# Patient Record
Sex: Female | Born: 1965 | Race: White | Hispanic: No | Marital: Married | Smoking: Former smoker
Health system: Southern US, Community
[De-identification: ages and names within clinical notes are randomized; demographics above are authoritative.]

## PROBLEM LIST (undated history)

## (undated) DIAGNOSIS — F419 Anxiety disorder, unspecified: Secondary | ICD-10-CM

## (undated) DIAGNOSIS — F329 Major depressive disorder, single episode, unspecified: Secondary | ICD-10-CM

## (undated) DIAGNOSIS — F32A Depression, unspecified: Secondary | ICD-10-CM

---

## 2002-11-03 HISTORY — PX: TUBAL LIGATION: SHX77

## 2002-11-26 ENCOUNTER — Ambulatory Visit (HOSPITAL_COMMUNITY): Admission: RE | Admit: 2002-11-26 | Discharge: 2002-11-26 | Payer: Self-pay | Admitting: Internal Medicine

## 2002-11-26 ENCOUNTER — Encounter: Payer: Self-pay | Admitting: Internal Medicine

## 2012-04-04 HISTORY — PX: OTHER SURGICAL HISTORY: SHX169

## 2012-04-28 ENCOUNTER — Inpatient Hospital Stay: Payer: Self-pay | Admitting: Orthopedic Surgery

## 2012-04-28 ENCOUNTER — Ambulatory Visit: Payer: Self-pay | Admitting: Orthopedic Surgery

## 2012-04-28 LAB — CBC
HCT: 39.8 % (ref 35.0–47.0)
MCH: 34.1 pg — ABNORMAL HIGH (ref 26.0–34.0)
RBC: 3.89 10*6/uL (ref 3.80–5.20)
WBC: 6.6 10*3/uL (ref 3.6–11.0)

## 2012-04-28 LAB — BASIC METABOLIC PANEL
BUN: 6 mg/dL — ABNORMAL LOW (ref 7–18)
Creatinine: 0.59 mg/dL — ABNORMAL LOW (ref 0.60–1.30)
EGFR (Non-African Amer.): >60
Glucose: 100 mg/dL — ABNORMAL HIGH (ref 65–99)
Potassium: 3.9 mmol/L (ref 3.5–5.1)
Sodium: 136 mmol/L (ref 136–145)

## 2012-04-28 IMAGING — CR RIGHT ANKLE - 2 VIEW
1 series · 2 of 2 positions shown · non-contrast
Comparison: none

REASON FOR EXAM: open tib fx
COMMENTS:

[Series 1: ap · 0.17mm/px · 2 of 2 slices shown]
[im 1/2]
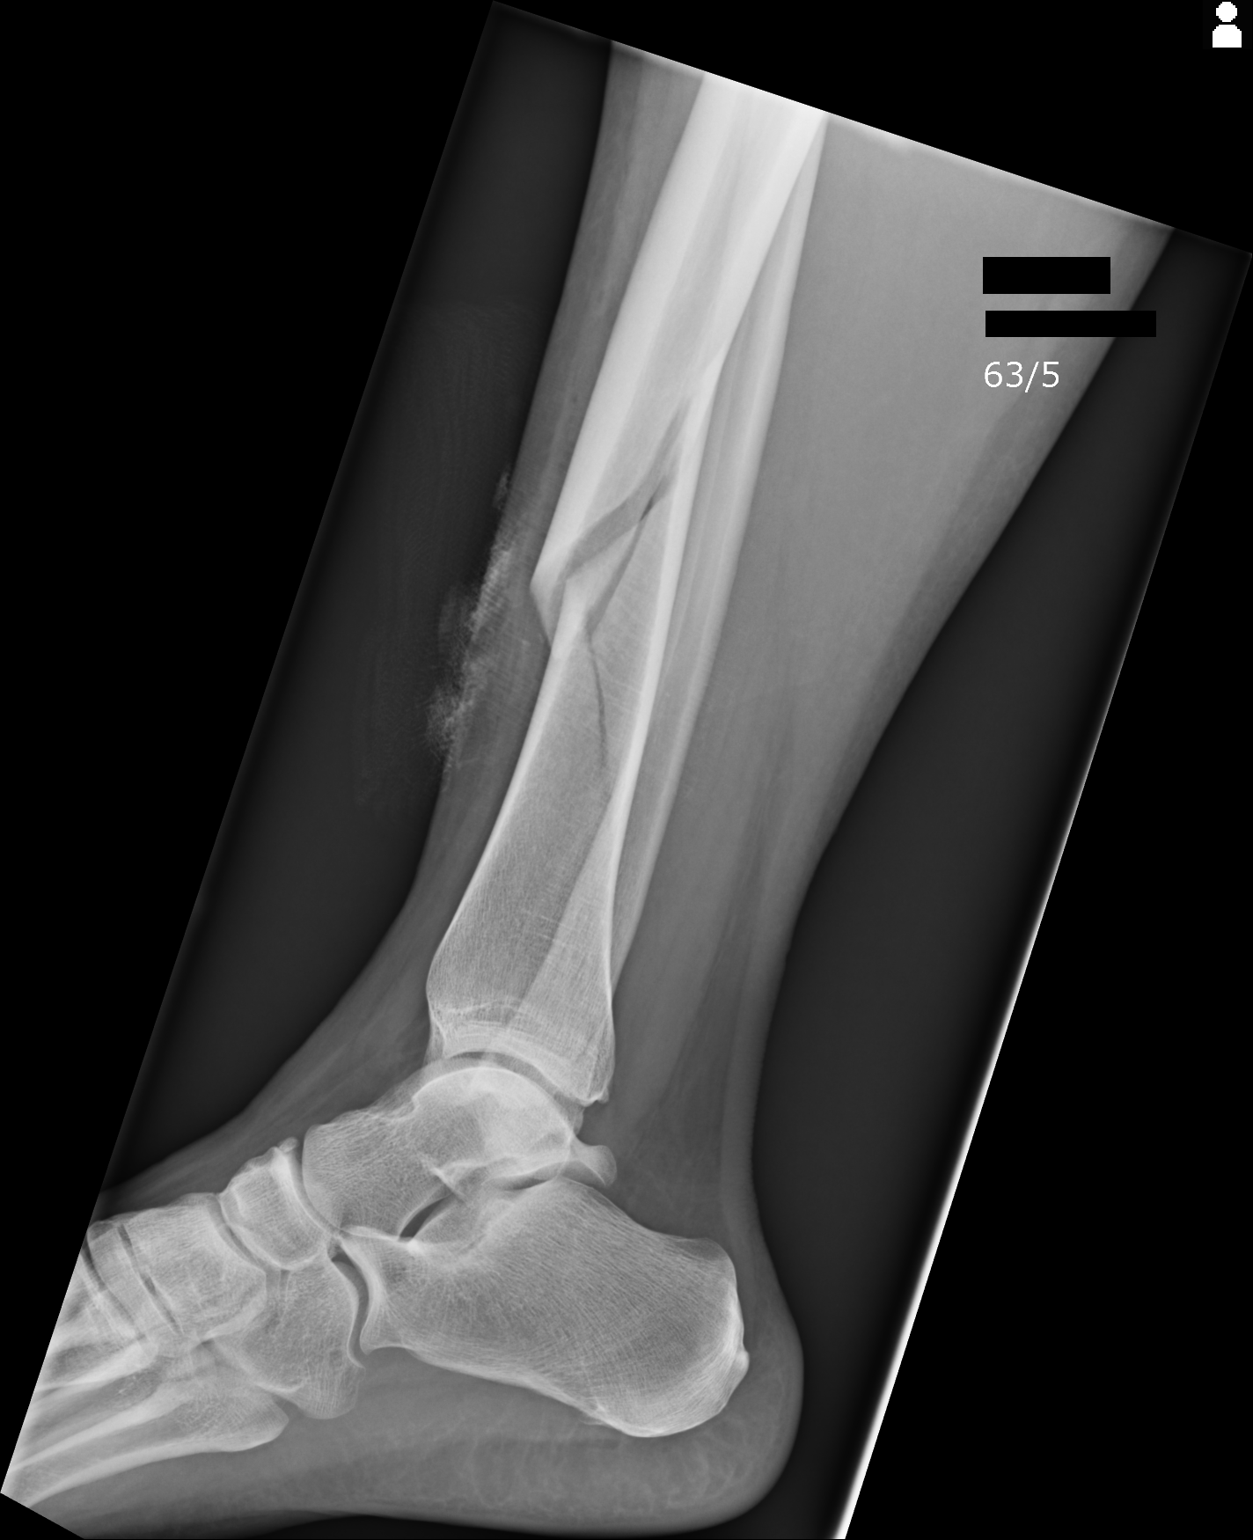
[im 2/2]
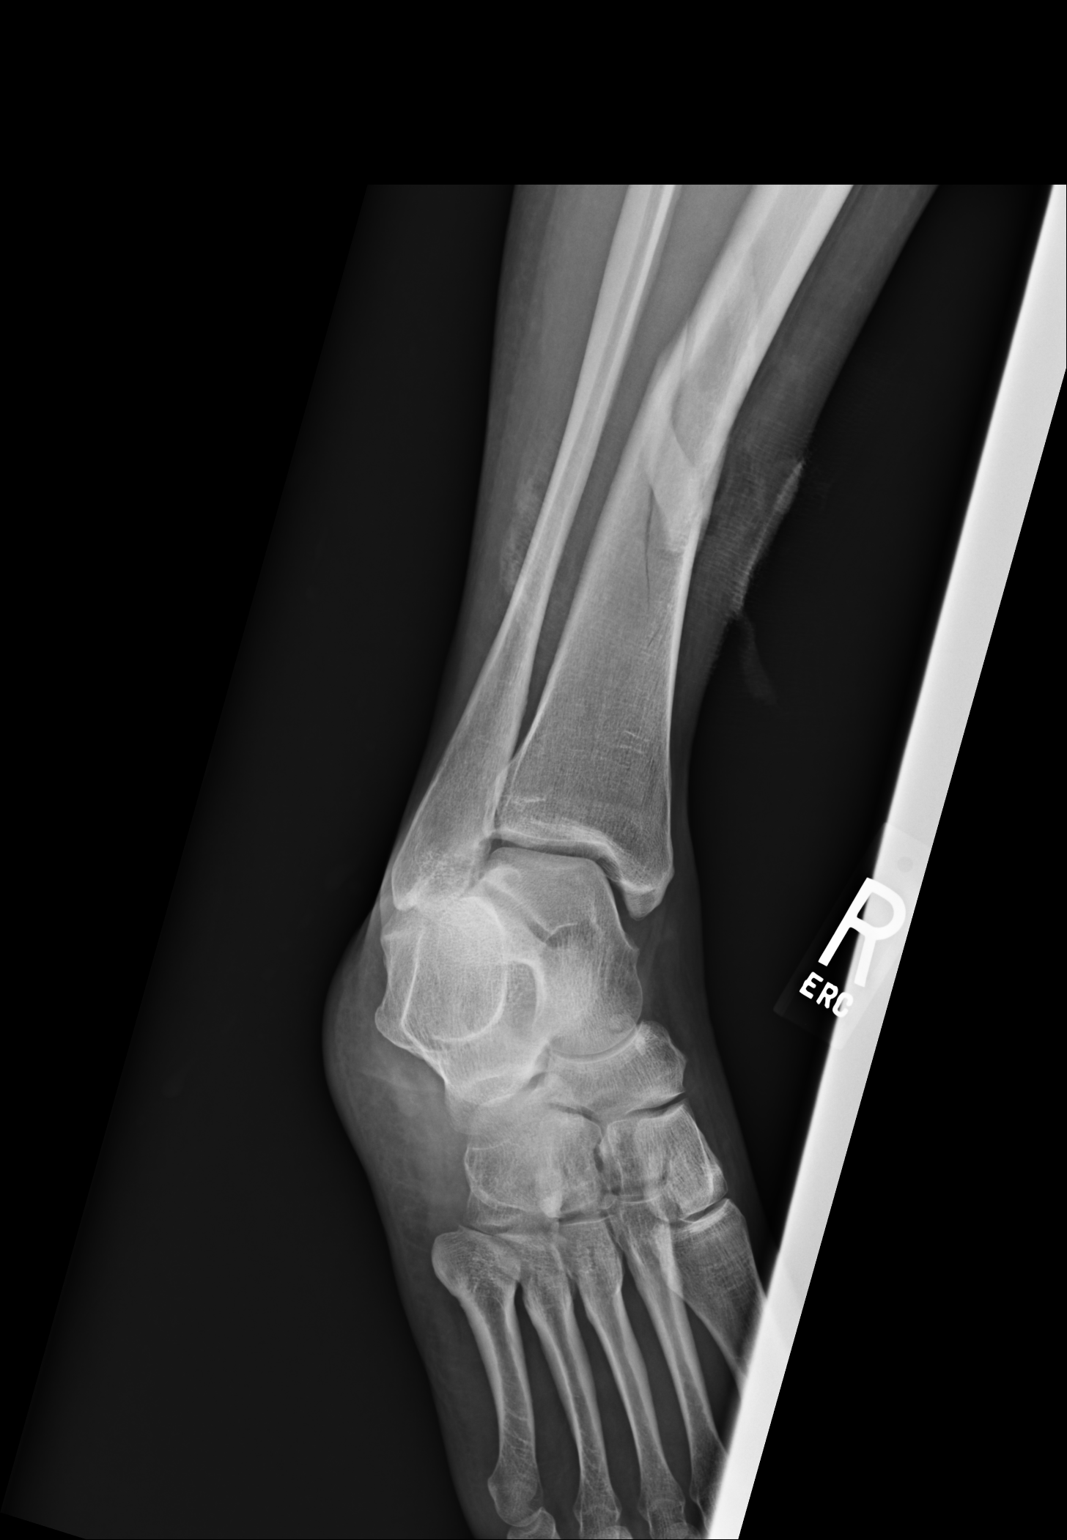

[2 of 2 positions shown; findings below may reference images not displayed]

PROCEDURE:     DXR - DXR ANKLE RIGHT AP AND LATERAL  - [DATE]  [DATE]

RESULT:     There is a spiral fracture in the distal third of the right
tibia which appears to extend into the distal tibia possibly to the
articular surface marginally on the lateral view. There is no dislocation.
There is slight lateral angulation. There is posterior displacement at the
fracture site with slight overriding.
IMPRESSION: Complex spiral fracture in the distal right tibia.

[REDACTED]

## 2012-04-28 IMAGING — CR RIGHT TIBIA AND FIBULA - 2 VIEW
1 series · 2 of 2 positions shown · non-contrast
Comparison: none

REASON FOR EXAM: post reduction
COMMENTS:   Bedside (portable):Y

[Series 1: ap · 0.17mm/px · 2 of 2 slices shown]
[im 1/2]
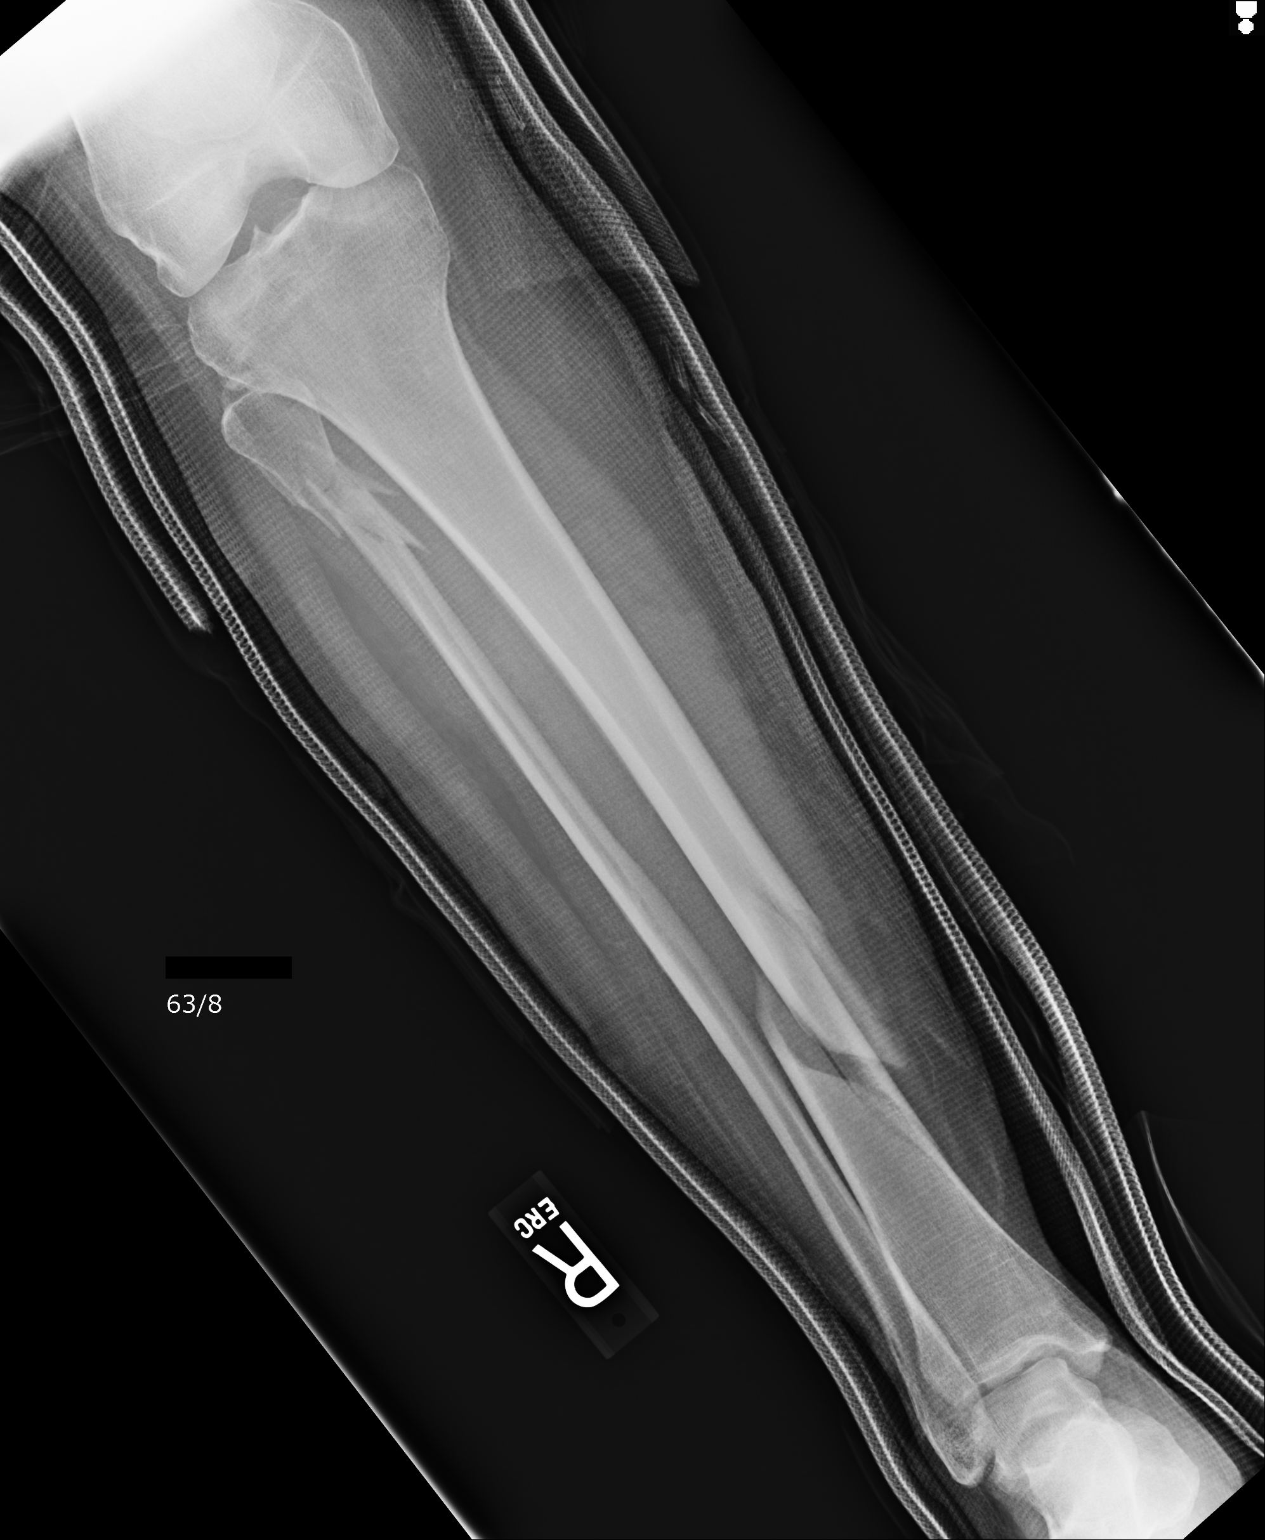
[im 2/2]
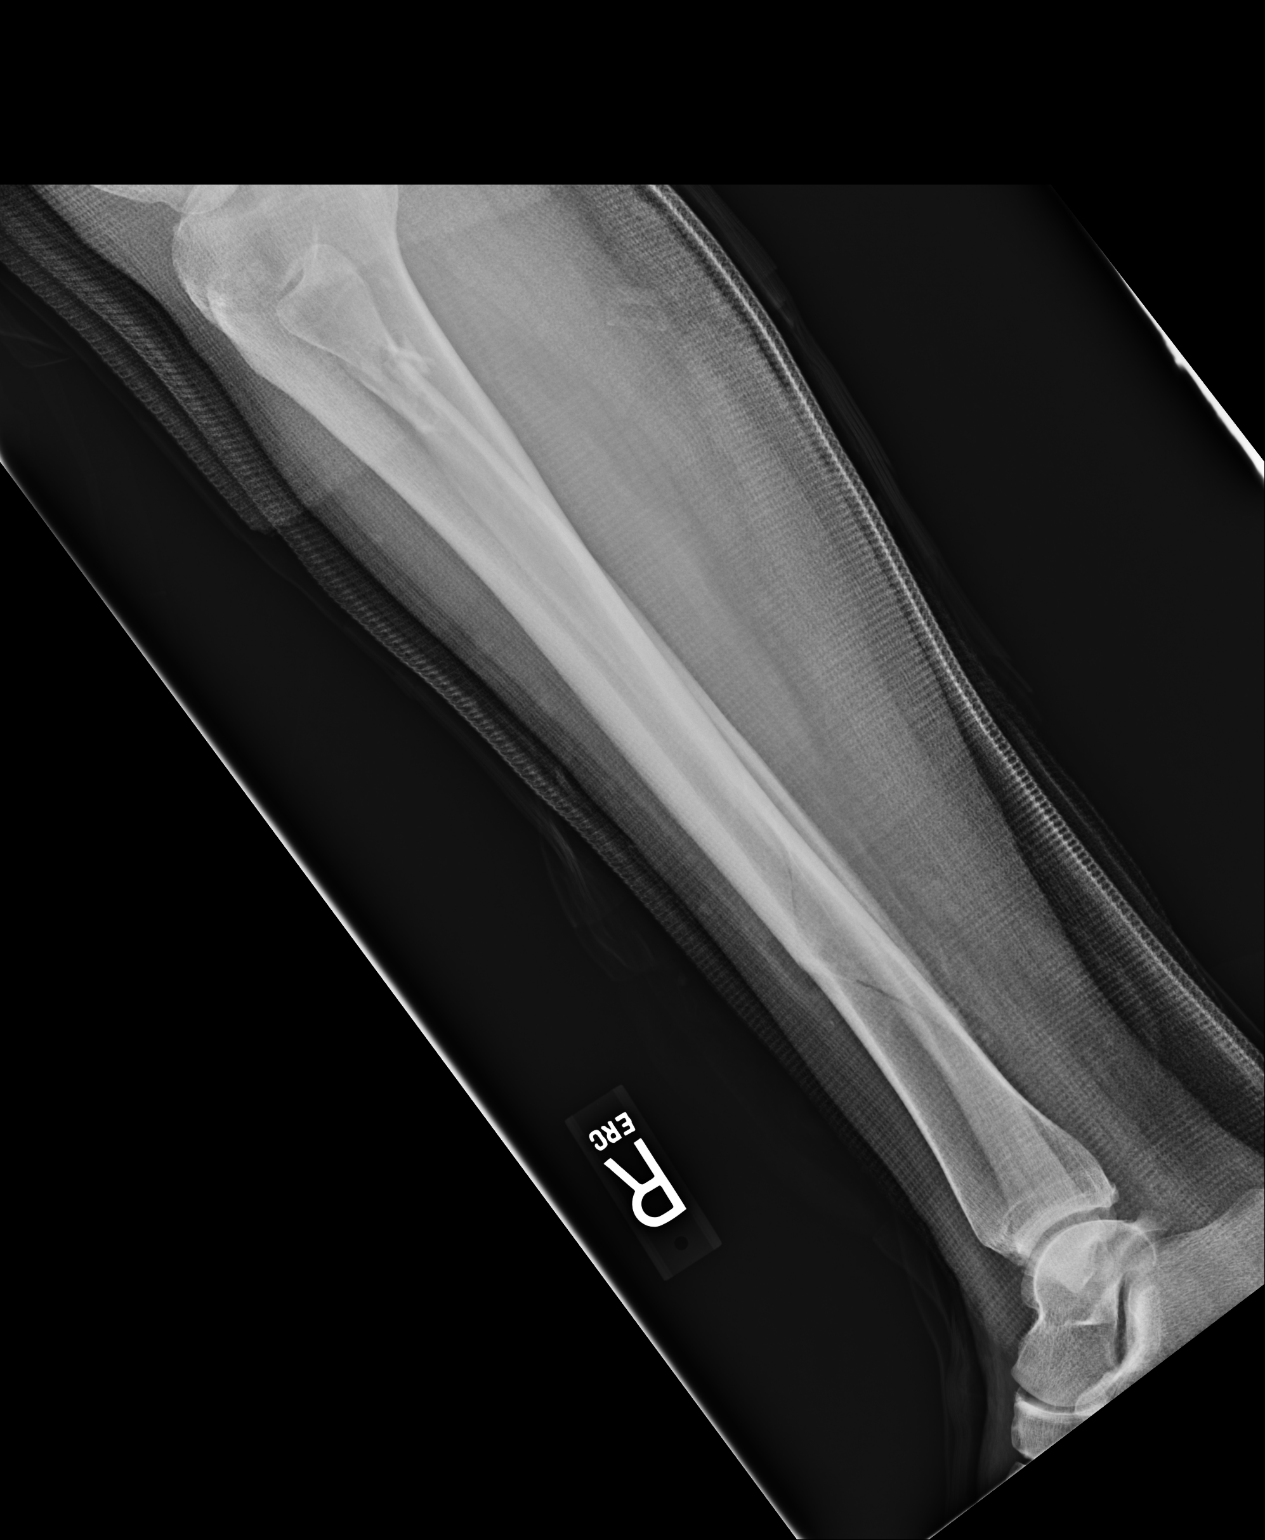

[2 of 2 positions shown; findings below may reference images not displayed]

PROCEDURE:     DXR - DXR TIBIA AND FIBULA RT (LOWER L  - [DATE] [DATE]

RESULT:     Casting material has been applied of the right lower extremity.
There is slight lateralization of one half shaft width of the distal tibial
component at the major fracture site. Anterior-posterior alignment appears
maintained compared to the prereduction images.
IMPRESSION: Slight offset in the AP projection at the tibial fracture
site. Good alignment achieved on the lateral view.

[REDACTED]

## 2012-04-28 IMAGING — CR RIGHT TIBIA AND FIBULA - 2 VIEW
1 series · 2 of 2 positions shown · non-contrast
Comparison: none

REASON FOR EXAM: PORTABLE: deformity, pain s/p fall
COMMENTS:

[Series 1: ap · 0.17mm/px · 2 of 2 slices shown]
[im 1/2]
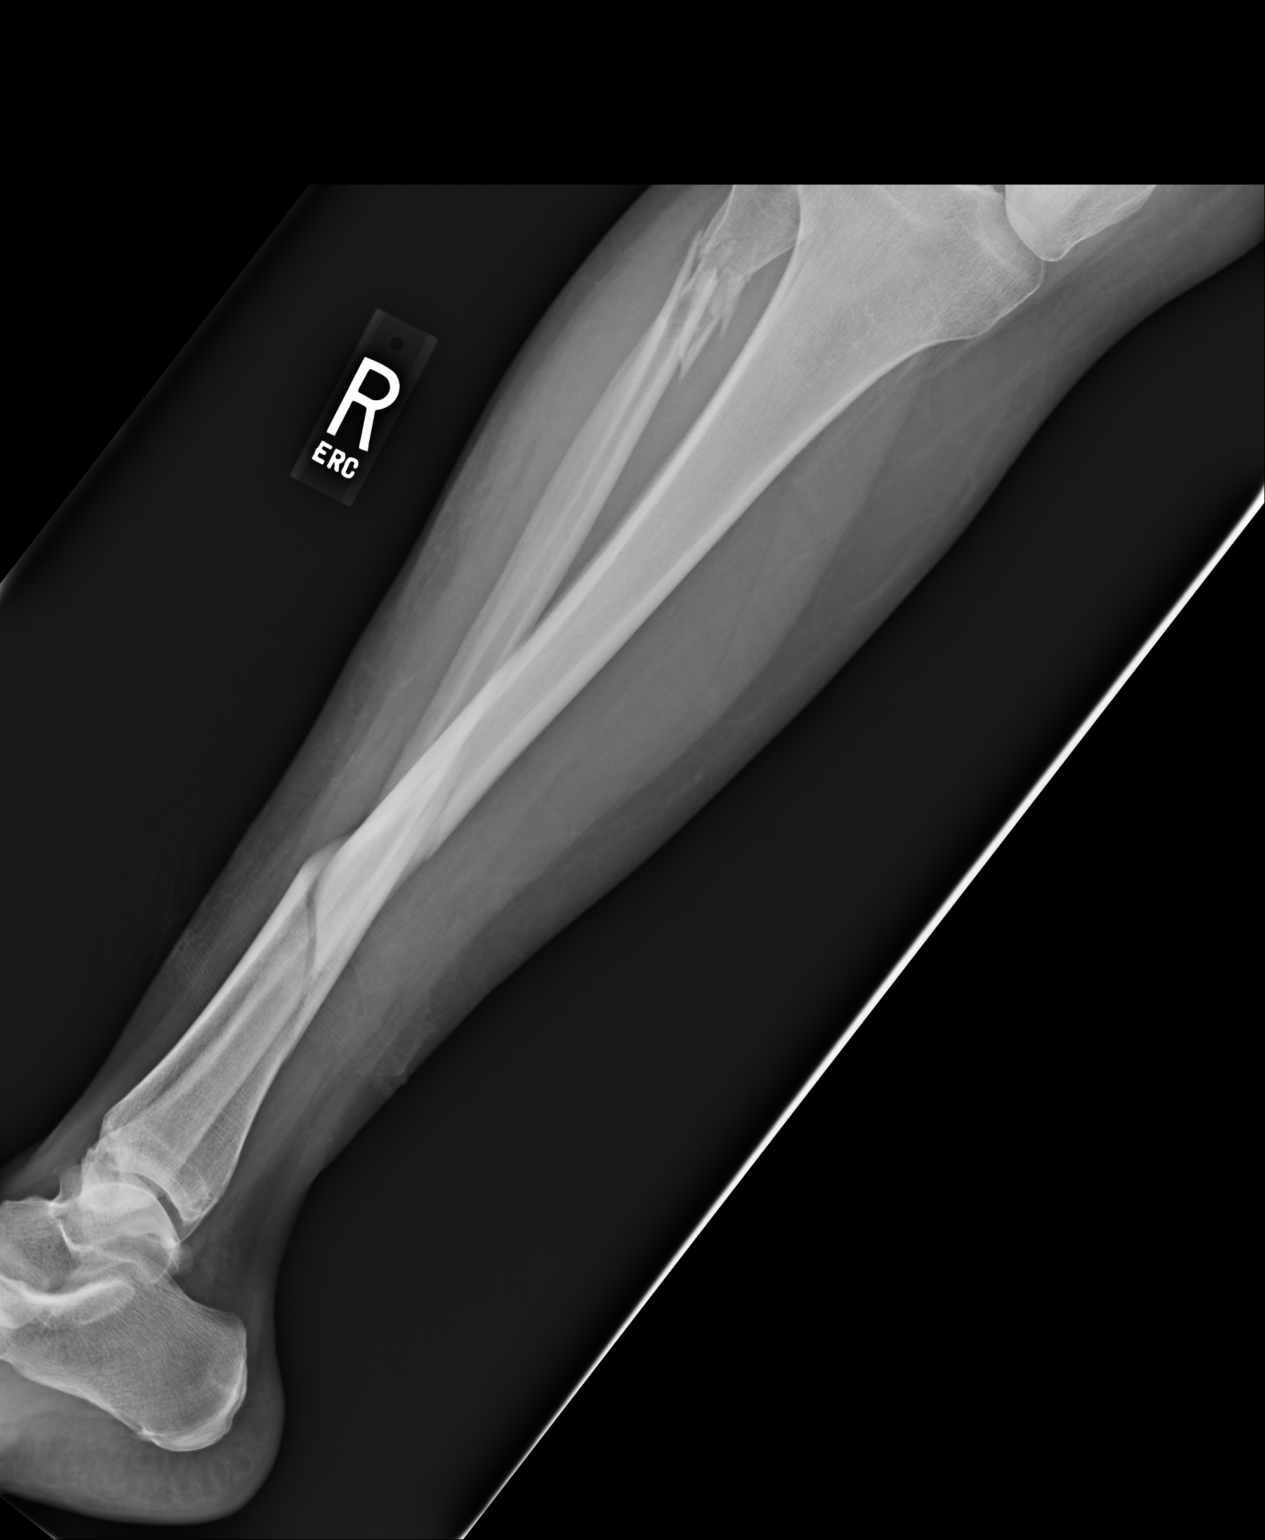
[im 2/2]
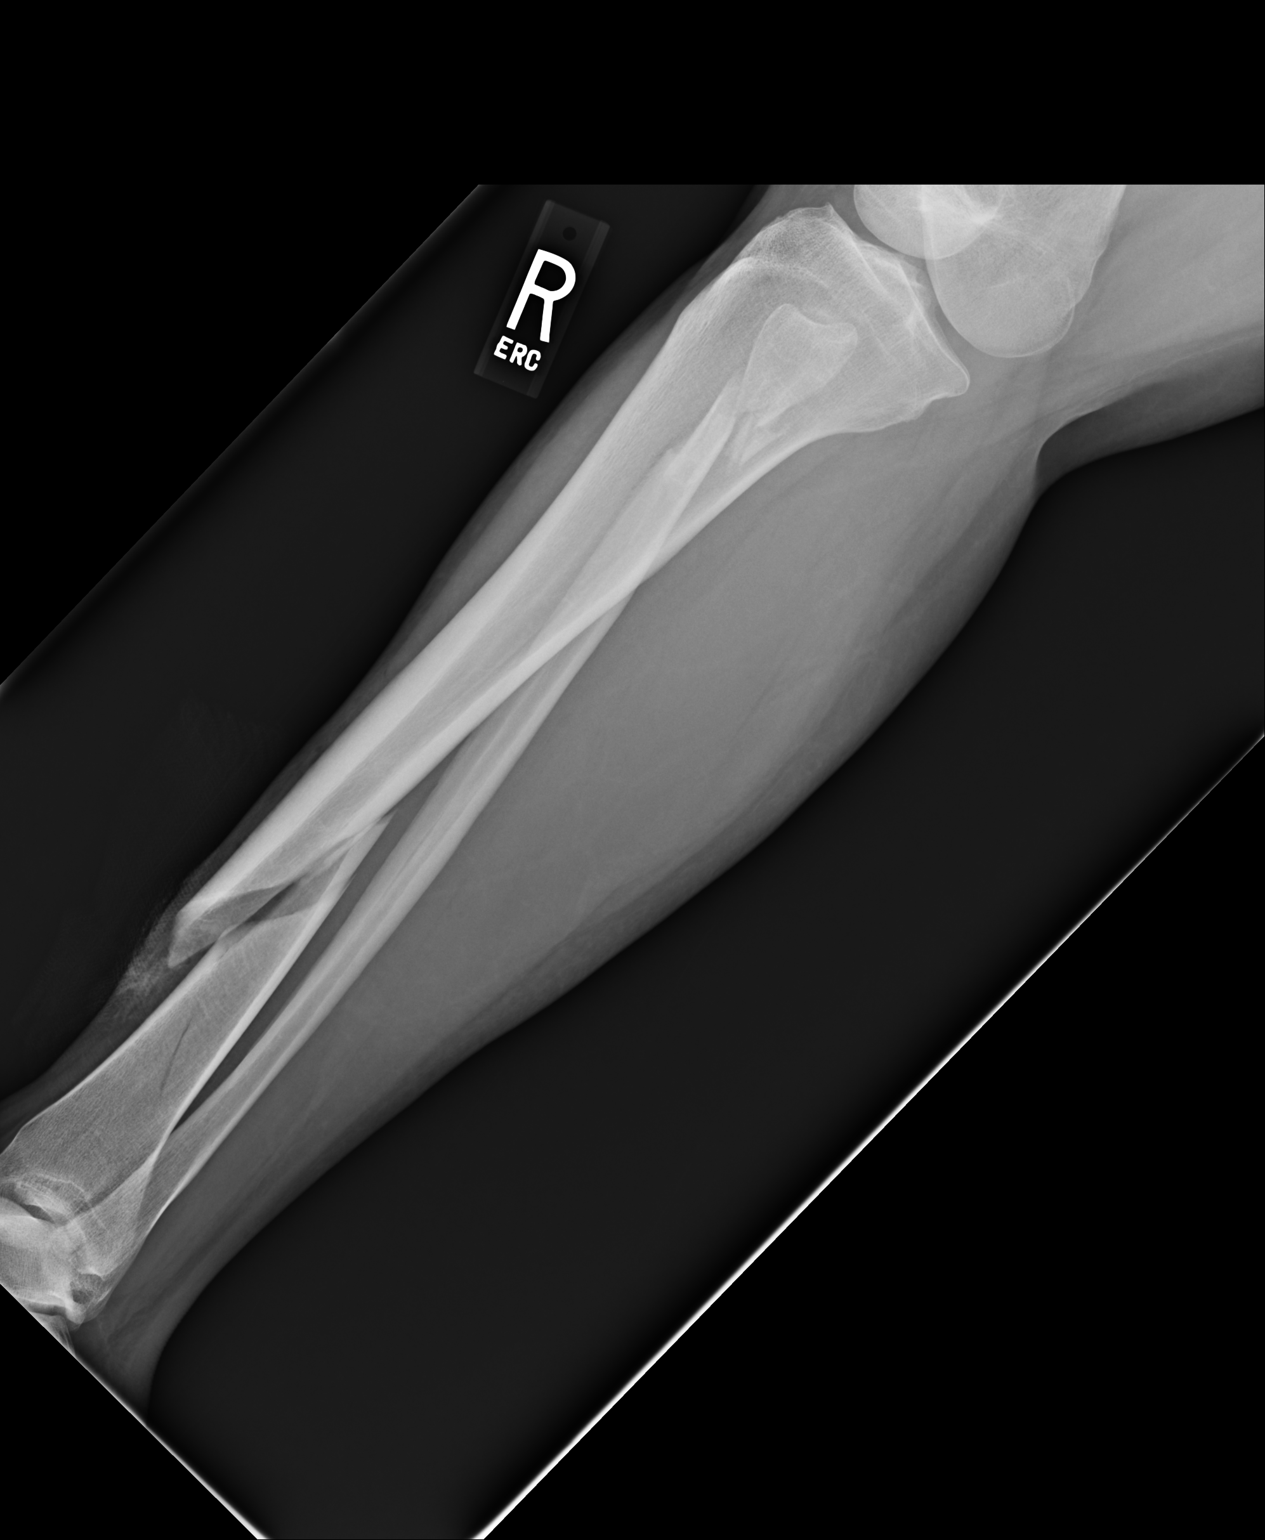

[2 of 2 positions shown; findings below may reference images not displayed]

RESULT:     Images of the tibia and fibula show a spiral fracture in the
distal third of the right tibia and an oblique comminuted fracture in the
proximal fibula. There is distraction and overriding of the tibial
component. The foot is in the lateral position while the knees AP. The ankle
does not appear to be dislocated area the knee does not appear dislocated.
IMPRESSION: Spiral fracture in the distal third of the right tibia
which may extend to the ankle joint. Comminuted fracture in the proximal
right fibula. Orthopedic surgical consultation is necessary.

[REDACTED]

## 2012-04-28 IMAGING — CR DG CHEST 1V PORT
1 series · 1 of 1 positions shown · non-contrast
Comparison: none

REASON FOR EXAM: fall, open tib fx
COMMENTS:

PROCEDURE:     DXR - DXR PORTABLE CHEST SINGLE VIEW  - [DATE]  [DATE]
RESULT:     The lungs are clear. The heart and pulmonary vessels are normal.
The bony and mediastinal structures are unremarkable. There is no effusion.
There is no pneumothorax or evidence of congestive failure.

[ap]
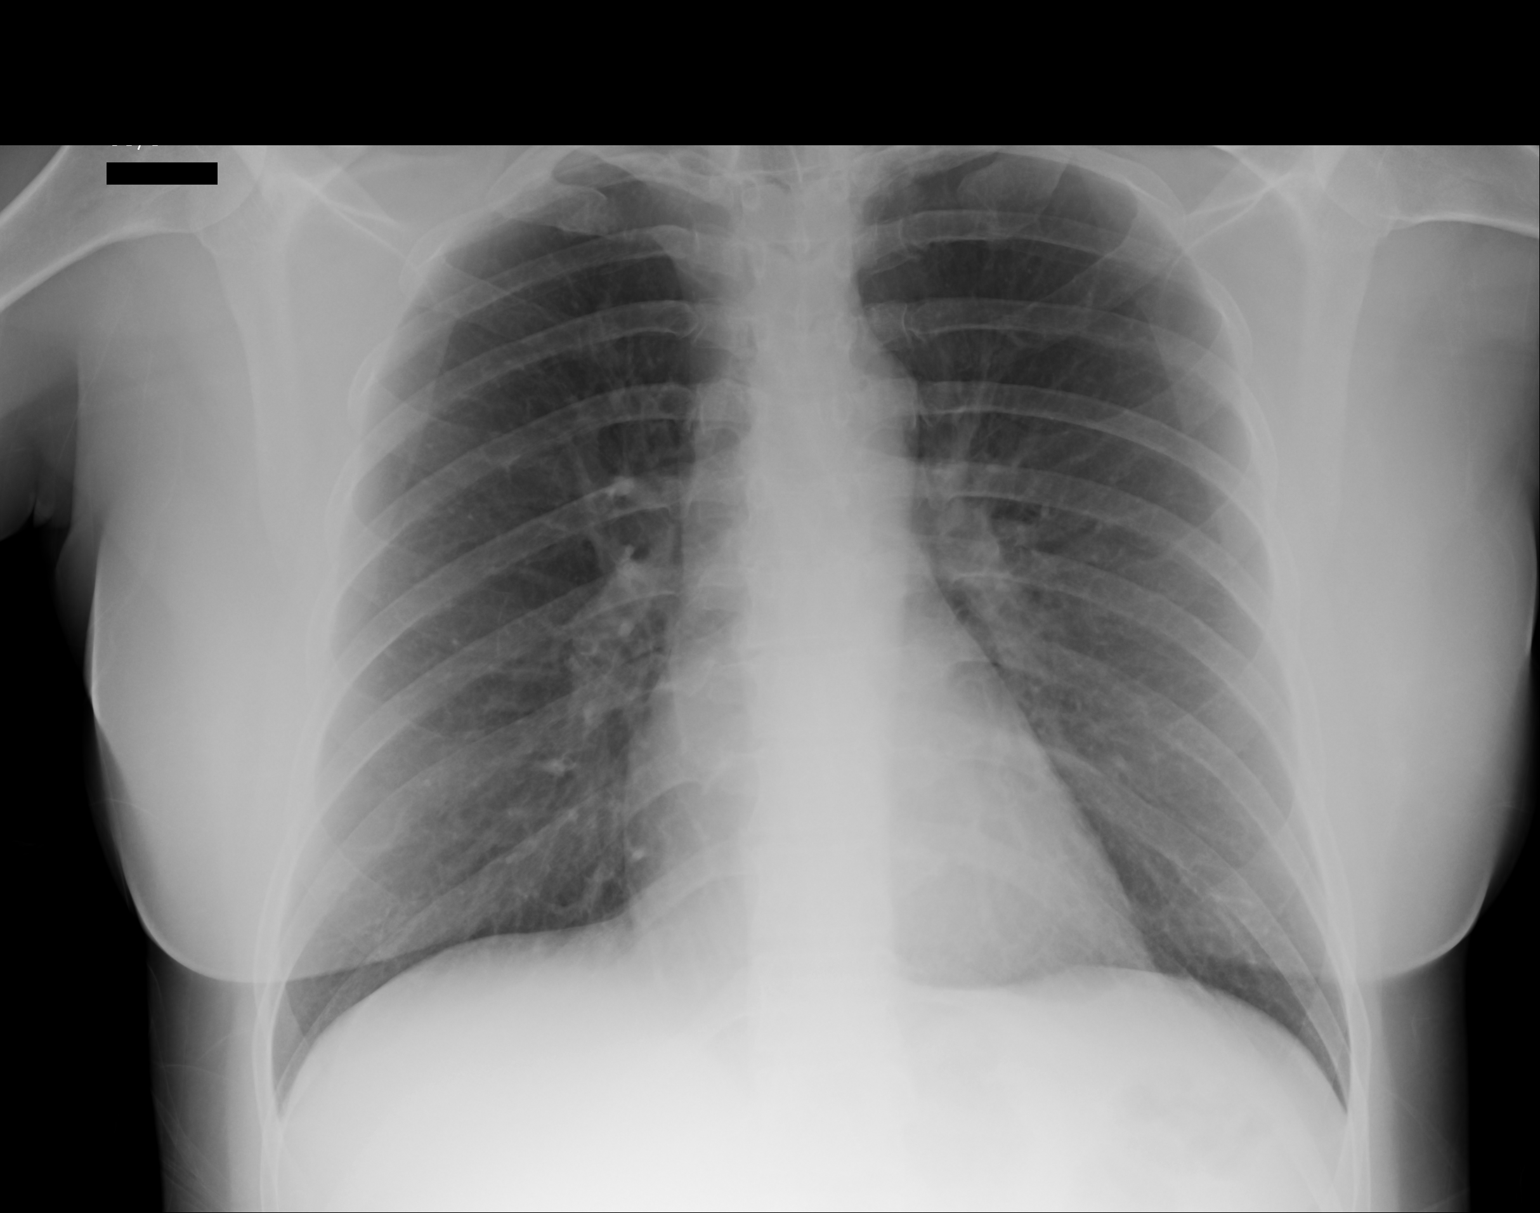

[1 of 1 positions shown; findings below may reference images not displayed]

IMPRESSION: No acute cardiopulmonary disease.

[REDACTED]

## 2012-04-28 IMAGING — CR DG KNEE 1-2V*R*
1 series · 3 of 3 positions shown · non-contrast
Comparison: none

REASON FOR EXAM: fall, popen tib fx
COMMENTS:

PROCEDURE:     DXR - DXR KNEE RIGHT AP AND LATERAL  - [DATE]  [DATE]
RESULT:     Right knee images demonstrate fracture in the proximal right
fibula with an oblique alignment. The tibia appears intact. The distal femur
and patella appear intact.

[Series 1: ap · 0.17mm/px · 3 of 3 slices shown]
[im 1/3]
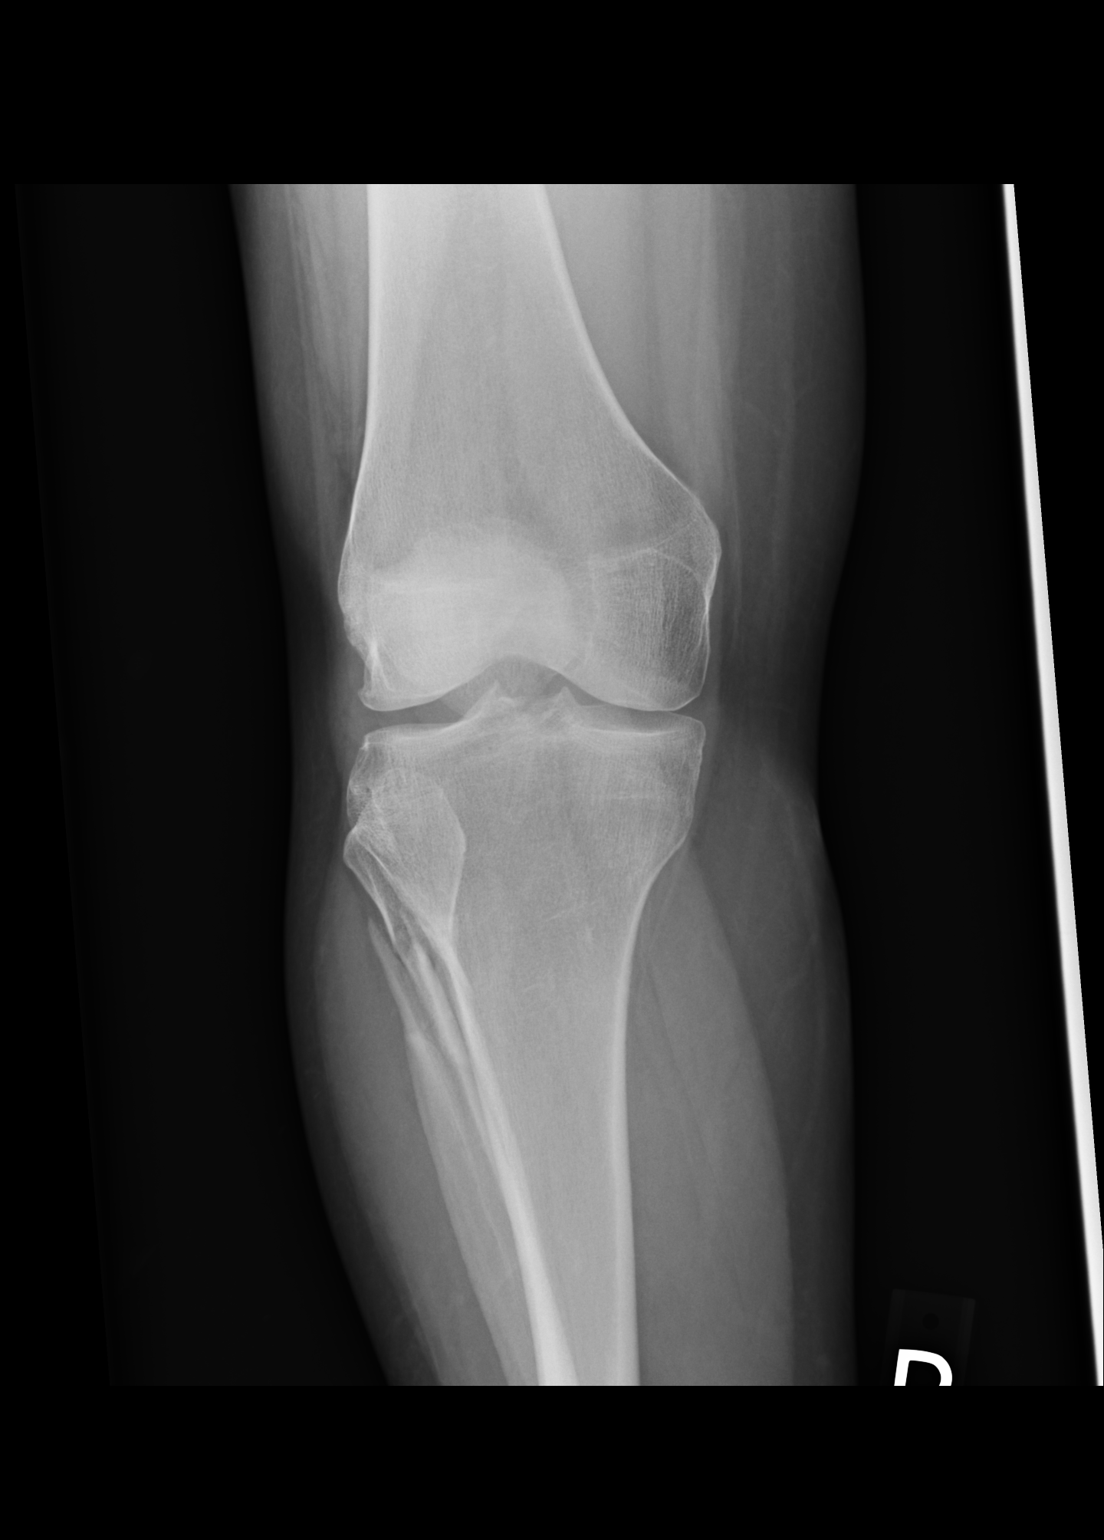
[im 2/3]
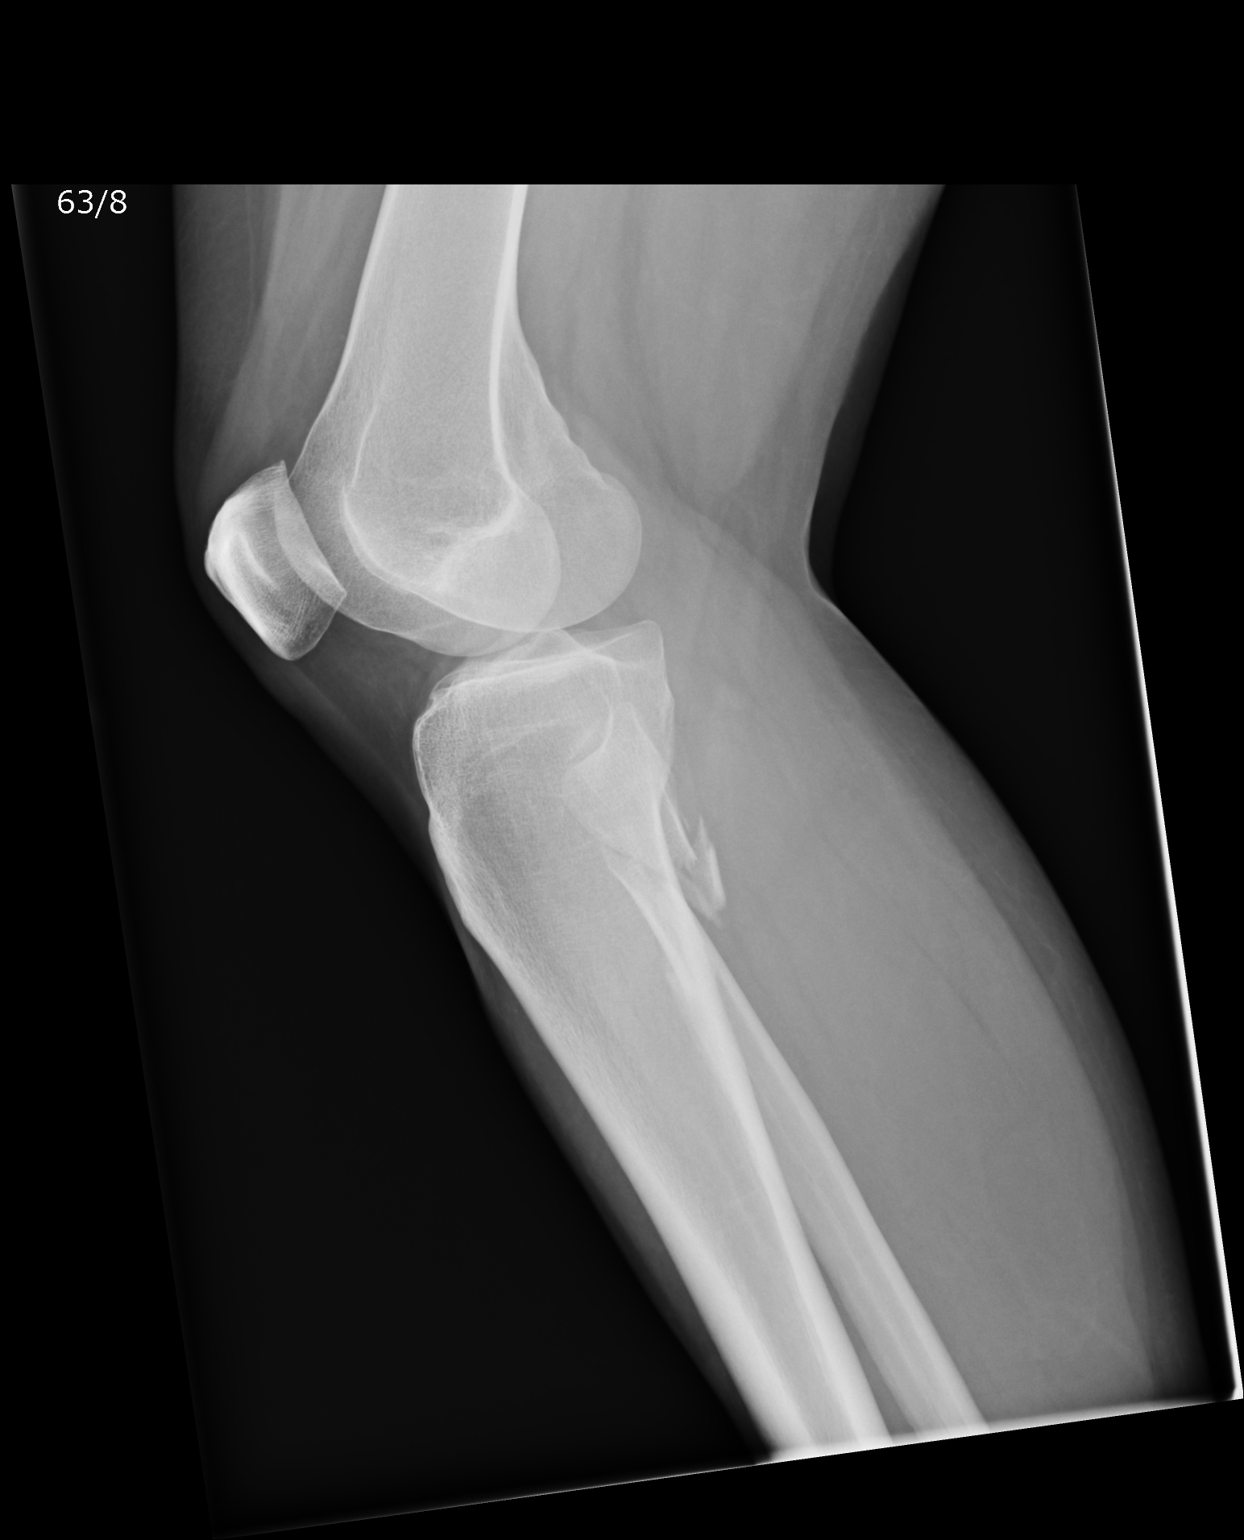
[im 3/3]
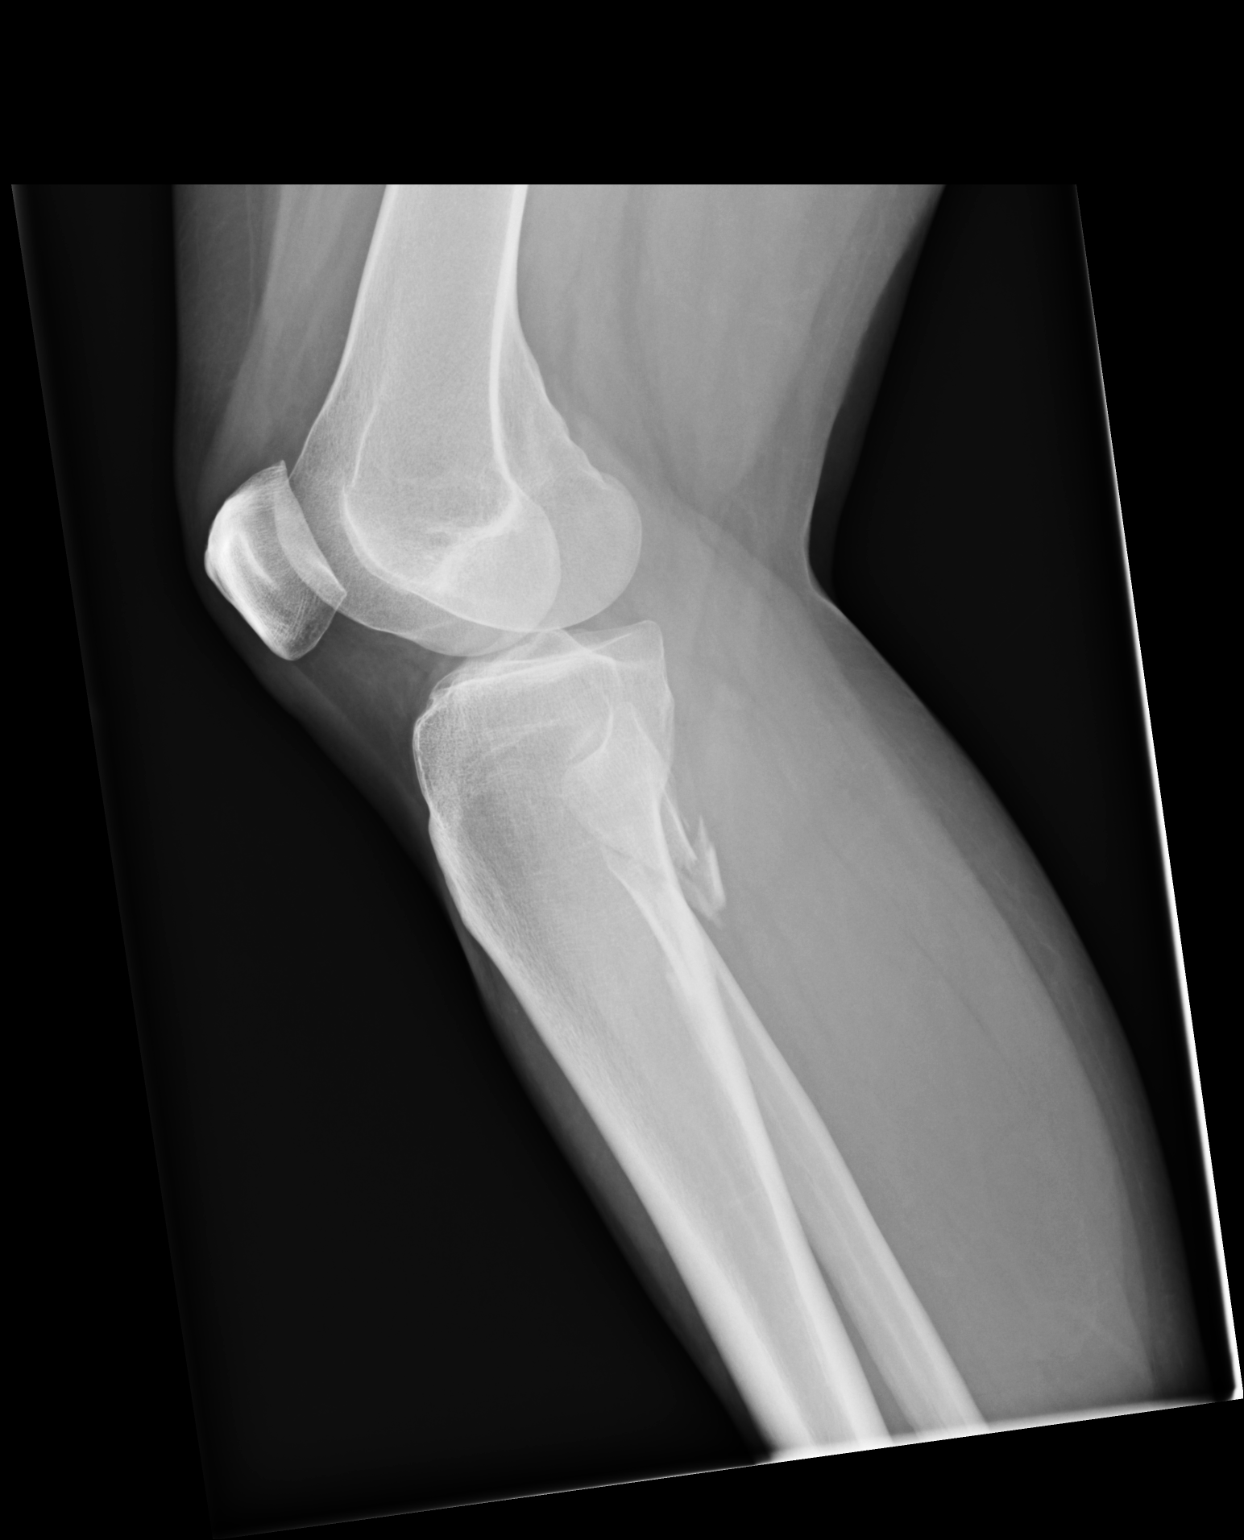

[3 of 3 positions shown; findings below may reference images not displayed]

IMPRESSION: Oblique fracture through the proximal right fibula.

[REDACTED]

## 2012-04-29 LAB — HEMOGLOBIN: HGB: 11.6 g/dL — ABNORMAL LOW (ref 12.0–16.0)

## 2012-04-29 IMAGING — CR RIGHT TIBIA AND FIBULA - 2 VIEW
1 series · 3 of 3 positions shown · non-contrast
Comparison: none

REASON FOR EXAM: post op
COMMENTS:   Bedside (portable):Y

[Series 1: ap · 0.17mm/px · 3 of 3 slices shown]
[im 1/3]
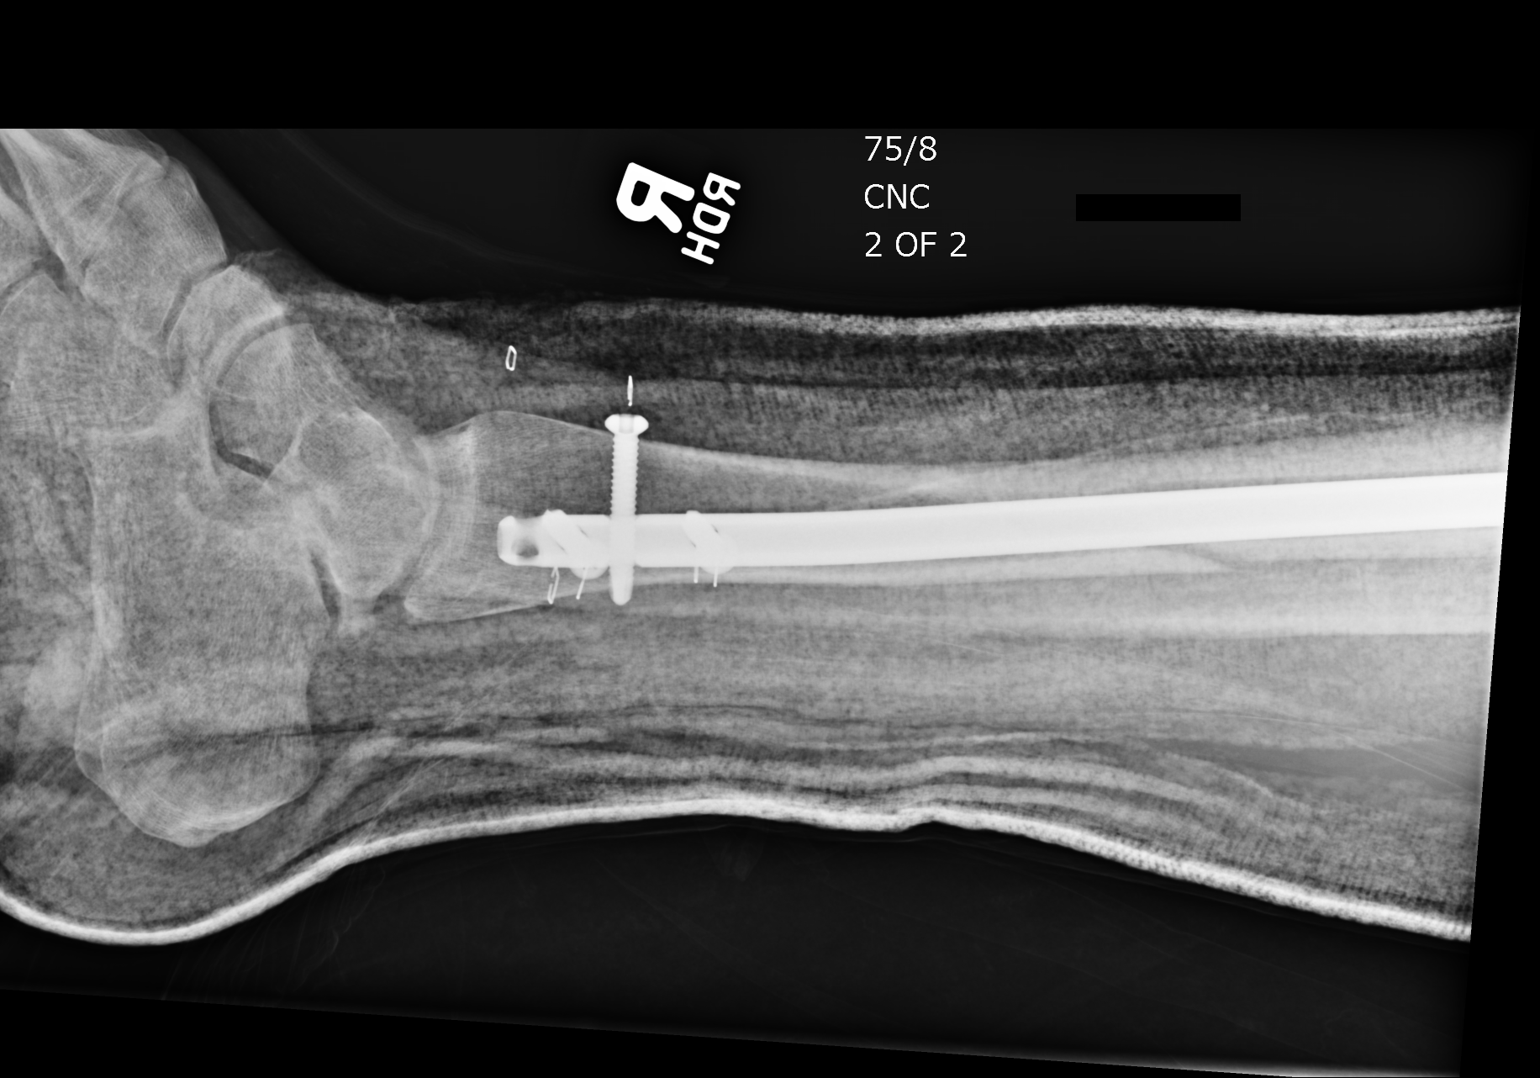
[im 2/3]
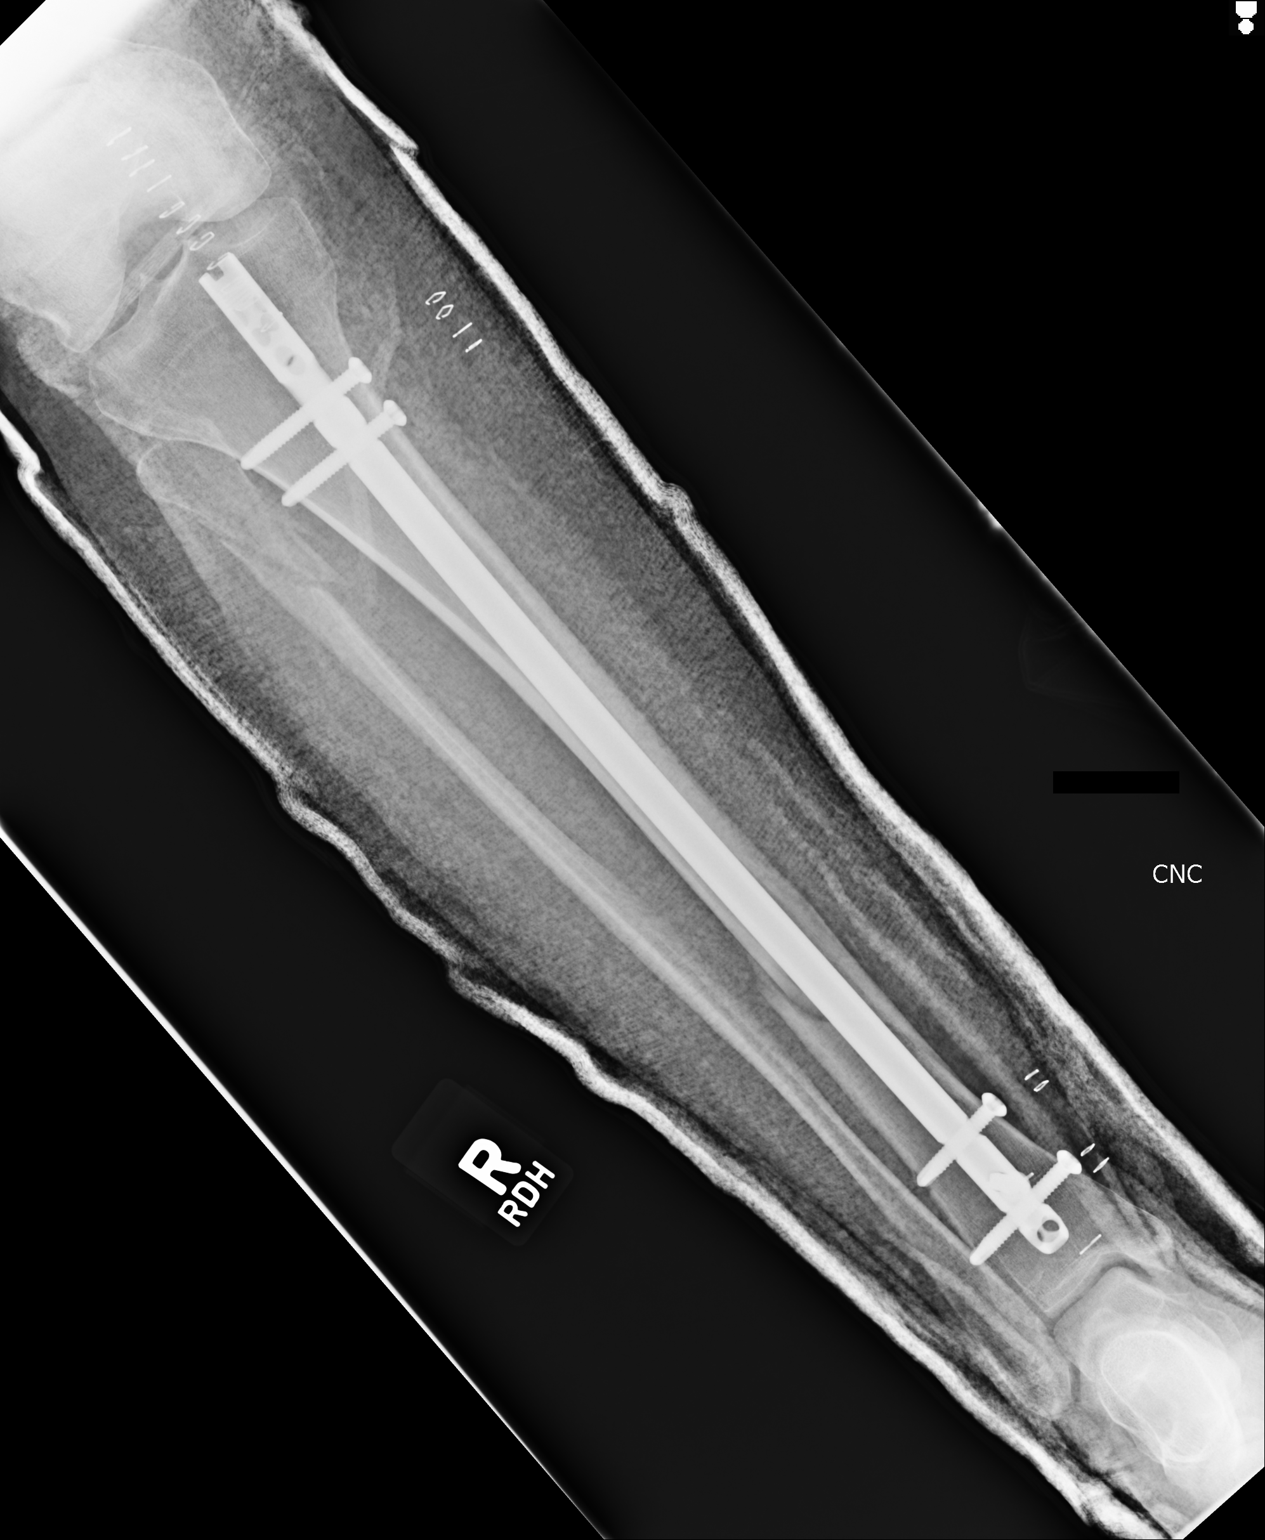
[im 3/3]
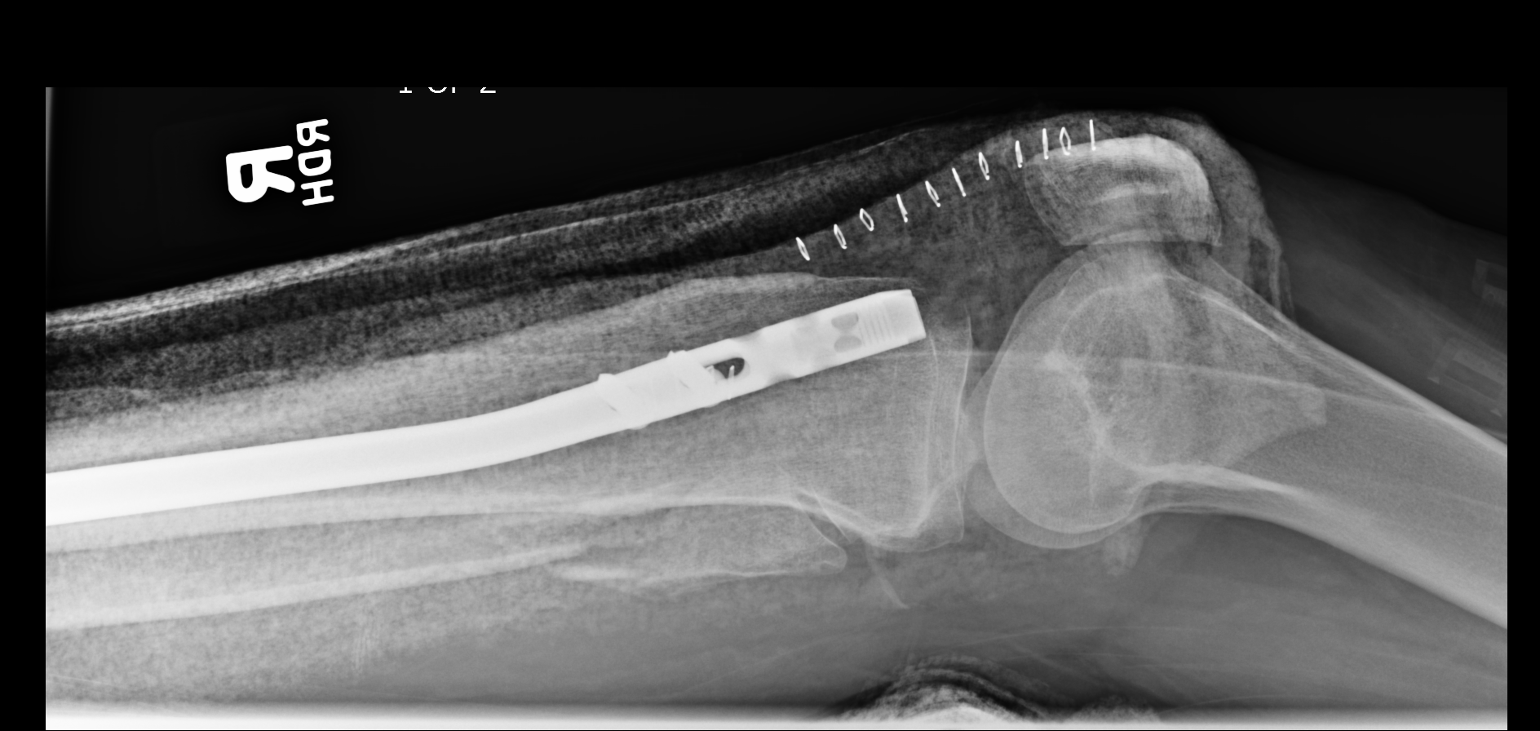

[3 of 3 positions shown; findings below may reference images not displayed]

PROCEDURE:     DXR - DXR TIBIA AND FIBULA RT (LOWER L  - [DATE] [DATE]

RESULT:     Images demonstrate an intramedullary rod in the right femur
traversing the fracture site. Casting material is present. Alignment appears
anatomic. Proximal fibular fracture is noted. There is no evidence of
widening in the posterior malleolus fracture.
IMPRESSION: Please see above.

[REDACTED]

## 2012-04-29 IMAGING — CR DG C-ARM 1-60 MIN
1 series · 7 of 7 positions shown · non-contrast
Comparison: none

REASON FOR EXAM: ORIF right tibia
COMMENTS:

[Series 6001: (person_name),(person_name) · 7 of 7 slices shown]
[im 1/7]
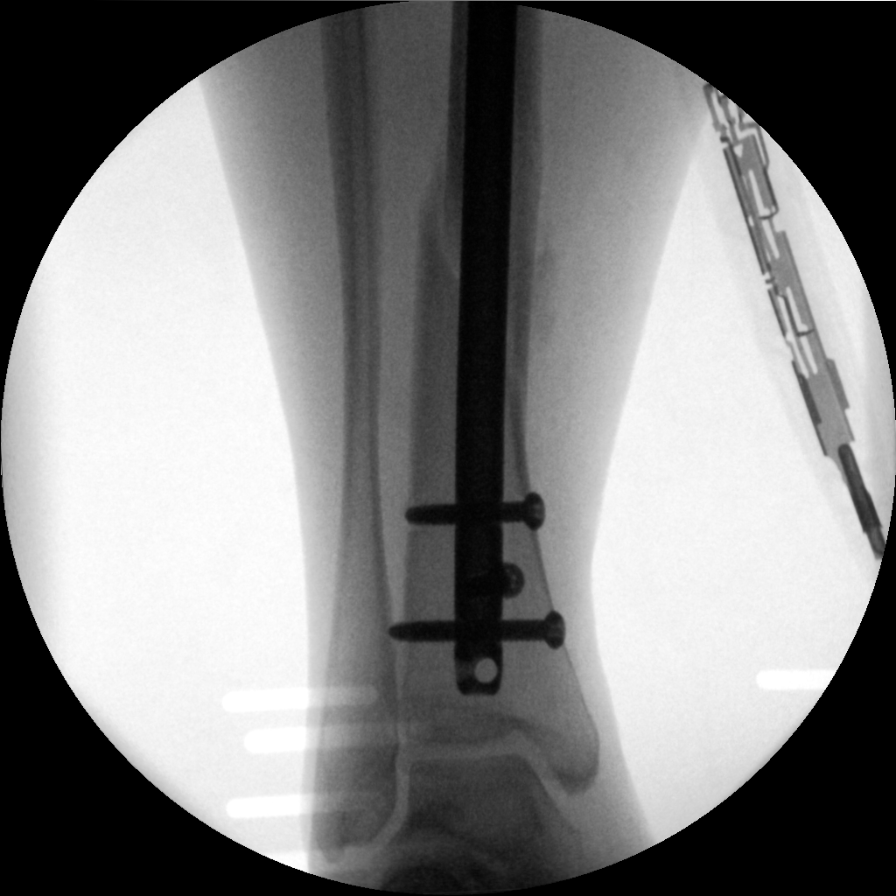
[im 2/7]
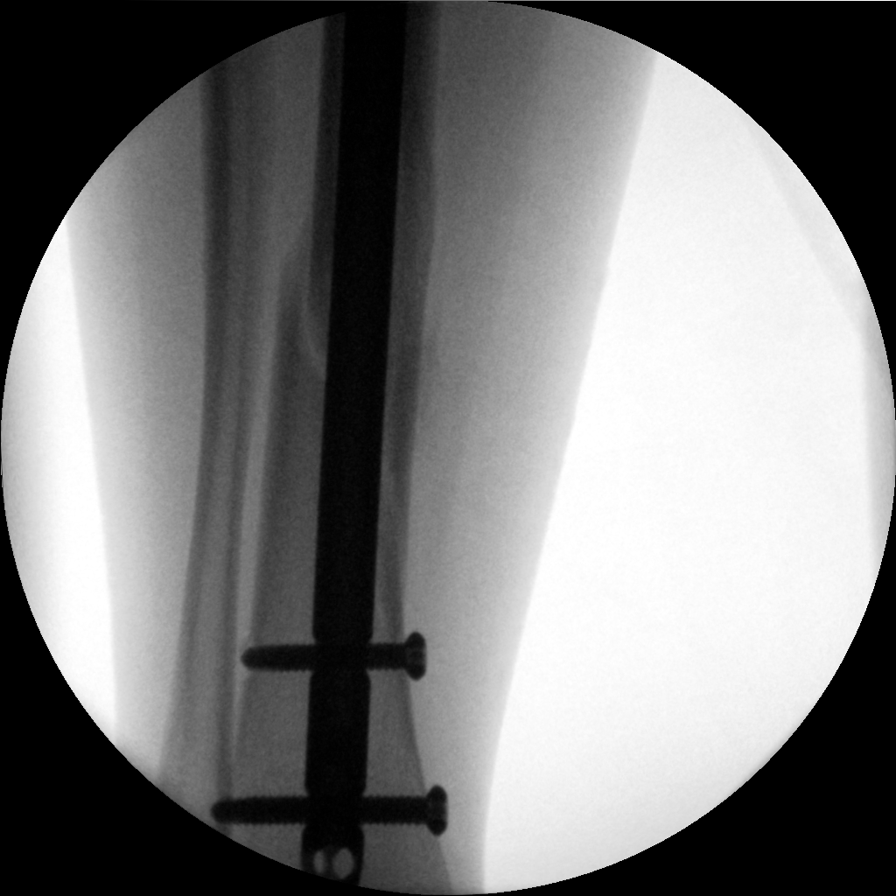
[im 3/7]
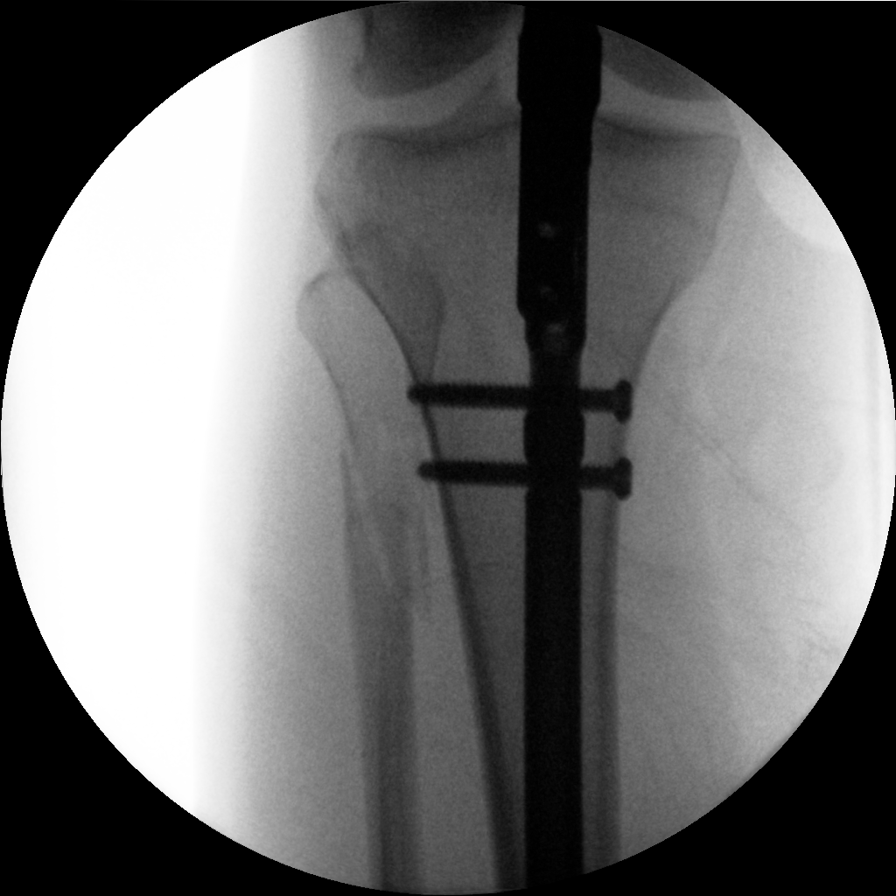
[im 4/7]
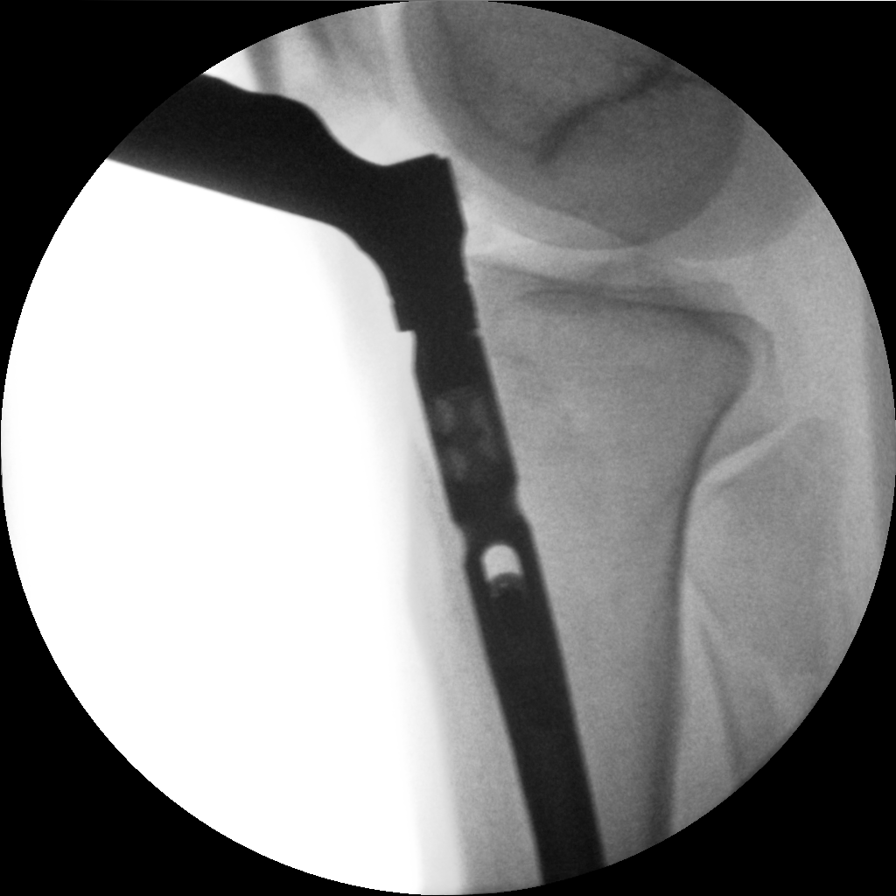
[im 5/7]
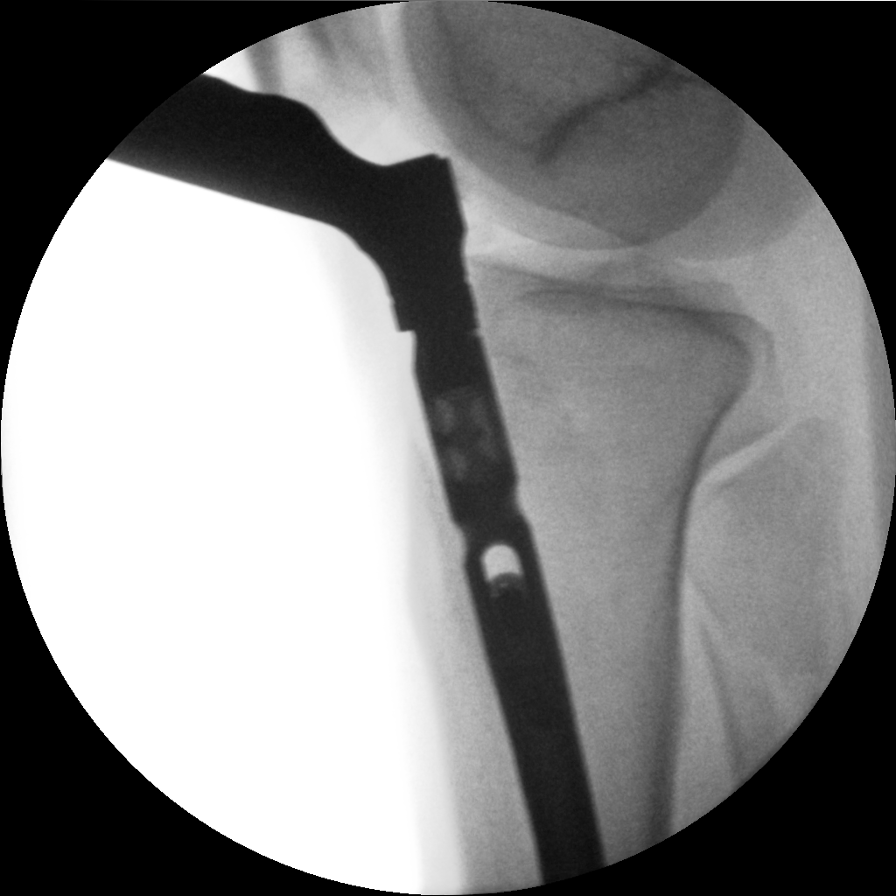
[im 6/7]
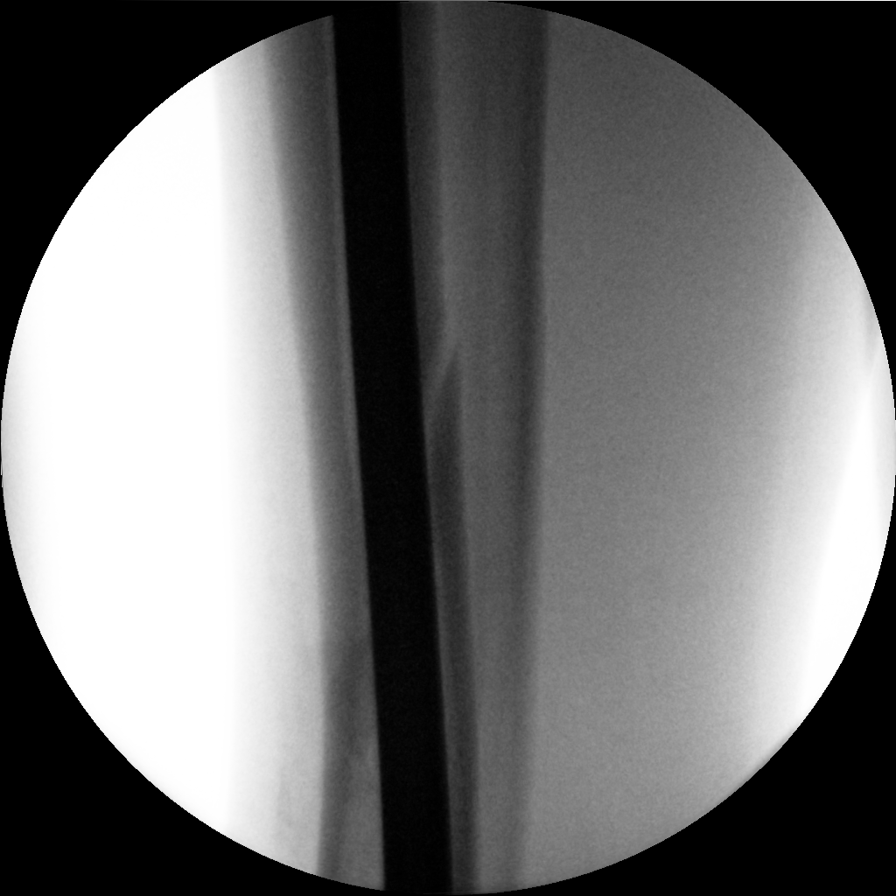
[im 7/7]
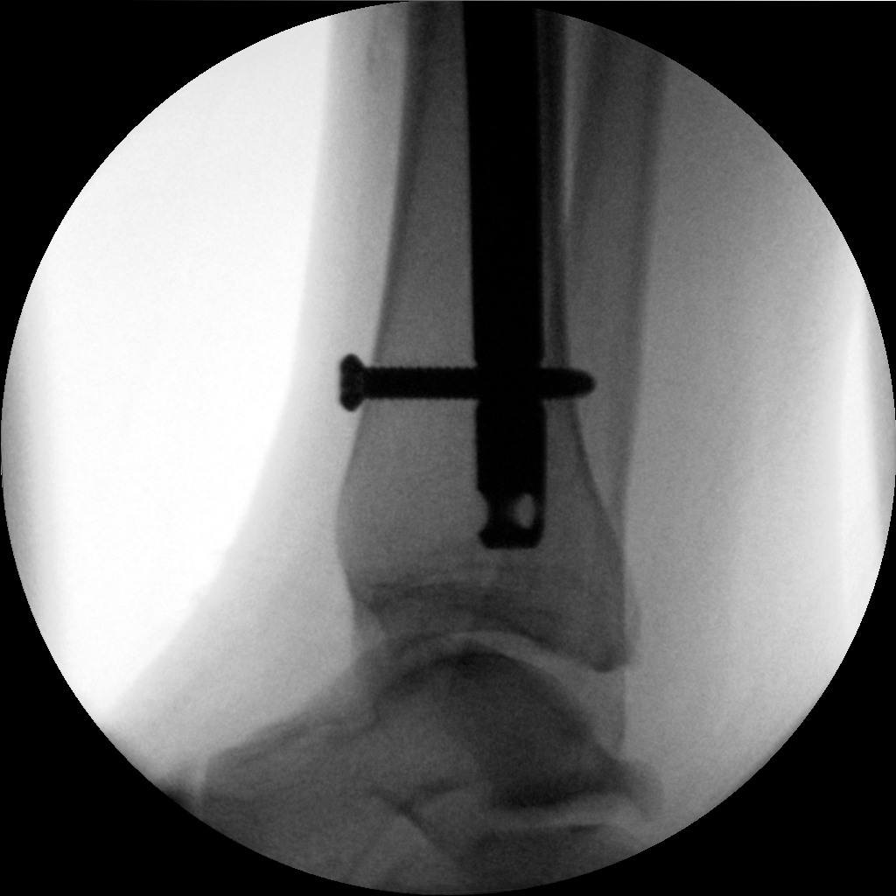

[7 of 7 positions shown; findings below may reference images not displayed]

PROCEDURE:     DXR - DXR C-ARM WITH SPOT IMAGES  - [DATE] [DATE]

RESULT:     C-arm images demonstrate an intramedullary rod has been placed
crossing the spiral fracture in the distal tibia. Fixation screws are
present distally and proximally. These images show no definite bone or
hardware complication. There are 2 transverse fixation screws in the distal
rod and one anterior to posterior screw in the distal right. Two transverse
screws are seen in the proximal rod.
IMPRESSION: ORIF of the tibial fracture.

[REDACTED]

## 2012-04-29 IMAGING — CT CT OF THE RIGHT ANKLE WITHOUT CONTRAST
1 series · 12 of 14 positions shown, 15 images · non-contrast
Comparison: none

REASON FOR EXAM: ankle evaluation
COMMENTS:

[Series 3: axial · axial · 0.39mm/px · z∈[-222,-92]mm · 12 of 154 slices shown, 15 images]
[im 12/154  soft-tissue]
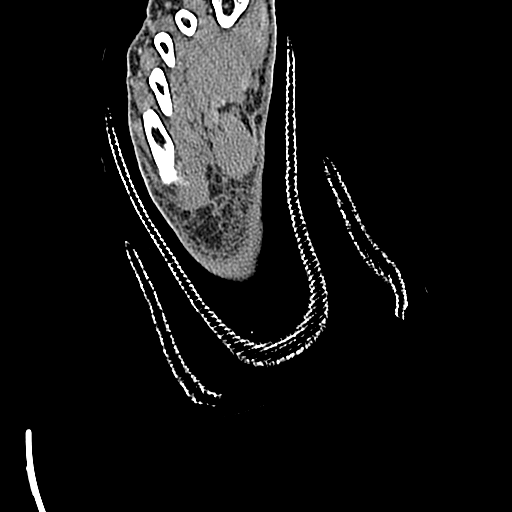
[im 12/154  bone]
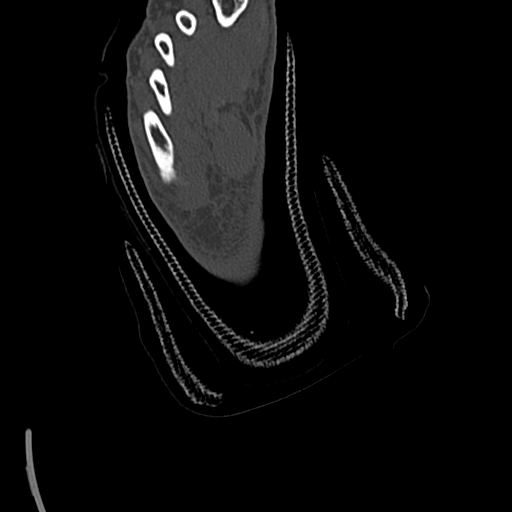
[im 24/154  bone]
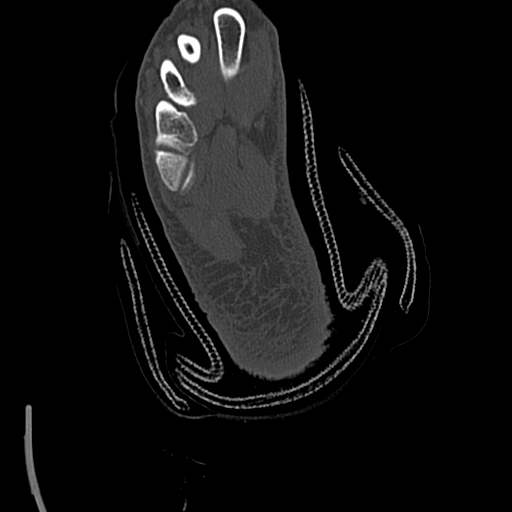
[im 36/154  bone]
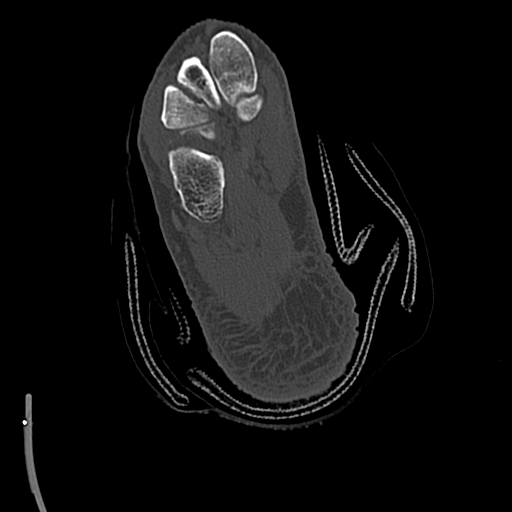
[im 48/154  bone]
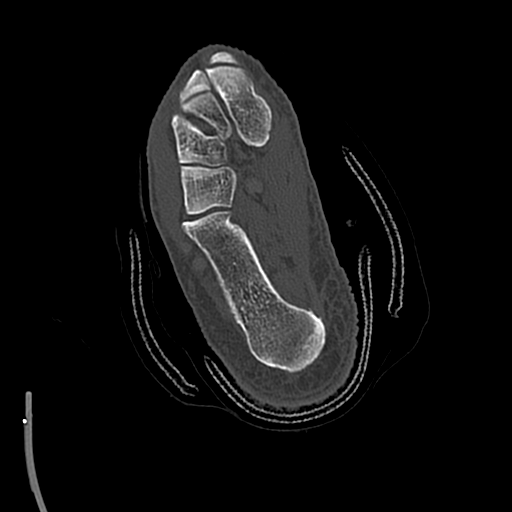
[im 59/154  soft-tissue]
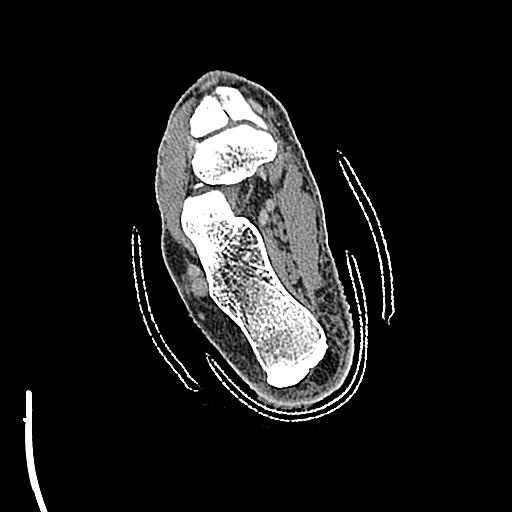
[im 59/154  bone]
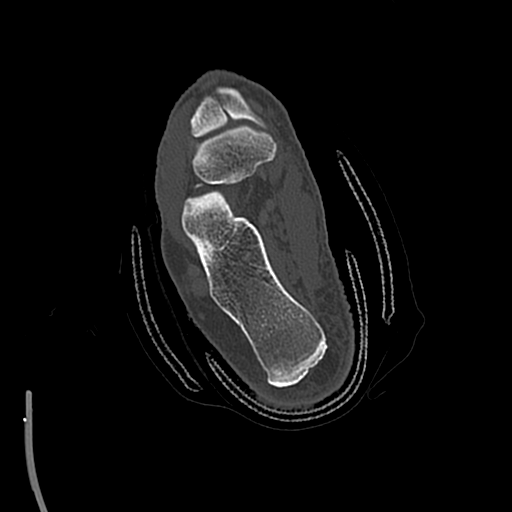
[im 71/154  bone]
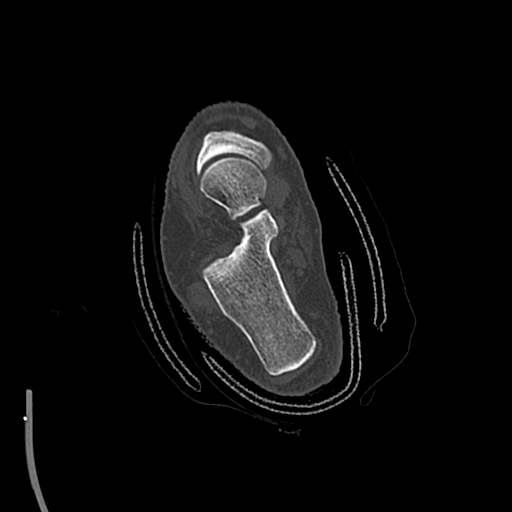
[im 83/154  bone]
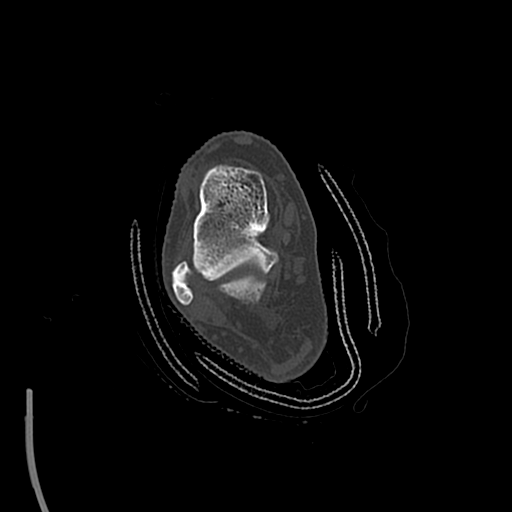
[im 95/154  bone]
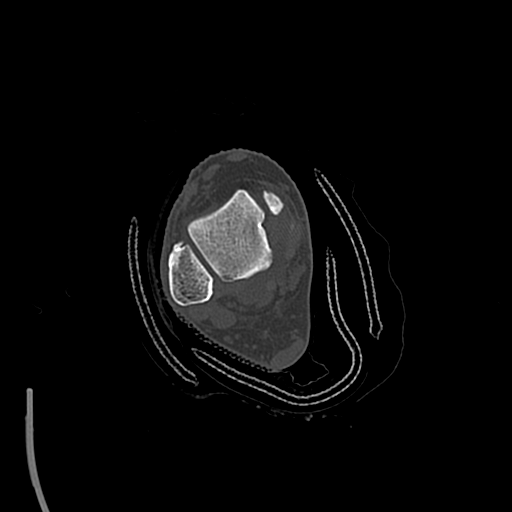
[im 106/154  soft-tissue]
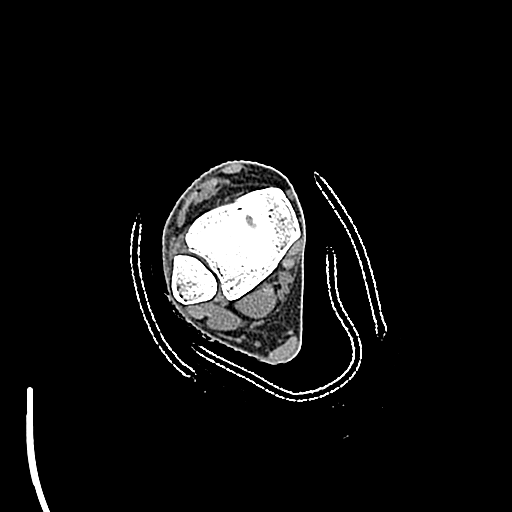
[im 106/154  bone]
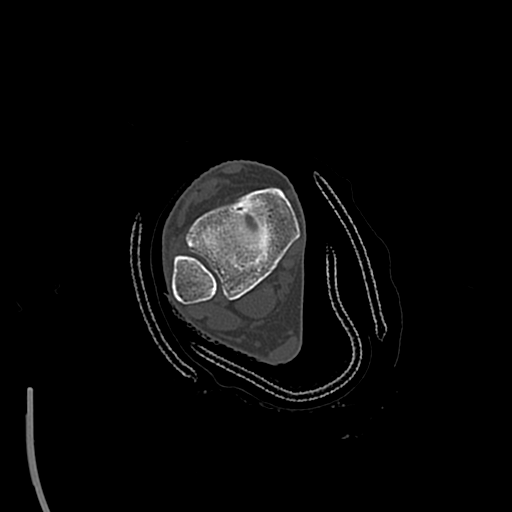
[im 118/154  bone]
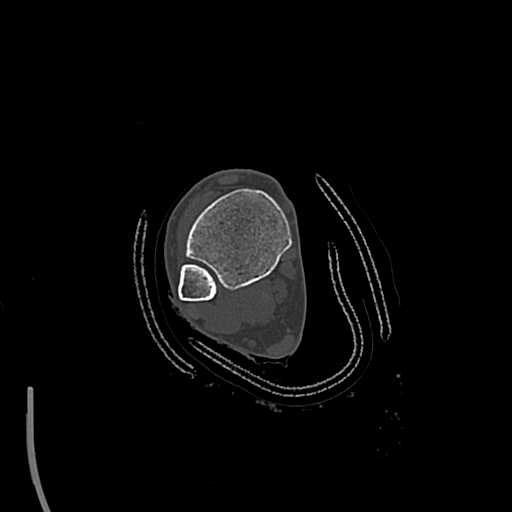
[im 130/154  bone]
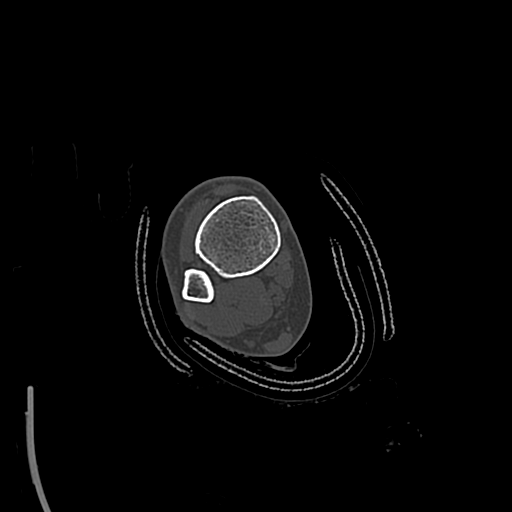
[im 142/154  bone]
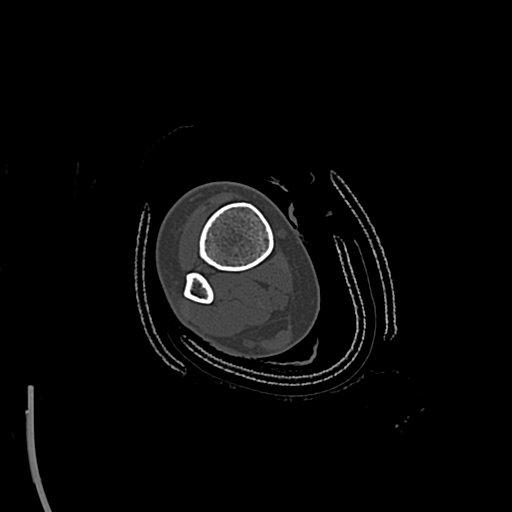

[12 of 14 positions shown; findings below may reference images not displayed]

PROCEDURE:     CT  - CT ANKLE RIGHT WO  - [DATE] [DATE]

RESULT:     Multislice helical acquisition of the right ankle is performed.
Sagittal, coronal and axial bone window reconstructions are performed at 1
mm slice thickness. There is a nondisplaced fracture in the posterior
malleolus extending to the articular surface. The distal fibula appears
intact. The posterior malleolar fracture does not appear to be the component
of the spiral fracture within the distal third of the tibial shaft. The
distal fibula appears intact. The talus and calcaneus are intact area there
is a minimal plantar degenerative spurring from the calcaneus. Tarsals
appear intact.
IMPRESSION: 1. Fracture in the posterior malleolus without distraction. This does not
appear to be a component of the spiral fracture seen on the scout view in
the tibia. Other changes as discussed above.

[REDACTED]

## 2012-09-05 ENCOUNTER — Ambulatory Visit (INDEPENDENT_AMBULATORY_CARE_PROVIDER_SITE_OTHER): Payer: BC Managed Care – PPO | Admitting: Psychology

## 2012-09-05 DIAGNOSIS — F411 Generalized anxiety disorder: Secondary | ICD-10-CM

## 2012-10-02 ENCOUNTER — Ambulatory Visit: Payer: BC Managed Care – PPO | Admitting: Psychology

## 2014-07-25 NOTE — Discharge Summary (Signed)
PATIENT NAME:  [name] ReasonerGREENE, Pollie MR#:  [id number] DATE OF BIRTH:  [birth year]  DATE OF ADMISSION:  04/28/2012  DATE OF DISCHARGE:  05/01/2012    ADMITTING DIAGNOSES:  1.  Right grade I open spiral distal tibial fracture.  2.  Right nondisplaced posterior malleolar fracture.   POSTOPERATIVE DIAGNOSIS:  1.  Right grade I open spiral distal tibial fracture.  2.  Right nondisplaced posterior malleolar fracture.   PROCEDURES: 1.  Right tibial intramedullary nailing.  2.  Irrigation and excisional debridement down to and including bone of grade I open distal tibia fracture.  3.  Closed treatment of right ankle nondisplaced posterior malleolus fracture.  4.  Simple closure, 1 layer of a traumatic wound, and 1 cm in length.   IMPLANTS: Synthes size 10 tibial nail with 3 distal interlocking screws and 2 proximal interlocking screws. A threaded Steinmann pin was utilized to provide a temporary fixation of posterior malleolar fracture and was removed at the end of the case.     FLUIDS:  Per anesthesia record.  BLOOD LOSS:  Per anesthesia record.   COMPLICATIONS:  None.   SPECIMEN:  None.   DRAINS:  None.  HISTORY: The patient is a 49 year old female who fell and sustained injury to her right lower extremity. She presented to the ER with pain and deformity to the distal tibia. She rated her pain as an 8/10. It was sharp in nature and exacerbated by movement of the extremity and relieved by rest. She had no other injuries to the other extremities.   PHYSICAL EXAMINATION: GENERAL:  The patient is well appearing, well nourished, in no acute distress.  EXTREMITIES:  Examination of the right lower extremity reveals a 1 cm focal open area over the distal fracture fragment along the anterior tibia. She has tenderness to palpation at the fracture site. She has good range of motion of the knee and all digits. She has gross instability of the fracture site itself. Intact sensation distally. Good distal  pulses. Compartments are soft and compressible. Examination of left lower extremity is performed in a similar manner, and is free of any abnormalities.   HOSPITAL COURSE:  The patient was admitted to the hospital on 04/28/2012. She had surgery 04/29/2012, and then was brought to the orthopedic floor from the PACU in stable condition. The patient was treated for an open fracture and given IV antibiotics. On 04/30/2012, her labs and vital signs were stable, the pain was well controlled, and she had performed well with physical therapy. By 05/01/2012, on postop day 2, the patient was stable and ready for discharge home with home physical therapy.   DISCHARGE INSTRUCTIONS:  She may gradually increase weight-bearing on the affected extremity. She is to elevate the affected foot or leg on 1 or 2 pillows with the foot higher than the knee. She needs to use incentive spirometry every 1 hour while awake and encourage cough and deep breathing. She may resume a regular diet as tolerated.  She needs to apply an ice pack to the affected area, and do not get the dressing or bandage wet or dirty. [facility] if the dressing gets water under it. Leave the dressing on. Change the dressing or bandage as needed. [facility] if any of the following occur: Bright red bleeding from the incision or wound, fever above 101.5 degrees, redness, swelling or drainage at the incision. [facility] if you experience any increased leg pain, numbness or weakness in your legs or  bowel or bladder symptoms. Home physical therapy has been arranged for continuation of rehab program, and call [facility] if a therapist has not contacted you within 48 hours of your return home. Call [facility] ortho for a followup appointment in 2 weeks.   DISCHARGE MEDICATIONS: Vicodin 5/325, one tab orally every 4 to 6 hours as needed for pain, magnesium hydroxide 8% oral suspension 30 mL oral 2 times a day as needed for   constipation, enoxaparin 40 mg subcutaneous  once a day for 14 days, docusate sodium 100 mg oral capsule 2 caps orally 2 times a day, Keflex 500 mg oral capsule 1 cap orally 3 times a day for 10 days.   ____________________________ Evon Slack, PA-C tcg:dm D: 05/01/2012 12:19:35 ET T: 05/01/2012 13:20:17 ET JOB#: [id number]  cc: Evon Slack, PA-C, <Dictator> Evon Slack [place] ELECTRONICALLY SIGNED 05/08/2012 10:38

## 2014-07-25 NOTE — H&P (Signed)
PATIENT NAME:  [name], Ariannah MR#:  [id number] DATE OF BIRTH:  1965-05-04  DATE OF ADMISSION:  04/28/2012  CHIEF COMPLAINT:  Right leg pain.   HISTORY OF PRESENT ILLNESS:  This is a 49 year old female who fell and sustained injury to her right lower extremity.  She presented to the Emergency Room with pain and deformity of the distal tibia.  She rated her pain as an VIII/X, sharp in nature, exacerbated by movement of the extremity and relieved by rest.  She had no other injuries to the other extremities.   PAST MEDICAL HISTORY:  None.   PAST SURGICAL HISTORY:  None.   MEDICATIONS:  None.   ALLERGIES:  No known drug allergies.   SOCIAL HISTORY:  The patient denies any alcohol use.  She does smoke approximately 5 cigarettes per day.   FAMILY HISTORY:  Noncontributory.   REVIEW OF SYSTEMS:   No chest pain.  No shortness of breath.   PHYSICAL EXAMINATION:  GENERAL:  The patient well-appearing, well-nourished, no acute distress.  EXTREMITIES:  Examination of the right lower extremity reveals a 1 cm poke hole open area over the distal fracture fragment along the anterior tibia.  She has tenderness to palpation at the fracture site.  She has good range of motion of the knee and all digits.  She has gross instability at the fracture site itself.  Intact sensation distally.  Good distal pulses.  Compartments are soft and compressible.  Examination of the left lower extremity is performed in a similar manner, is free of any abnormalities.   IMAGES:  X-rays of the right tib-fib were obtained and reviewed which reveals evidence of a distal tibia spiral fracture with associated proximal fibular fracture.   IMPRESSION:  The patient is a 49 year old female with a right grade 1 open distal tibia fracture with associated proximal fibula fracture.   PLAN:  The patient will undergo a closed reduction to better align her lower extremity as the distal fragment is externally rotated.  She will undergo a formal  I and D in the Emergency Department with saline.  She will be taken to the operating room early in the morning for operative fixation and further I and D.  She was placed on IV Ancef and will be nothing by mouth for surgery.   PROCEDURE:  The patient's right foot was properly aligned along its anatomic axis and splinted with a posterior slab with a stirrup.  We then thoroughly irrigated the 1 cm open area thoroughly with normal saline.  Sterile dressings were applied.  Compartments remained soft and compressible.  The patient tolerated the procedure well.    ____________________________ [place] E. Ithan Touhey, MD ces:ea D: 04/28/2012 23:15:00 ET T: 04/29/2012 04:11:07 ET JOB#: [id number]  cc: [place] E. Annisa Mazzarella, MD, <Dictator> [place] E Devany Aja MD ELECTRONICALLY SIGNED 04/29/2012 7:14

## 2014-07-25 NOTE — Op Note (Signed)
PATIENT NAME:  [name] Carrillo Carrillo, [name] Carrillo MR#:  [id number] DATE OF BIRTH:  1965/11/01  DATE OF PROCEDURE:  04/29/2012  SURGEON: [place] Cecilee Rosner, MD  ANESTHESIA: General.   PREOPERATIVE DIAGNOSES:  1.  Right Grade I open spiral distal tibial fracture.   2.  Right nondisplaced posterior malleolar fracture.   Postoperative diagnosES:  1.  Right grade I open spiral distal tibial fracture.   2.  Right nondisplaced posterior malleolar fracture.   procedureS:  1.  Right tibia intramedullary nailing.  2.  Irrigation and excisional debridement down to and including bone of Grade I open distal tibia fracture.  3.  Closed treatment of right ankle nondisplaced posterior malleolus fracture.  4.  Simple closure, one layer of a traumatic wound, 1 cm in length.   Implants: Synthes size 10 tibial nail with 3 distal interlocking screws and 2 proximal interlocking screws. A threaded Steinmann pin was utilized to provide a temporary fixation of posterior malleolar fracture and was removed at the end of the case.   Fluids: Per anesthesia record.   blood loss: Per anesthesia record.   complications: None.   Specimens: None.   Drains: None.   indications: This is a 49 year old female who fell and sustained injury to her right tibia. She was found to have a Grade I open distal tibia fracture. She was seen and evaluated in the emergency department, where she was splinted. A thorough irrigation was performed in the emergency department. She received immediate antibiotic treatment. She was scheduled for the first case of the next morning, less than eight hours from the time of evaluation in the emergency department. She was indicated for operative intervention. All risks, benefits, and alternatives of the procedure were explained at length to the patient and she wished to proceed. Informed consent was obtained.   procedure: Patient was seen in the preoperative holding area where the operative site was marked by an operative  surgeon.  Preoperative antibiotics were administered. The patient was taken to the operating room where she was placed supine on the operating room table. All bony prominence were well padded. General anesthesia was gently induced. Right lower extremity was isolated. Tourniquet was placed about the right thigh. The right lower extremity was then prepped and draped in usual sterile manner and a surgical timeout was performed.  To begin, I made an incision from the distal pole of patella down to the tibial tubercle. We made our way to the proximal tibia just medial to the patellar tendon. A starting guidepin was utilized to find the proper orientation of both AP and lateral planes.  Starter reamer was then utilized to open up the canal.  Guidewire was placed and reduction of the distal tibia fracture was achieved.  The guidewire was placed down to the physial scar. We then made a small stab incision anteriorly and bluntly dissected down to the anterior cortex of the tibia. A threaded Steinmann pin was placed just proximal to the articular surface securing the posterior malleolar fragment during of the remainder of the procedure.  Once we were happy with the alignment of the tibia, we began reaming. We reamed up to a size 11.5, allowing us to place a size 10 tibial nail. This was tapped down into appropriate position. There was no displacement of the posterior malleolar fracture during placement of the nail. We then utilized perfect circle technique to place three distal interlock screws and used the associated guide to place the proximal 2 locking screws. Prior to placement of the  tibial nail, the 1 cm open incision along the anterior aspect of the distal tibia was extended 1 cm in both directions. Using a curette and a rongeur, we thoroughly debrided the wound down to and including the bone. There was minimal nonviable tissue present. We then thoroughly irrigated this wound with 3 liters of saline using cysto tubing  to gravity. No pulsatile lavage was utilized. After the tibial nail was placed, the wound was again thoroughly irrigated with saline using a bulb syringe. Final radiographs were obtained and found to be satisfactory. The remainder of the wounds were also thoroughly irrigated.  The proximal wound was closed with #1 Vicryl followed by 0 Vicryl, 2-0 Vicryl, and staples. The interlocking screw holes were closed with staples. The traumatic wound was closed in a simple fashion one layer with 3-0 nylon. Sterile dressings were applied as was a splint. Drapes were removed. The patient was awakened from general anesthesia without complication and transported to the recovery room in stable condition.   Postoperative plan: The patient will be nonweightbearing to the right lower extremity. She will receive an additional two days of IV Ancef.  She will receive Lovenox for deep vein thrombosis prophylaxis. She will be seen and evaluated by physical therapy.    ____________________________ [place] E. Layni Kreamer, MD ces:th D: 04/29/2012 20:04:57 ET T: 04/29/2012 20:39:19 ET JOB#: [id number]  cc: [place] E. Joanna Borawski, MD, <Dictator> [place] E Margy Sumler MD ELECTRONICALLY SIGNED 05/25/2012 20:16

## 2017-02-12 ENCOUNTER — Emergency Department: Payer: 59 | Admitting: Anesthesiology

## 2017-02-12 ENCOUNTER — Encounter
Admission: EM | Disposition: A | Discharge status: Home / Self Care | Payer: Self-pay | Source: Home / Self Care | Attending: Emergency Medicine

## 2017-02-12 ENCOUNTER — Other Ambulatory Visit: Payer: Self-pay

## 2017-02-12 ENCOUNTER — Observation Stay
Admission: EM | Admit: 2017-02-12 | Discharge: 2017-02-13 | Disposition: A | Discharge status: Home / Self Care | Payer: 59 | Attending: Orthopedic Surgery | Admitting: Orthopedic Surgery

## 2017-02-12 ENCOUNTER — Emergency Department: Payer: 59

## 2017-02-12 DIAGNOSIS — S82242A Displaced spiral fracture of shaft of left tibia, initial encounter for closed fracture: Secondary | ICD-10-CM

## 2017-02-12 DIAGNOSIS — M79605 Pain in left leg: Secondary | ICD-10-CM | POA: Diagnosis present

## 2017-02-12 DIAGNOSIS — W010XXA Fall on same level from slipping, tripping and stumbling without subsequent striking against object, initial encounter: Secondary | ICD-10-CM | POA: Insufficient documentation

## 2017-02-12 DIAGNOSIS — S82832A Other fracture of upper and lower end of left fibula, initial encounter for closed fracture: Secondary | ICD-10-CM

## 2017-02-12 DIAGNOSIS — Z87891 Personal history of nicotine dependence: Secondary | ICD-10-CM | POA: Insufficient documentation

## 2017-02-12 DIAGNOSIS — S82402A Unspecified fracture of shaft of left fibula, initial encounter for closed fracture: Secondary | ICD-10-CM

## 2017-02-12 DIAGNOSIS — X501XXA Overexertion from prolonged static or awkward postures, initial encounter: Secondary | ICD-10-CM | POA: Diagnosis not present

## 2017-02-12 DIAGNOSIS — S82202A Unspecified fracture of shaft of left tibia, initial encounter for closed fracture: Secondary | ICD-10-CM

## 2017-02-12 DIAGNOSIS — S82209A Unspecified fracture of shaft of unspecified tibia, initial encounter for closed fracture: Secondary | ICD-10-CM | POA: Diagnosis present

## 2017-02-12 DIAGNOSIS — S82252A Displaced comminuted fracture of shaft of left tibia, initial encounter for closed fracture: Secondary | ICD-10-CM | POA: Diagnosis not present

## 2017-02-12 HISTORY — PX: TIBIA IM NAIL INSERTION: SHX2516

## 2017-02-12 LAB — COMPREHENSIVE METABOLIC PANEL
ALK PHOS: 78 U/L (ref 38–126)
ALT: 47 U/L (ref 14–54)
AST: 54 U/L — AB (ref 15–41)
Albumin: 4.1 g/dL (ref 3.5–5.0)
Anion gap: 12 (ref 5–15)
BUN: 8 mg/dL (ref 6–20)
CALCIUM: 9.3 mg/dL (ref 8.9–10.3)
CO2: 21 mmol/L — ABNORMAL LOW (ref 22–32)
CREATININE: 0.8 mg/dL (ref 0.44–1.00)
Chloride: 101 mmol/L (ref 101–111)
GFR calc Af Amer: >60 mL/min (ref >60)
Glucose, Bld: 133 mg/dL — ABNORMAL HIGH (ref 65–99)
Potassium: 3.9 mmol/L (ref 3.5–5.1)
Sodium: 134 mmol/L — ABNORMAL LOW (ref 135–145)
Total Bilirubin: 1.3 mg/dL — ABNORMAL HIGH (ref 0.3–1.2)
Total Protein: 7.5 g/dL (ref 6.5–8.1)

## 2017-02-12 LAB — CBC WITH DIFFERENTIAL/PLATELET
BASOS ABS: 0 10*3/uL (ref 0–0.1)
BASOS PCT: 0 %
EOS ABS: 0 10*3/uL (ref 0–0.7)
Eosinophils Relative: 0 %
HCT: 41.5 % (ref 35.0–47.0)
HEMOGLOBIN: 14 g/dL (ref 12.0–16.0)
LYMPHS ABS: 0.8 10*3/uL — AB (ref 1.0–3.6)
LYMPHS PCT: 8 %
MCH: 36.4 pg — ABNORMAL HIGH (ref 26.0–34.0)
MCHC: 33.8 g/dL (ref 32.0–36.0)
MCV: 107.8 fL — ABNORMAL HIGH (ref 80.0–100.0)
MONO ABS: 0.7 10*3/uL (ref 0.2–0.9)
Monocytes Relative: 7 %
NEUTROS ABS: 8.7 10*3/uL — AB (ref 1.4–6.5)
Neutrophils Relative %: 85 %
Platelets: 261 10*3/uL (ref 150–440)
RBC: 3.85 MIL/uL (ref 3.80–5.20)
RDW: 13.8 % (ref 11.5–14.5)
WBC: 10.2 10*3/uL (ref 3.6–11.0)

## 2017-02-12 LAB — TYPE AND SCREEN
ABO/RH(D): O POS
Antibody Screen: NEGATIVE

## 2017-02-12 LAB — HCG, QUANTITATIVE, PREGNANCY: hCG, Beta Chain, Quant, S: 1 m[IU]/mL (ref <5)

## 2017-02-12 LAB — PROTIME-INR
INR: 0.91
PROTHROMBIN TIME: 12.2 s (ref 11.4–15.2)

## 2017-02-12 IMAGING — CR DG C-ARM 61-120 MIN-NO REPORT
7 of 8 series · 7 of 8 positions shown · non-contrast
Comparison: none

[cont. (1 of 7)]
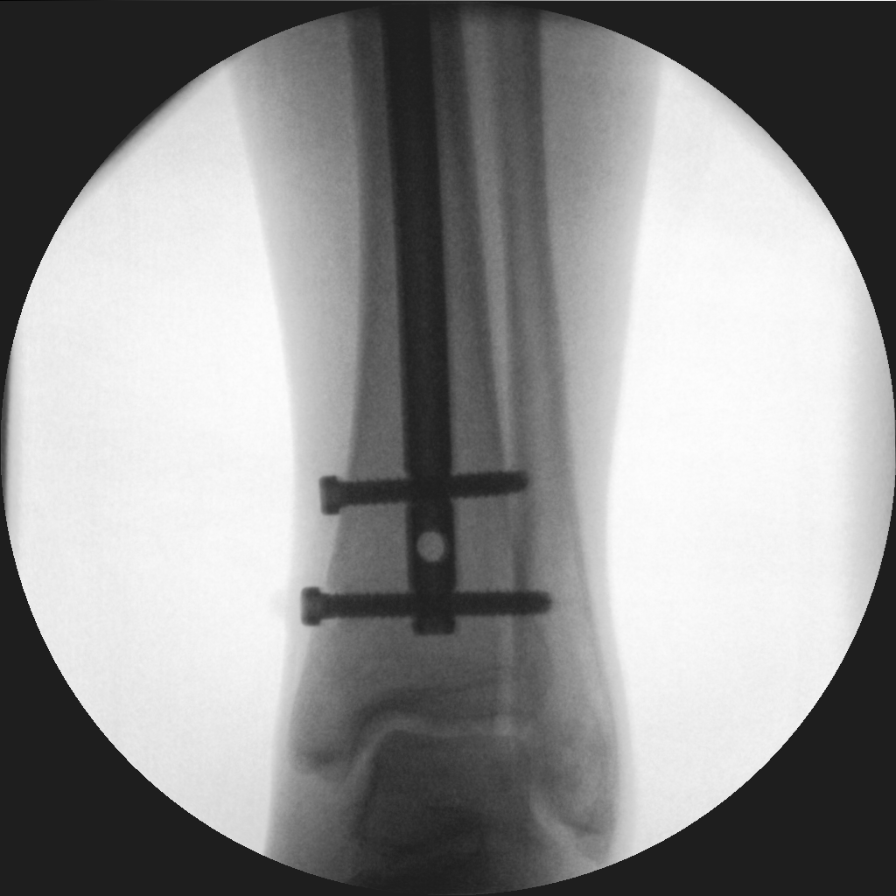

[cont. (2 of 7)]
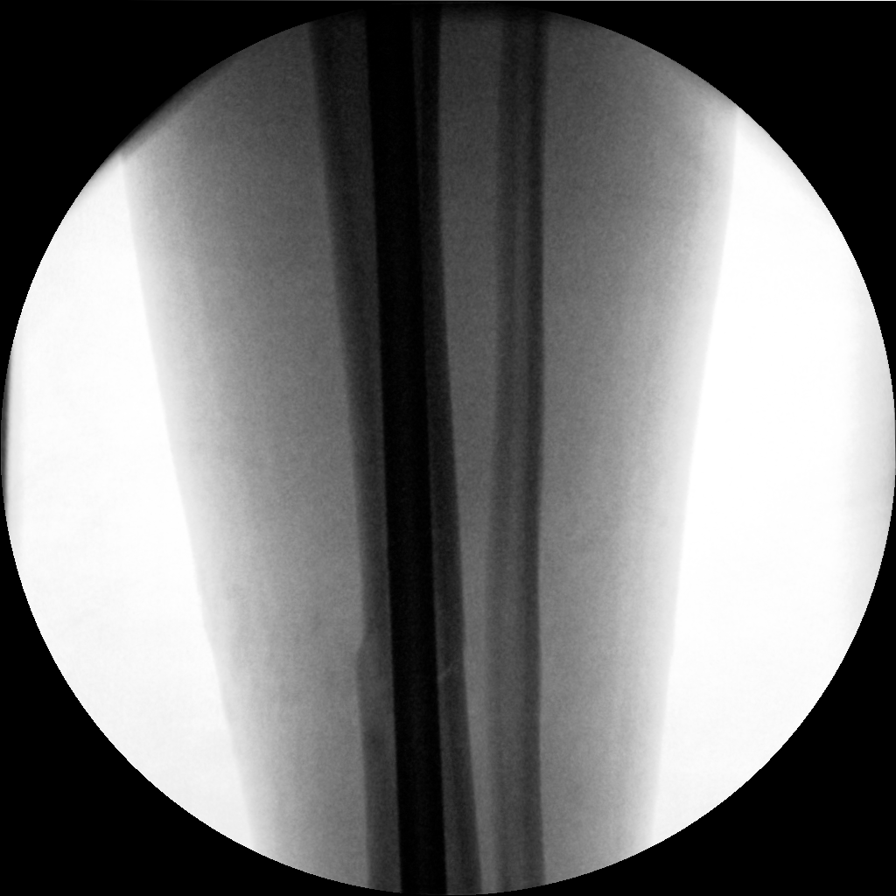

[cont. (3 of 7)]
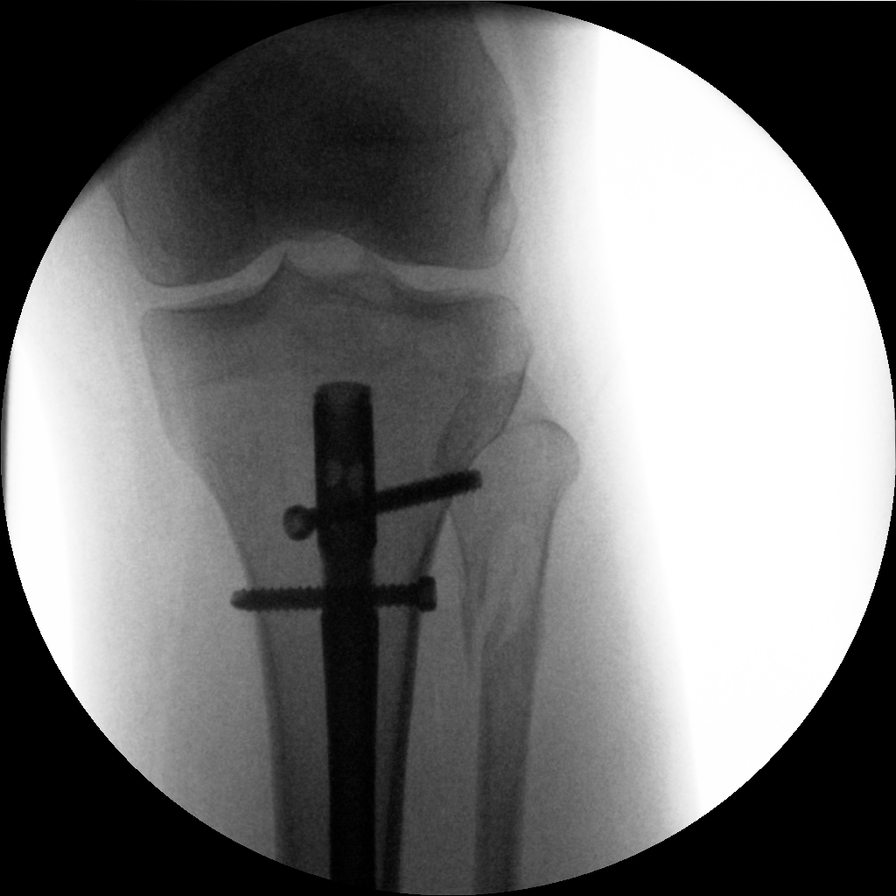

[cont. (4 of 7)]
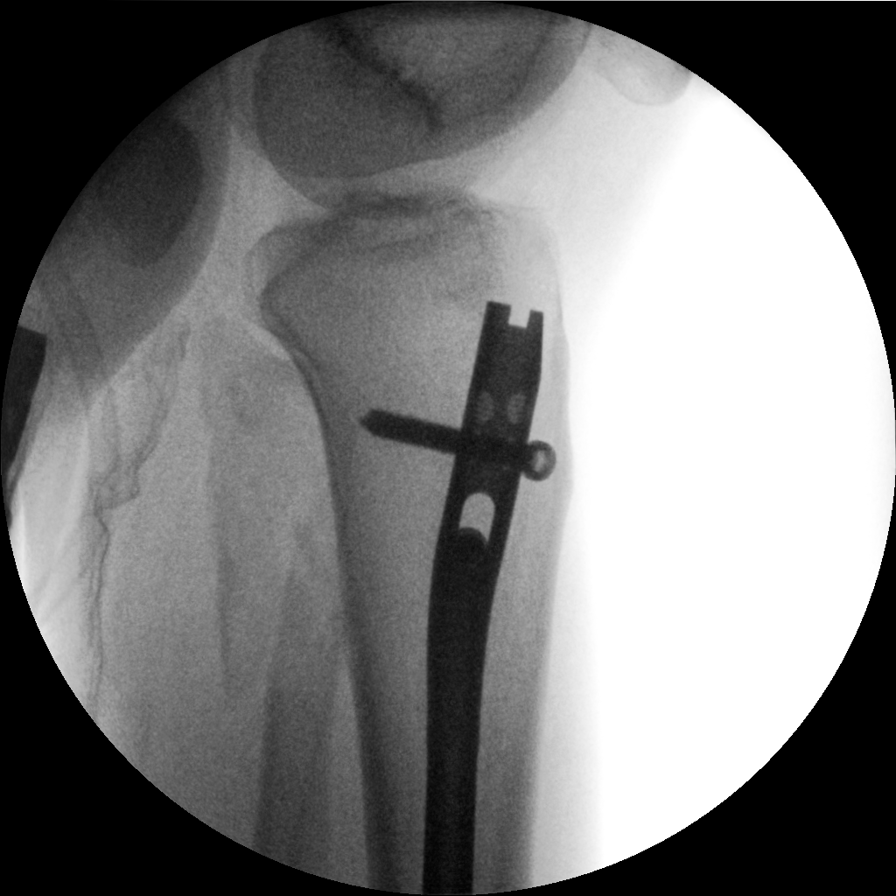

[cont. (5 of 7)]
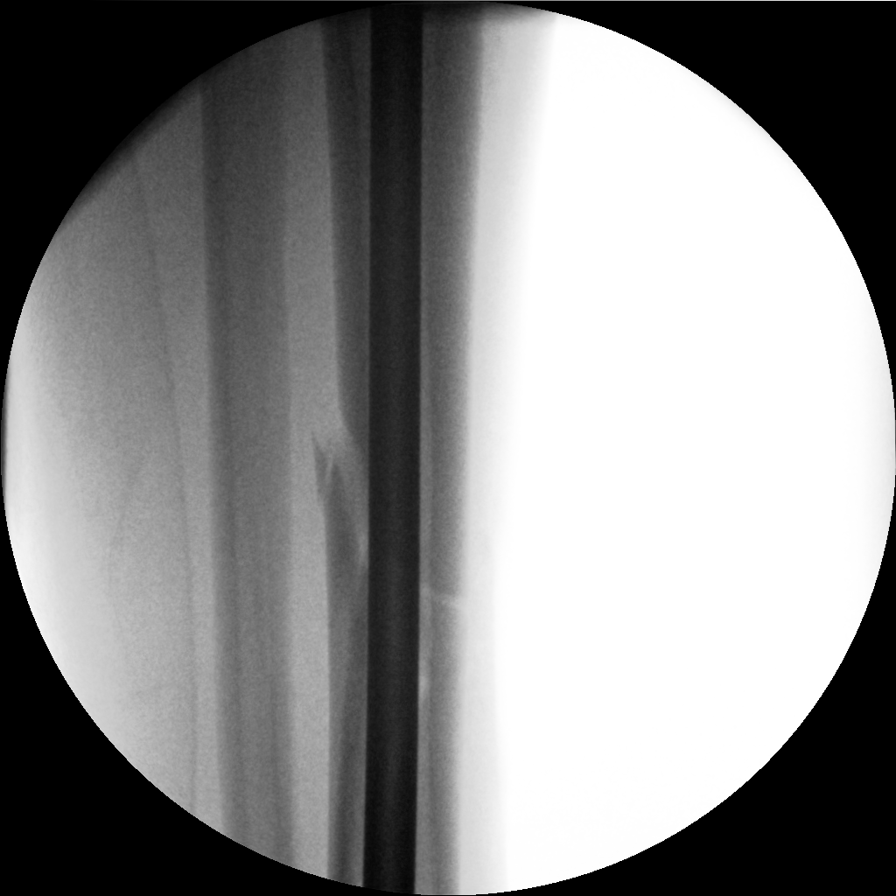

[cont. (6 of 7)]
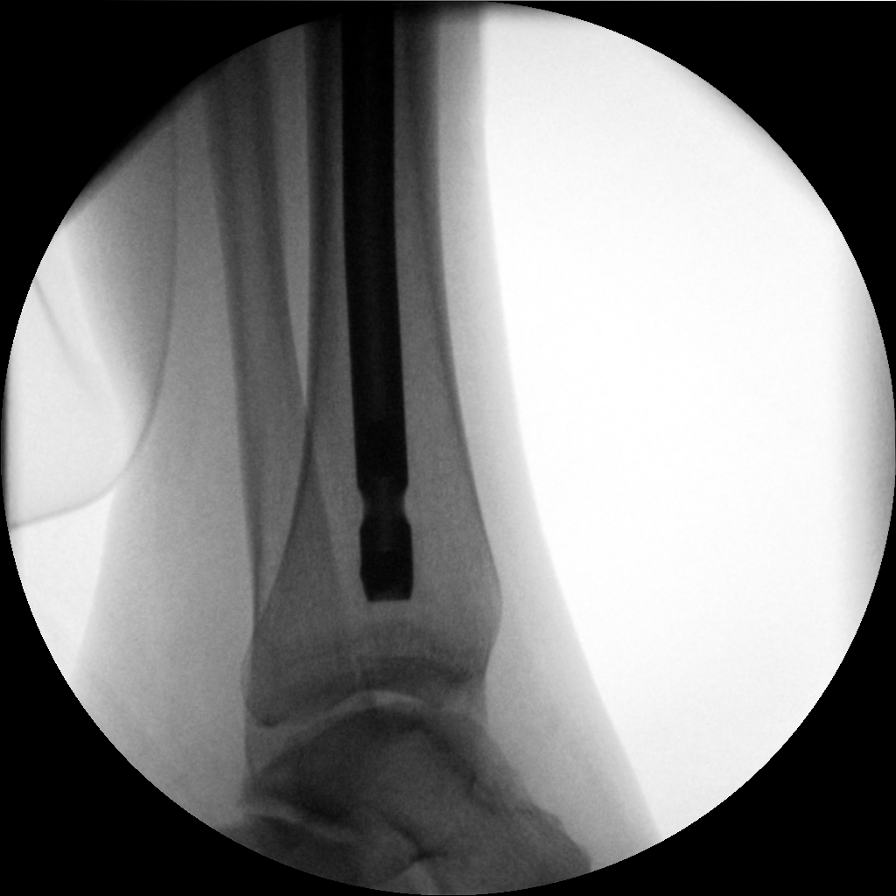

[cont. (7 of 7)]
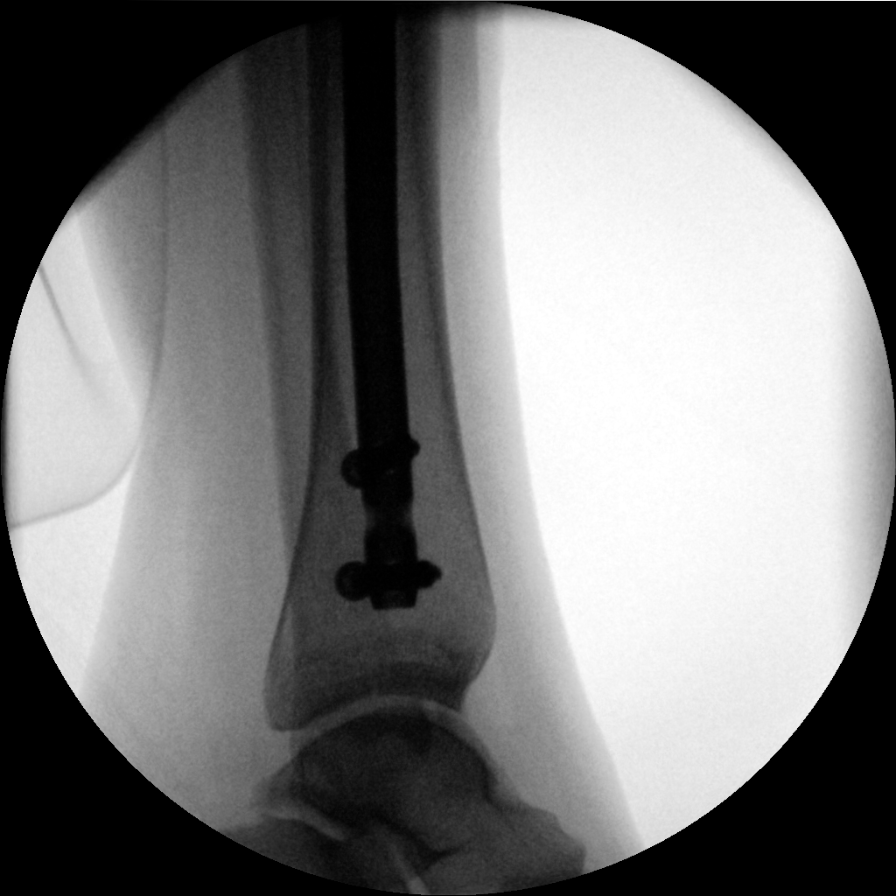

[7 of 8 positions shown; findings below may reference images not displayed]

:
Fluoroscopy was utilized by the requesting physician. No
radiographic interpretation.

## 2017-02-12 IMAGING — DX DG TIBIA/FIBULA 2V*L*
3 series · 3 of 3 positions shown · non-contrast
Comparison: None.

CLINICAL DATA: Post fall, now with left leg pain

EXAM:
LEFT TIBIA AND FIBULA - 2 VIEW

[tibia ap]
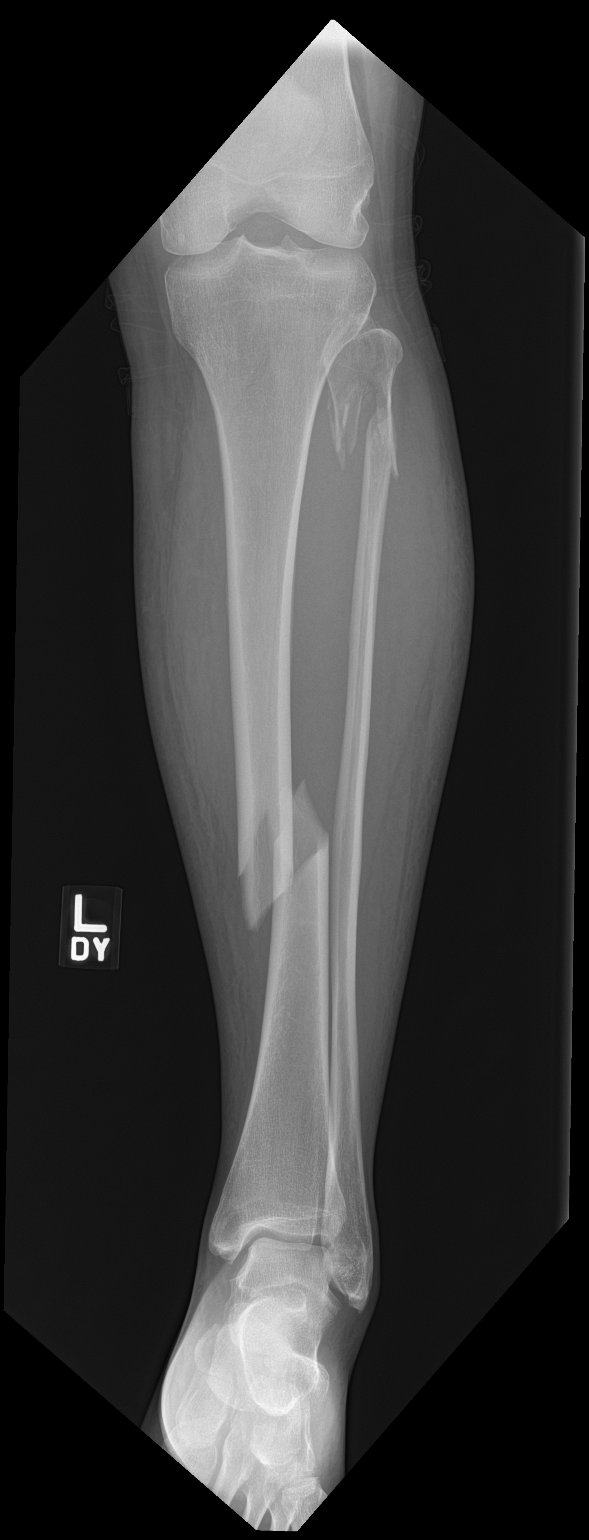

[tibia lat (1 of 2)]
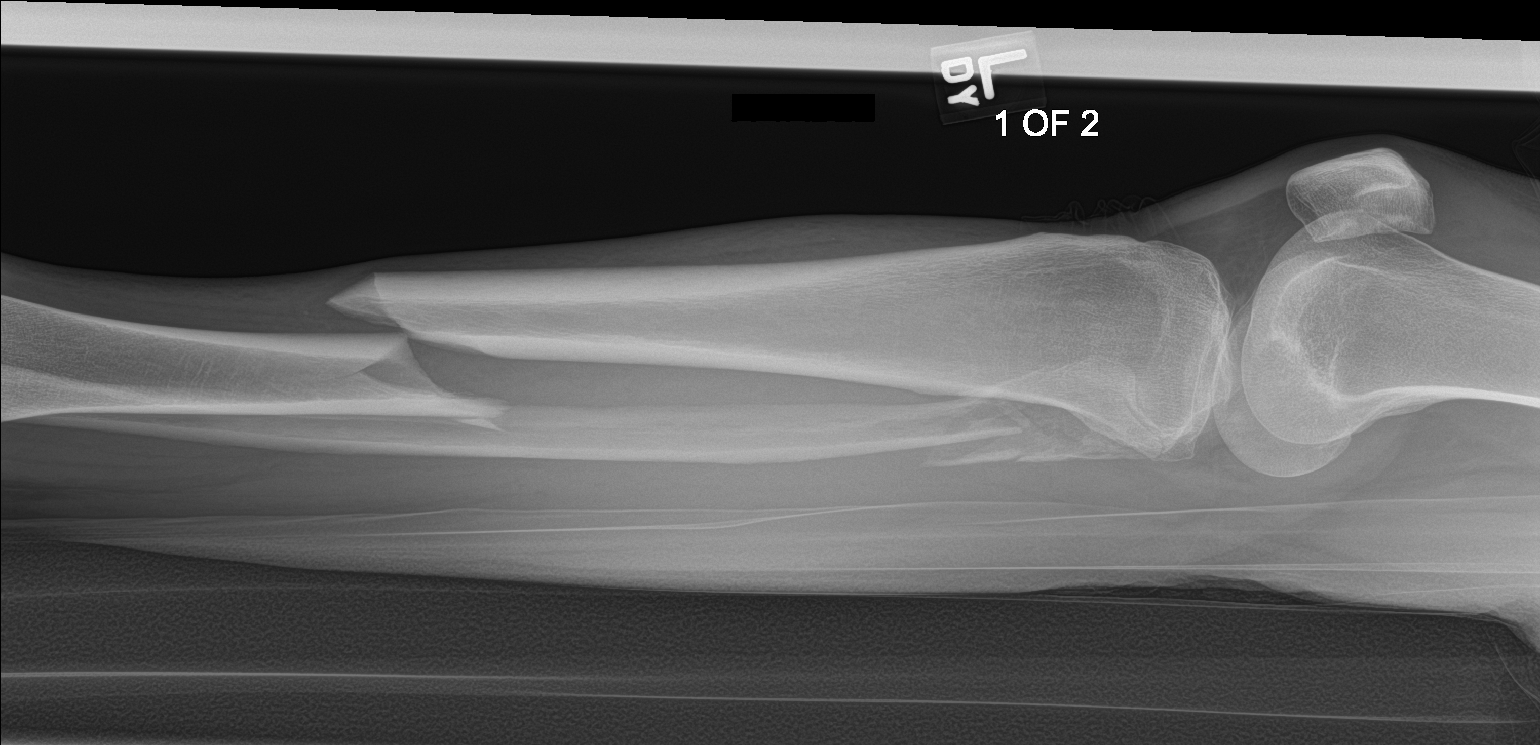

[tibia lat (2 of 2)]
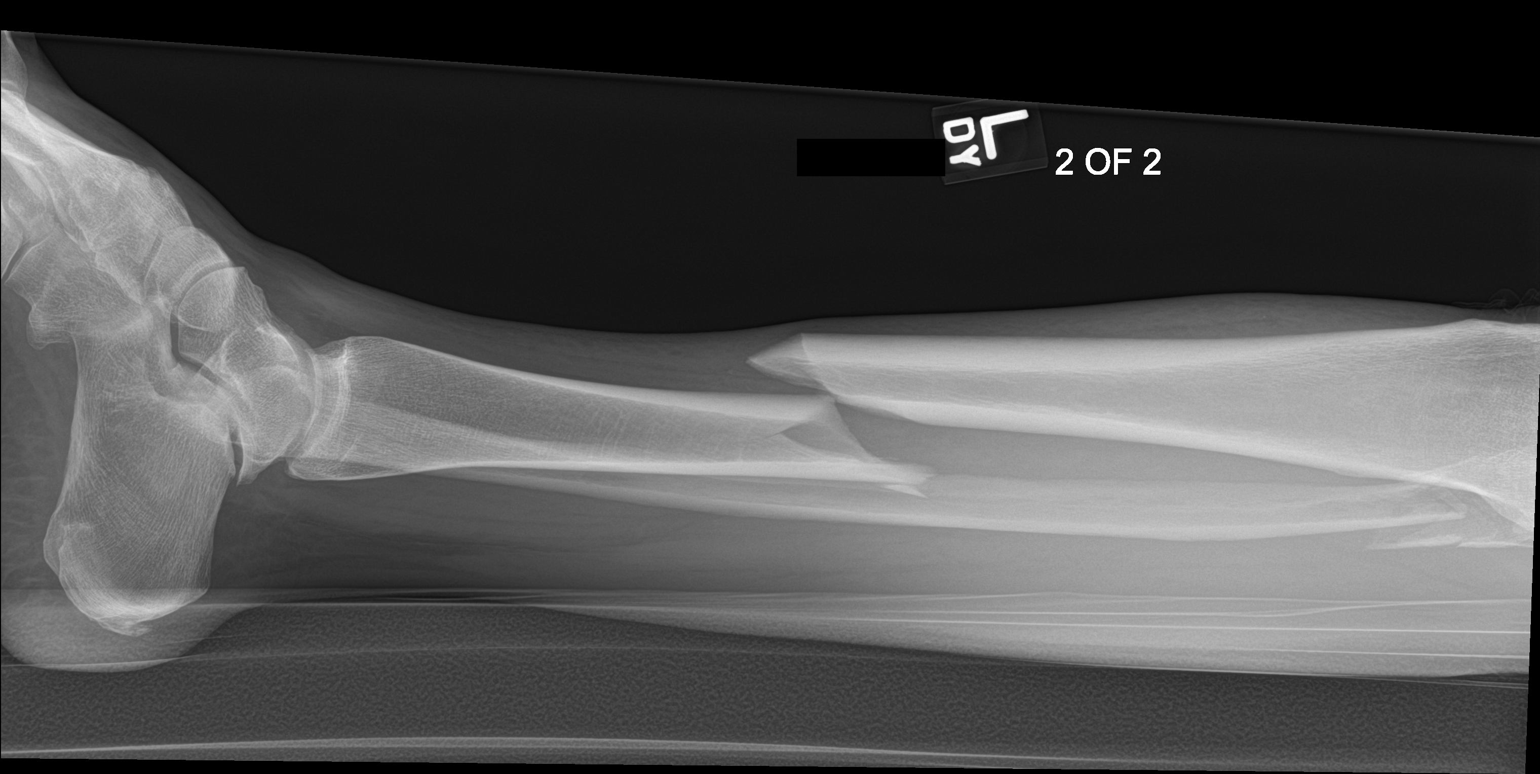

[3 of 3 positions shown; findings below may reference images not displayed]

FINDINGS: There are comminuted obliquely oriented displaced fractures
involving the midshaft of the tibia and proximal metaphysis of the
fibula with associated foreshortening and posterolateral deviation
of the distal fracture fragments.

The fibular fracture likely extends to involve the proximal tib-fib
joint.

Limited visualization of the adjacent knee and ankle is otherwise
normal given obliquity.

Expected adjacent soft tissue swelling.  No radiopaque foreign body.
IMPRESSION: Comminuted, displaced fractures of the midshaft of the tibia and
proximal metaphysis of the fibula.

## 2017-02-12 SURGERY — INSERTION, INTRAMEDULLARY ROD, TIBIA
Anesthesia: Spinal | Laterality: Left

## 2017-02-12 MED ORDER — METHOCARBAMOL 1000 MG/10ML IJ SOLN
500.0000 mg | Freq: Four times a day (QID) | INTRAMUSCULAR | Status: DC | PRN
  Filled 2017-02-12: qty 5

## 2017-02-12 MED ORDER — SODIUM CHLORIDE 0.9 % IV BOLUS (SEPSIS)
1000.0000 mL | Freq: Once | INTRAVENOUS | Status: AC
  Administered 2017-02-12: 1000 mL via INTRAVENOUS

## 2017-02-12 MED ORDER — FENTANYL CITRATE (PF) 100 MCG/2ML IJ SOLN
INTRAMUSCULAR | Status: AC
  Filled 2017-02-12: qty 2

## 2017-02-12 MED ORDER — LIDOCAINE HCL (CARDIAC) 20 MG/ML IV SOLN
INTRAVENOUS | Status: DC | PRN
Start: 2017-02-12 — End: 2017-02-12
  Administered 2017-02-12: 50 mg via INTRAVENOUS

## 2017-02-12 MED ORDER — ONDANSETRON HCL 4 MG PO TABS: 4.0000 mg | ORAL_TABLET | Freq: Four times a day (QID) | ORAL | Status: DC | PRN

## 2017-02-12 MED ORDER — ONDANSETRON HCL 4 MG/2ML IJ SOLN: 4.0000 mg | Freq: Four times a day (QID) | INTRAMUSCULAR | Status: DC | PRN

## 2017-02-12 MED ORDER — PROPOFOL 10 MG/ML IV BOLUS
INTRAVENOUS | Status: DC | PRN
  Administered 2017-02-12: 40 mg via INTRAVENOUS
  Administered 2017-02-12: 20 mg via INTRAVENOUS
  Administered 2017-02-12: 40 mg via INTRAVENOUS

## 2017-02-12 MED ORDER — BUPIVACAINE HCL (PF) 0.5 % IJ SOLN
INTRAMUSCULAR | Status: DC | PRN
  Administered 2017-02-12: 3 mL

## 2017-02-12 MED ORDER — SENNOSIDES-DOCUSATE SODIUM 8.6-50 MG PO TABS: 1.0000 | ORAL_TABLET | Freq: Every evening | ORAL | Status: DC | PRN

## 2017-02-12 MED ORDER — SODIUM CHLORIDE 0.9 % IV SOLN
INTRAVENOUS | Status: DC
  Administered 2017-02-12 – 2017-02-13 (×2): via INTRAVENOUS

## 2017-02-12 MED ORDER — PHENYLEPHRINE HCL 10 MG/ML IJ SOLN
INTRAMUSCULAR | Status: DC | PRN
  Administered 2017-02-12: 200 ug via INTRAVENOUS
  Administered 2017-02-12 (×4): 100 ug via INTRAVENOUS

## 2017-02-12 MED ORDER — DIPHENHYDRAMINE HCL 12.5 MG/5ML PO ELIX: 12.5000 mg | ORAL_SOLUTION | ORAL | Status: DC | PRN

## 2017-02-12 MED ORDER — HYDROMORPHONE HCL 1 MG/ML IJ SOLN: 0.5000 mg | INTRAMUSCULAR | Status: DC | PRN

## 2017-02-12 MED ORDER — NEOMYCIN-POLYMYXIN B GU 40-200000 IR SOLN
Status: AC
  Filled 2017-02-12: qty 4

## 2017-02-12 MED ORDER — PROPOFOL 500 MG/50ML IV EMUL
INTRAVENOUS | Status: DC | PRN
  Administered 2017-02-12: 50 ug/kg/min via INTRAVENOUS
  Administered 2017-02-12: 75 ug/kg/min via INTRAVENOUS

## 2017-02-12 MED ORDER — ACETAMINOPHEN 500 MG PO TABS
1000.0000 mg | ORAL_TABLET | Freq: Three times a day (TID) | ORAL | Status: DC
  Administered 2017-02-12 – 2017-02-13 (×3): 1000 mg via ORAL
  Filled 2017-02-12 (×3): qty 2

## 2017-02-12 MED ORDER — ZOLPIDEM TARTRATE 5 MG PO TABS: 5.0000 mg | ORAL_TABLET | Freq: Every evening | ORAL | Status: DC | PRN

## 2017-02-12 MED ORDER — MIDAZOLAM HCL 2 MG/2ML IJ SOLN
INTRAMUSCULAR | Status: AC
  Filled 2017-02-12: qty 2

## 2017-02-12 MED ORDER — ONDANSETRON HCL 4 MG/2ML IJ SOLN: 4.0000 mg | Freq: Once | INTRAMUSCULAR | Status: DC | PRN

## 2017-02-12 MED ORDER — OXYCODONE HCL 5 MG PO TABS
5.0000 mg | ORAL_TABLET | ORAL | Status: DC | PRN
  Administered 2017-02-12 – 2017-02-13 (×3): 5 mg via ORAL
  Administered 2017-02-13 (×2): 10 mg via ORAL
  Filled 2017-02-12: qty 2
  Filled 2017-02-12 (×3): qty 1
  Filled 2017-02-12: qty 2

## 2017-02-12 MED ORDER — CEFAZOLIN SODIUM-DEXTROSE 2-4 GM/100ML-% IV SOLN: 2.0000 g | Freq: Three times a day (TID) | INTRAVENOUS | Status: DC

## 2017-02-12 MED ORDER — PROPOFOL 500 MG/50ML IV EMUL
INTRAVENOUS | Status: AC
  Filled 2017-02-12: qty 50

## 2017-02-12 MED ORDER — MORPHINE SULFATE (PF) 4 MG/ML IV SOLN
6.0000 mg | Freq: Once | INTRAVENOUS | Status: AC
  Administered 2017-02-12: 6 mg via INTRAVENOUS
  Filled 2017-02-12: qty 2

## 2017-02-12 MED ORDER — DOCUSATE SODIUM 100 MG PO CAPS
100.0000 mg | ORAL_CAPSULE | Freq: Two times a day (BID) | ORAL | Status: DC
  Administered 2017-02-12 – 2017-02-13 (×2): 100 mg via ORAL
  Filled 2017-02-12 (×2): qty 1

## 2017-02-12 MED ORDER — METHOCARBAMOL 500 MG PO TABS: 500.0000 mg | ORAL_TABLET | Freq: Four times a day (QID) | ORAL | Status: DC | PRN

## 2017-02-12 MED ORDER — ENOXAPARIN SODIUM 40 MG/0.4ML ~~LOC~~ SOLN
40.0000 mg | SUBCUTANEOUS | Status: DC
  Administered 2017-02-13: 40 mg via SUBCUTANEOUS
  Filled 2017-02-12: qty 0.4

## 2017-02-12 MED ORDER — BISACODYL 10 MG RE SUPP: 10.0000 mg | Freq: Every day | RECTAL | Status: DC | PRN

## 2017-02-12 MED ORDER — TETRACAINE HCL 1 % IJ SOLN
INTRAMUSCULAR | Status: DC | PRN
  Administered 2017-02-12: 10 mg via INTRASPINAL

## 2017-02-12 MED ORDER — MIDAZOLAM HCL 5 MG/5ML IJ SOLN
INTRAMUSCULAR | Status: DC | PRN
  Administered 2017-02-12: 2 mg via INTRAVENOUS

## 2017-02-12 MED ORDER — FENTANYL CITRATE (PF) 100 MCG/2ML IJ SOLN: 25.0000 ug | INTRAMUSCULAR | Status: DC | PRN

## 2017-02-12 MED ORDER — DEXTROSE 5 % IV SOLN
2.0000 g | Freq: Three times a day (TID) | INTRAVENOUS | Status: AC
  Administered 2017-02-12 – 2017-02-13 (×2): 2 g via INTRAVENOUS
  Filled 2017-02-12 (×4): qty 2000

## 2017-02-12 MED ORDER — BUPIVACAINE HCL (PF) 0.5 % IJ SOLN
INTRAMUSCULAR | Status: AC
  Filled 2017-02-12: qty 10

## 2017-02-12 MED ORDER — ONDANSETRON HCL 4 MG/2ML IJ SOLN
4.0000 mg | Freq: Once | INTRAMUSCULAR | Status: AC
  Administered 2017-02-12: 4 mg via INTRAVENOUS
  Filled 2017-02-12: qty 2

## 2017-02-12 MED ORDER — SODIUM CHLORIDE 0.9 % IV SOLN
INTRAVENOUS | Status: DC | PRN
  Administered 2017-02-12: 10:00:00 via INTRAVENOUS

## 2017-02-12 MED ORDER — PROPOFOL 10 MG/ML IV BOLUS
INTRAVENOUS | Status: AC
  Filled 2017-02-12: qty 20

## 2017-02-12 MED ORDER — FENTANYL CITRATE (PF) 100 MCG/2ML IJ SOLN
INTRAMUSCULAR | Status: DC | PRN
  Administered 2017-02-12: 50 ug via INTRAVENOUS

## 2017-02-12 MED ORDER — INFLUENZA VAC SPLIT QUAD 0.5 ML IM SUSY
0.5000 mL | PREFILLED_SYRINGE | INTRAMUSCULAR | Status: DC
  Filled 2017-02-12: qty 0.5

## 2017-02-12 MED ORDER — CEFAZOLIN SODIUM-DEXTROSE 2-3 GM-%(50ML) IV SOLR
INTRAVENOUS | Status: DC | PRN
  Administered 2017-02-12: 2 g via INTRAVENOUS

## 2017-02-12 SURGICAL SUPPLY — 59 items
BIT DRILL AO GAMMA 4.2X180 (BIT) ×1 IMPLANT
BIT DRILL AO GAMMA 4.2X340 (BIT) ×1 IMPLANT
BLADE CLIPPER SURG (BLADE) ×2 IMPLANT
CANISTER SUCT 1200ML W/VALVE (MISCELLANEOUS) ×2 IMPLANT
CHLORAPREP W/TINT 26ML (MISCELLANEOUS) ×2 IMPLANT
CUFF TOURN 24 STER (MISCELLANEOUS) IMPLANT
CUFF TOURN 30 STER DUAL PORT (MISCELLANEOUS) ×1 IMPLANT
DRAPE C-ARM XRAY 36X54 (DRAPES) ×2 IMPLANT
DRAPE C-ARMOR (DRAPES) ×2 IMPLANT
ELECT CAUTERY BLADE 6.4 (BLADE) ×2 IMPLANT
ELECT REM PT RETURN 9FT ADLT (ELECTROSURGICAL) ×2
ELECTRODE REM PT RTRN 9FT ADLT (ELECTROSURGICAL) ×1 IMPLANT
GAUZE PETRO XEROFOAM 1X8 (MISCELLANEOUS) ×2 IMPLANT
GAUZE SPONGE 4X4 12PLY STRL (GAUZE/BANDAGES/DRESSINGS) ×2 IMPLANT
GAUZE XEROFORM 4X4 STRL (GAUZE/BANDAGES/DRESSINGS) ×1 IMPLANT
GLOVE BIOGEL PI IND STRL 8 (GLOVE) ×1 IMPLANT
GLOVE BIOGEL PI INDICATOR 8 (GLOVE) ×1
GLOVE SURG SYN 7.5  E (GLOVE) ×4
GLOVE SURG SYN 7.5 E (GLOVE) ×4 IMPLANT
GLOVE SURG SYN 7.5 PF PI (GLOVE) ×2 IMPLANT
GOWN STRL REUS W/ TWL LRG LVL3 (GOWN DISPOSABLE) ×1 IMPLANT
GOWN STRL REUS W/ TWL XL LVL3 (GOWN DISPOSABLE) ×1 IMPLANT
GOWN STRL REUS W/TWL LRG LVL3 (GOWN DISPOSABLE) ×2
GOWN STRL REUS W/TWL XL LVL3 (GOWN DISPOSABLE) ×2
GUIDEROD T2 3X1000 (ROD) ×1 IMPLANT
GUIDEWIRE GAMMA (WIRE) ×2 IMPLANT
K-WIRE FIXATION 3X285 COATED (WIRE) ×4
KIT RM TURNOVER STRD PROC AR (KITS) ×2 IMPLANT
KWIRE FIXATION 3X285 COATED (WIRE) IMPLANT
NAIL ELAS INSERT SLV SPI 8-11 (MISCELLANEOUS) ×1 IMPLANT
NAIL TIBIAL STANDARD (Nail) ×2 IMPLANT
NAIL TIBIAL STD (Nail) IMPLANT
NDL FILTER BLUNT 18X1 1/2 (NEEDLE) ×1 IMPLANT
NEEDLE FILTER BLUNT 18X 1/2SAF (NEEDLE) ×1
NEEDLE FILTER BLUNT 18X1 1/2 (NEEDLE) ×1 IMPLANT
NS IRRIG 1000ML POUR BTL (IV SOLUTION) ×2 IMPLANT
PACK TOTAL KNEE (MISCELLANEOUS) ×2 IMPLANT
PAD CAST CTTN 4X4 STRL (SOFTGOODS) IMPLANT
PADDING CAST COTTON 4X4 STRL (SOFTGOODS) ×2
PUTTY DBX 1CC (Putty) ×2 IMPLANT
PUTTY DBX 1CC DEPUY (Putty) IMPLANT
REAMER INTRAMEDULLARY 8MM 510 (MISCELLANEOUS) ×1 IMPLANT
SCREW LOCKING T2 F/T  5MMX40MM (Screw) ×1 IMPLANT
SCREW LOCKING T2 F/T  5MMX45MM (Screw) ×2 IMPLANT
SCREW LOCKING T2 F/T  5X37.5MM (Screw) ×1 IMPLANT
SCREW LOCKING T2 F/T 5MMX40MM (Screw) IMPLANT
SCREW LOCKING T2 F/T 5MMX45MM (Screw) IMPLANT
SCREW LOCKING T2 F/T 5X37.5MM (Screw) IMPLANT
SPLINT FAST PLASTER 5X30 (CAST SUPPLIES) ×1
SPLINT PLASTER CAST FAST 5X30 (CAST SUPPLIES) IMPLANT
STAPLER SKIN PROX 35W (STAPLE) ×2 IMPLANT
STRYKER DRILL4.2X130MM ×1 IMPLANT
STRYKER REAMER SHAFT,BIXCUT ×1 IMPLANT
STRYKER T2TIBIANAIL INSERTION SLEEVE ×1 IMPLANT
SUCTION FRAZIER HANDLE 10FR (MISCELLANEOUS) ×1
SUCTION TUBE FRAZIER 10FR DISP (MISCELLANEOUS) IMPLANT
SUT VIC AB 0 CT1 36 (SUTURE) ×2 IMPLANT
SUT VIC AB 2-0 CT1 (SUTURE) ×2 IMPLANT
SYR 5ML LL (SYRINGE) ×2 IMPLANT

## 2017-02-12 NOTE — OR Nursing (Signed)
Patient came to OR still wearing her clothes.Her shirt was removed and her pants and panties were cut off in PACU holding.

## 2017-02-12 NOTE — ED Triage Notes (Signed)
Pt had fall yesterday, reports dog pulled her down. Fell at 515 in the evening, able to get to couch, but now hurting this morning. Deformity to left leg.

## 2017-02-12 NOTE — Transfer of Care (Signed)
Immediate Anesthesia Transfer of Care Note  Patient: [name] Carrillo  Procedure(s) Performed: INTRAMEDULLARY (IM) NAIL TIBIAL (Left )  Patient Location: PACU  Anesthesia Type:Spinal  Level of Consciousness: awake, alert  and oriented  Airway & Oxygen Therapy: Patient Spontanous Breathing and Patient connected to face mask oxygen  Post-op Assessment: Report given to RN and Post -op Vital signs reviewed and stable  Post vital signs: Reviewed and stable  Last Vitals:  Vitals:   02/12/17 0812  BP: (!) 150/83  Pulse: 90  Resp: 18  Temp: 36.8 C  SpO2: 99%    Last Pain:  Vitals:   02/12/17 0924  TempSrc:   PainSc: 2          Complications: No apparent anesthesia complications

## 2017-02-12 NOTE — ED Provider Notes (Signed)
[facility] Emergency Department Provider Note  ____________________________________________   First MD Initiated Contact with Patient 02/12/17 [phone]     (approximate)  I have reviewed the triage vital signs and the nursing notes.   HISTORY  Chief Complaint Fall and Leg Pain    HPI [name] Carrillo is a 51 y.o. female is brought to the emergency department by EMS with sudden onset severe left distal leg pain that began yesterday roughly 15 hours prior to arrival.  She said that she ordered a pizza because her family was out of town and when she went outside she slipped and twisted and fell falling to the ground. She "did not think it was that big a deal" so she did not call 911 until this morning.  She slept on the floor.  She last had anything to eat or drink last night.  She denies drug or alcohol use.    History reviewed. No pertinent past medical history.  There are no active problems to display for this patient.   Past Surgical History:  Procedure Laterality Date  . right leg surgery      Prior to Admission medications   Not on File    Allergies Patient has no known allergies.  No family history on file.  Social History Social History   Tobacco Use  . Smoking status: Former Games developermoker  . Smokeless tobacco: Never Used  Substance Use Topics  . Alcohol use: Yes  . Drug use: No    Review of Systems Constitutional: No fever/chills Eyes: No visual changes. ENT: No sore throat. Cardiovascular: Denies chest pain. Respiratory: Denies shortness of breath. Gastrointestinal: No abdominal pain.  No nausea, no vomiting.  No diarrhea.  No constipation. Genitourinary: Negative for dysuria. Musculoskeletal: Negative for back pain. Skin: Negative for rash. Neurological: Negative for headaches, focal weakness or numbness.   ____________________________________________   PHYSICAL EXAM:  VITAL SIGNS: ED Triage Vitals  Enc Vitals Group     BP        Pulse      Resp      Temp      Temp src      SpO2      Weight      Height      Head Circumference      Peak Flow      Pain Score      Pain Loc      Pain Edu?      Excl. in GC?     Constitutional: Alert and oriented x4 somewhat uncomfortable appearing nontoxic no diaphoresis speaks in full clear sentences Eyes: PERRL EOMI. Head: Atraumatic. Nose: No congestion/rhinnorhea. Mouth/Throat: No trismus Neck: No stridor.   Cardiovascular: Normal rate, regular rhythm. Grossly normal heart sounds.  Good peripheral circulation. Respiratory: Normal respiratory effort.  No retractions. Lungs CTAB and moving good air Gastrointestinal: Soft nontender Musculoskeletal: No tenderness over medial malleolus or lateral malleolus or for 6 cm proximal No tenderness over navicular, midfoot, or fifth metatarsal 2+ dorsalis pedis pulse Fracture blisters over unstable aspect of distal tibia Compartments soft Patient can fire extensor hallucis longus, extensor digitorum longus, flexor hallucis longus, flexor digitorum longus, tibialis anterior, and gastrocnemius Sensation intact to light touch to sural, saphenous, deep peroneal, superficial peroneal, and tibial nerve    Neurologic:  Normal speech and language. No gross focal neurologic deficits are appreciated. Skin:  Skin is warm, dry and intact. No rash noted. Psychiatric: Mood and affect are normal. Speech and behavior  are normal.    ____________________________________________   DIFFERENTIAL includes but not limited to  Tibia fracture, fibula fracture, dislocation, syncope, mechanical fall ____________________________________________   LABS (all labs ordered are listed, but only abnormal results are displayed)  Labs Reviewed  CBC WITH DIFFERENTIAL/PLATELET - Abnormal; Notable for the following components:      Result Value   MCV 107.8 (*)    MCH 36.4 (*)    Neutro Abs 8.7 (*)    Lymphs Abs 0.8 (*)    All other components within  normal limits  PROTIME-INR  COMPREHENSIVE METABOLIC PANEL  HCG, QUANTITATIVE, PREGNANCY  TYPE AND SCREEN    Blood work reviewed and interpreted by me shows elevated MCV which is nonspecific but could be secondary to alcohol abuse __________________________________________  EKG   ____________________________________________  RADIOLOGY  X-ray of the left tibia-fibula reviewed by me shows comminuted and displaced spiral fractures. ____________________________________________   PROCEDURES  Procedure(s) performed: no  Procedures  Critical Care performed: no  Observation: no ____________________________________________   INITIAL IMPRESSION / ASSESSMENT AND PLAN / ED COURSE  Pertinent labs & imaging results that were available during my care of the patient were reviewed by me and considered in my medical decision making (see chart for details).  The patient arrives splinted but with an unstable appearing fracture.  N.p.o., fluids, labs, x-rays are all pending.    ----------------------------------------- 8:55 AM on 02/12/2017 -----------------------------------------  I discussed the case with on-call orthopedic surgeon Dr. Allena KatzPatel who feels the patient requires inpatient admission for operative repair today.  The patient consents to surgery.  ____________________________________________   FINAL CLINICAL IMPRESSION(S) / ED DIAGNOSES  Final diagnoses:  Closed displaced spiral fracture of shaft of left tibia, initial encounter  Closed fracture of proximal end of left fibula, unspecified fracture morphology, initial encounter      NEW MEDICATIONS STARTED DURING THIS VISIT:  This SmartLink is deprecated. Use AVSMEDLIST instead to display the medication list for a patient.   Note:  This document was prepared using Dragon voice recognition software and may include unintentional dictation errors.     Merrily Brittleifenbark, Mykira Hofmeister, MD 02/12/17 [phone]

## 2017-02-12 NOTE — Progress Notes (Signed)
S/p L IM nailing of L tibia  Postop plan: - NWB LLE. Maintain splint. Elevate leg above level of heart as much as possible. - DVT ppx: Lovenox while inpatient. ASA 325 bid x 4 weeks on discharge. - Ancef x 24 hours - PT/OT in AM. Likely would be able to be discharged medically tomorrow if cleared by PT - PO + IV prn for pain.

## 2017-02-12 NOTE — ED Notes (Signed)
[place]

## 2017-02-12 NOTE — Anesthesia Preprocedure Evaluation (Signed)
Anesthesia Evaluation  Patient identified by MRN, date of birth, ID band Patient awake    Reviewed: Allergy & Precautions, NPO status , Patient's Chart, lab work & pertinent test results  History of Anesthesia Complications Negative for: history of anesthetic complications  Airway Mallampati: III       Dental  (+) Loose, Chipped, Missing   Pulmonary neg sleep apnea, neg COPD, former smoker,           Cardiovascular (-) hypertension(-) Past MI and (-) CHF (-) dysrhythmias (-) Valvular Problems/Murmurs     Neuro/Psych neg Seizures    GI/Hepatic Neg liver ROS, neg GERD  ,  Endo/Other  neg diabetes  Renal/GU negative Renal ROS     Musculoskeletal   Abdominal   Peds  Hematology   Anesthesia Other Findings   Reproductive/Obstetrics                             Anesthesia Physical Anesthesia Plan  ASA: II and emergent  Anesthesia Plan: Spinal   Post-op Pain Management:    Induction:   PONV Risk Score and Plan:   Airway Management Planned:   Additional Equipment:   Intra-op Plan:   Post-operative Plan:   Informed Consent: I have reviewed the patients History and Physical, chart, labs and discussed the procedure including the risks, benefits and alternatives for the proposed anesthesia with the patient or authorized representative who has indicated his/her understanding and acceptance.     Plan Discussed with:   Anesthesia Plan Comments:         Anesthesia Quick Evaluation

## 2017-02-12 NOTE — Anesthesia Procedure Notes (Addendum)
Date/Time: 02/12/2017 10:50 AM Performed by: Ginger CarneMichelet, Shauntavia Brackin, CRNA Pre-anesthesia Checklist: Patient identified, Emergency Drugs available, Suction available, Patient being monitored and Timeout performed Patient Re-evaluated:Patient Re-evaluated prior to induction Oxygen Delivery Method: Simple face mask Preoxygenation: Pre-oxygenation with 100% oxygen

## 2017-02-12 NOTE — Anesthesia Post-op Follow-up Note (Signed)
Anesthesia QCDR form completed.        

## 2017-02-12 NOTE — Progress Notes (Signed)
15 minute call to floor. 

## 2017-02-12 NOTE — H&P (Signed)
ORTHOPAEDIC HISTORY AND PHYSICAL  REQUESTING PHYSICIAN: Merrily Brittleifenbark, Neil, MD  Chief Complaint:   L tibia pain  History of Present Illness: [name] Carrillo is a 51 y.o. female who suffered a fall last night at home when her dog clipped her and she twisted her leg while falling. She did not think her injury was too severe so she did not call EMS until this morning. Radiographs in the ED show mid-shaft tibia fracture with proximal fibula fracture. She notes 10/10 pain with any movement. Rest makes pain mildly better. Pain is located over mid-shaft of her tibia.   Of note, she had a prior Grade I open R tibia fracture that Dr. Rosita KeaMenz did an IM nail for in 2014. She is doing well from that side.   She does not smoke. She has been NPO since 6pm last night.   History reviewed. No pertinent past medical history. Past Surgical History:  Procedure Laterality Date  . right leg surgery     Social History   Socioeconomic History  . Marital status: Married    Spouse name: None  . Number of children: None  . Years of education: None  . Highest education level: None  Social Needs  . Financial resource strain: None  . Food insecurity - worry: None  . Food insecurity - inability: None  . Transportation needs - medical: None  . Transportation needs - non-medical: None  Occupational History  . None  Tobacco Use  . Smoking status: Former Games developermoker  . Smokeless tobacco: Never Used  Substance and Sexual Activity  . Alcohol use: Yes  . Drug use: No  . Sexual activity: None  Other Topics Concern  . None  Social History Narrative  . None   No family history on file. No Known Allergies Prior to Admission medications   Not on File   Dg Tibia/fibula Left  Result Date: 02/12/2017 CLINICAL DATA:  Post fall, now with left leg pain EXAM: LEFT TIBIA AND FIBULA - 2 VIEW COMPARISON:  None. FINDINGS: There are comminuted obliquely oriented  displaced fractures involving the midshaft of the tibia and proximal metaphysis of the fibula with associated foreshortening and posterolateral deviation of the distal fracture fragments. The fibular fracture likely extends to involve the proximal tib-fib joint. Limited visualization of the adjacent knee and ankle is otherwise normal given obliquity. Expected adjacent soft tissue swelling.  No radiopaque foreign body. IMPRESSION: Comminuted, displaced fractures of the midshaft of the tibia and proximal metaphysis of the fibula. Electronically Signed   By: Simonne ComeJohn  Watts M.D.   On: 02/12/2017 08:36    Positive ROS: All other systems have been reviewed and were otherwise negative with the exception of those mentioned in the HPI and as above.  Physical Exam: General:  Alert, no acute distress Psychiatric:  Patient is competent for consent with normal mood and affect   Cardiovascular:  No pedal edema, regular rate and rhythm Respiratory:  No wheezing, non-labored breathing, chest sounds clear GI:  Abdomen is soft and non-tender Skin:  No lesions in the area of chief complaint, mild erythema about tibial mid-shaft Neurologic:  Sensation intact distally, CN grossly intact Lymphatic:  No axillary or cervical lymphadenopathy  Orthopedic Exam:  LLE:  - gross deformity of tibia - able to wiggle toes, has slight pf/df of ankle, limited due to pain - SILT in s/s/t/sp/dp distr - foot wwp, + distal pulses - ttp over mid-shaft tibia  X-rays:  As above - L tibia mid-shaft spiral fracture  and proximal fibula fracture  Assessment: 51 yo F w/mid-shaft tibia fracture and proximal fibula fracture  Plan: 1. Admit to Orthopedic service 2. Plan for OR for IM nailing of L tibia 3. NPO until OR 4. NWB LLE   Signa KellSunny Yeily Link   02/12/2017 10:17 AM

## 2017-02-12 NOTE — ED Notes (Signed)
PT resting comfortably. Pain under control. Updated on plan.

## 2017-02-13 ENCOUNTER — Encounter: Payer: Self-pay | Admitting: Orthopedic Surgery

## 2017-02-13 LAB — CBC
HEMATOCRIT: 34.4 % — AB (ref 35.0–47.0)
Hemoglobin: 11.9 g/dL — ABNORMAL LOW (ref 12.0–16.0)
MCH: 37.9 pg — ABNORMAL HIGH (ref 26.0–34.0)
MCHC: 34.7 g/dL (ref 32.0–36.0)
MCV: 109 fL — ABNORMAL HIGH (ref 80.0–100.0)
Platelets: 198 10*3/uL (ref 150–440)
RBC: 3.15 MIL/uL — ABNORMAL LOW (ref 3.80–5.20)
RDW: 13.8 % (ref 11.5–14.5)
WBC: 7.9 10*3/uL (ref 3.6–11.0)

## 2017-02-13 LAB — BASIC METABOLIC PANEL
ANION GAP: 6 (ref 5–15)
BUN: 8 mg/dL (ref 6–20)
CALCIUM: 8.4 mg/dL — AB (ref 8.9–10.3)
CO2: 26 mmol/L (ref 22–32)
Chloride: 104 mmol/L (ref 101–111)
Creatinine, Ser: 0.68 mg/dL (ref 0.44–1.00)
Glucose, Bld: 119 mg/dL — ABNORMAL HIGH (ref 65–99)
Potassium: 3.8 mmol/L (ref 3.5–5.1)
Sodium: 136 mmol/L (ref 135–145)

## 2017-02-13 MED ORDER — OXYCODONE HCL 5 MG PO TABS: 5.0000 mg | ORAL_TABLET | ORAL | 0 refills | Status: DC | PRN

## 2017-02-13 MED ORDER — ASPIRIN EC 325 MG PO TBEC: 325.0000 mg | DELAYED_RELEASE_TABLET | Freq: Every day | ORAL | 0 refills | Status: AC

## 2017-02-13 NOTE — Progress Notes (Signed)
Patient is alert and oriented and able to verbalize needs. No complaints of pain at this time. PIV removed. VSS. Discharge instructions gone over with patient at this time. Printed AVS and rx for oxycodone and aspirin given to patient. Office will call patient with f/u appt. Patient verbalizes understanding of all discharge instructions and follow up care. No concerns voiced. ALL belongings packed up and patient's husband is on the way to transport her home.   Suzan SlickAlison L Tirrell Buchberger, RN

## 2017-02-13 NOTE — Anesthesia Postprocedure Evaluation (Signed)
Anesthesia Post Note  Patient: Valarie Merinoanya R Hovatter  Procedure(s) Performed: INTRAMEDULLARY (IM) NAIL TIBIAL (Left )  Patient location during evaluation: Nursing Unit Anesthesia Type: Spinal Level of consciousness: oriented and awake and alert Pain management: pain level controlled Vital Signs Assessment: post-procedure vital signs reviewed and stable Respiratory status: spontaneous breathing, respiratory function stable and patient connected to nasal cannula oxygen Cardiovascular status: blood pressure returned to baseline and stable Postop Assessment: no headache, no backache and no apparent nausea or vomiting Anesthetic complications: no     Last Vitals:  Vitals:   02/12/17 1807 02/12/17 1907  BP: (!) 93/58 (!) 118/51  Pulse: 78 80  Resp: 16 16  Temp: 37.6 C 36.7 C  SpO2: 94% 96%    Last Pain:  Vitals:   02/13/17 0700  TempSrc:   PainSc: 6                  Jules SchickLogan,  Waunita Sandstrom P

## 2017-02-13 NOTE — Discharge Summary (Signed)
Physician Discharge Summary  Patient ID: [name] Carrillo MRN: [id number] DOB/AGE: 52/10/1965 52 y.o.  Admit date: 02/12/2017 Discharge date: 02/13/2017  Admission Diagnoses:  Closed fracture of left tibia and fibula [S82.202A, S82.402A] Closed displaced spiral fracture of shaft of left tibia, initial encounter [S82.242A] Closed fracture of proximal end of left fibula, unspecified fracture morphology, initial encounter [X32.440N][S82.832A]   Discharge Diagnoses: Patient Active Problem List   Diagnosis Date Noted  . Tibia fracture 02/12/2017    History reviewed. No pertinent past medical history.   Transfusion: none   Consultants (if any):   Discharged Condition: Improved  Hospital Course: [name] Carrillo is an 51 y.o. female who was admitted 02/12/2017 with a diagnosis of left displaced tibia fracture and went to the operating room on 02/12/2017 and underwent the above named procedures.    Surgeries: Procedure(s): INTRAMEDULLARY (IM) NAIL TIBIAL on 02/12/2017 Patient tolerated the surgery well. Taken to PACU where she was stabilized and then transferred to the orthopedic floor.  Started on Lovenox 40 mg  q 24 hrs. No evidence of DVT. Negative Homan. Physical therapy started on day #1 for gait training and transfer.  Patient's foley was d/c on day #1. Patient's IV was d/c on day #1.  On post op day #1 patient was stable and ready for discharge to home.  Implants: see operative note  She was given perioperative antibiotics:  Anti-infectives (From admission, onward)   Start     Dose/Rate Route Frequency Ordered Stop   02/12/17 1800  ceFAZolin (ANCEF) 2 g in dextrose 5 % 100 mL IVPB     2 g 200 mL/hr over 30 Minutes Intravenous Every 8 hours 02/12/17 1417 02/13/17 0600   02/12/17 1415  ceFAZolin (ANCEF) IVPB 2g/100 mL premix  Status:  Discontinued     2 g 200 mL/hr over 30 Minutes Intravenous Every 8 hours 02/12/17 1411 02/12/17 1417    .  She was given sequential compression  devices, early ambulation, and Aspirin for DVT prophylaxis.  She benefited maximally from the hospital stay and there were no complications.    Recent vital signs:  Vitals:   02/12/17 1907 02/13/17 0743  BP: (!) 118/51 118/70  Pulse: 80 73  Resp: 16 12  Temp: 98 F (36.7 C) 98.7 F (37.1 C)  SpO2: 96% 94%    Recent laboratory studies:  Lab Results  Component Value Date   HGB 11.9 (L) 02/13/2017   HGB 14.0 02/12/2017   HGB 11.6 (L) 04/29/2012   Lab Results  Component Value Date   WBC 7.9 02/13/2017   PLT 198 02/13/2017   Lab Results  Component Value Date   INR 0.91 02/12/2017   Lab Results  Component Value Date   NA 136 02/13/2017   K 3.8 02/13/2017   CL 104 02/13/2017   CO2 26 02/13/2017   BUN 8 02/13/2017   CREATININE 0.68 02/13/2017   GLUCOSE 119 (H) 02/13/2017    Discharge Medications:   Allergies as of 02/13/2017   No Known Allergies     Medication List    TAKE these medications   aspirin EC 325 MG tablet Take 1 tablet (325 mg total) daily for 28 days by mouth.   oxyCODONE 5 MG immediate release tablet Commonly known as:  Oxy IR/ROXICODONE Take 1-2 tablets (5-10 mg total) every 4 (four) hours as needed by mouth for severe pain or breakthrough pain.       Diagnostic Studies: Dg Tibia/fibula Left  Result Date: 02/12/2017 CLINICAL DATA:  Left tibial IM nail EXAM: LEFT TIBIA AND FIBULA - 2 VIEW COMPARISON:  02/12/2017 FINDINGS: Multiple intraoperative views demonstrate intramedullary nail fixation of the midshaft LEFT femur fracture. Two proximal interlocking cortical screws. 2 distal cortical interlocking screws. Proximal fibular fracture noted. IMPRESSION: Intramedullary nail fixation of midshaft femur fracture. Electronically Signed   By: Genevive BiStewart  Edmunds M.D.   On: 02/12/2017 12:57   Dg Tibia/fibula Left  Result Date: 02/12/2017 CLINICAL DATA:  Post fall, now with left leg pain EXAM: LEFT TIBIA AND FIBULA - 2 VIEW COMPARISON:  None. FINDINGS:  There are comminuted obliquely oriented displaced fractures involving the midshaft of the tibia and proximal metaphysis of the fibula with associated foreshortening and posterolateral deviation of the distal fracture fragments. The fibular fracture likely extends to involve the proximal tib-fib joint. Limited visualization of the adjacent knee and ankle is otherwise normal given obliquity. Expected adjacent soft tissue swelling.  No radiopaque foreign body. IMPRESSION: Comminuted, displaced fractures of the midshaft of the tibia and proximal metaphysis of the fibula. Electronically Signed   By: Simonne ComeJohn  Watts M.D.   On: 02/12/2017 08:36   Dg C-arm 61-120 Min-no Report  Result Date: 02/12/2017 Fluoroscopy was utilized by the requesting physician.  No radiographic interpretation.    Disposition:     Follow-up Information    Signa KellPatel, Sunny, MD. Go in 12 day(s).   Specialty:  Orthopedic Surgery Why:  10-14 days for staple removal and wound check Contact information: [address] [place] [id number] [phone]            Signed: Amador CunasGAINES, Kariel Skillman [facility] CenterCHRISTOPHER 02/13/2017, 8:13 AM

## 2017-02-13 NOTE — Care Management Note (Signed)
Case Management Note  Patient Details  Name: LINCY BELLES MRN: [id number] Date of Birth: [birth year]  Subjective/Objective:                  RNCM met with patient to discuss discharge planning. Patient states this has happened before and feels she will not need home health PT.  She will need a rolling walker as she does not have that anymore.  She admits that she does not have a PCP.  She plans to return home with her husband and work from home after about a week.   Action/Plan:   Rolling walker requested from [facility].   Expected Discharge Date:  02/13/17               Expected Discharge Plan:     In-House Referral:     Discharge planning Services  CM Consult  Post Acute Care Choice:  Durable Medical Equipment Choice offered to:  Patient  DME Arranged:  [facility]ford Rile rolling DME Agency:  [place]:    [place]:     Status of Service:  Completed, signed off  If discussed at [place] of Stay Meetings, dates discussed:    Additional Comments:  Marshell Garfinkel, RN 02/13/2017, 10:34 AM

## 2017-02-13 NOTE — Evaluation (Signed)
Physical Therapy Evaluation Patient Details Name: [name] Carrillo MRN: [id number] DOB: September 19, 1965 Today's Date: 02/13/2017   History of Present Illness  [name] Carrillo is a 51 y.o. female who suffered a fall at home when her dog clipped her and she twisted her leg while falling. She did not think her injury was too severe so she did not call EMS until this morning. Radiographs in the ED show mid-shaft tibia fracture with proximal fibula fracture. She underwent ORIF of L tibia and is POD#1 at time of PT evaluation. Pt reports a remote history of RLE fracture.   Clinical Impression  Pt admitted with above diagnosis. Pt currently with functional limitations due to the deficits listed below (see PT Problem List).  Pt is supervision only for bed mobility and CGA for transfers and ambulation. She ambulates with two point gait with rolling walker while hopping on LLE. Initially mildly unstable with ambulation but improves with increased distance. Pt becomes fatigued with ambulation. Peformed multiple bouts of ambulation with patient and she is steady and safe with rolling walker by the end of ambulation. She could benefit from a couple sessions of HH PT for safe home navigation and ADL training but currently refuses. Recommend rolling walker for ambulation. She would also benefit from a wheelchair for longer distances but currently declines. She is safe to return home when medically stable with support of family. Pt will benefit from PT services to address deficits in strength, balance, and mobility in order to return to full function at home.     Follow Up Recommendations Home health PT;Other (comment)(Pt declines)    Equipment Recommendations  Rolling walker with 5" wheels;Other (comment)(Declines wheelchair)    Recommendations for Other Services       Precautions / Restrictions Precautions Precautions: Fall Required Braces or Orthoses: Other Brace/Splint Other Brace/Splint: LLE posterior  splint Restrictions Weight Bearing Restrictions: Yes LLE Weight Bearing: Non weight bearing      Mobility  Bed Mobility Overal bed mobility: Needs Assistance Bed Mobility: Supine to Sit     Supine to sit: Supervision     General bed mobility comments: Cues to move bilateral LE's to EOB. Pt moves slowly. HOB elevated and bed rails utilized  Transfers Overall transfer level: Needs assistance Equipment used: Rolling walker (2 wheeled) Transfers: Sit to/from Stand Sit to Stand: Min guard         General transfer comment: Pt requires cues for safe hand placement with transfers. Moves slowly but improves with repeated practice. Cues for NWB on LLE during transfers. Pt reports increase in LLE pain in dependent position  Ambulation/Gait Ambulation/Gait assistance: Min guard Ambulation Distance (Feet): 40 Feet Assistive device: Rolling walker (2 wheeled)   Gait velocity: Decreased Gait velocity interpretation: <1.8 ft/sec, indicative of risk for recurrent falls General Gait Details: Pt ambulates with two point gait with rolling walker while hopping on LLE. Initially mildly unstable with ambulation but improves with increased distance. Pt becomes fatigued with ambulation. Peformed multiple bouts of ambulation with patient and she is steady and safe with rolling walker by the end of ambulation  Stairs Stairs: Yes Stairs assistance: Min assist;+2 physical assistance Stair Management: Two rails;With walker Number of Stairs: 1 General stair comments: Attempted stairs in forward fashion with bilateral rails. Pt is very unsteady with this and requires heavy assist from therapist and aid. Instead practiced backwards ascend with one step and pt is able to perform with better sequencing and safety.   Wheelchair Mobility  Modified Rankin (Stroke Patients Only)       Balance Overall balance assessment: Needs assistance Sitting-balance support: No upper extremity supported Sitting  balance-Leahy Scale: Good     Standing balance support: Bilateral upper extremity supported Standing balance-Leahy Scale: Fair Standing balance comment: Able to maintain static standing balance with bilateral UE support on walker                             Pertinent Vitals/Pain Pain Assessment: 0-10 Pain Score: 2  Pain Location: Left lower extremity, pain increases significantly with movement Pain Intervention(s): Monitored during session;Premedicated before session    Home Living Family/patient expects to be discharged to:: Private residence Living Arrangements: Spouse/significant other Available Help at Discharge: Family Type of Home: House Home Access: Stairs to enter Entrance Stairs-Rails: Left(post) Secretary/administratorntrance Stairs-Number of Steps: 2(1+1 with landing between) Home Layout: One level Home Equipment: Shower seat(no walker, no BSC)      Prior Function Level of Independence: Independent         Comments: Independent with ADLs/IADLs, drives. No other falls in the last 12 months. Working     Higher education careers adviserHand Dominance   Dominant Hand: Right    Extremity/Trunk Assessment   Upper Extremity Assessment Upper Extremity Assessment: Overall WFL for tasks assessed    Lower Extremity Assessment Lower Extremity Assessment: LLE deficits/detail LLE Deficits / Details: Pt reports intact sensation to LLE and with light touch over toes. Unable to perform SLR without assist from therapist       Communication   Communication: No difficulties  Cognition Arousal/Alertness: Awake/alert Behavior During Therapy: WFL for tasks assessed/performed Overall Cognitive Status: Within Functional Limits for tasks assessed                                        General Comments      Exercises     Assessment/Plan    PT Assessment Patient needs continued PT services  PT Problem List Decreased strength;Decreased activity tolerance;Decreased balance;Decreased  mobility;Pain       PT Treatment Interventions DME instruction;Gait training;Stair training;Functional mobility training;Therapeutic activities;Balance training;Therapeutic exercise;Neuromuscular re-education;Patient/family education    PT Goals (Current goals can be found in the Care Plan section)  Acute Rehab PT Goals Patient Stated Goal: Return to prior function and return to work PT Goal Formulation: With patient Time For Goal Achievement: 02/27/17 Potential to Achieve Goals: Good    Frequency BID   Barriers to discharge Inaccessible home environment 2 steps to enter, pt able to demonstrate safety with therapist with practice    Co-evaluation               AM-PAC PT "6 Clicks" Daily Activity  Outcome Measure Difficulty turning over in bed (including adjusting bedclothes, sheets and blankets)?: A Little Difficulty moving from lying on back to sitting on the side of the bed? : A Little Difficulty sitting down on and standing up from a chair with arms (e.g., wheelchair, bedside commode, etc,.)?: A Little Help needed moving to and from a bed to chair (including a wheelchair)?: A Little Help needed walking in hospital room?: A Little Help needed climbing 3-5 steps with a railing? : Total 6 Click Score: 16    End of Session Equipment Utilized During Treatment: Gait belt Activity Tolerance: Patient tolerated treatment well Patient left: with call bell/phone within reach;Other (comment);with nursing/sitter in  room[facility] Nurse Communication: Other (comment)(DC plan discussed with Care Manager) PT Visit Diagnosis: Unsteadiness on feet (R26.81);Muscle weakness (generalized) (M62.81);History of falling (Z91.81);Difficulty in walking, not elsewhere classified (R26.2);Pain Pain - Right/Left: Left Pain - part of body: Leg    Time: [id number] PT Time Calculation (min) (ACUTE ONLY): 36 min   Charges:   PT Evaluation $PT Eval Low Complexity: 1 Low PT Treatments $Gait  Training: 8-22 mins   PT G Codes:   PT G-Codes **NOT FOR INPATIENT CLASS** Functional Assessment Tool Used: AM-PAC 6 Clicks Basic Mobility Functional Limitation: Mobility: Walking and moving around Mobility: Walking and Moving Around Current Status ([id number]([id number]): At least 40 percent but less than 60 percent impaired, limited or restricted Mobility: Walking and Moving Around Goal Status [phone](G8979): At least 1 percent but less than 20 percent impaired, limited or restricted    Lynnea MaizesJason D Huprich PT, DPT    Huprich,Jason 02/13/2017, 11:23 AM

## 2017-02-13 NOTE — Discharge Instructions (Signed)
Diet: As you were doing prior to hospitalization   Dressing:  Keep dressing on and clean/dry at all times   Activity:  Increase activity slowly as tolerated, but follow the weight bearing instructions below.  No lifting or driving for 6 weeks.  Weight Bearing:   Non weight bearing left lower extremity   To prevent constipation: you may use a stool softener such as -  Colace (over the counter) 100 mg by mouth twice a day  Drink plenty of fluids (prune juice may be helpful) and high fiber foods Miralax (over the counter) for constipation as needed.    Itching:  If you experience itching with your medications, try taking only a single pain pill, or even half a pain pill at a time.  You may take up to 10 pain pills per day, and you can also use benadryl over the counter for itching or also to help with sleep.   Precautions:  If you experience chest pain or shortness of breath - call 911 immediately for transfer to the hospital emergency department!!  If you develop a fever greater that 101 F, purulent drainage from wound, increased redness or drainage from wound, or calf pain-Call [facility]                                               Follow- Up Appointment:  [facility]

## 2017-02-13 NOTE — Evaluation (Signed)
Occupational Therapy Evaluation Patient Details Name: [name] Carrillo MRN: [id number] DOB: [birth year] Today's Date: 02/13/2017    History of Present Illness [name] Carrillo is a 51 y.o. female who suffered a fall at home when her dog clipped her and she twisted her leg while falling. She did not think her injury was too severe so she did not call EMS until this morning. Radiographs in the ED show mid-shaft tibia fracture with proximal fibula fracture. She underwent ORIF of L tibia and is POD#1 at time of therapy evaluations. Pt reports a remote history of RLE fracture.    Clinical Impression   Pt seen for OT evaluation this date. Pt was independent prior to fall off back porch resulting in above injury and surgical repair. Pt educated in AE/DME and home/routines modifications to maximize safety and functional independence with ADL tasks. Pt verbalizes understanding. Politely declines need for [facility] and additional OT services. Pt verbalizes family at home able to provide needed level of assist. Will sign off.     Follow Up Recommendations  No OT follow up    Equipment Recommendations  None recommended by OT    Recommendations for Other Services       Precautions / Restrictions Precautions Precautions: Fall Required Braces or Orthoses: Other Brace/Splint Other Brace/Splint: LLE posterior splint Restrictions Weight Bearing Restrictions: Yes LLE Weight Bearing: Non weight bearing      Mobility Bed Mobility Overal bed mobility: Needs Assistance Bed Mobility: Supine to Sit;Sit to Supine     Supine to sit: Supervision Sit to supine: Supervision   General bed mobility comments: Cues to move bilateral LE's to EOB. Pt moves slowly. HOB elevated and bed rails utilized  Transfers Overall transfer level: Needs assistance Equipment used: Rolling walker (2 wheeled) Transfers: Sit to/from Stand Sit to Stand: Min guard         General transfer comment: Pt requires cues for safe hand  placement with transfers. Moves slowly but improves with repeated practice. Cues for NWB on LLE during transfers. Pt reports increase in LLE pain in dependent position    Balance Overall balance assessment: Needs assistance Sitting-balance support: No upper extremity supported Sitting balance-Leahy Scale: Good     Standing balance support: Bilateral upper extremity supported Standing balance-Leahy Scale: Fair Standing balance comment: Able to maintain static standing balance with bilateral UE support on walker                           ADL either performed or assessed with clinical judgement   ADL Overall ADL's : Needs assistance/impaired Eating/Feeding: Sitting;Set up   Grooming: Sitting;Set up   Upper Body Bathing: Set up;Sitting   Lower Body Bathing: Sitting/lateral leans;Minimal assistance;With caregiver independent assisting   Upper Body Dressing : Set up;Sitting   Lower Body Dressing: Minimal assistance;Sit to/from stand;With caregiver independent assisting Lower Body Dressing Details (indicate cue type and reason): pt educated in AE for LB dressing with verbal instruction and visual demonstration. Pt verbalizes understanding.               General ADL Comments: pt generally min assist for LB ADL tasks, pt verbalizes family able to provide needed level of assist     Vision Baseline Vision/History: Wears glasses Wears Glasses: Reading only Patient Visual Report: No change from baseline Vision Assessment?: No apparent visual deficits     Perception     Praxis      Pertinent Vitals/Pain Pain Assessment: 0-10  Pain Score: 7  Pain Location: L leg Pain Descriptors / Indicators: Aching;Grimacing;Guarding;Restless Pain Intervention(s): RN gave pain meds during session;Monitored during session;Limited activity within patient's tolerance     Hand Dominance Right   Extremity/Trunk Assessment Upper Extremity Assessment Upper Extremity Assessment:  Overall WFL for tasks assessed   Lower Extremity Assessment Lower Extremity Assessment: Defer to PT evaluation;LLE deficits/detail LLE Deficits / Details: intact sensation to LLE and with light touch over toes. Unable to perform SLR without assist from therapist   Cervical / Trunk Assessment Cervical / Trunk Assessment: Normal   Communication Communication Communication: No difficulties   Cognition Arousal/Alertness: Awake/alert Behavior During Therapy: WFL for tasks assessed/performed Overall Cognitive Status: Within Functional Limits for tasks assessed                                     General Comments       Exercises Other Exercises Other Exercises: pt educated in home/routines modifications to maximize safety and functional independence with ADL tasks. Pt verbalizes understanding. Politely declines need for BSC.   Shoulder Instructions      Home Living Family/patient expects to be discharged to:: Private residence Living Arrangements: Spouse/significant other Available Help at Discharge: Family Type of Home: House Home Access: Stairs to enter Entergy CorporationEntrance Stairs-Number of Steps: 2 (1+1 with landing) Entrance Stairs-Rails: Left(post) Home Layout: One level     Bathroom Shower/Tub: Producer, television/film/videoWalk-in shower   Bathroom Toilet: Standard     Home Equipment: Shower seat          Prior Functioning/Environment Level of Independence: Independent        Comments: Independent with ADLs/IADLs, drives. No other falls in the last 12 months. Working (primarily desk work)        OT Problem List:        OT Treatment/Interventions:      OT Goals(Current goals can be found in the care plan section) Acute Rehab OT Goals Patient Stated Goal: Return to prior function and return to work OT Goal Formulation: All assessment and education complete, DC therapy  OT Frequency:     Barriers to D/C:            Co-evaluation              AM-PAC PT "6 Clicks"  Daily Activity     Outcome Measure Help from another person eating meals?: None Help from another person taking care of personal grooming?: None Help from another person toileting, which includes using toliet, bedpan, or urinal?: A Little Help from another person bathing (including washing, rinsing, drying)?: A Little Help from another person to put on and taking off regular upper body clothing?: None Help from another person to put on and taking off regular lower body clothing?: A Little 6 Click Score: 21   End of Session    Activity Tolerance: Patient tolerated treatment well Patient left: in bed;with call bell/phone within reach;with bed alarm set  OT Visit Diagnosis: Other abnormalities of gait and mobility (R26.89);Pain Pain - Right/Left: Left Pain - part of body: Leg                Time: [id number] OT Time Calculation (min): 12 min Charges:  OT General Charges $OT Visit: 1 Visit OT Evaluation $OT Eval Low Complexity: 1 Low G-Codes: OT G-codes **NOT FOR INPATIENT CLASS** Functional Assessment Tool Used: AM-PAC 6 Clicks Daily Activity;Clinical judgement Functional Limitation: Self care Self  Care Current Status [phone]([id number]): At least 20 percent but less than 40 percent impaired, limited or restricted Self Care Goal Status ([id number](G8988): At least 20 percent but less than 40 percent impaired, limited or restricted Self Care Discharge Status [phone]): At least 20 percent but less than 40 percent impaired, limited or restricted   Richrd PrimeJamie Stiller, MPH, MS, OTR/L ascom [phone] 02/13/17, 11:40 AM

## 2017-02-13 NOTE — Progress Notes (Addendum)
   Subjective: 1 Day Post-Op Procedure(s) (LRB): INTRAMEDULLARY (IM) NAIL TIBIAL (Left) Patient reports pain as mild.   Patient is well, and has had no acute complaints or problems Denies any CP, SOB, ABD pain. We will start therapy today.  Plan is to go Home after hospital stay.  Objective: Vital signs in last 24 hours: Temp:  [97.7 F (36.5 C)-99.6 F (37.6 C)] 98.7 F (37.1 C) (11/12 0743) Pulse Rate:  [62-90] 73 (11/12 0743) Resp:  [12-19] 12 (11/12 0743) BP: (93-150)/(51-83) 118/70 (11/12 0743) SpO2:  [93 %-100 %] 94 % (11/12 0743) Weight:  [81.6 kg (180 lb)] 81.6 kg (180 lb) (11/11 0812)  Intake/Output from previous day: 11/11 0701 - 11/12 0700 In: 2438.8 [P.O.:240; I.V.:1998.8; IV Piggyback:200] Out: 100 [Blood:100] Intake/Output this shift: No intake/output data recorded.  Recent Labs    02/12/17 0809 02/13/17 0329  HGB 14.0 11.9*   Recent Labs    02/12/17 0809 02/13/17 0329  WBC 10.2 7.9  RBC 3.85 3.15*  HCT 41.5 34.4*  PLT 261 198   Recent Labs    02/12/17 0809 02/13/17 0329  NA 134* 136  K 3.9 3.8  CL 101 104  CO2 21* 26  BUN 8 8  CREATININE 0.80 0.68  GLUCOSE 133* 119*  CALCIUM 9.3 8.4*   Recent Labs    02/12/17 0809  INR 0.91    EXAM General - Patient is Alert, Appropriate and Oriented Extremity - Neurovascular intact Sensation intact distally Intact pulses distally No cellulitis present Compartment soft Dressing - dressing C/D/I and no drainage splint intact Motor Function - intact, moving foot and toes well on exam.   History reviewed. No pertinent past medical history.  Assessment/Plan:   1 Day Post-Op Procedure(s) (LRB): INTRAMEDULLARY (IM) NAIL TIBIAL (Left) Active Problems:   Tibia fracture  Estimated body mass index is 29.95 kg/m as calculated from the following:   Height as of this encounter: 5\' 5"  (1.651 m).   Weight as of this encounter: 81.6 kg (180 lb). Advance diet Up with therapy , NWB LLE Discharge home  today if good progress with PT  DVT Prophylaxis - Aspirin, Foot Pumps and TED hose Non weight bearing to left leg   T. Cranston Neighborhris Gaines, PA-C [facility] 02/13/2017, 8:07 AM

## 2017-02-13 NOTE — Progress Notes (Deleted)
DATE OF SURGERY: 02/13/2017  PREOPERATIVE DIAGNOSIS:  1. Left tibia fracture 2. Left proximal fibula fracture  POSTOPERATIVE DIAGNOSIS:  1. Left tibia fracture 2. Left proximal fibula fracture  PROCEDURE: 1. Intramedullary nailing of Left tibia   SURGEON: Rosealee AlbeeSunny H. Kendalynn Wideman, MD  ASSISTANTS: none  EBL: 50cc  COMPONENTS:  [employer] T2 Nail: 9 x 315mm; 2 proximal interlocking screws; 2 distal interolocking screws.   INDICATIONS: [name] Carrillo is a 51 y.o. female who sustained a tibial fracture after a fall. X-rays in the ED showed a tibia and fibula fracture. Risks and benefits of intramedullary nailing of tibia were explained to the patient. Risks include but are not limited to bleeding, infection, injury to tissues, nerves, vessels, periprosthetic infection, dislocation, limb length discrepancy and risks of anesthesia. The patient understands these risks, has completed an informed consent, and wishes to proceed.   PROCEDURE:  The patient was brought into the operating room. After administering anesthesia, the patient was placed in the supine position on a radiolucent table. Anesthesia was administered. Preoperative IV antibiotics were administered. The leg was prepped with ChloraPrep solution before being draped sterilely in the standard fashion. A timeout was performed to verify the appropriate surgical site, patient, and procedure.   An approximately 5 cm incision was made proximal to the superior pole of the patella.  The quadriceps tendon was identified and a longitudinal incision was made in the midportion of the quadriceps tendon.  This allowed excellent access to the patellofemoral joint.  A protective sleeve was inserted and our starting point was found just medial to the lateral tibial spine on the AP view and just off of the anterior articular cartilage on the lateral view.  The guidepin was advanced appropriately.  An opening reamer was used to access the medullary canal of the  tibia.  Next, the fracture site was identified.  An approximately 6 cm incision was made over the fracture site.  Dissection was carried down sharply to the tibial fracture.  The bony edges were debrided of periosteum and other soft tissue.  A reduction clamp was then used to anatomically reduce the fracture and hold this in place.  Reduction was confirmed on fluoroscopy on both AP and lateral views.  Next a ball-tipped guidewire was placed into the medullary canal, across the fracture site, and down to the fascial scar at the ankle.  Next, we began sequential reaming starting with a 9mm reamer and advancing to a 10.5 mm reamer.  This gave excellent cortical chatter.  Next we used the sizer to measure the length of the nail and selected the nail size indicated above.  The nail was advanced to an appropriate position and reduction was maintained. Two 5.680mm proximal interlocking screws were placed in the static lateral and oblique medial positions in a standard fashion.  Next, two medial to lateral distal interlocking 4.0 millimeter screws were placed using the perfect circle technique.  We then confirmed appropriate fracture reduction fluoroscopically in both the AP and lateral planes.  On visual inspection of the fracture site there was a slight gap along the medial oblique segment.  1 cc of DBX bone putty was placed into this site.  The wounds were irrigated thoroughly with sterile saline solution.  The quadriceps tendon was closed with 0 Vicryl.  The quadriceps incision as well as the remainder of the wounds were closed with 2-0 Vicryl interrupted sutures in the dermis. The skin was closed using staples. Sterile occlusive dressings were applied to all wounds.  A short leg plaster splint was applied.  The patient was then transferred to the recovery room in satisfactory condition after tolerating the procedure well.  POSTOPERATIVE PLAN: The patient will be NWB on the operative extremity.  Can advance  weightbearing in 2 weeks at time of outpatient follow-up Lovenox 40mg /day while inpatient start on POD#1.  Can transition to ASA 325 mg twice daily after discharge.  Ancef x 24 hours. PT/OT on POD#1.

## 2017-02-14 LAB — HIV ANTIBODY (ROUTINE TESTING W REFLEX): HIV Screen 4th Generation wRfx: NONREACTIVE

## 2017-02-14 NOTE — Op Note (Signed)
DATE OF SURGERY: 02/14/2017  PREOPERATIVE DIAGNOSIS:  1. Left tibia fracture 2. Left proximal fibula fracture  POSTOPERATIVE DIAGNOSIS:  1. Left tibia fracture 2. Left proximal fibula fracture  PROCEDURE: 1. Intramedullary nailing of Left tibia   SURGEON: Rosealee AlbeeSunny H. Aymee Fomby, MD  ASSISTANTS: none  EBL: 50cc  COMPONENTS:  [employer] T2 Nail: 9 x 315mm; 2 proximal interlocking screws; 2 distal interolocking screws.   INDICATIONS: [name] Carrillo is a 51 y.o. female who sustained a tibial fracture after a fall. X-rays in the ED showed a tibia and fibula fracture. Risks and benefits of intramedullary nailing of tibia were explained to the patient. Risks include but are not limited to bleeding, infection, injury to tissues, nerves, vessels, periprosthetic infection, dislocation, limb length discrepancy and risks of anesthesia. The patient understands these risks, has completed an informed consent, and wishes to proceed.   PROCEDURE:  The patient was brought into the operating room. After administering anesthesia, the patient was placed in the supine position on a radiolucent table. Anesthesia was administered. Preoperative IV antibiotics were administered. The leg was prepped with ChloraPrep solution before being draped sterilely in the standard fashion. A timeout was performed to verify the appropriate surgical site, patient, and procedure.   An approximately 5 cm incision was made proximal to the superior pole of the patella.  The quadriceps tendon was identified and a longitudinal incision was made in the midportion of the quadriceps tendon.  This allowed excellent access to the patellofemoral joint.  A protective sleeve was inserted and our starting point was found just medial to the lateral tibial spine on the AP view and just off of the anterior articular cartilage on the lateral view.  The guidepin was advanced appropriately.  An opening reamer was used to access the medullary canal of the  tibia.  Next, the fracture site was identified.  An approximately 6 cm incision was made over the fracture site.  Dissection was carried down sharply to the tibial fracture.  The bony edges were debrided of periosteum and other soft tissue.  A reduction clamp was then used to anatomically reduce the fracture and hold this in place.  Reduction was confirmed on fluoroscopy on both AP and lateral views.  Next a ball-tipped guidewire was placed into the medullary canal, across the fracture site, and down to the fascial scar at the ankle.  Next, we began sequential reaming starting with a 9mm reamer and advancing to a 10.5 mm reamer.  This gave excellent cortical chatter.  Next we used the sizer to measure the length of the nail and selected the nail size indicated above.  The nail was advanced to an appropriate position and reduction was maintained. Two 5.100mm proximal interlocking screws were placed in the static lateral and oblique medial positions in a standard fashion.  Next, two medial to lateral distal interlocking 4.0 millimeter screws were placed using the perfect circle technique.  We then confirmed appropriate fracture reduction fluoroscopically in both the AP and lateral planes.  On visual inspection of the fracture site there was a slight gap along the medial oblique segment.  1 cc of DBX bone putty was placed into this site.  The wounds were irrigated thoroughly with sterile saline solution.  The quadriceps tendon was closed with 0 Vicryl.  The quadriceps incision as well as the remainder of the wounds were closed with 2-0 Vicryl interrupted sutures in the dermis. The skin was closed using staples. Sterile occlusive dressings were applied to all wounds.  A short leg plaster splint was applied.  The patient was then transferred to the recovery room in satisfactory condition after tolerating the procedure well.  POSTOPERATIVE PLAN: The patient will be NWB on the operative extremity.  Can advance  weightbearing in 2 weeks at time of outpatient follow-up Lovenox 40mg /day while inpatient start on POD#1.  Can transition to ASA 325 mg  daily after discharge.  Ancef x 24 hours. PT/OT on POD#1.

## 2017-03-07 ENCOUNTER — Encounter: Payer: Self-pay | Admitting: *Deleted

## 2017-03-07 ENCOUNTER — Other Ambulatory Visit: Payer: Self-pay

## 2017-03-07 ENCOUNTER — Encounter
Admission: RE | Admit: 2017-03-07 | Discharge: 2017-03-07 | Disposition: A | Discharge status: Home / Self Care | Payer: 59 | Source: Ambulatory Visit | Attending: Orthopedic Surgery | Admitting: Orthopedic Surgery

## 2017-03-07 HISTORY — DX: Major depressive disorder, single episode, unspecified: F32.9

## 2017-03-07 HISTORY — DX: Depression, unspecified: F32.A

## 2017-03-07 HISTORY — DX: Anxiety disorder, unspecified: F41.9

## 2017-03-07 MED ORDER — CLINDAMYCIN PHOSPHATE 900 MG/50ML IV SOLN: 900.0000 mg | Freq: Once | INTRAVENOUS | Status: DC

## 2017-03-07 NOTE — Patient Instructions (Addendum)
Your procedure is scheduled on: December 5th, 2018  [facility] at 6:00 am  Remember: Instructions that are not followed completely may result in serious medical risk, up to and including death, or upon the discretion of your surgeon and anesthesiologist your surgery may need to be rescheduled.     _X__ 1. Do not eat food after midnight the night before your procedure.                 No gum chewing or hard candies. You may drink clear liquids up to 2 hours                 before you are scheduled to arrive for your surgery- DO not drink clear                 liquids within 2 hours of the start of your surgery.                 Clear Liquids include:  water, apple juice without pulp, clear carbohydrate                 drink such as Clearfast of Gartorade, Black Coffee or Tea (Do not add                 anything to coffee or tea).     _X__ 2.  No Alcohol for 24 hours before or after surgery.   _X__ 3.  Do Not Smoke or use e-cigarettes For 24 Hours Prior to Your Surgery.                 Do not use any chewable tobacco products for at least 6 hours prior to                 surgery.  ____  4.  Bring all medications with you on the day of surgery if instructed.   ____  5.  Notify your doctor if there is any change in your medical condition      (cold, fever, infections).     Do not wear jewelry, make-up, hairpins, clips or nail polish. Do not wear lotions, powders, or perfumes. You may wear deodorant. Do not shave 48 hours prior to surgery. Men may shave face and neck. Do not bring valuables to the hospital.    [facility] belongings or valuables.  Contacts, dentures or bridgework may not be worn into surgery. Leave your suitcase in the car. After surgery it may be brought to your room. For patients admitted to the hospital, discharge time is determined by your treatment team.   Patients discharged the day of  surgery will not be allowed to drive home.   Please read over the following fact sheets that you were given:               Questions answered regarding surgery (phone interview)    ____ Take these medicines the morning of surgery with A SIP OF WATER:    1. Tylenol, if needed for pain  2.   3.   4.  5.  6.  ____ Fleet Enema (as directed)   __x__ Use ANTIBACTERIAL SOAP AS INSTRUCTED,  NIGHT BEFORE SURGERY AND MORNING OF SURGERY. PUT ON CLEAN PAJAMAS, CLEAN SHEETS AND PLEASE KEEP ALL ANIMALS OUT OF THE BED TONIGHT.   ____ Use inhalers on the day of surgery   ____ Stop metformin 2 days prior to surgery  ____ Take 1/2 of usual insulin dose the night before surgery. No insulin the morning          of surgery.   ____ Stop Coumadin/Plavix/ASPIRIN AS OF TODAY, 03/07/2017  ____ Stop Anti-inflammatories AS OF TODAY... THIS INCLUDES ALEVE/ IBUPROFEN / MOTRIN / ADVIL

## 2017-03-07 NOTE — OR Nursing (Signed)
Patient states that she has been all out of pain medication for a couple days. Instructed to stop Naprosyn tonight for surgery tomorrow. States that Tylenol does not help aleve any of the pain.

## 2017-03-08 ENCOUNTER — Inpatient Hospital Stay: Payer: 59 | Admitting: Anesthesiology

## 2017-03-08 ENCOUNTER — Inpatient Hospital Stay: Payer: 59

## 2017-03-08 ENCOUNTER — Inpatient Hospital Stay
Admission: RE | Admit: 2017-03-08 | Discharge: 2017-03-10 | DRG: 493 | Disposition: A | Discharge status: Home Health | Payer: 59 | Source: Ambulatory Visit | Attending: Orthopedic Surgery | Admitting: Orthopedic Surgery

## 2017-03-08 ENCOUNTER — Encounter
Admission: RE | Disposition: A | Discharge status: Home Health | Payer: Self-pay | Source: Ambulatory Visit | Attending: Orthopedic Surgery

## 2017-03-08 ENCOUNTER — Other Ambulatory Visit: Payer: Self-pay

## 2017-03-08 ENCOUNTER — Encounter: Payer: Self-pay | Admitting: *Deleted

## 2017-03-08 DIAGNOSIS — S82209A Unspecified fracture of shaft of unspecified tibia, initial encounter for closed fracture: Secondary | ICD-10-CM | POA: Diagnosis present

## 2017-03-08 DIAGNOSIS — Y838 Other surgical procedures as the cause of abnormal reaction of the patient, or of later complication, without mention of misadventure at the time of the procedure: Secondary | ICD-10-CM | POA: Diagnosis present

## 2017-03-08 DIAGNOSIS — Z419 Encounter for procedure for purposes other than remedying health state, unspecified: Secondary | ICD-10-CM

## 2017-03-08 DIAGNOSIS — F419 Anxiety disorder, unspecified: Secondary | ICD-10-CM | POA: Diagnosis present

## 2017-03-08 DIAGNOSIS — S82832G Other fracture of upper and lower end of left fibula, subsequent encounter for closed fracture with delayed healing: Secondary | ICD-10-CM | POA: Diagnosis not present

## 2017-03-08 DIAGNOSIS — F329 Major depressive disorder, single episode, unspecified: Secondary | ICD-10-CM | POA: Diagnosis present

## 2017-03-08 DIAGNOSIS — W19XXXA Unspecified fall, initial encounter: Secondary | ICD-10-CM | POA: Diagnosis present

## 2017-03-08 DIAGNOSIS — T84117A Breakdown (mechanical) of internal fixation device of bone of left lower leg, initial encounter: Principal | ICD-10-CM | POA: Diagnosis present

## 2017-03-08 DIAGNOSIS — M79662 Pain in left lower leg: Secondary | ICD-10-CM | POA: Diagnosis present

## 2017-03-08 DIAGNOSIS — S82202G Unspecified fracture of shaft of left tibia, subsequent encounter for closed fracture with delayed healing: Secondary | ICD-10-CM

## 2017-03-08 DIAGNOSIS — M868X6 Other osteomyelitis, lower leg: Secondary | ICD-10-CM | POA: Diagnosis present

## 2017-03-08 HISTORY — PX: HARDWARE REMOVAL: SHX979

## 2017-03-08 HISTORY — PX: APPLICATION OF WOUND VAC: SHX5189

## 2017-03-08 LAB — CBC
HEMATOCRIT: 34.8 % — AB (ref 35.0–47.0)
Hemoglobin: 11.9 g/dL — ABNORMAL LOW (ref 12.0–16.0)
MCH: 36.4 pg — ABNORMAL HIGH (ref 26.0–34.0)
MCHC: 34.1 g/dL (ref 32.0–36.0)
MCV: 107 fL — AB (ref 80.0–100.0)
Platelets: 353 10*3/uL (ref 150–440)
RBC: 3.26 MIL/uL — ABNORMAL LOW (ref 3.80–5.20)
RDW: 13.8 % (ref 11.5–14.5)
WBC: 11.6 10*3/uL — ABNORMAL HIGH (ref 3.6–11.0)

## 2017-03-08 LAB — CREATININE, SERUM
Creatinine, Ser: 0.61 mg/dL (ref 0.44–1.00)
GFR calc Af Amer: >60 mL/min (ref >60)
GFR calc non Af Amer: >60 mL/min (ref >60)

## 2017-03-08 LAB — SEDIMENTATION RATE: Sed Rate: 95 mm/hr — ABNORMAL HIGH (ref 0–30)

## 2017-03-08 IMAGING — XA DG C-ARM 61-120 MIN
1 series · 1 of 1 positions shown · non-contrast
Comparison: [DATE]

CLINICAL DATA: Elective surgery.

EXAM:
LEFT ANKLE - 2 VIEW; DG C-ARM 61-120 MIN

[Series 12: ortho standard · 1 of 1 slices shown]
[im 1/1]
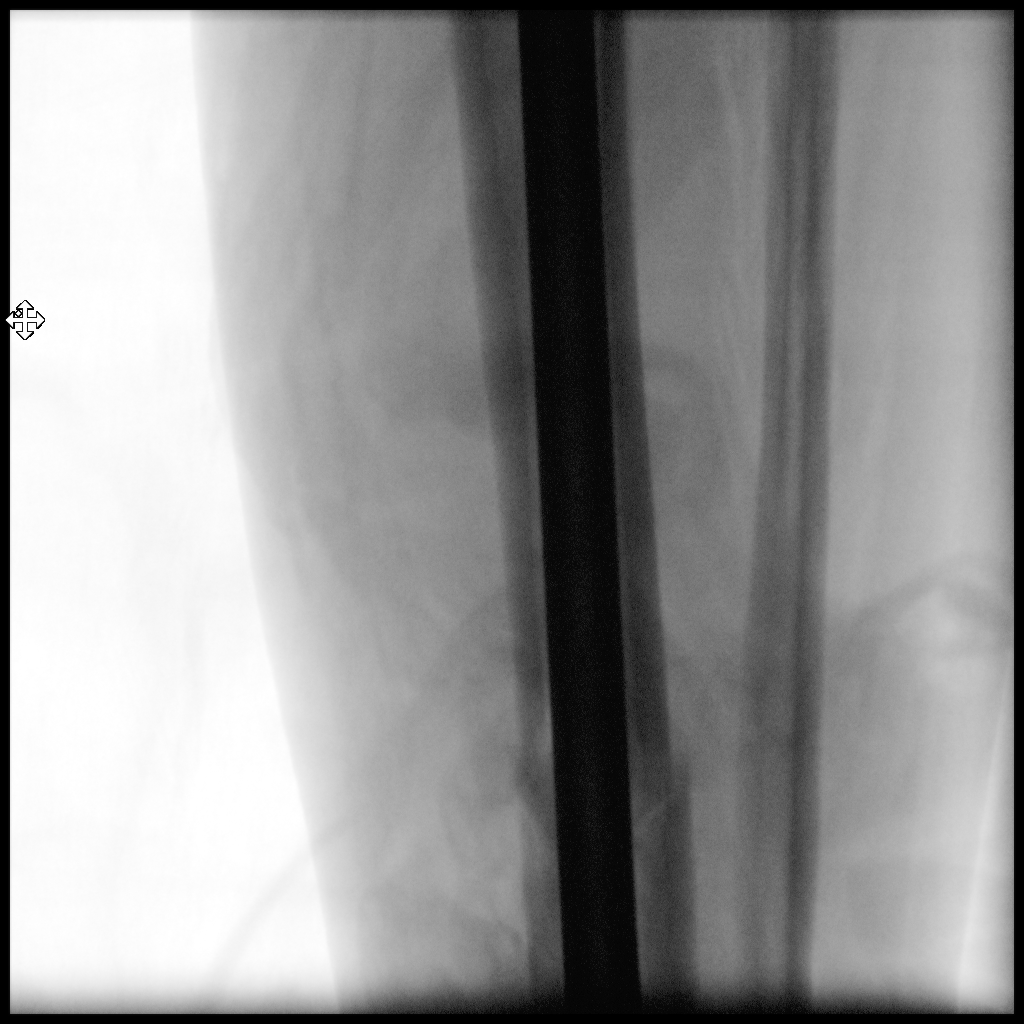

[1 of 1 positions shown; findings below may reference images not displayed]

FINDINGS: Tibial nail fixation of a shaft fracture with proximal and distal
interlocking screws. Good anatomic alignment. There is a fibular
neck fracture with mild anterior displacement.
IMPRESSION: 1. Fluoroscopy for tibial nail fixation.
2. Mildly displaced fibular neck fracture.
3. No unexpected finding.

## 2017-03-08 IMAGING — DX DG TIBIA/FIBULA PORT 2V*L*
4 series · 4 of 4 positions shown · non-contrast
Comparison: [DATE]

CLINICAL DATA: Status post tibial and fibular fractures

EXAM:
PORTABLE LEFT TIBIA AND FIBULA - 2 VIEW

[tibia ap (1 of 2)]
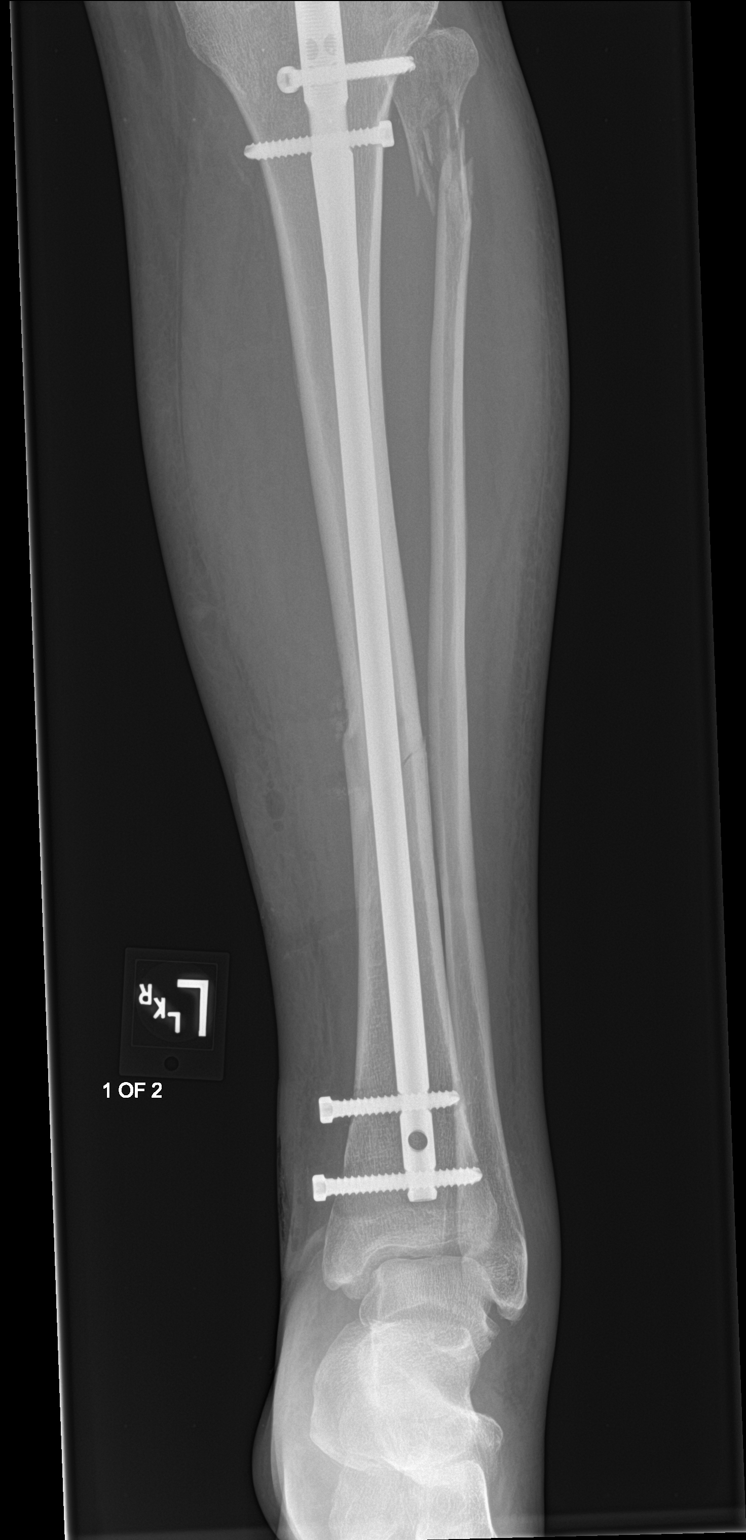

[tibia ap (2 of 2)]
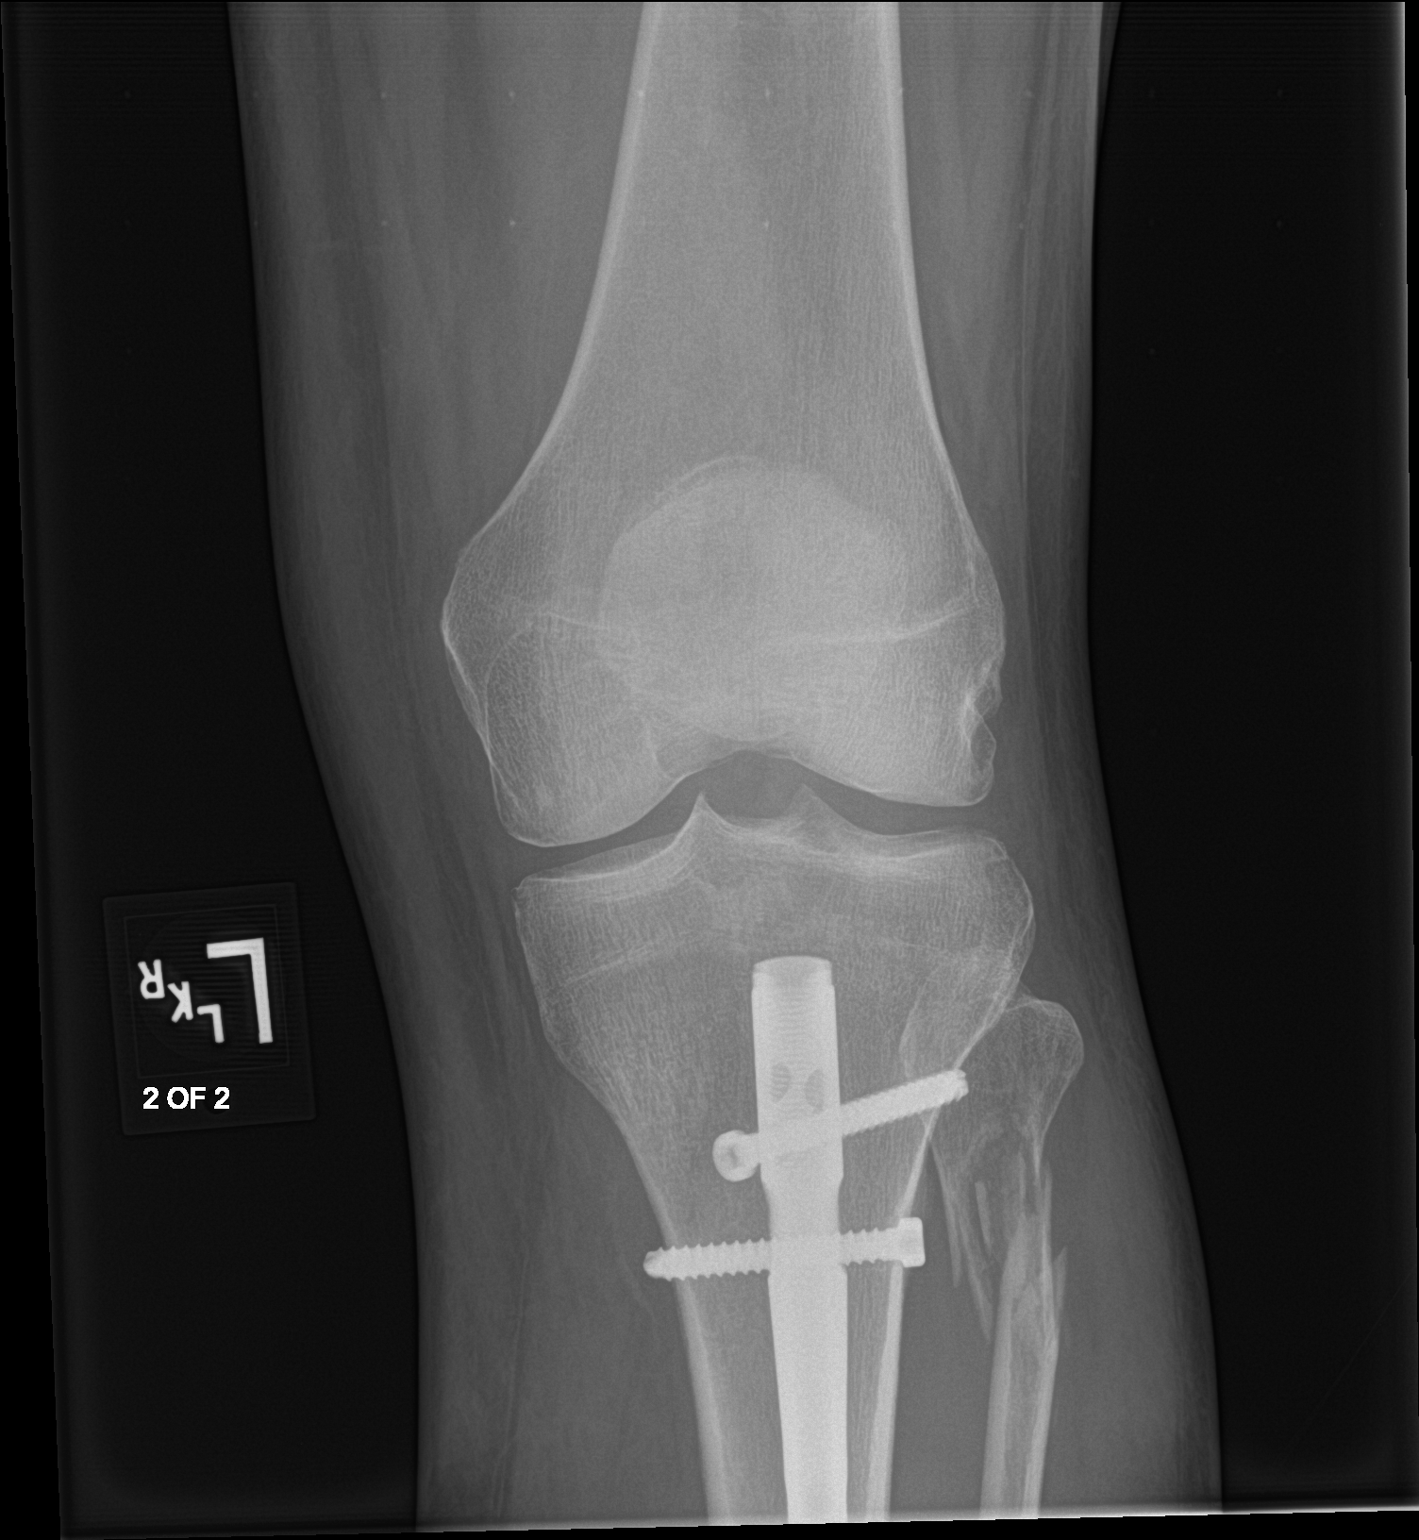

[tibia lat (1 of 2)]
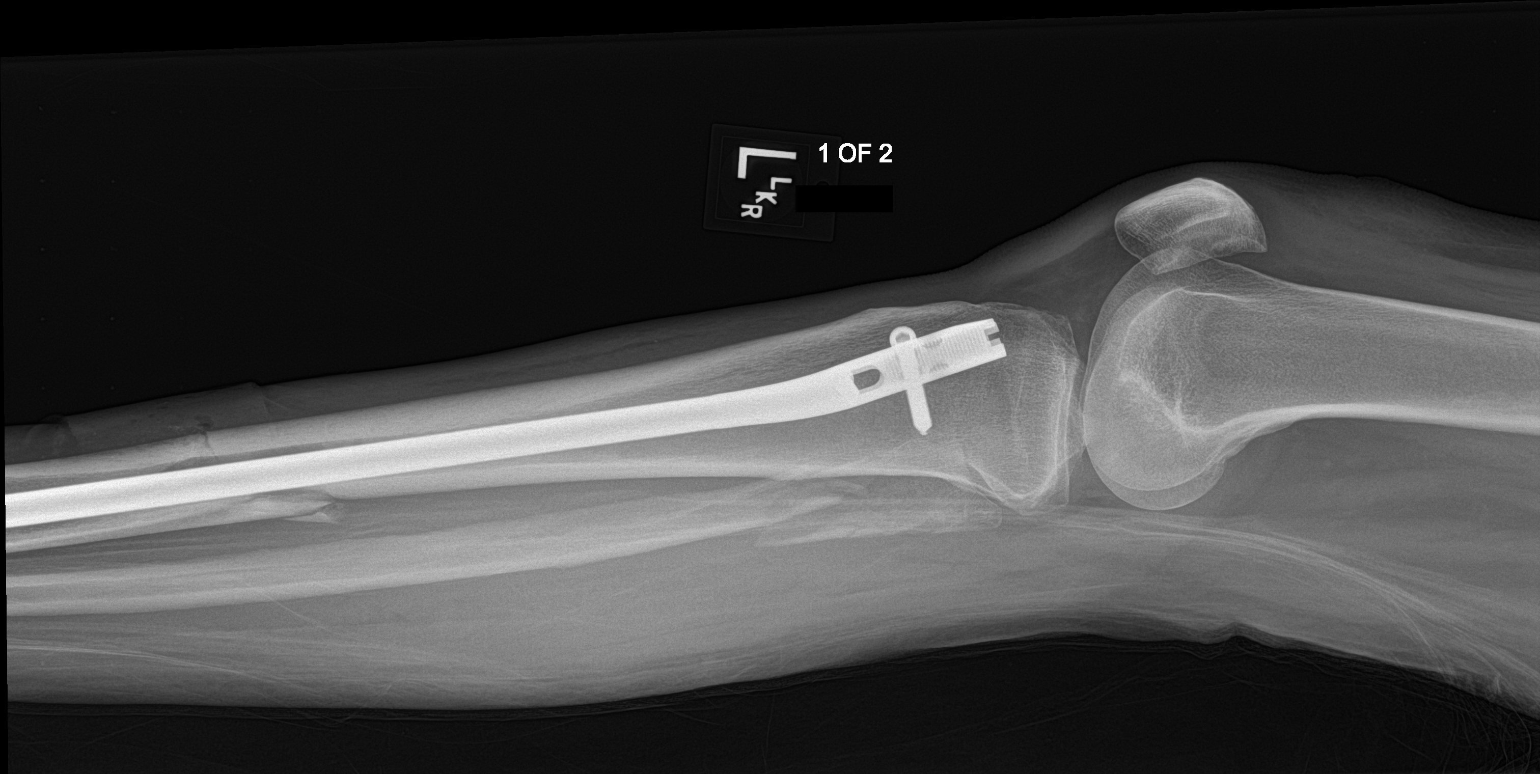

[tibia lat (2 of 2)]
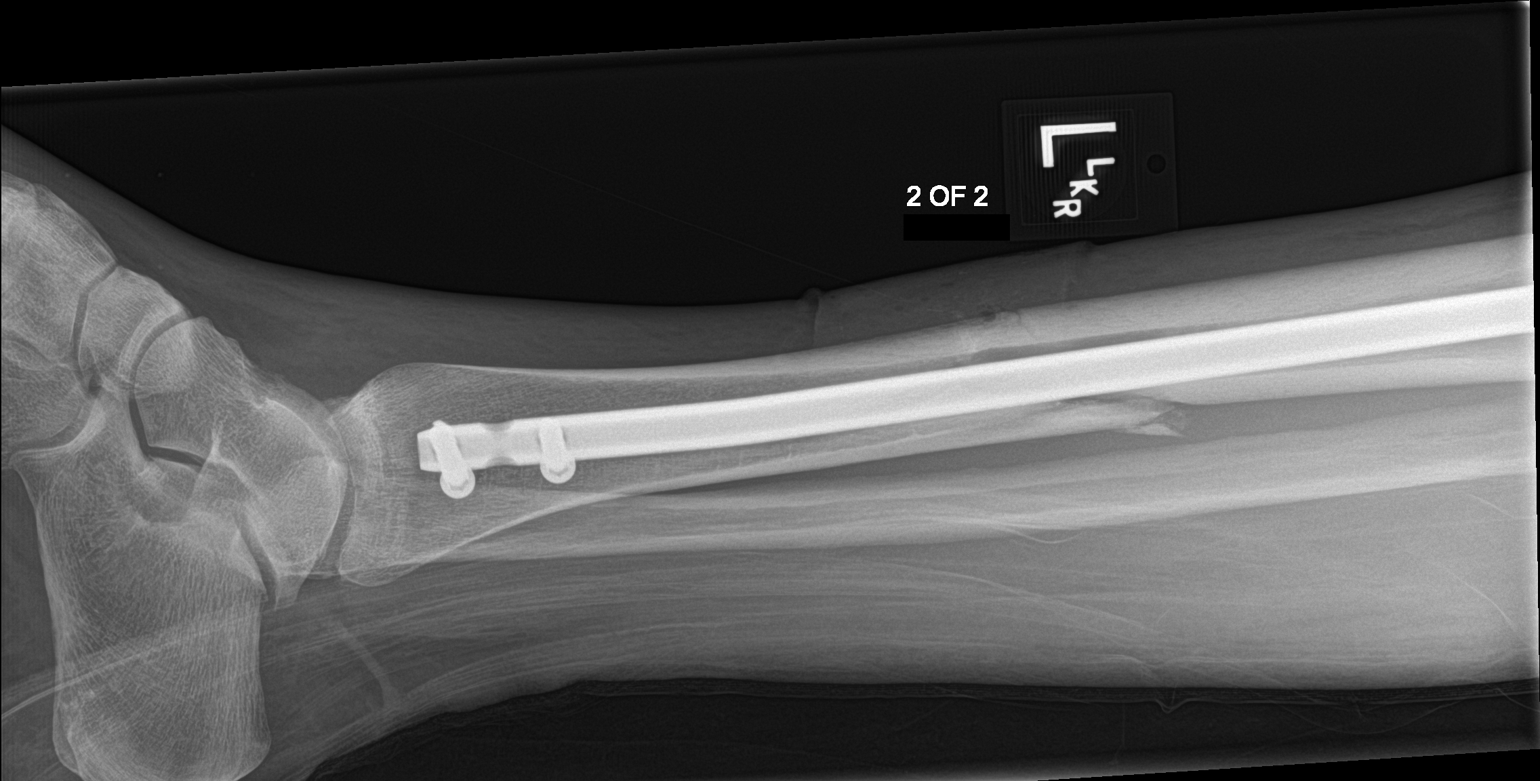

[4 of 4 positions shown; findings below may reference images not displayed]

FINDINGS: Medullary rod is again seen and stable. The distal fixation screws
appear to have withdrawn somewhat in the interval when compared with
the prior exam. The proximal fixation screws appear within normal
limits. No significant callus formation is noted. No other focal
abnormality is seen. The midshaft tibial and proximal fibular
fractures are again noted. Some air is noted in the medial soft
tissues of the mid lower leg.
IMPRESSION: Some slight withdrawal of fixation screws distally in the tibia. The
known tibial and fibular fractures are again seen and relatively
stable. Some air is noted within the medial soft tissues.

## 2017-03-08 IMAGING — XA DG C-ARM 61-120 MIN
1 series · 1 of 1 positions shown · non-contrast
Comparison: [DATE]

CLINICAL DATA: Elective surgery.

EXAM:
LEFT ANKLE - 2 VIEW; DG C-ARM 61-120 MIN

[Series 9: ortho standard · 1 of 1 slices shown]
[im 1/1]
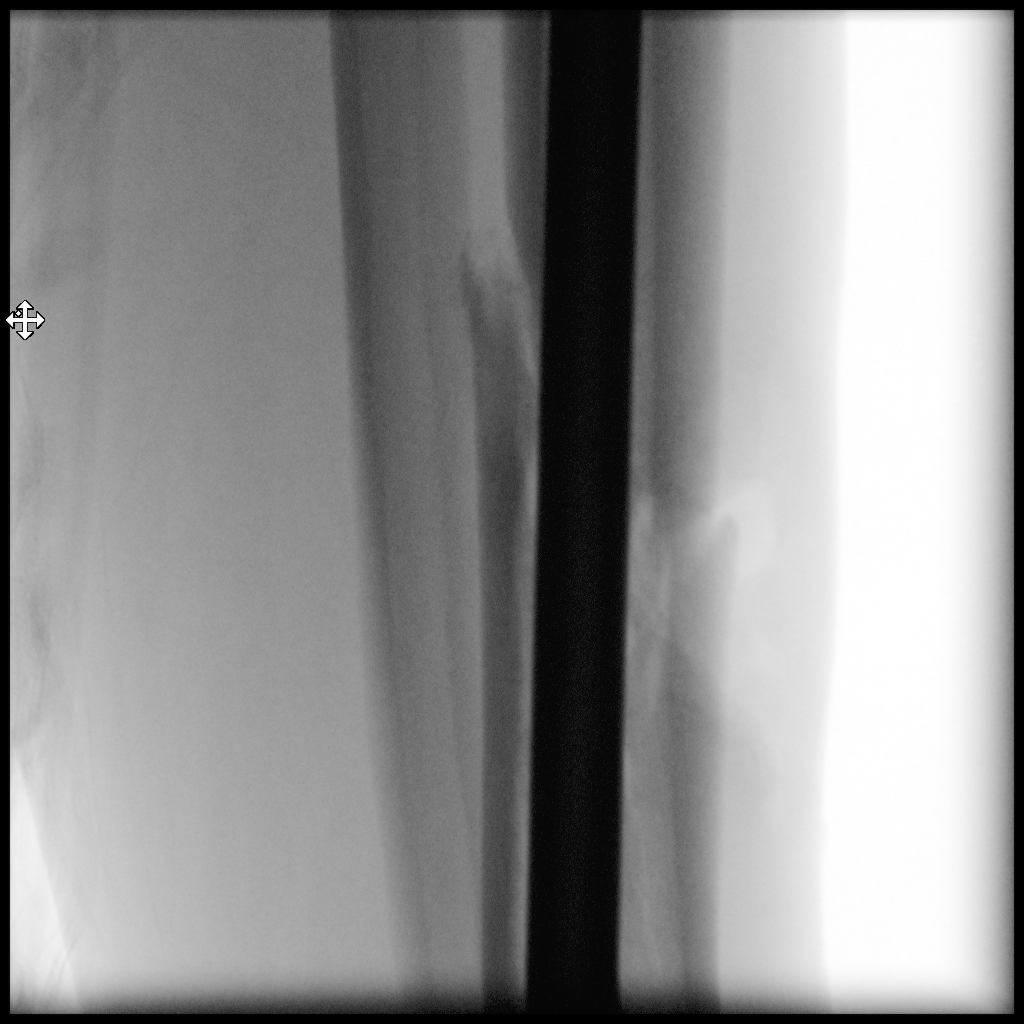

[1 of 1 positions shown; findings below may reference images not displayed]

FINDINGS: Tibial nail fixation of a shaft fracture with proximal and distal
interlocking screws. Good anatomic alignment. There is a fibular
neck fracture with mild anterior displacement.
IMPRESSION: 1. Fluoroscopy for tibial nail fixation.
2. Mildly displaced fibular neck fracture.
3. No unexpected finding.

## 2017-03-08 IMAGING — XA DG C-ARM 61-120 MIN
1 series · 1 of 1 positions shown · non-contrast
Comparison: [DATE]

CLINICAL DATA: Elective surgery.

EXAM:
LEFT ANKLE - 2 VIEW; DG C-ARM 61-120 MIN

[Series 10: ortho standard · 1 of 1 slices shown]
[im 1/1]
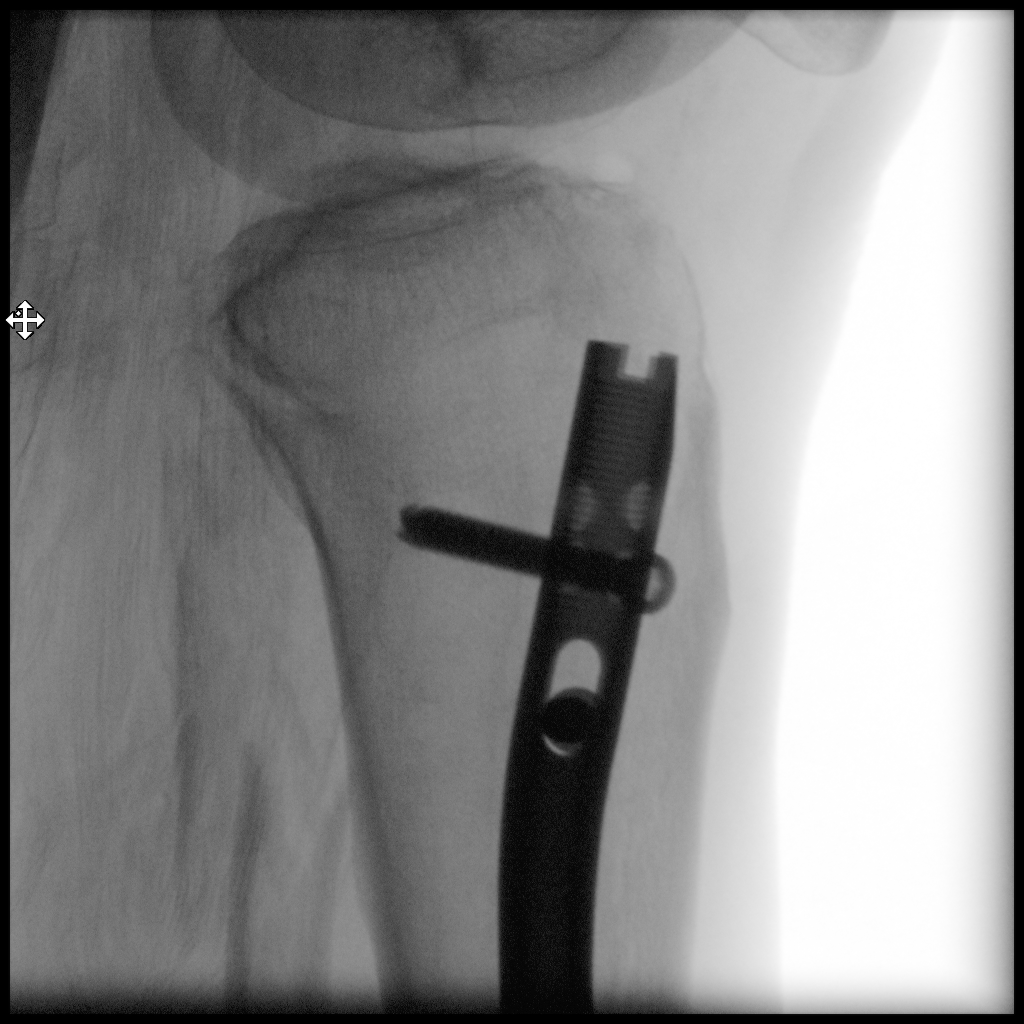

[1 of 1 positions shown; findings below may reference images not displayed]

FINDINGS: Tibial nail fixation of a shaft fracture with proximal and distal
interlocking screws. Good anatomic alignment. There is a fibular
neck fracture with mild anterior displacement.
IMPRESSION: 1. Fluoroscopy for tibial nail fixation.
2. Mildly displaced fibular neck fracture.
3. No unexpected finding.

## 2017-03-08 IMAGING — XA DG C-ARM 61-120 MIN
1 series · 1 of 1 positions shown · non-contrast
Comparison: [DATE]

CLINICAL DATA: Elective surgery.

EXAM:
LEFT ANKLE - 2 VIEW; DG C-ARM 61-120 MIN

[Series 14: ortho standard · 1 of 1 slices shown]
[im 1/1]
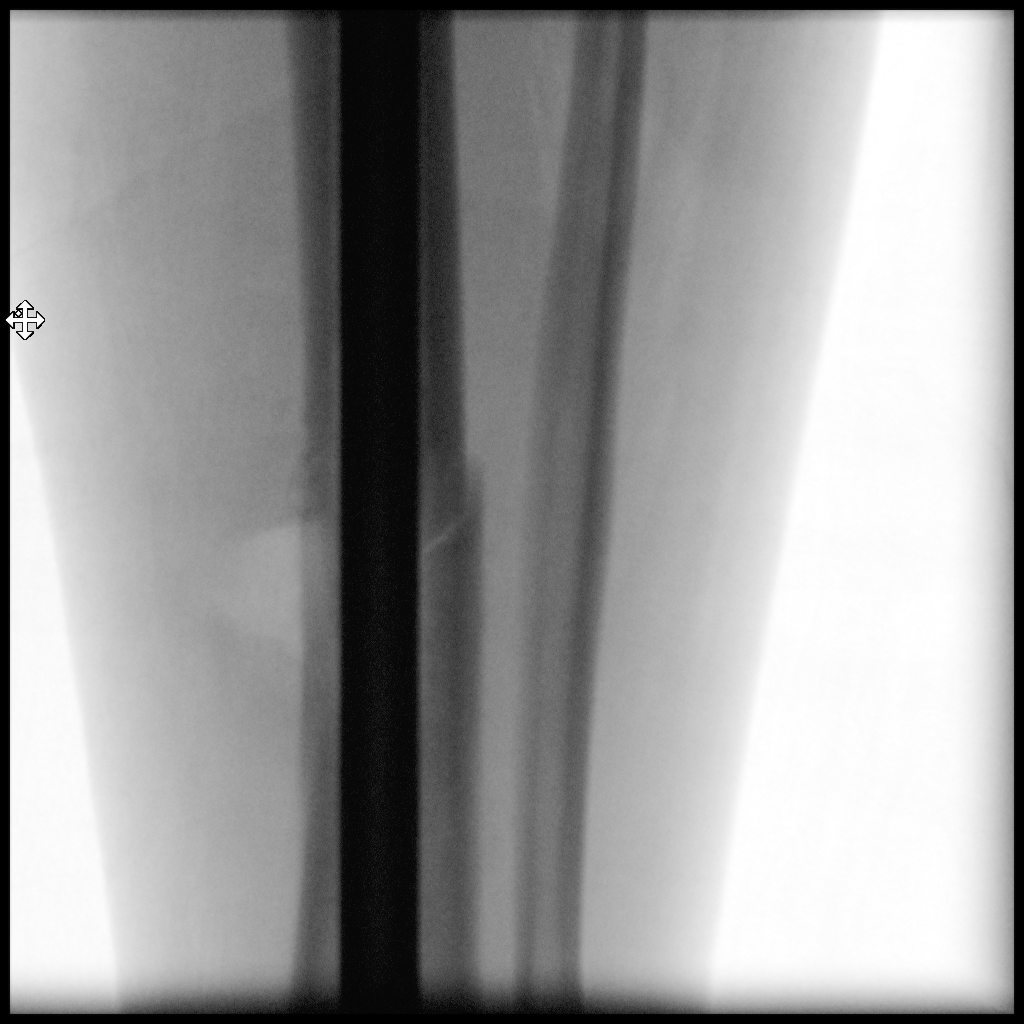

[1 of 1 positions shown; findings below may reference images not displayed]

FINDINGS: Tibial nail fixation of a shaft fracture with proximal and distal
interlocking screws. Good anatomic alignment. There is a fibular
neck fracture with mild anterior displacement.
IMPRESSION: 1. Fluoroscopy for tibial nail fixation.
2. Mildly displaced fibular neck fracture.
3. No unexpected finding.

## 2017-03-08 IMAGING — XA DG C-ARM 61-120 MIN
1 series · 1 of 1 positions shown · non-contrast
Comparison: [DATE]

CLINICAL DATA: Elective surgery.

EXAM:
LEFT ANKLE - 2 VIEW; DG C-ARM 61-120 MIN

[Series 8: ortho standard · 1 of 1 slices shown]
[im 1/1]
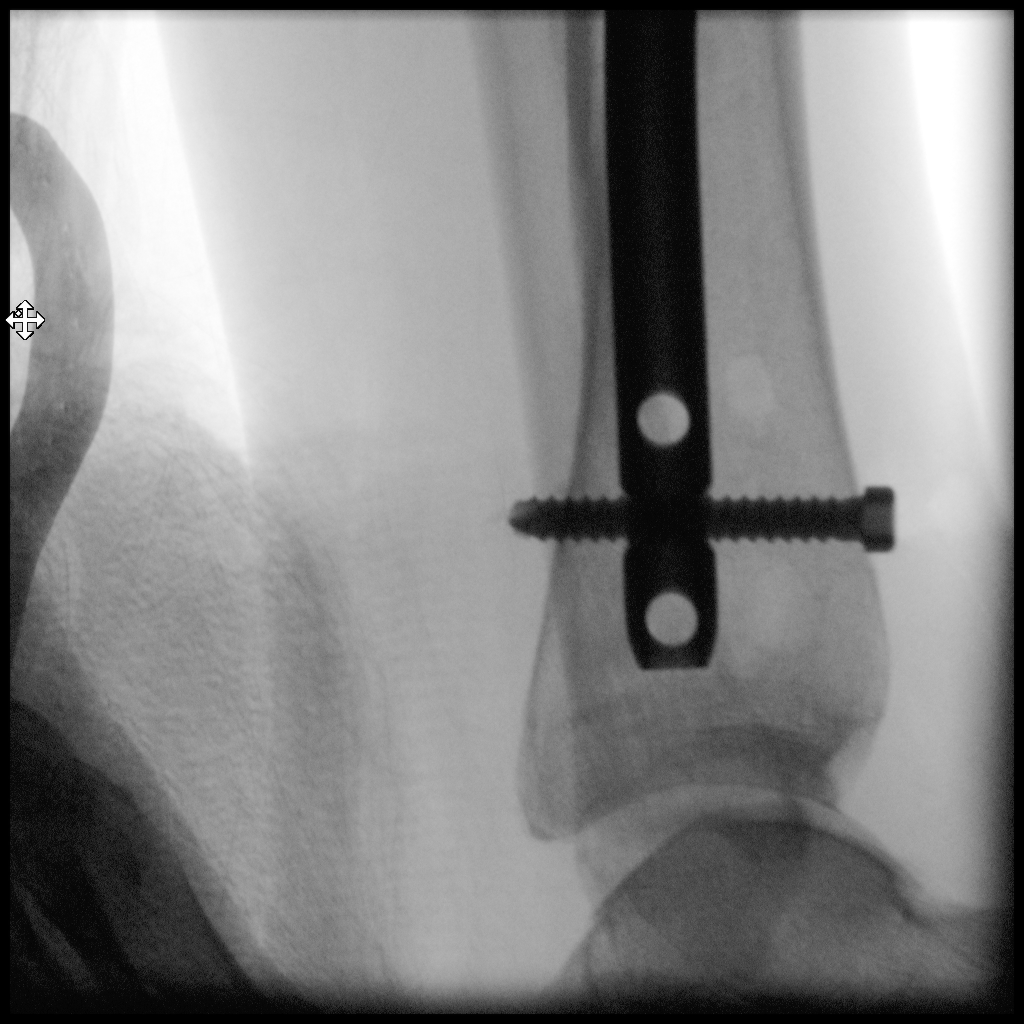

[1 of 1 positions shown; findings below may reference images not displayed]

FINDINGS: Tibial nail fixation of a shaft fracture with proximal and distal
interlocking screws. Good anatomic alignment. There is a fibular
neck fracture with mild anterior displacement.
IMPRESSION: 1. Fluoroscopy for tibial nail fixation.
2. Mildly displaced fibular neck fracture.
3. No unexpected finding.

## 2017-03-08 IMAGING — XA DG C-ARM 61-120 MIN
1 series · 1 of 1 positions shown · non-contrast
Comparison: [DATE]

CLINICAL DATA: Elective surgery.

EXAM:
LEFT ANKLE - 2 VIEW; DG C-ARM 61-120 MIN

[Series 11: ortho standard · 1 of 1 slices shown]
[im 1/1]
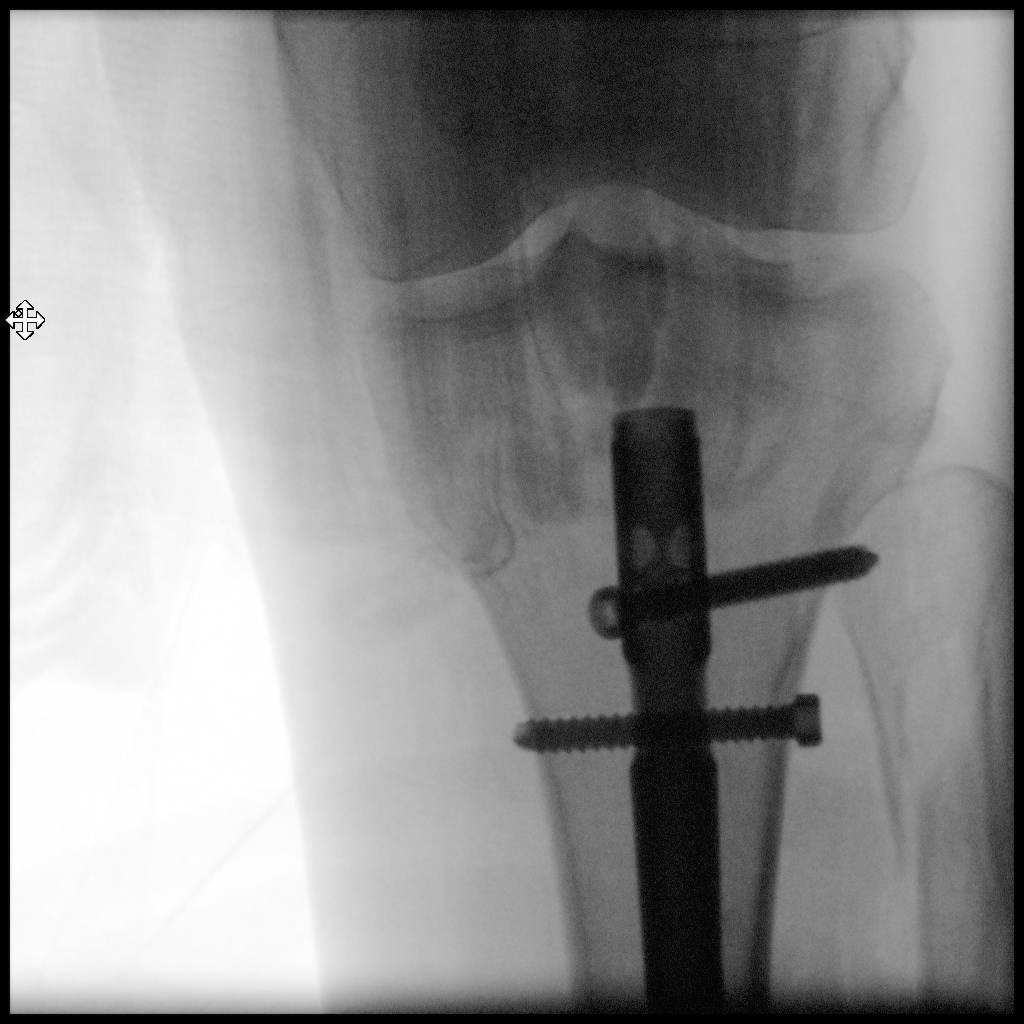

[1 of 1 positions shown; findings below may reference images not displayed]

FINDINGS: Tibial nail fixation of a shaft fracture with proximal and distal
interlocking screws. Good anatomic alignment. There is a fibular
neck fracture with mild anterior displacement.
IMPRESSION: 1. Fluoroscopy for tibial nail fixation.
2. Mildly displaced fibular neck fracture.
3. No unexpected finding.

## 2017-03-08 IMAGING — XA DG C-ARM 61-120 MIN
1 series · 1 of 1 positions shown · non-contrast
Comparison: [DATE]

CLINICAL DATA: Elective surgery.

EXAM:
LEFT ANKLE - 2 VIEW; DG C-ARM 61-120 MIN

[Series 13: ortho standard · 1 of 1 slices shown]
[im 1/1]
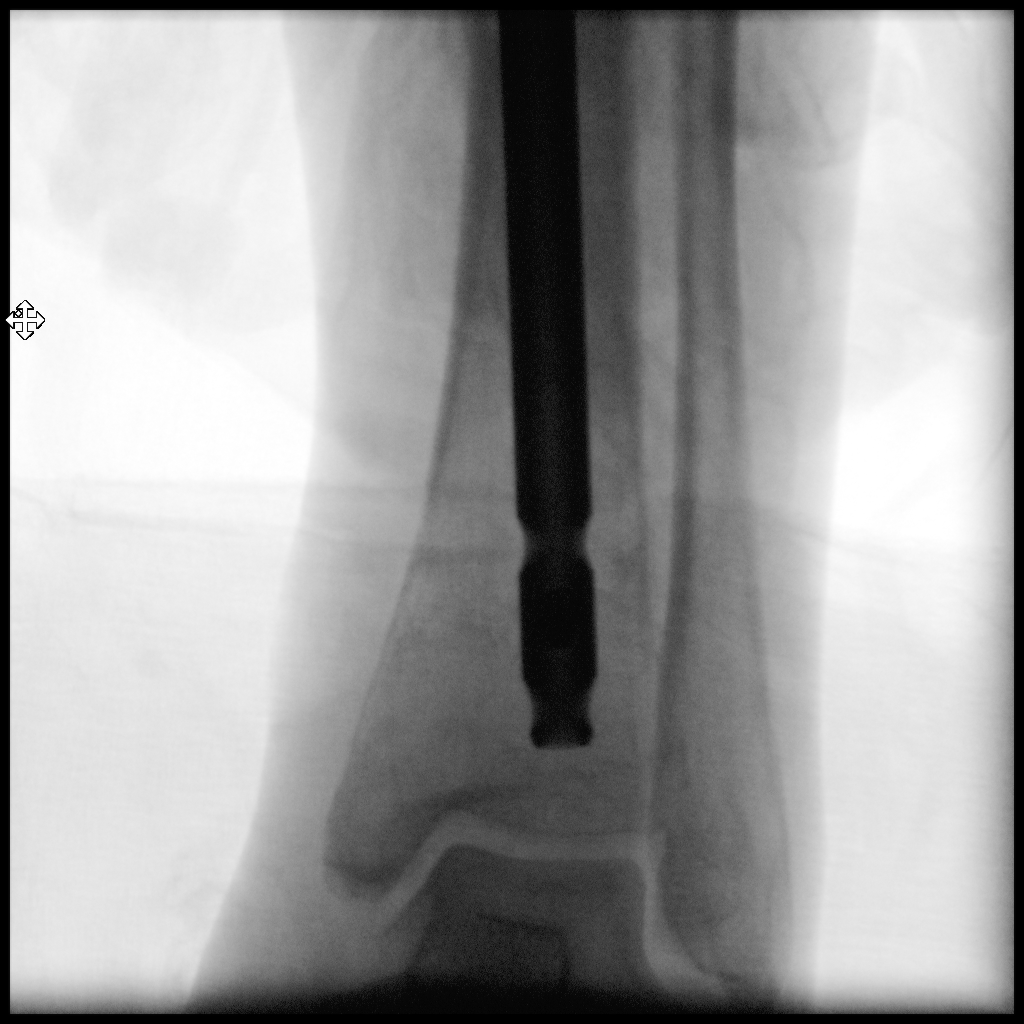

[1 of 1 positions shown; findings below may reference images not displayed]

FINDINGS: Tibial nail fixation of a shaft fracture with proximal and distal
interlocking screws. Good anatomic alignment. There is a fibular
neck fracture with mild anterior displacement.
IMPRESSION: 1. Fluoroscopy for tibial nail fixation.
2. Mildly displaced fibular neck fracture.
3. No unexpected finding.

## 2017-03-08 SURGERY — REMOVAL, HARDWARE
Anesthesia: General | Site: Leg Lower | Laterality: Left | Wound class: Dirty or Infected

## 2017-03-08 MED ORDER — PHENYLEPHRINE HCL 10 MG/ML IJ SOLN
INTRAMUSCULAR | Status: AC
  Filled 2017-03-08: qty 1

## 2017-03-08 MED ORDER — CLINDAMYCIN PHOSPHATE 900 MG/50ML IV SOLN
INTRAVENOUS | Status: AC
  Filled 2017-03-08: qty 50

## 2017-03-08 MED ORDER — ONDANSETRON HCL 4 MG/2ML IJ SOLN
INTRAMUSCULAR | Status: DC | PRN
  Administered 2017-03-08: 4 mg via INTRAVENOUS

## 2017-03-08 MED ORDER — LACTATED RINGERS IV SOLN
INTRAVENOUS | Status: DC
  Administered 2017-03-08 (×2): via INTRAVENOUS

## 2017-03-08 MED ORDER — FENTANYL CITRATE (PF) 250 MCG/5ML IJ SOLN
INTRAMUSCULAR | Status: AC
  Filled 2017-03-08: qty 5

## 2017-03-08 MED ORDER — LIDOCAINE HCL (CARDIAC) 20 MG/ML IV SOLN
INTRAVENOUS | Status: DC | PRN
  Administered 2017-03-08 (×2): 100 mg via INTRAVENOUS

## 2017-03-08 MED ORDER — POLYETHYLENE GLYCOL 3350 17 G PO PACK
17.0000 g | PACK | Freq: Every day | ORAL | Status: DC | PRN
  Administered 2017-03-10: 17 g via ORAL
  Filled 2017-03-08: qty 1

## 2017-03-08 MED ORDER — ACETAMINOPHEN 10 MG/ML IV SOLN
INTRAVENOUS | Status: AC
  Filled 2017-03-08: qty 100

## 2017-03-08 MED ORDER — DOCUSATE SODIUM 100 MG PO CAPS
100.0000 mg | ORAL_CAPSULE | Freq: Two times a day (BID) | ORAL | Status: DC
  Administered 2017-03-08 – 2017-03-10 (×4): 100 mg via ORAL
  Filled 2017-03-08 (×4): qty 1

## 2017-03-08 MED ORDER — OXYCODONE HCL 5 MG PO TABS: 5.0000 mg | ORAL_TABLET | Freq: Once | ORAL | Status: DC | PRN

## 2017-03-08 MED ORDER — FENTANYL CITRATE (PF) 100 MCG/2ML IJ SOLN
INTRAMUSCULAR | Status: DC | PRN
  Administered 2017-03-08 (×3): 25 ug via INTRAVENOUS
  Administered 2017-03-08 (×2): 50 ug via INTRAVENOUS
  Administered 2017-03-08 (×2): 25 ug via INTRAVENOUS
  Administered 2017-03-08: 150 ug via INTRAVENOUS
  Administered 2017-03-08 (×2): 25 ug via INTRAVENOUS
  Administered 2017-03-08: 50 ug via INTRAVENOUS
  Administered 2017-03-08: 25 ug via INTRAVENOUS

## 2017-03-08 MED ORDER — KETAMINE HCL 50 MG/ML IJ SOLN
INTRAMUSCULAR | Status: DC | PRN
  Administered 2017-03-08: 50 mg via INTRAVENOUS
  Administered 2017-03-08 (×2): 25 mg via INTRAMUSCULAR

## 2017-03-08 MED ORDER — METHOCARBAMOL 500 MG PO TABS
500.0000 mg | ORAL_TABLET | Freq: Four times a day (QID) | ORAL | Status: DC | PRN
  Administered 2017-03-08 – 2017-03-10 (×4): 500 mg via ORAL
  Filled 2017-03-08 (×4): qty 1

## 2017-03-08 MED ORDER — VANCOMYCIN HCL IN DEXTROSE 1-5 GM/200ML-% IV SOLN
1000.0000 mg | Freq: Two times a day (BID) | INTRAVENOUS | Status: DC
  Administered 2017-03-08 – 2017-03-10 (×4): 1000 mg via INTRAVENOUS
  Filled 2017-03-08 (×5): qty 200

## 2017-03-08 MED ORDER — FENTANYL CITRATE (PF) 100 MCG/2ML IJ SOLN
INTRAMUSCULAR | Status: AC
Start: 2017-03-08 — End: 2017-03-08
  Administered 2017-03-08: 25 ug via INTRAVENOUS
  Filled 2017-03-08: qty 2

## 2017-03-08 MED ORDER — LIDOCAINE HCL (PF) 2 % IJ SOLN
INTRAMUSCULAR | Status: AC
  Filled 2017-03-08: qty 10

## 2017-03-08 MED ORDER — ONDANSETRON HCL 4 MG/2ML IJ SOLN: 4.0000 mg | Freq: Four times a day (QID) | INTRAMUSCULAR | Status: DC | PRN

## 2017-03-08 MED ORDER — MIDAZOLAM HCL 2 MG/2ML IJ SOLN
INTRAMUSCULAR | Status: AC
  Filled 2017-03-08: qty 2

## 2017-03-08 MED ORDER — PROPOFOL 10 MG/ML IV BOLUS
INTRAVENOUS | Status: DC | PRN
  Administered 2017-03-08: 160 mg via INTRAVENOUS
  Administered 2017-03-08: 20 mg via INTRAVENOUS

## 2017-03-08 MED ORDER — PIPERACILLIN-TAZOBACTAM 3.375 G IVPB 30 MIN
3.3750 g | Freq: Three times a day (TID) | INTRAVENOUS | Status: DC
  Administered 2017-03-08 – 2017-03-09 (×3): 3.375 g via INTRAVENOUS
  Filled 2017-03-08 (×7): qty 50

## 2017-03-08 MED ORDER — OXYCODONE HCL 5 MG/5ML PO SOLN: 5.0000 mg | Freq: Once | ORAL | Status: DC | PRN

## 2017-03-08 MED ORDER — VANCOMYCIN HCL IN DEXTROSE 1-5 GM/200ML-% IV SOLN
1000.0000 mg | Freq: Two times a day (BID) | INTRAVENOUS | Status: DC
  Filled 2017-03-08: qty 200

## 2017-03-08 MED ORDER — ROCURONIUM BROMIDE 50 MG/5ML IV SOLN
INTRAVENOUS | Status: AC
  Filled 2017-03-08: qty 1

## 2017-03-08 MED ORDER — PIPERACILLIN-TAZOBACTAM 3.375 G IVPB 30 MIN
3.3750 g | Freq: Once | INTRAVENOUS | Status: AC
Start: 2017-03-08 — End: 2017-03-08
  Administered 2017-03-08: 3.375 g via INTRAVENOUS
  Filled 2017-03-08: qty 50

## 2017-03-08 MED ORDER — FAMOTIDINE 20 MG PO TABS
ORAL_TABLET | ORAL | Status: AC
  Administered 2017-03-08: 20 mg via ORAL
  Filled 2017-03-08: qty 1

## 2017-03-08 MED ORDER — PROPOFOL 10 MG/ML IV BOLUS
INTRAVENOUS | Status: AC
  Filled 2017-03-08: qty 20

## 2017-03-08 MED ORDER — MIDAZOLAM HCL 2 MG/2ML IJ SOLN
INTRAMUSCULAR | Status: DC | PRN
  Administered 2017-03-08: 2 mg via INTRAVENOUS

## 2017-03-08 MED ORDER — FENTANYL CITRATE (PF) 100 MCG/2ML IJ SOLN
INTRAMUSCULAR | Status: AC
  Administered 2017-03-08: 25 ug via INTRAVENOUS
  Filled 2017-03-08: qty 2

## 2017-03-08 MED ORDER — FLEET ENEMA 7-19 GM/118ML RE ENEM: 1.0000 | ENEMA | Freq: Once | RECTAL | Status: DC | PRN

## 2017-03-08 MED ORDER — HYDROMORPHONE HCL 1 MG/ML IJ SOLN: 0.5000 mg | INTRAMUSCULAR | Status: DC | PRN

## 2017-03-08 MED ORDER — VANCOMYCIN HCL IN DEXTROSE 1-5 GM/200ML-% IV SOLN
1000.0000 mg | Freq: Once | INTRAVENOUS | Status: AC
  Administered 2017-03-08: 1000 mg via INTRAVENOUS
  Filled 2017-03-08: qty 200

## 2017-03-08 MED ORDER — BISACODYL 10 MG RE SUPP
10.0000 mg | Freq: Every day | RECTAL | Status: DC | PRN
  Administered 2017-03-10: 10 mg via RECTAL
  Filled 2017-03-08: qty 1

## 2017-03-08 MED ORDER — ACETAMINOPHEN 500 MG PO TABS
1000.0000 mg | ORAL_TABLET | Freq: Three times a day (TID) | ORAL | Status: AC
  Administered 2017-03-08 – 2017-03-09 (×3): 1000 mg via ORAL
  Filled 2017-03-08 (×3): qty 2

## 2017-03-08 MED ORDER — FAMOTIDINE 20 MG PO TABS
20.0000 mg | ORAL_TABLET | Freq: Once | ORAL | Status: AC
  Administered 2017-03-08: 20 mg via ORAL

## 2017-03-08 MED ORDER — DEXAMETHASONE SODIUM PHOSPHATE 4 MG/ML IJ SOLN
INTRAMUSCULAR | Status: DC | PRN
  Administered 2017-03-08: 5 mg via INTRAVENOUS

## 2017-03-08 MED ORDER — METOCLOPRAMIDE HCL 5 MG/ML IJ SOLN: 5.0000 mg | Freq: Three times a day (TID) | INTRAMUSCULAR | Status: DC | PRN

## 2017-03-08 MED ORDER — DEXTROSE 5 % IV SOLN
500.0000 mg | Freq: Four times a day (QID) | INTRAVENOUS | Status: DC | PRN
  Filled 2017-03-08: qty 5

## 2017-03-08 MED ORDER — HYDROMORPHONE HCL 1 MG/ML IJ SOLN
INTRAMUSCULAR | Status: AC
Start: 2017-03-08 — End: ?
  Filled 2017-03-08: qty 1

## 2017-03-08 MED ORDER — CHLORHEXIDINE GLUCONATE CLOTH 2 % EX PADS: 6.0000 | MEDICATED_PAD | Freq: Every day | CUTANEOUS | Status: DC

## 2017-03-08 MED ORDER — SODIUM CHLORIDE 0.9 % IR SOLN
Status: DC | PRN
  Administered 2017-03-08: 1000 mL

## 2017-03-08 MED ORDER — OXYCODONE HCL 5 MG PO TABS
5.0000 mg | ORAL_TABLET | ORAL | Status: DC | PRN
  Administered 2017-03-08 – 2017-03-09 (×3): 5 mg via ORAL
  Filled 2017-03-08 (×3): qty 1

## 2017-03-08 MED ORDER — ENOXAPARIN SODIUM 40 MG/0.4ML ~~LOC~~ SOLN
40.0000 mg | SUBCUTANEOUS | Status: DC
  Administered 2017-03-09 – 2017-03-10 (×2): 40 mg via SUBCUTANEOUS
  Filled 2017-03-08 (×2): qty 0.4

## 2017-03-08 MED ORDER — ACETAMINOPHEN 10 MG/ML IV SOLN
INTRAVENOUS | Status: DC | PRN
  Administered 2017-03-08: 1000 mg via INTRAVENOUS

## 2017-03-08 MED ORDER — DIPHENHYDRAMINE HCL 12.5 MG/5ML PO ELIX
12.5000 mg | ORAL_SOLUTION | ORAL | Status: DC | PRN
  Administered 2017-03-10: 25 mg via ORAL
  Filled 2017-03-08: qty 10

## 2017-03-08 MED ORDER — METOCLOPRAMIDE HCL 10 MG PO TABS: 5.0000 mg | ORAL_TABLET | Freq: Three times a day (TID) | ORAL | Status: DC | PRN

## 2017-03-08 MED ORDER — VANCOMYCIN HCL 1000 MG IV SOLR
INTRAVENOUS | Status: AC
  Filled 2017-03-08: qty 1000

## 2017-03-08 MED ORDER — HYDROMORPHONE HCL 1 MG/ML IJ SOLN
INTRAMUSCULAR | Status: AC
  Filled 2017-03-08: qty 1

## 2017-03-08 MED ORDER — PROMETHAZINE HCL 25 MG/ML IJ SOLN: 6.2500 mg | INTRAMUSCULAR | Status: DC | PRN

## 2017-03-08 MED ORDER — ONDANSETRON HCL 4 MG PO TABS: 4.0000 mg | ORAL_TABLET | Freq: Four times a day (QID) | ORAL | Status: DC | PRN

## 2017-03-08 MED ORDER — MEPERIDINE HCL 50 MG/ML IJ SOLN: 6.2500 mg | INTRAMUSCULAR | Status: DC | PRN

## 2017-03-08 MED ORDER — DEXAMETHASONE SODIUM PHOSPHATE 10 MG/ML IJ SOLN
INTRAMUSCULAR | Status: AC
  Filled 2017-03-08: qty 1

## 2017-03-08 MED ORDER — HYDROMORPHONE HCL 1 MG/ML IJ SOLN
INTRAMUSCULAR | Status: DC | PRN
  Administered 2017-03-08: 1 mg via INTRAVENOUS

## 2017-03-08 MED ORDER — NEOMYCIN-POLYMYXIN B GU 40-200000 IR SOLN
Status: AC
  Filled 2017-03-08: qty 20

## 2017-03-08 MED ORDER — KETAMINE HCL 50 MG/ML IJ SOLN
INTRAMUSCULAR | Status: AC
  Filled 2017-03-08: qty 10

## 2017-03-08 MED ORDER — OXYCODONE HCL 5 MG PO TABS
10.0000 mg | ORAL_TABLET | ORAL | Status: DC | PRN
  Administered 2017-03-08 – 2017-03-10 (×11): 10 mg via ORAL
  Filled 2017-03-08 (×11): qty 2

## 2017-03-08 MED ORDER — ROCURONIUM BROMIDE 100 MG/10ML IV SOLN
INTRAVENOUS | Status: DC | PRN
  Administered 2017-03-08: 20 mg via INTRAVENOUS
  Administered 2017-03-08: 50 mg via INTRAVENOUS

## 2017-03-08 MED ORDER — HYDROMORPHONE HCL 1 MG/ML IJ SOLN
0.5000 mg | INTRAMUSCULAR | Status: DC | PRN
  Administered 2017-03-08: 0.5 mg via INTRAVENOUS

## 2017-03-08 MED ORDER — FENTANYL CITRATE (PF) 100 MCG/2ML IJ SOLN
25.0000 ug | INTRAMUSCULAR | Status: AC | PRN
  Administered 2017-03-08 (×6): 25 ug via INTRAVENOUS

## 2017-03-08 SURGICAL SUPPLY — 49 items
BIT DRILL AO GAMMA 4.2X130 (BIT) ×1 IMPLANT
BIT DRILL AO GAMMA 4.2X180 (BIT) ×1 IMPLANT
BIT DRILL AO GAMMA 4.2X340 (BIT) ×1 IMPLANT
BLADE CLIPPER SURG (BLADE) ×1 IMPLANT
CANISTER SUCT 1200ML W/VALVE (MISCELLANEOUS) ×2 IMPLANT
CHLORAPREP W/TINT 26ML (MISCELLANEOUS) ×1 IMPLANT
CUFF TOURN 24 STER (MISCELLANEOUS) ×1 IMPLANT
CUFF TOURN 30 STER DUAL PORT (MISCELLANEOUS) IMPLANT
DRAPE C-ARM XRAY 36X54 (DRAPES) ×2 IMPLANT
DRAPE C-ARMOR (DRAPES) ×2 IMPLANT
DRSG VAC ATS MED SENSATRAC (GAUZE/BANDAGES/DRESSINGS) ×1 IMPLANT
DURAPREP 26ML APPLICATOR (WOUND CARE) ×3 IMPLANT
ELECT CAUTERY BLADE 6.4 (BLADE) ×2 IMPLANT
ELECT REM PT RETURN 9FT ADLT (ELECTROSURGICAL) ×2
ELECTRODE REM PT RTRN 9FT ADLT (ELECTROSURGICAL) ×1 IMPLANT
GAUZE PETRO XEROFOAM 1X8 (MISCELLANEOUS) ×2 IMPLANT
GAUZE SPONGE 4X4 12PLY STRL (GAUZE/BANDAGES/DRESSINGS) ×2 IMPLANT
GLOVE BIOGEL PI IND STRL 8 (GLOVE) ×1 IMPLANT
GLOVE BIOGEL PI INDICATOR 8 (GLOVE) ×1
GLOVE SURG SYN 7.5  E (GLOVE) ×2
GLOVE SURG SYN 7.5 E (GLOVE) ×2 IMPLANT
GLOVE SURG SYN 7.5 PF PI (GLOVE) ×2 IMPLANT
GOWN STRL REUS W/ TWL LRG LVL3 (GOWN DISPOSABLE) ×1 IMPLANT
GOWN STRL REUS W/ TWL XL LVL3 (GOWN DISPOSABLE) ×1 IMPLANT
GOWN STRL REUS W/TWL LRG LVL3 (GOWN DISPOSABLE) ×2
GOWN STRL REUS W/TWL XL LVL3 (GOWN DISPOSABLE) ×2
GUIDEROD T2 3X1000 (ROD) ×1 IMPLANT
HANDPIECE VERSAJET DEBRIDEMENT (MISCELLANEOUS) ×1 IMPLANT
K-WIRE FIXATION 3X285 COATED (WIRE) ×4
KIT RM TURNOVER STRD PROC AR (KITS) ×2 IMPLANT
KWIRE FIXATION 3X285 COATED (WIRE) IMPLANT
NAIL ELAS INSERT SLV SPI 8-11 (MISCELLANEOUS) ×1 IMPLANT
NAIL TIBIAL STD 10X315MM (Nail) ×1 IMPLANT
NDL FILTER BLUNT 18X1 1/2 (NEEDLE) ×1 IMPLANT
NEEDLE FILTER BLUNT 18X 1/2SAF (NEEDLE)
NEEDLE FILTER BLUNT 18X1 1/2 (NEEDLE) IMPLANT
NS IRRIG 1000ML POUR BTL (IV SOLUTION) ×2 IMPLANT
PACK TOTAL KNEE (MISCELLANEOUS) ×2 IMPLANT
REAMER SHAFT BIXCUT (INSTRUMENTS) ×1 IMPLANT
SCREW LOCKING T2 F/T  5MMX40MM (Screw) ×2 IMPLANT
SCREW LOCKING T2 F/T  5MMX45MM (Screw) ×1 IMPLANT
SCREW LOCKING T2 F/T 5MMX40MM (Screw) IMPLANT
SCREW LOCKING T2 F/T 5MMX45MM (Screw) IMPLANT
SPLINT CAST 1 STEP 5X30 WHT (MISCELLANEOUS) ×2 IMPLANT
STAPLER SKIN PROX 35W (STAPLE) ×2 IMPLANT
SUT VIC AB 0 CT1 36 (SUTURE) ×2 IMPLANT
SUT VIC AB 2-0 CT1 (SUTURE) ×2 IMPLANT
SYR 5ML LL (SYRINGE) ×1 IMPLANT
WND VAC CANISTER 500ML (MISCELLANEOUS) ×1 IMPLANT

## 2017-03-08 NOTE — Anesthesia Post-op Follow-up Note (Signed)
Anesthesia QCDR form completed.        

## 2017-03-08 NOTE — Progress Notes (Signed)
Overall, felling well after surgery. Pain adequately controlled.   LLE Exam: - No significant EHL/ankle DF function, able to flex toes and PF ankle - some numbness in sp/dp distributions over foot, but can localize sensation. SILT in t/s/s distr over foot - Foot wwp - VAC functioning well.  - Splint in place  Plan: - NWB LLE - In splint that can be removed by unwrapping bias wrap. VAC change can then be performed as needed with re-application of splint and wrap.  - Maintain VAC.  - Infectious Diseases team consulted for assistance with Abx management. Will likely need PICC tomorrow.  - Continue [employer] IV Abx until cultures result (Gram stain already growing gram pos cocci) - Home health will be needed for PICC, IV Abx, and wound VAC changes 3x/week.  - Will discuss case with Plastic Surgery team for assistance with open wound - Lovenox for DVT ppx while inpatient. Can be discharged on ASA 325mg  bid x 4 weeks.

## 2017-03-08 NOTE — H&P (Signed)
Paper H&P to be scanned into permanent record (updated H&P will have heart & lung assessment). H&P reviewed. No significant changes noted.

## 2017-03-08 NOTE — Anesthesia Postprocedure Evaluation (Signed)
Anesthesia Post Note  Patient: [name] Carrillo  Procedure(s) Performed: HARDWARE REMOVAL-LEFT LEG AND ANKLE (Left Leg Lower) APPLICATION OF WOUND VAC (Left Leg Lower)  Patient location during evaluation: PACU Anesthesia Type: General Level of consciousness: awake Pain management: pain level controlled Vital Signs Assessment: post-procedure vital signs reviewed and stable Respiratory status: spontaneous breathing Cardiovascular status: stable Anesthetic complications: no     Last Vitals:  Vitals:   03/08/17 0619  BP: 126/81  Pulse: 100  Resp: 16  Temp: 36.7 C  SpO2: 98%    Last Pain:  Vitals:   03/08/17 1213  TempSrc:   PainSc: (P) 10-Worst pain ever                 VAN STAVEREN,Mike Hamre

## 2017-03-08 NOTE — Anesthesia Procedure Notes (Signed)
Procedure Name: Intubation Date/Time: 03/08/2017 7:31 AM Performed by: Bernardo Heater, CRNA Pre-anesthesia Checklist: Patient identified, Emergency Drugs available, Suction available and Patient being monitored Patient Re-evaluated:Patient Re-evaluated prior to induction Oxygen Delivery Method: Circle system utilized Preoxygenation: Pre-oxygenation with 100% oxygen Induction Type: IV induction Ventilation: Mask ventilation without difficulty Laryngoscope Size: Mac and 3 Grade View: Grade I Tube size: 7.0 mm Number of attempts: 1 Placement Confirmation: ETT inserted through vocal cords under direct vision,  positive ETCO2 and breath sounds checked- equal and bilateral Secured at: 22 cm Tube secured with: Tape Dental Injury: Teeth and Oropharynx as per pre-operative assessment

## 2017-03-08 NOTE — Anesthesia Preprocedure Evaluation (Signed)
Anesthesia Evaluation  Patient identified by MRN, date of birth, ID band Patient awake    Reviewed: Allergy & Precautions, NPO status , Patient's Chart, lab work & pertinent test results  History of Anesthesia Complications Negative for: history of anesthetic complications  Airway Mallampati: III  TM Distance: >3 FB Neck ROM: Full    Dental  (+) Poor Dentition,    Pulmonary neg sleep apnea, neg COPD, former smoker,    breath sounds clear to auscultation- rhonchi (-) wheezing      Cardiovascular Exercise Tolerance: Good (-) hypertension(-) CAD, (-) Past MI and (-) Cardiac Stents  Rhythm:Regular Rate:Normal - Systolic murmurs and - Diastolic murmurs    Neuro/Psych PSYCHIATRIC DISORDERS Anxiety Depression negative neurological ROS     GI/Hepatic negative GI ROS, Neg liver ROS,   Endo/Other  negative endocrine ROSneg diabetes  Renal/GU negative Renal ROS     Musculoskeletal negative musculoskeletal ROS (+)   Abdominal (+) - obese,   Peds  Hematology   Anesthesia Other Findings Past Medical History: No date: Anxiety No date: Depression   Reproductive/Obstetrics                             Anesthesia Physical Anesthesia Plan  ASA: II  Anesthesia Plan: General   Post-op Pain Management:    Induction: Intravenous  PONV Risk Score and Plan: 2 and Ondansetron and Dexamethasone  Airway Management Planned: Oral ETT  Additional Equipment:   Intra-op Plan:   Post-operative Plan: Extubation in OR  Informed Consent: I have reviewed the patients History and Physical, chart, labs and discussed the procedure including the risks, benefits and alternatives for the proposed anesthesia with the patient or authorized representative who has indicated his/her understanding and acceptance.   Dental advisory given  Plan Discussed with: CRNA and Anesthesiologist  Anesthesia Plan Comments:          Anesthesia Quick Evaluation

## 2017-03-08 NOTE — Transfer of Care (Signed)
Immediate Anesthesia Transfer of Care Note  Patient: [name] Carrillo  Procedure(s) Performed: HARDWARE REMOVAL-LEFT LEG AND ANKLE (Left Leg Lower) APPLICATION OF WOUND VAC (Left Leg Lower)  Patient Location: PACU  Anesthesia Type:General  Level of Consciousness: awake and patient cooperative  Airway & Oxygen Therapy: Patient Spontanous Breathing and Patient connected to nasal cannula oxygen  Post-op Assessment: Report given to RN and Post -op Vital signs reviewed and stable  Post vital signs: Reviewed and stable  Last Vitals:  Vitals:   03/08/17 0619  BP: 126/81  Pulse: 100  Resp: 16  Temp: 36.7 C  SpO2: 98%    Last Pain:  Vitals:   03/08/17 0619  TempSrc: Oral  PainSc: 9          Complications: No apparent anesthesia complications

## 2017-03-08 NOTE — Op Note (Addendum)
DATE OF SURGERY: 03/08/2017  PREOPERATIVE DIAGNOSIS:  1. Left tibia fracture 2. Left proximal fibula fracture 3. Infection of L tibia  POSTOPERATIVE DIAGNOSIS:  1. Left tibia fracture 2. Left proximal fibula fracture 3. Infection of L tibia  PROCEDURE: 1. Exchange intramedullary nailing of Left tibia  2. Irrigation and debridement of L tibia 3. Application of wound VAC device  SURGEON: Rosealee AlbeeSunny H. Shaneeka Scarboro, MD  ASSISTANTS: none  EBL: 100cc  COMPONENTS:  [employer] T2 Nail: 10 x 315mm; 2 proximal interlocking screws; 1 distal interolocking screws.   INDICATIONS: [name] Carrillo is a 51 y.o. female who sustained a tibial fracture after a fall and underwent L tibia IM nailing on 02/12/17. She was seen by a PA ~1 week ago and given a course of Bactrim for a suspected skin blister. She presented to the office on 03/07/17 with a sinus tract medial to the incision, but over the fracture site with drainage from this site. She also had infected appearing medial site over the distal interlocking screws. Clinical picture was concerning for deep infection. Risks and benefits of exchange intramedullary nailing of tibia, I&D, and VAC application were explained to the patient. Risks include but are not limited to bleeding, infection, injury to tissues, nerves, vessels, periprosthetic infection, dislocation, limb length discrepancy and risks of anesthesia. The patient understands these risks, has completed an informed consent, and wishes to proceed. Of note, she had some peroneal nerve dysfunction preoperatively with decreased EHL and ankle DF function and decreased sensation in peroneal distribution on foot.    PROCEDURE:  The patient was brought into the operating room. After administering anesthesia, the patient was placed in the supine position on a radiolucent table. Anesthesia was administered. Preoperative IV antibiotics were not administered in order to obtain a better culture sample. She was on  preoperative Bactrim as an outpatient. The leg was prepped with DuraPrep solution before being draped sterilely in the standard fashion. A timeout was performed to verify the appropriate surgical site, patient, and procedure.   First, the ~6cm incision over the fracture site was opened sharply with a knife. There was some purulent material deep to this incision, especially medially under the draining sinus. This lied directly over the fracture site. The soft tissue, skin, and bone were sharply debrided of non-viable tissue until bleeding edges were obtained with a combination of a knife, curette, and rongeur. Multiple culture samples were obtained. The wound was thoroughly irrigated with Versajet device. There were no signs of callus formation.  Next, I opened the medial wound. Tissue under this wound did not appear healthy. The interlocking screws were loose and removed. Devitalized tissue was removed with a combination of knife and curette. Screw holes were curetted out. Multiple culture samples were obtained. The wound was thoroughly irrigated with Versajet device.   The prior suprapatellar incision was then made proximal to the superior pole of the patella.  The quadriceps tendon was identified and a longitudinal incision was made in the midportion of the quadriceps tendon.  This allowed excellent access to the patellofemoral joint.  A protective sleeve was inserted and extraction device was threaded onto the nail and backslapped out without difficulty. A ball-tipped guidewire was placed into the medullary canal, across the fracture site, and down to the physeal scar at the ankle.  Next, we began sequential reaming starting with a 10mm reamer and advancing to a 11.5 mm reamer. Intramedullary reamings were collected and sent for culture as well. IV Vancomycin and Zosyn was started  at this time. We ensured that the fracture was reduced during final reaming. This gave excellent cortical chatter. The nail was  advanced to an appropriate position and reduction was maintained. Two 5.140mm proximal interlocking screws were placed in the static lateral and oblique medial positions in a standard fashion. Next, one anterior to posterior distal interlocking 4.0 millimeter screw was placed using the perfect circle technique. The lateral incision was the one used to place the screw as the medial incision was made with the fracture not reduced. We then confirmed appropriate fracture reduction fluoroscopically in both the AP and lateral planes.  The wounds were irrigated thoroughly with sterile saline solution.  The quadriceps tendon was closed with 0 Vicryl.  The quadriceps incision was closed with 2-0 Vicryl interrupted sutures in the dermis and 3-0 nylon for skin. The stab incisions for interlocking bolts were closed with 3-0 Nylon. The medial incision was closed with 2-0 nylon in a combination of vertical mattress and simple interrupted suture. The incision over the fracture site could only be partially closed at the proximal and distal extents of the incision with 3-0 nylon in a vertical mattress incision. Afterwards, there remained an ~2.5cm x 2.0cm, 1.0cm deep defect left with deep bone exposed. A VAC device was placed over this wound. Sterile occlusive dressings were applied to all wounds.  A short leg removable splint was applied so that VAC changes could be performed.  The patient was then transferred to the recovery room in satisfactory condition after tolerating the procedure well.  POSTOPERATIVE PLAN: The patient will be NWB on the operative extremity.  Can plan advance weightbearing in 2 weeks at time of outpatient follow-up Lovenox 40mg /day while inpatient start on POD#1.  Can transition to ASA 325 mg twice daily after discharge.  Vanc/Zosyn until cultures result. PT/OT on POD#1. Infectious Disease team consulted. Continue to monitor neurological exam regarding peroneal nerve given preoperative findings.

## 2017-03-08 NOTE — Progress Notes (Signed)
Pt arrived to unit from PACU. Oriented to unit. Call bell within reach. Skin assessment completed with Shawna OrleansMelanie, RN. VSS. Pt on room air. Will continue to monitor.

## 2017-03-08 NOTE — Progress Notes (Signed)
ANTIBIOTIC CONSULT NOTE - INITIAL  Pharmacy Consult for vancomycin Indication: osteomyelitis  No Known Allergies  Patient Measurements: Height: 5\' 5"  (165.1 cm) Weight: 164 lb (74.4 kg) IBW/kg (Calculated) : 57 Adjusted Body Weight:   Vital Signs: Temp: 98.2 F (36.8 C) (12/05 1354) Temp Source: Oral (12/05 1354) BP: 127/63 (12/05 1354) Pulse Rate: 77 (12/05 1354) Intake/Output from previous day: No intake/output data recorded. Intake/Output from this shift: Total I/O In: 1700 [I.V.:1700] Out: 100 [Blood:100]  Labs: Recent Labs    03/08/17 1402  WBC 11.6*  HGB 11.9*  PLT 353  CREATININE 0.61   Estimated Creatinine Clearance: 83.1 mL/min (by C-G formula based on SCr of 0.61 mg/dL). No results for input(s): VANCOTROUGH, VANCOPEAK, VANCORANDOM, GENTTROUGH, GENTPEAK, GENTRANDOM, TOBRATROUGH, TOBRAPEAK, TOBRARND, AMIKACINPEAK, AMIKACINTROU, AMIKACIN in the last 72 hours.   Microbiology: No results found for this or any previous visit (from the past 720 hour(s)).  Medical History: Past Medical History:  Diagnosis Date  . Anxiety   . Depression     Medications:  Infusions:  . methocarbamol (ROBAXIN)  IV    . piperacillin-tazobactam    . vancomycin     Assessment: 51 yof with tibia fracture and drainage now sp IM nail. After procedure had increased drainage and pain, started oral Bactrim, however wound worsened and hardware had to be removed. ID diagnoses OM tibia, plan for 4 to 6 weeks of abx. Pharmacy consulted to dose vancomycin.  Goal of Therapy:  Vancomycin trough level 15-20 mcg/ml  Plan:  Vancomycin 1 gm IV x 1 today preop at 09:34. Will continue vancomycin 1 gm IV Q12H starting this evening at 20:00 (modified stacked dosing 08:00 and 20:00 for ease of home administration). Pharmacy will continue to follow and adjust as needed to maintain trough 15 to 20 mcg/mL.   Vd 44.6 L, Ke 0.073 hr-1, T1/2 9.5 hr  Carola FrostNathan A Garrin Kirwan, Pharm.D., BCPS Clinical  Pharmacist 03/08/2017,3:03 PM

## 2017-03-08 NOTE — Consult Note (Signed)
[facility] Infectious Disease     Reason for Consult: Ostoemyelitis    Referring Physician: Leim Fabry Date of Admission:  03/08/2017   Active Problems:   Tibia fracture   HPI: [name] Carrillo is a 51 y.o. female who was admitted with draining wound at site of recent tib fracture. She initially had a closed tib fib fx 11/11 and underwent IM nail. She was seen 11/20 and was doing well and had staple removal. Then started to have some increased drainage and pain 11/26 and started bactrim DS.  However wound progressed and had surgery today with removal of the hardware and reaming of to canal. Cultures have been sent. There is a defect over the tib now with exposed bone.    Past Medical History:  Diagnosis Date  . Anxiety   . Depression    Past Surgical History:  Procedure Laterality Date  . right leg surgery Right 2014   .IM nailing right leg. screws and rod inserted.  . TIBIA IM NAIL INSERTION Left 02/12/2017   Procedure: INTRAMEDULLARY (IM) NAIL TIBIAL;  Surgeon: Leim Fabry, MD;  Location: [facility] ORS;  Service: Orthopedics;  Laterality: Left;  . TUBAL LIGATION  11/2002   Social History   Tobacco Use  . Smoking status: Former Smoker    Packs/day: 0.50    Types: Cigarettes    Last attempt to quit: 05/04/2016    Years since quitting: 0.8  . Smokeless tobacco: Never Used  Substance Use Topics  . Alcohol use: Yes  . Drug use: No   History reviewed. No pertinent family history.  Allergies: No Known Allergies  Current antibiotics: Antibiotics Given (last 72 hours)    Date/Time Action Medication Dose   03/08/17 0920 Given   piperacillin-tazobactam (ZOSYN) IVPB 3.375 g 3.375 g   03/08/17 0934 Given   vancomycin (VANCOCIN) IVPB 1000 mg/200 mL premix 1,000 mg      MEDICATIONS: . acetaminophen  1,000 mg Oral Q8H  . docusate sodium  100 mg Oral BID  . [START ON 03/09/2017] enoxaparin (LOVENOX) injection  40 mg Subcutaneous Q24H  . HYDROmorphone        Review of Systems  - 11 systems reviewed and negative per HPI   OBJECTIVE: Temp:  [98.1 F (36.7 C)-99.1 F (37.3 C)] 98.2 F (36.8 C) (12/05 1354) Pulse Rate:  [75-100] 77 (12/05 1354) Resp:  [12-26] 16 (12/05 1354) BP: (126-141)/(46-87) 127/63 (12/05 1354) SpO2:  [92 %-100 %] 95 % (12/05 1354) Weight:  [74.4 kg (164 lb)] 74.4 kg (164 lb) (12/05 1324) Physical Exam  Constitutional:  oriented to person, place, and time. appears well-developed and well-nourished. No distress.  HENT: [place]/AT, PERRLA, no scleral icterus Mouth/Throat: Oropharynx is clear and moist. No oropharyngeal exudate.  Cardiovascular: Normal rate, regular rhythm and normal heart sounds. Exam reveals no gallop and no friction rub.  No murmur heard.  Pulmonary/Chest: Effort normal and breath sounds normal. No respiratory distress.  has no wheezes.  Neck = supple, no nuchal rigidity Abdominal: Soft. Bowel sounds are normal.  exhibits no distension. There is no tenderness.  Lymphadenopathy: no cervical adenopathy. No axillary adenopathy Neurological: alert and oriented to person, place, and time.  Skin: L leg wrapped post op   Psychiatric: a normal mood and affect.  behavior is normal.    LABS: No results found for this or any previous visit (from the past 48 hour(s)). No components found for: ESR, C REACTIVE PROTEIN MICRO: No results found for this or any previous visit (  from the past 720 hour(s)).  IMAGING: Dg Tibia/fibula Left  Result Date: 02/12/2017 CLINICAL DATA:  Left tibial IM nail EXAM: LEFT TIBIA AND FIBULA - 2 VIEW COMPARISON:  02/12/2017 FINDINGS: Multiple intraoperative views demonstrate intramedullary nail fixation of the midshaft LEFT femur fracture. Two proximal interlocking cortical screws. 2 distal cortical interlocking screws. Proximal fibular fracture noted. IMPRESSION: Intramedullary nail fixation of midshaft femur fracture. Electronically Signed   By: Suzy Bouchard M.D.   On: 02/12/2017 12:57   Dg  Tibia/fibula Left  Result Date: 02/12/2017 CLINICAL DATA:  Post fall, now with left leg pain EXAM: LEFT TIBIA AND FIBULA - 2 VIEW COMPARISON:  None. FINDINGS: There are comminuted obliquely oriented displaced fractures involving the midshaft of the tibia and proximal metaphysis of the fibula with associated foreshortening and posterolateral deviation of the distal fracture fragments. The fibular fracture likely extends to involve the proximal tib-fib joint. Limited visualization of the adjacent knee and ankle is otherwise normal given obliquity. Expected adjacent soft tissue swelling.  No radiopaque foreign body. IMPRESSION: Comminuted, displaced fractures of the midshaft of the tibia and proximal metaphysis of the fibula. Electronically Signed   By: Sandi Mariscal M.D.   On: 02/12/2017 08:36   Dg Ankle 2 Views Left  Result Date: 03/08/2017 CLINICAL DATA:  Elective surgery. EXAM: LEFT ANKLE - 2 VIEW; DG C-ARM 61-120 MIN COMPARISON:  02/12/2017 FINDINGS: Tibial nail fixation of a shaft fracture with proximal and distal interlocking screws. Good anatomic alignment. There is a fibular neck fracture with mild anterior displacement. IMPRESSION: 1. Fluoroscopy for tibial nail fixation. 2. Mildly displaced fibular neck fracture. 3. No unexpected finding. Electronically Signed   By: Monte Fantasia M.D.   On: 03/08/2017 11:16   Dg Tibia/fibula Left Port  Result Date: 03/08/2017 CLINICAL DATA:  Status post tibial and fibular fractures EXAM: PORTABLE LEFT TIBIA AND FIBULA - 2 VIEW COMPARISON:  02/12/17 FINDINGS: Medullary rod is again seen and stable. The distal fixation screws appear to have withdrawn somewhat in the interval when compared with the prior exam. The proximal fixation screws appear within normal limits. No significant callus formation is noted. No other focal abnormality is seen. The midshaft tibial and proximal fibular fractures are again noted. Some air is noted in the medial soft tissues of the mid  lower leg. IMPRESSION: Some slight withdrawal of fixation screws distally in the tibia. The known tibial and fibular fractures are again seen and relatively stable. Some air is noted within the medial soft tissues. Electronically Signed   By: Inez Catalina M.D.   On: 03/08/2017 09:05   Dg C-arm 61-120 Min  Result Date: 03/08/2017 CLINICAL DATA:  Elective surgery. EXAM: LEFT ANKLE - 2 VIEW; DG C-ARM 61-120 MIN COMPARISON:  02/12/2017 FINDINGS: Tibial nail fixation of a shaft fracture with proximal and distal interlocking screws. Good anatomic alignment. There is a fibular neck fracture with mild anterior displacement. IMPRESSION: 1. Fluoroscopy for tibial nail fixation. 2. Mildly displaced fibular neck fracture. 3. No unexpected finding. Electronically Signed   By: Monte Fantasia M.D.   On: 03/08/2017 11:16   Dg C-arm 61-120 Min-no Report  Result Date: 02/12/2017 Fluoroscopy was utilized by the requesting physician.  No radiographic interpretation.    Assessment:   [name] Carrillo is a 51 y.o. female with L  leg wound over tibia and tibial osteomyelitis following IM nail placement 11/11 for tib fib fracture.  She had done well but then developed some redness at site and started bactrim 11/26  however infection worsened. She underwent 12/5 removal of hardware and has wound vac over a tibial skin defect with exposed bone. She has labs and cultures pending.  I discussed with her that the treatment will involve likely 4-6 weeks IV abx with close monitoring and possibly skin graft or flap. Discussed picc placement.  Likely organisms are staph and strep so will target those while awaiting cultures.  Recommendations Place picc Check esr and crp - ordered Cont vanco and zosyn - will plan on dc on on these and can adjust based on culture results  Thank you very much for allowing me to participate in the care of this patient. Please call with questions.   Cheral Marker. Ola Spurr, MD

## 2017-03-09 ENCOUNTER — Encounter: Payer: Self-pay | Admitting: Orthopedic Surgery

## 2017-03-09 LAB — COMPREHENSIVE METABOLIC PANEL
ALBUMIN: 2.7 g/dL — AB (ref 3.5–5.0)
ALT: 11 U/L — ABNORMAL LOW (ref 14–54)
ANION GAP: 9 (ref 5–15)
AST: 15 U/L (ref 15–41)
Alkaline Phosphatase: 82 U/L (ref 38–126)
BILIRUBIN TOTAL: 0.4 mg/dL (ref 0.3–1.2)
BUN: 14 mg/dL (ref 6–20)
CO2: 25 mmol/L (ref 22–32)
Calcium: 9.3 mg/dL (ref 8.9–10.3)
Chloride: 100 mmol/L — ABNORMAL LOW (ref 101–111)
Creatinine, Ser: 0.7 mg/dL (ref 0.44–1.00)
GFR calc Af Amer: >60 mL/min (ref >60)
GLUCOSE: 146 mg/dL — AB (ref 65–99)
POTASSIUM: 4.2 mmol/L (ref 3.5–5.1)
Sodium: 134 mmol/L — ABNORMAL LOW (ref 135–145)
TOTAL PROTEIN: 6.7 g/dL (ref 6.5–8.1)

## 2017-03-09 LAB — C-REACTIVE PROTEIN: CRP: 6.3 mg/dL — ABNORMAL HIGH (ref <1.0)

## 2017-03-09 MED ORDER — PIPERACILLIN-TAZOBACTAM 3.375 G IVPB
3.3750 g | Freq: Three times a day (TID) | INTRAVENOUS | Status: DC
Start: 2017-03-09 — End: 2017-03-10
  Administered 2017-03-09 – 2017-03-10 (×3): 3.375 g via INTRAVENOUS
  Filled 2017-03-09 (×3): qty 50

## 2017-03-09 MED ORDER — SODIUM CHLORIDE 0.9% FLUSH: 10.0000 mL | INTRAVENOUS | Status: DC | PRN

## 2017-03-09 MED ORDER — ONDANSETRON HCL 4 MG PO TABS: 4.0000 mg | ORAL_TABLET | Freq: Four times a day (QID) | ORAL | 0 refills | Status: DC | PRN

## 2017-03-09 MED ORDER — SODIUM CHLORIDE 0.9% FLUSH
10.0000 mL | Freq: Two times a day (BID) | INTRAVENOUS | Status: DC
  Administered 2017-03-09 – 2017-03-10 (×3): 10 mL

## 2017-03-09 MED ORDER — OXYCODONE HCL 5 MG PO TABS: 5.0000 mg | ORAL_TABLET | ORAL | 0 refills | Status: DC | PRN

## 2017-03-09 NOTE — Care Management (Signed)
TC to Dr. Posey Pronto. He will be here at approximately 3:30-4:00 to sign wound vac orders. Dr. Ola Spurr still has to write home IV antibiotics order. PICC line to be placed today.  Patient will need home health SN, PT. IV antibiotics will be for 6 weeks.  Met with patient at bedside to discuss discharge plan. Offered choice of home health agencies. Referral to [facility].  Explained to patient that she may have a copay for IV abx, wound vac, and home health services. [facility] will run insurance and give her further information later today. She is tearful. Reassurance and emotional support given.

## 2017-03-09 NOTE — Progress Notes (Signed)
Peripherally Inserted Central Catheter/Midline Placement  The IV Nurse has discussed with the patient and/or persons authorized to consent for the patient, the purpose of this procedure and the potential benefits and risks involved with this procedure.  The benefits include less needle sticks, lab draws from the catheter, and the patient may be discharged home with the catheter. Risks include, but not limited to, infection, bleeding, blood clot (thrombus formation), and puncture of an artery; nerve damage and irregular heartbeat and possibility to perform a PICC exchange if needed/ordered by physician.  Alternatives to this procedure were also discussed.  Bard Power PICC patient education guide, fact sheet on infection prevention and patient information card has been provided to patient /or left at bedside.    PICC/Midline Placement Documentation  PICC Single Lumen 03/09/17 PICC Right Basilic 40 cm 0 cm (Active)  Indication for Insertion or Continuance of Line Home intravenous therapies (PICC only) 03/09/2017  1:56 PM  Exposed Catheter (cm) 0 cm 03/09/2017  1:56 PM  Site Assessment Clean;Dry;Intact 03/09/2017  1:56 PM  Line Status Flushed;Saline locked;Blood return noted 03/09/2017  1:56 PM  Dressing Type Transparent;Securing device 03/09/2017  1:56 PM  Dressing Status Clean;Dry;Intact;Antimicrobial disc in place 03/09/2017  1:56 PM  Dressing Change Due 03/16/17 03/09/2017  1:56 PM       Romie JumperAlford, Kellye Mizner Terry 03/09/2017, 1:59 PM

## 2017-03-09 NOTE — Evaluation (Signed)
Physical Therapy Evaluation Patient Details Name: [name] Carrillo MRN: [id number] DOB: 06/12/1965 Today's Date: 03/09/2017   History of Present Illness  51 y.o. female who suffered a fall at home. Radiographs in the ED show mid-shaft tibia fracture with proximal fibula fracture. She underwent ORIF of L tibia, pt reports a remote history of RLE fracture.  Clinical Impression  Pt did well with mobility and though she had some fatigue and needed minimal cuing with ambulation she was able to do a more prolonged, in-home appropriate distance w/o LOBs or significant safety issues.  Son will be able to do available 24/7 initially and she reports feeling confident with how she has been scooting up the steps backward that last few days.    Follow Up Recommendations Home health PT;DC plan and follow up therapy as arranged by surgeon    Equipment Recommendations       Recommendations for Other Services       Precautions / Restrictions Precautions Precautions: Fall Restrictions Weight Bearing Restrictions: Yes LLE Weight Bearing: Non weight bearing      Mobility  Bed Mobility Overal bed mobility: Independent             General bed mobility comments: Pt able to easily get herself to sitting at EOB w/o assist  Transfers Overall transfer level: Independent Equipment used: Rolling walker (2 wheeled)             General transfer comment: Pt able to rise and maintain NWBing w/o issue.  Ambulation/Gait Ambulation/Gait assistance: Modified independent (Device/Increase time) Ambulation Distance (Feet): 75 Feet Assistive device: Rolling walker (2 wheeled)       General Gait Details: Pt with some UE fatigue with hopping cadence, but was safe and confident the entire time  Stairs Stairs: (reports she has been sit scooting up them w/o issue)          Wheelchair Mobility    Modified Rankin (Stroke Patients Only)       Balance Overall balance assessment: Independent                                            Pertinent Vitals/Pain Pain Assessment: 0-10 Pain Score: 3  Pain Location: L ankle    Home Living Family/patient expects to be discharged to:: Private residence Living Arrangements: Spouse/significant other;Children Available Help at Discharge: Family(son will initially be around 24/7) Type of Home: House Home Access: Stairs to enter Entrance Stairs-Rails: Left Entrance Stairs-Number of Steps: 2 (1+1 with landing)          Prior Function Level of Independence: Independent         Comments: Independent with ADLs/IADLs, drives. No other falls in the last 12 months. 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        Extremity/Trunk Assessment   Upper Extremity Assessment Upper Extremity Assessment: Overall WFL for tasks assessed    Lower Extremity Assessment Lower Extremity Assessment: Overall WFL for tasks assessed(L ankle/knee NT 2/2 splint)       Communication   Communication: No difficulties  Cognition Arousal/Alertness: Awake/alert Behavior During Therapy: WFL for tasks assessed/performed Overall Cognitive Status: Within Functional Limits for tasks assessed  General Comments      Exercises     Assessment/Plan    PT Assessment Patient needs continued PT services  PT Problem List Decreased strength;Decreased range of motion;Decreased activity tolerance;Decreased balance;Decreased knowledge of use of DME;Decreased safety awareness;Cardiopulmonary status limiting activity;Pain       PT Treatment Interventions DME instruction;Gait training;Stair training;Functional mobility training;Therapeutic activities;Therapeutic exercise;Balance training;Neuromuscular re-education;Patient/family education    PT Goals (Current goals can be found in the Care Plan section)  Acute Rehab PT Goals Patient Stated Goal: go home PT Goal Formulation: With  patient Time For Goal Achievement: 03/23/17 Potential to Achieve Goals: Good    Frequency 7X/week   Barriers to discharge        Co-evaluation               AM-PAC PT "6 Clicks" Daily Activity  Outcome Measure Difficulty turning over in bed (including adjusting bedclothes, sheets and blankets)?: None Difficulty moving from lying on back to sitting on the side of the bed? : None Difficulty sitting down on and standing up from a chair with arms (e.g., wheelchair, bedside commode, etc,.)?: None Help needed moving to and from a bed to chair (including a wheelchair)?: None Help needed walking in hospital room?: A Little Help needed climbing 3-5 steps with a railing? : A Little 6 Click Score: 22    End of Session Equipment Utilized During Treatment: Gait belt Activity Tolerance: Patient limited by fatigue Patient left: with chair alarm set;with call bell/phone within reach;with family/visitor present   PT Visit Diagnosis: Muscle weakness (generalized) (M62.81);Difficulty in walking, not elsewhere classified (R26.2)    Time: [id number] PT Time Calculation (min) (ACUTE ONLY): 27 min   Charges:   PT Evaluation $PT Eval Low Complexity: 1 Low PT Treatments $Gait Training: 8-22 mins   PT G Codes:   PT G-Codes **NOT FOR INPATIENT CLASS** Functional Assessment Tool Used: AM-PAC 6 Clicks Basic Mobility Functional Limitation: Mobility: Walking and moving around Mobility: Walking and Moving Around Current Status ([id number]([id number]): At least 20 percent but less than 40 percent impaired, limited or restricted Mobility: Walking and Moving Around Goal Status [phone]([id number]): At least 1 percent but less than 20 percent impaired, limited or restricted    [name] Carrillo, DPT 03/09/2017, 12:47 PM

## 2017-03-09 NOTE — Progress Notes (Addendum)
Subjective: 1 Day Post-Op Procedure(s) (LRB): HARDWARE REMOVAL-LEFT LEG AND ANKLE (Left) APPLICATION OF WOUND VAC (Left) Patient reports pain as mild.   Patient seen in rounds with Dr. Allena KatzPatel. Patient is well, and has had no acute complaints or problems Plan is to go Home after hospital stay. Negative for chest pain and shortness of breath Fever: no Gastrointestinal: Negative for nausea and vomiting  Objective: Vital signs in last 24 hours: Temp:  [98.2 F (36.8 C)-99.4 F (37.4 C)] 98.5 F (36.9 C) (12/06 0423) Pulse Rate:  [66-123] 66 (12/06 0423) Resp:  [12-26] 16 (12/05 1949) BP: (101-141)/(46-87) 110/57 (12/06 0423) SpO2:  [92 %-100 %] 96 % (12/06 0423) Weight:  [74.4 kg (164 lb)] 74.4 kg (164 lb) (12/05 1324)  Intake/Output from previous day:  Intake/Output Summary (Last 24 hours) at 03/09/2017 0623 Last data filed at 03/09/2017 0319 Gross per 24 hour  Intake 2720 ml  Output 100 ml  Net 2620 ml    Intake/Output this shift: Total I/O In: 1020 [P.O.:720; IV Piggyback:300] Out: -   Labs: Recent Labs    03/08/17 1402  HGB 11.9*   Recent Labs    03/08/17 1402  WBC 11.6*  RBC 3.26*  HCT 34.8*  PLT 353   Recent Labs    03/08/17 1402 03/09/17 0327  NA  --  134*  K  --  4.2  CL  --  100*  CO2  --  25  BUN  --  14  CREATININE 0.61 0.70  GLUCOSE  --  146*  CALCIUM  --  9.3   No results for input(s): LABPT, INR in the last 72 hours.   EXAM General - Patient is Alert and Oriented Extremity - The patient has bilateral foot and ankle sensation with decreased sensation on the medial foot and large toe. Extensor hallucis longus is weak. Dorsiflexion and plantarflexion of the toes is intact. Wound VAC is working with suction.  Dressing/Incision - clean, dry, wound VAC is suctioning with the dressing intact   Past Medical History:  Diagnosis Date  . Anxiety   . Depression     Assessment/Plan: 1 Day Post-Op Procedure(s) (LRB): HARDWARE REMOVAL-LEFT  LEG AND ANKLE (Left) APPLICATION OF WOUND VAC (Left) Active Problems:   Tibia fracture  Estimated body mass index is 27.29 kg/m as calculated from the following:   Height as of this encounter: 5\' 5"  (1.651 m).   Weight as of this encounter: 74.4 kg (164 lb). Advance diet Up with therapy  Possible discharge home today after PICC line. - NWB LLE - In splint that can be removed by unwrapping bias wrap. VAC change can then be performed as needed with re-application of splint and wrap.  - Maintain VAC.  - Infectious Diseases team consulted for assistance with Abx management. Will likely need PICC today.  - Continue [employer] IV Abx until cultures result (Gram stain already growing gram pos cocci) - Home health will be needed for PICC, IV Abx, and wound VAC changes 3x/week.  - Dr. Allena KatzPatel Will discuss case with Plastic Surgery team for assistance with open wound - Lovenox for DVT ppx while inpatient. Can be discharged on ASA 325mg  bid x 4 weeks.    [name] Skeensodd Mundy, PA-C Orthopaedic Surgery 03/09/2017, 6:23 AM    ADDENDUM: Feeling well. PICC line placed.  LLE Exam: - No significant EHL/ankle DF function, able to flextoes and PF ankle - numbness in in sp/dp distributions over foot is now significantly improved. SILT in t/s/s distr  over foot - Foot wwp - VAC functioning well.  - Splint in place  Cx growing Staph aureus  Agree with plan as outlined above except will likely be DC'd tomorrow. Defer Abx management to ID team.

## 2017-03-09 NOTE — Progress Notes (Addendum)
Infectious Disease Long Term IV Antibiotic Orders [name] Carrillo [birth year]  Diagnosis: Tibial osteomyelitis  Culture results Staph aureus- cx pending.   LABS Lab Results  Component Value Date   CREATININE 0.70 03/09/2017   Lab Results  Component Value Date   WBC 11.6 (H) 03/08/2017   HGB 11.9 (L) 03/08/2017   HCT 34.8 (L) 03/08/2017   MCV 107.0 (H) 03/08/2017   PLT 353 03/08/2017   Lab Results  Component Value Date   ESRSEDRATE 95 (H) 03/08/2017   Lab Results  Component Value Date   CRP 6.3 (H) 03/08/2017    Allergies: No Known Allergies  Discharge antibiotics Vancomycin           1250       mg  every   12            hours .    (DOSE TO BE CONFIRMED AM OF 12/7) Goal vancomycin trough 15-20.    Pharmacy to adjust dosing based on levels  PICC Care per protocol Labs weekly while on IV antibiotics -FAX weekly labs to [phone] CBC w diff   Comprehensive met panel Vancomycin Trough   CRP ESR  Planned duration of antibiotics 6 weeks   Stop date 04/19/16  Follow up clinic date  - 4 weeks.  Leonel Ramsay, MD

## 2017-03-09 NOTE — Evaluation (Signed)
Occupational Therapy Evaluation Patient Details Name: [name] Carrillo MRN: [name] DOB: 01/01/1966 Today's Date: 03/09/2017    History of Present Illness 51 y.o. female who suffered a fall at home. Radiographs in the ED show mid-shaft tibia fracture with proximal fibula fracture. She underwent ORIF of L tibia, pt reports a remote history of RLE fracture.   Clinical Impression   Pt seen for OT evaluation this date. Spouse in room for session. Per pt/spouse report, spouse is able to assist as needed once home. Pt/spouse have been managing ADL tasks at home the past couple weeks while she has been NWBing and "have it pretty well figured out." Pt/spouse deny need for additional OT services. Will sign off. Please re-consult if additional needs arise.    Follow Up Recommendations  No OT follow up    Equipment Recommendations  None recommended by OT    Recommendations for Other Services       Precautions / Restrictions Precautions Precautions: Fall Restrictions Weight Bearing Restrictions: Yes LLE Weight Bearing: Non weight bearing      Mobility Bed Mobility             General bed mobility comments: deferred, up in recliner  Transfers Overall transfer level: Independent Equipment used: Rolling walker (2 wheeled)             General transfer comment: Pt able to rise and maintain NWBing w/o issue.    Balance Overall balance assessment: Independent                                         ADL either performed or assessed with clinical judgement   ADL Overall ADL's : Modified independent                                       General ADL Comments: spouse able to assist as needed; pt/spouse have been managing ADL tasks at home the past couple weeks while she has been NWBing.     Vision         Perception     Praxis      Pertinent Vitals/Pain Pain Assessment: 0-10 Pain Score: 3  Pain Location: L ankle     Hand  Dominance Right   Extremity/Trunk Assessment Upper Extremity Assessment Upper Extremity Assessment: Overall WFL for tasks assessed   Lower Extremity Assessment Lower Extremity Assessment: Overall WFL for tasks assessed;Defer to PT evaluation   Cervical / Trunk Assessment Cervical / Trunk Assessment: Normal   Communication Communication Communication: No difficulties   Cognition Arousal/Alertness: Awake/alert Behavior During Therapy: WFL for tasks assessed/performed Overall Cognitive Status: Within Functional Limits for tasks assessed                                     General Comments       Exercises     Shoulder Instructions      Home Living Family/patient expects to be discharged to:: Private residence Living Arrangements: Spouse/significant other;Children Available Help at Discharge: Family(son will initially be aroudn 24/7) Type of Home: House Home Access: Stairs to enter Enter[employer] CorporationEntrance Stairs-Number of Steps: 2 (1+1 with landing) Entrance Stairs-Rails: Left Home Layout: One level     Bathroom Shower/Tub: Walk-in shower  Bathroom Toilet: Standard     Home Equipment: Shower seat          Prior Functioning/Environment Level of Independence: Independent        Comments: Independent with ADLs/IADLs, drives. No other falls in the last 12 months. Working (primarily desk work)        OT Problem List:        OT Treatment/Interventions:      OT Goals(Current goals can be found in the care plan section) Acute Rehab OT Goals Patient Stated Goal: go home OT Goal Formulation: All assessment and education complete, DC therapy  OT Frequency:     Barriers to D/C:            Co-evaluation              AM-PAC PT "6 Clicks" Daily Activity     Outcome Measure Help from another person eating meals?: None Help from another person taking care of personal grooming?: None Help from another person toileting, which includes using toliet, bedpan,  or urinal?: None Help from another person bathing (including washing, rinsing, drying)?: A Little Help from another person to put on and taking off regular upper body clothing?: None Help from another person to put on and taking off regular lower body clothing?: A Little 6 Click Score: 22   End of Session    Activity Tolerance: Patient tolerated treatment well Patient left: in chair;with call bell/phone within reach;with chair alarm set;with family/visitor present  OT Visit Diagnosis: Other abnormalities of gait and mobility (R26.89)                Time: [id number] OT Time Calculation (min): 9 min Charges:  OT General Charges $OT Visit: 1 Visit OT Evaluation $OT Eval Low Complexity: 1 Low G-Codes: OT G-codes **NOT FOR INPATIENT CLASS** Functional Assessment Tool Used: Clinical judgement;AM-PAC 6 Clicks Daily Activity Functional Limitation: Self care Self Care Current Status ([id number]): At least 1 percent but less than 20 percent impaired, limited or restricted Self Care Goal Status ([id number](G8988): At least 1 percent but less than 20 percent impaired, limited or restricted Self Care Discharge Status [phone]): At least 1 percent but less than 20 percent impaired, limited or restricted   Richrd PrimeJamie Stiller, MPH, MS, OTR/L ascom [phone] 03/09/17, 1:23 PM

## 2017-03-09 NOTE — Discharge Instructions (Signed)
INSTRUCTIONS AFTER Surgery  o Remove items at home which could result in a fall. This includes throw rugs or furniture in walking pathways o ICE to the affected joint every three hours while awake for 30 minutes at a time, for at least the first 3-5 days, and then as needed for pain and swelling.  Continue to use ice for pain and swelling. You may notice swelling that will progress down to the foot and ankle.  This is normal after surgery.  Elevate your leg when you are not up walking on it.   o Continue to use the breathing machine you got in the hospital (incentive spirometer) which will help keep your temperature down.  It is common for your temperature to cycle up and down following surgery, especially at night when you are not up moving around and exerting yourself.  The breathing machine keeps your lungs expanded and your temperature down.   DIET:  As you were doing prior to hospitalization, we recommend a well-balanced diet.  DRESSING / WOUND CARE / SHOWERING  Keep the dressing on your left lower extremity. The wound VAC will be changed 3 times a week by home health physical therapy. No showering or getting this wet. The PICC line will be managed by home health physical therapy.  ACTIVITY  o Increase activity slowly as tolerated, but follow the weight bearing instructions below.   o No driving for 6 weeks or until further direction given by your physician.  You cannot drive while taking narcotics.  o No lifting or carrying greater than 10 lbs. until further directed by your surgeon. o Avoid periods of inactivity such as sitting longer than an hour when not asleep. This helps prevent blood clots.  o You may return to work once you are authorized by your doctor.     WEIGHT BEARING  Nonweightbearing on the left lower extremity.   EXERCISES Left ankle and toe exercises with up and down motion is appropriate. No physical therapy on the left ankle.  CONSTIPATION  Constipation is  defined medically as fewer than three stools per week and severe constipation as less than one stool per week.  Even if you have a regular bowel pattern at home, your normal regimen is likely to be disrupted due to multiple reasons following surgery.  Combination of anesthesia, postoperative narcotics, change in appetite and fluid intake all can affect your bowels.   YOU MUST use at least one of the following options; they are listed in order of increasing strength to get the job done.  They are all available over the counter, and you may need to use some, POSSIBLY even all of these options:    Drink plenty of fluids (prune juice may be helpful) and high fiber foods Colace 100 mg by mouth twice a day  Senokot for constipation as directed and as needed Dulcolax (bisacodyl), take with full glass of water  Miralax (polyethylene glycol) once or twice a day as needed.  If you have tried all these things and are unable to have a bowel movement in the first 3-4 days after surgery call either your surgeon or your primary doctor.    If you experience loose stools or diarrhea, hold the medications until you stool forms back up.  If your symptoms do not get better within 1 week or if they get worse, check with your doctor.  If you experience "the worst abdominal pain ever" or develop nausea or vomiting, please contact the office immediately  for further recommendations for treatment.   ITCHING:  If you experience itching with your medications, try taking only a single pain pill, or even half a pain pill at a time.  You can also use Benadryl over the counter for itching or also to help with sleep.    MEDICATIONS:  See your medication summary on the After Visit Summary that nursing will review with you.  You may have some home medications which will be placed on hold until you complete the course of blood thinner medication.  It is important for you to complete the blood thinner medication as  prescribed.  PRECAUTIONS:  If you experience chest pain or shortness of breath - call 911 immediately for transfer to the hospital emergency department.   If you develop a fever greater that 101 F, purulent drainage from wound, increased redness or drainage from wound, foul odor from the wound/dressing, or calf pain - CONTACT YOUR SURGEON.                                                   FOLLOW-UP APPOINTMENTS:  If you do not already have a post-op appointment, please call the office for an appointment to be seen by your surgeon.  Guidelines for how soon to be seen are listed in your After Visit Summary, but are typically between 1-4 weeks after surgery.  OTHER INSTRUCTIONS:     MAKE SURE YOU:   Understand these instructions.   Get help right away if you are not doing well or get worse.    Thank you for letting us be a part of your medical care team.  It is a privilege we respect greatly.  We hope these instructions will help you stay on track for a fast and full recovery!

## 2017-03-09 NOTE — Discharge Summary (Signed)
Physician Discharge Summary  Subjective: 2 Days Post-Op Procedure(s) (LRB): HARDWARE REMOVAL-LEFT LEG AND ANKLE (Left) APPLICATION OF WOUND VAC (Left) Patient reports pain as mild to moderate.   Patient seen in rounds with Dr. Allena KatzPatel. Patient is well, and has had no acute complaints or problems. The patient slept well. Patient is ready to go home with home health nursing  Physician Discharge Summary  Patient ID: [name] Carrillo MRN: [id number] DOB/AGE: 52/10/1965 52 y.o.  Admit date: 03/08/2017 Discharge date: 03/10/2017  Admission Diagnoses:  Discharge Diagnoses:  Active Problems:   Tibia fracture   Discharged Condition: fair  Hospital Course: The patient is postop day 2 from exchange of left tibial implants, wound culture with irrigation and debridement, and insertion of a new tibial rod. The patient has done well with pain management since surgery. She has a wound VAC on her left lower extremity that is functioning. She received a PICC line yesterday and is be able to go home  She will be on IV vancomycin for 6 weeks.  Treatments: surgery:  1. Exchange intramedullary nailing of Left tibia  2. Irrigation and debridement of L tibia 3. Application of wound VAC device  SURGEON: Rosealee AlbeeSunny H. Patel, MD  ASSISTANTS: none  EBL: 100cc  COMPONENTS:  [employer] T2 Nail: 10 x 315mm; 2 proximal interlocking screws; 1 distal interolocking screws.    Discharge Exam: Blood pressure 124/64, pulse 75, temperature 98.5 F (36.9 C), temperature source Oral, resp. rate 17, height 5\' 5"  (1.651 m), weight 74.4 kg (164 lb), last menstrual period 09/07/2015, SpO2 97 %.   Disposition: 01-Home or Self Care    Follow-up Information    Signa KellPatel, Sunny, MD On 03/22/2017.   Specialty:  Orthopedic Surgery Why:  @ 2:00 pm Contact information: [address] [place] [address] [phone]           Signed: Lenard ForthMUNDY, Allegra Cerniglia 03/10/2017, 6:31 AM   Objective: Vital signs in last 24  hours: Temp:  [98.5 F (36.9 C)-98.6 F (37 C)] 98.5 F (36.9 C) (12/06 1955) Pulse Rate:  [74-82] 75 (12/06 1955) Resp:  [17-20] 17 (12/06 1955) BP: (103-124)/(50-64) 124/64 (12/06 1955) SpO2:  [97 %-99 %] 97 % (12/06 1955)  Intake/Output from previous day:  Intake/Output Summary (Last 24 hours) at 03/10/2017 0631 Last data filed at 03/10/2017 0349 Gross per 24 hour  Intake 1220 ml  Output -  Net 1220 ml    Intake/Output this shift: Total I/O In: 930 [P.O.:480; IV Piggyback:450] Out: -   Labs: Recent Labs    03/08/17 1402  HGB 11.9*   Recent Labs    03/08/17 1402  WBC 11.6*  RBC 3.26*  HCT 34.8*  PLT 353   Recent Labs    03/09/17 0327 03/10/17 0349  NA 134* 137  K 4.2 3.4*  CL 100* 100*  CO2 25 28  BUN 14 11  CREATININE 0.70 0.73  GLUCOSE 146* 114*  CALCIUM 9.3 8.8*   No results for input(s): LABPT, INR in the last 72 hours.  EXAM: General - Patient is Alert and Oriented Extremity - the patient has improved sensation involving the medial aspect of the foot. The lateral and dorsal surface of the foot has good sensation. The patient is able to slightly flex the extensor hallucis longus and large toe. She has good flexion of the other digits. The splint and dressing are intact.  Incision - clean, dry, with the wound VAC functioning   Assessment/Plan: 2 Days Post-Op Procedure(s) (LRB): HARDWARE REMOVAL-LEFT  LEG AND ANKLE (Left) APPLICATION OF WOUND VAC (Left) Procedure(s) (LRB): HARDWARE REMOVAL-LEFT LEG AND ANKLE (Left) APPLICATION OF WOUND VAC (Left) Past Medical History:  Diagnosis Date  . Anxiety   . Depression    Active Problems:   Tibia fracture  Estimated body mass index is 27.29 kg/m as calculated from the following:   Height as of this encounter: $RemoveBefo [id number]$5\' 5" (164 lb). Advance diet Up with therapy  Discharge home today after PICC line placement Diet - Regular diet Follow up - in 1  week Activity - NWB Disposition - Home on IV vancomycin Condition Upon Discharge - Stable DVT Prophylaxis - Aspirin  Dedra Skeensodd Yannet Rincon, PA-C Orthopaedic Surgery 03/10/2017, 6:31 AM

## 2017-03-09 NOTE — Progress Notes (Signed)
[facility] INFECTIOUS DISEASE PROGRESS NOTE Date of Admission:  03/08/2017     ID: [name] Carrillo is a 51 y.o. female with Tib osteomyelitis and wound Active Problems:   Tibia fracture   Subjective: No fevers. les pain. Wound vac in place   ROS  Eleven systems are reviewed and negative except per hpi  Medications:  Antibiotics Given (last 72 hours)    Date/Time Action Medication Dose Rate   03/08/17 0920 Given   piperacillin-tazobactam (ZOSYN) IVPB 3.375 g 3.375 g    03/08/17 0934 Given   vancomycin (VANCOCIN) IVPB 1000 mg/200 mL premix 1,000 mg    03/08/17 1515 New Bag/Given   piperacillin-tazobactam (ZOSYN) IVPB 3.375 g 3.375 g 12.5 mL/hr   03/08/17 2112 New Bag/Given   vancomycin (VANCOCIN) IVPB 1000 mg/200 mL premix 1,000 mg 200 mL/hr   03/08/17 2318 New Bag/Given   piperacillin-tazobactam (ZOSYN) IVPB 3.375 g 3.375 g 12.5 mL/hr   03/09/17 0100 New Bag/Given   piperacillin-tazobactam (ZOSYN) IVPB 3.375 g 3.375 g 12.5 mL/hr   03/09/17 0954 New Bag/Given   vancomycin (VANCOCIN) IVPB 1000 mg/200 mL premix 1,000 mg 200 mL/hr     . acetaminophen  1,000 mg Oral Q8H  . docusate sodium  100 mg Oral BID  . enoxaparin (LOVENOX) injection  40 mg Subcutaneous Q24H    Objective: Vital signs in last 24 hours: Temp:  [98.2 F (36.8 C)-99.4 F (37.4 C)] 98.6 F (37 C) (12/06 1027) Pulse Rate:  [66-123] 82 (12/06 1027) Resp:  [16-20] 20 (12/06 1027) BP: (101-129)/(55-70) 103/55 (12/06 1027) SpO2:  [95 %-99 %] 99 % (12/06 1027) Constitutional:  oriented to person, place, and time. appears well-developed and well-nourished. No distress.  HENT: [place]/AT, PERRLA, no scleral icterus Mouth/Throat: Oropharynx is clear and moist. No oropharyngeal exudate.  Cardiovascular: Normal rate, regular rhythm and normal heart sounds. Exam reveals no gallop and no friction rub.  No murmur heard.  Pulmonary/Chest: Effort normal and breath sounds normal. No respiratory distress.  has no wheezes.   Neck = supple, no nuchal rigidity Abdominal: Soft. Bowel sounds are normal.  exhibits no distension. There is no tenderness.  Lymphadenopathy: no cervical adenopathy. No axillary adenopathy Neurological: alert and oriented to person, place, and time.  Skin: L leg wrapped post op   Psychiatric: a normal mood and affect.  behavior is normal.     Lab Results Recent Labs    03/08/17 1402 03/09/17 0327  WBC 11.6*  --   HGB 11.9*  --   HCT 34.8*  --   NA  --  134*  K  --  4.2  CL  --  100*  CO2  --  25  BUN  --  14  CREATININE 0.61 0.70    Microbiology: Results for orders placed or performed during the hospital encounter of 03/08/17  Aerobic/Anaerobic Culture (surgical/deep wound)     Status: None (Preliminary result)   Collection Time: 03/08/17  9:34 AM  Result Value Ref Range Status   Specimen Description WOUND 1LEFT TIBIA FRACTURE  Final   Special Requests NONE  Final   Gram Stain   Final    RARE WBC PRESENT, PREDOMINANTLY PMN RARE GRAM POSITIVE COCCI Performed at [facility], [place] [address], [place], [place] [address]    Culture PENDING  Incomplete   Report Status PENDING  Incomplete  Aerobic/Anaerobic Culture (surgical/deep wound)     Status: None (Preliminary result)   Collection Time: 03/08/17  9:35 AM  Result Value  Ref Range Status   Specimen Description WOUND 2 LEFT TIBIA FX  Final   Special Requests NONE  Final   Gram Stain   Final    RARE WBC PRESENT, PREDOMINANTLY PMN RARE GRAM POSITIVE COCCI Performed at [facility], [place] [address] [place], [place] [address]    Culture PENDING  Incomplete   Report Status PENDING  Incomplete  Aerobic/Anaerobic Culture (surgical/deep wound)     Status: None (Preliminary result)   Collection Time: 03/08/17  9:36 AM  Result Value Ref Range Status   Specimen Description WOUND 3 LEFT TIBIA FX  Final   Special Requests NONE  Final   Gram Stain   Final    RARE WBC PRESENT, PREDOMINANTLY PMN NO ORGANISMS SEEN     Culture   Final    CULTURE REINCUBATED FOR BETTER GROWTH Performed at [facility], [place] [address] [place], [place] [address]    Report Status PENDING  Incomplete  Aerobic/Anaerobic Culture (surgical/deep wound)     Status: None (Preliminary result)   Collection Time: 03/08/17  9:37 AM  Result Value Ref Range Status   Specimen Description WOUND 1 LEFT MEDIAL ANKLE  Final   Special Requests NONE  Final   Gram Stain   Final    RARE WBC PRESENT, PREDOMINANTLY PMN NO ORGANISMS SEEN Performed at [facility], [place] [address] [place], [place] [address]    Culture RARE STAPHYLOCOCCUS AUREUS  Final   Report Status PENDING  Incomplete  Aerobic/Anaerobic Culture (surgical/deep wound)     Status: None (Preliminary result)   Collection Time: 03/08/17  9:38 AM  Result Value Ref Range Status   Specimen Description WOUND 2 LEFT MEDIAL ANKLE  Final   Special Requests NONE  Final   Gram Stain   Final    NO WBC SEEN NO ORGANISMS SEEN Performed at [facility], [place] [address], [place], [place] [address]    Culture PENDING  Incomplete   Report Status PENDING  Incomplete  Aerobic/Anaerobic Culture (surgical/deep wound)     Status: None (Preliminary result)   Collection Time: 03/08/17  9:38 AM  Result Value Ref Range Status   Specimen Description WOUND 3LEFT MEDICAL ANKLE BONE  Final   Special Requests NONE  Final   Gram Stain   Final    RARE WBC PRESENT, PREDOMINANTLY PMN NO ORGANISMS SEEN Performed at [facility], [place] [address], [place], [place] [address]    Culture PENDING  Incomplete   Report Status PENDING  Incomplete  Aerobic/Anaerobic Culture (surgical/deep wound)     Status: None (Preliminary result)   Collection Time: 03/08/17  9:38 AM  Result Value Ref Range Status   Specimen Description WOUND 1 INTERMEDULLARY CANAL  Final   Special Requests NONE  Final   Gram Stain   Final    MODERATE WBC PRESENT, PREDOMINANTLY PMN FEW GRAM POSITIVE COCCI Performed  at [facility], [place] [address] [place], [place] [address]    Culture PENDING  Incomplete   Report Status PENDING  Incomplete  Aerobic/Anaerobic Culture (surgical/deep wound)     Status: None (Preliminary result)   Collection Time: 03/08/17  9:38 AM  Result Value Ref Range Status   Specimen Description WOUND 2 INTERMEDULLARY CANAL  Final   Special Requests NONE  Final   Gram Stain   Final    RARE WBC PRESENT, PREDOMINANTLY PMN RARE GRAM POSITIVE COCCI    Culture   Final  FEW STAPHYLOCOCCUS AUREUS SUSCEPTIBILITIES TO FOLLOW Performed at [facility], [place] [address] [place], [place] [address]    Report Status PENDING  Incomplete   Lab Results  Component Value Date   CRP 6.3 (H) 03/08/2017   Lab Results  Component Value Date   ESRSEDRATE 95 (H) 03/08/2017    Studies/Results: Dg Ankle 2 Views Left  Result Date: 03/08/2017 CLINICAL DATA:  Elective surgery. EXAM: LEFT ANKLE - 2 VIEW; DG C-ARM 61-120 MIN COMPARISON:  02/12/2017 FINDINGS: Tibial nail fixation of a shaft fracture with proximal and distal interlocking screws. Good anatomic alignment. There is a fibular neck fracture with mild anterior displacement. IMPRESSION: 1. Fluoroscopy for tibial nail fixation. 2. Mildly displaced fibular neck fracture. 3. No unexpected finding. Electronically Signed   By: Monte Fantasia M.D.   On: 03/08/2017 11:16   Dg Tibia/fibula Left Port  Result Date: 03/08/2017 CLINICAL DATA:  Status post tibial and fibular fractures EXAM: PORTABLE LEFT TIBIA AND FIBULA - 2 VIEW COMPARISON:  02/12/17 FINDINGS: Medullary rod is again seen and stable. The distal fixation screws appear to have withdrawn somewhat in the interval when compared with the prior exam. The proximal fixation screws appear within normal limits. No significant callus formation is noted. No other focal abnormality is seen. The midshaft tibial and proximal fibular fractures are again noted. Some air is noted in the medial soft  tissues of the mid lower leg. IMPRESSION: Some slight withdrawal of fixation screws distally in the tibia. The known tibial and fibular fractures are again seen and relatively stable. Some air is noted within the medial soft tissues. Electronically Signed   By: Inez Catalina M.D.   On: 03/08/2017 09:05   Dg C-arm 61-120 Min  Result Date: 03/08/2017 CLINICAL DATA:  Elective surgery. EXAM: LEFT ANKLE - 2 VIEW; DG C-ARM 61-120 MIN COMPARISON:  02/12/2017 FINDINGS: Tibial nail fixation of a shaft fracture with proximal and distal interlocking screws. Good anatomic alignment. There is a fibular neck fracture with mild anterior displacement. IMPRESSION: 1. Fluoroscopy for tibial nail fixation. 2. Mildly displaced fibular neck fracture. 3. No unexpected finding. Electronically Signed   By: Monte Fantasia M.D.   On: 03/08/2017 11:16    Assessment/Plan: [name] Carrillo is a 51 y.o. female with L  leg wound over tibia and tibial osteomyelitis following IM nail placement 11/11 for tib fib fracture.  She had done well but then developed some redness at site and started bactrim 11/26 however infection worsened. She underwent 12/5 removal of hardware and has wound vac over a tibial skin defect with exposed bone. ESR 95, CRP 6.3. Cx  with Staph aureus -sensis pending.  The treatment will involve likely 4-6 weeks IV abx with close monitoring and possibly skin graft or flap.   Recommendations Cont vancomycin - trough being checked tomorrow AM at 730- will have final dosing recs at that time.  If cx ends up being MSSA will change to IV ancef as otpt.   Thank you very much for the consult. Will follow with you.  Leonel Ramsay   03/09/2017, 1:35 PM

## 2017-03-10 LAB — COMPREHENSIVE METABOLIC PANEL
ALBUMIN: 2.6 g/dL — AB (ref 3.5–5.0)
ALT: 10 U/L — ABNORMAL LOW (ref 14–54)
ANION GAP: 9 (ref 5–15)
AST: 13 U/L — AB (ref 15–41)
Alkaline Phosphatase: 67 U/L (ref 38–126)
BUN: 11 mg/dL (ref 6–20)
CHLORIDE: 100 mmol/L — AB (ref 101–111)
CO2: 28 mmol/L (ref 22–32)
Calcium: 8.8 mg/dL — ABNORMAL LOW (ref 8.9–10.3)
Creatinine, Ser: 0.73 mg/dL (ref 0.44–1.00)
GFR calc Af Amer: >60 mL/min (ref >60)
GFR calc non Af Amer: >60 mL/min (ref >60)
GLUCOSE: 114 mg/dL — AB (ref 65–99)
POTASSIUM: 3.4 mmol/L — AB (ref 3.5–5.1)
Sodium: 137 mmol/L (ref 135–145)
Total Bilirubin: 0.5 mg/dL (ref 0.3–1.2)
Total Protein: 6 g/dL — ABNORMAL LOW (ref 6.5–8.1)

## 2017-03-10 LAB — VANCOMYCIN, TROUGH: VANCOMYCIN TR: 12 ug/mL — AB (ref 15–20)

## 2017-03-10 MED ORDER — VANCOMYCIN HCL 10 G IV SOLR
1250.0000 mg | Freq: Two times a day (BID) | INTRAVENOUS | Status: DC
  Filled 2017-03-10 (×2): qty 1250

## 2017-03-10 MED ORDER — SODIUM CHLORIDE 0.9% FLUSH: 10.0000 mL | INTRAVENOUS | 1 refills | Status: DC | PRN

## 2017-03-10 MED ORDER — VANCOMYCIN HCL 10 G IV SOLR: 1250.0000 mg | Freq: Two times a day (BID) | INTRAVENOUS | 0 refills | Status: DC

## 2017-03-10 MED ORDER — VANCOMYCIN HCL IN DEXTROSE 1-5 GM/200ML-% IV SOLN: 1000.0000 mg | Freq: Two times a day (BID) | INTRAVENOUS | 6 refills | Status: DC

## 2017-03-10 NOTE — Progress Notes (Signed)
PHARMACY CONSULT NOTE FOR:  OUTPATIENT  PARENTERAL ANTIBIOTIC THERAPY (OPAT)  Indication: Tibial OM Regimen: Vancomycin 1.25 gm IV Q12H End date: 04/19/2016  IV antibiotic discharge orders are pended. To discharging provider:  please sign these orders via discharge navigator,  Select New Orders & click on the button choice - Manage This Unsigned Work.     Thank you for allowing pharmacy to be a part of this patient's care.  Carola FrostNathan A Natally Ribera, Pharm.D., BCPS Clinical Pharmacist 03/10/2017, 10:41 AM

## 2017-03-10 NOTE — Progress Notes (Signed)
ID E note Pt for DC today on IV vanco Will follow in 4 weeks as otpt.

## 2017-03-10 NOTE — Care Management Note (Signed)
Case Management Note  Patient Details  Name: [name] Carrillo MRN: [id number] Date of Birth: 03/21/1966  Subjective/Objective:  Discharging today. Son will be available to assist patient at discharge.                   Action/Plan: [facility] has been notified. They will start care at approximately 5 pm today and give first home dose of IV vancomycin. Wound vac orders are in and wound vac will be delivered today prior to discharge. Patient has been updated. She has been presented with copay information. SN and PT per Dr. Allena KatzPatel. Discharging on ASA.   Expected Discharge Date:  03/10/17               Expected Discharge Plan:  Home w Home Health Services  In-House Referral:     Discharge planning Services  CM Consult  Post Acute Care Choice:  Durable Medical Equipment, Home Health Choice offered to:  Patient  DME Arranged:  Vac DME Agency:  [facility]  HH Arr[facility]  [facility]  Status of Service:  Completed, signed off  If discussed at Long Length of Stay Meetings, dates discussed:    Additional Comments:  Marily MemosLisa M Tyaire Odem, RN 03/10/2017, 8:49 AM

## 2017-03-10 NOTE — Progress Notes (Signed)
[facility] INFECTIOUS DISEASE PROGRESS NOTE Date of Admission:  03/08/2017     ID: [name] Carrillo is a 51 y.o. female with Tib osteomyelitis and wound Active Problems:   Tibia fracture   Subjective: No fevers ready for dc   ROS  Eleven systems are reviewed and negative except per hpi  Medications:  Antibiotics Given (last 72 hours)    Date/Time Action Medication Dose Rate   03/08/17 0920 Given   piperacillin-tazobactam (ZOSYN) IVPB 3.375 g 3.375 g    03/08/17 0934 Given   vancomycin (VANCOCIN) IVPB 1000 mg/200 mL premix 1,000 mg    03/08/17 1515 New Bag/Given   piperacillin-tazobactam (ZOSYN) IVPB 3.375 g 3.375 g 12.5 mL/hr   03/08/17 2112 New Bag/Given   vancomycin (VANCOCIN) IVPB 1000 mg/200 mL premix 1,000 mg 200 mL/hr   03/08/17 2318 New Bag/Given   piperacillin-tazobactam (ZOSYN) IVPB 3.375 g 3.375 g 12.5 mL/hr   03/09/17 7253 New Bag/Given   piperacillin-tazobactam (ZOSYN) IVPB 3.375 g 3.375 g 12.5 mL/hr   03/09/17 0954 New Bag/Given   vancomycin (VANCOCIN) IVPB 1000 mg/200 mL premix 1,000 mg 200 mL/hr   03/09/17 1538 New Bag/Given   piperacillin-tazobactam (ZOSYN) IVPB 3.375 g 3.375 g 12.5 mL/hr   03/09/17 2104 New Bag/Given   vancomycin (VANCOCIN) IVPB 1000 mg/200 mL premix 1,000 mg 200 mL/hr   03/09/17 2301 New Bag/Given   piperacillin-tazobactam (ZOSYN) IVPB 3.375 g 3.375 g 12.5 mL/hr   03/10/17 6644 New Bag/Given   piperacillin-tazobactam (ZOSYN) IVPB 3.375 g 3.375 g 12.5 mL/hr   03/10/17 0942 New Bag/Given   vancomycin (VANCOCIN) IVPB 1000 mg/200 mL premix 1,000 mg 200 mL/hr     . docusate sodium  100 mg Oral BID  . enoxaparin (LOVENOX) injection  40 mg Subcutaneous Q24H  . sodium chloride flush  10-40 mL Intracatheter Q12H    Objective: Vital signs in last 24 hours: Temp:  [98.5 F (36.9 C)-98.6 F (37 C)] 98.5 F (36.9 C) (12/07 0721) Pulse Rate:  [74-75] 74 (12/07 0721) Resp:  [17-18] 18 (12/07 0721) BP: (111-124)/(50-68) 120/68 (12/07  0721) SpO2:  [97 %-98 %] 97 % (12/07 0721) Constitutional:  oriented to person, place, and time. appears well-developed and well-nourished. No distress.  HENT: [place]/AT, PERRLA, no scleral icterus Mouth/Throat: Oropharynx is clear and moist. No oropharyngeal exudate.  Cardiovascular: Normal rate, regular rhythm and normal heart sounds. Exam reveals no gallop and no friction rub.  No murmur heard.  Pulmonary/Chest: Effort normal and breath sounds normal. No respiratory distress.  has no wheezes.  Neck = supple, no nuchal rigidity Abdominal: Soft. Bowel sounds are normal.  exhibits no distension. There is no tenderness.  Lymphadenopathy: no cervical adenopathy. No axillary adenopathy Neurological: alert and oriented to person, place, and time.  Skin: L leg wrapped post op   Psychiatric: a normal mood and affect.  behavior is normal.     Lab Results Recent Labs    03/08/17 1402 03/09/17 0327 03/10/17 0349  WBC 11.6*  --   --   HGB 11.9*  --   --   HCT 34.8*  --   --   NA  --  134* 137  K  --  4.2 3.4*  CL  --  100* 100*  CO2  --  25 28  BUN  --  14 11  CREATININE 0.61 0.70 0.73    Microbiology: Results for orders placed or performed during the hospital encounter of 03/08/17  Aerobic/Anaerobic Culture (surgical/deep wound)  Status: None (Preliminary result)   Collection Time: 03/08/17  9:34 AM  Result Value Ref Range Status   Specimen Description WOUND 1LEFT TIBIA FRACTURE  Final   Special Requests NONE  Final   Gram Stain   Final    RARE WBC PRESENT, PREDOMINANTLY PMN RARE GRAM POSITIVE COCCI    Culture   Final    FEW STAPHYLOCOCCUS AUREUS CULTURE REINCUBATED FOR BETTER GROWTH Performed at [facility], [place] [address] [place], [place] [address]    Report Status PENDING  Incomplete   Organism ID, Bacteria STAPHYLOCOCCUS AUREUS  Final      Susceptibility   Staphylococcus aureus - MIC*    CIPROFLOXACIN <=0.5 SENSITIVE Sensitive     ERYTHROMYCIN <=0.25 SENSITIVE  Sensitive     GENTAMICIN <=0.5 SENSITIVE Sensitive     OXACILLIN <=0.25 SENSITIVE Sensitive     TETRACYCLINE <=1 SENSITIVE Sensitive     VANCOMYCIN 1 SENSITIVE Sensitive     TRIMETH/SULFA <=10 SENSITIVE Sensitive     CLINDAMYCIN <=0.25 SENSITIVE Sensitive     RIFAMPIN <=0.5 SENSITIVE Sensitive     Inducible Clindamycin NEGATIVE Sensitive     * FEW STAPHYLOCOCCUS AUREUS  Aerobic/Anaerobic Culture (surgical/deep wound)     Status: None (Preliminary result)   Collection Time: 03/08/17  9:35 AM  Result Value Ref Range Status   Specimen Description WOUND 2 LEFT TIBIA FX  Final   Special Requests NONE  Final   Gram Stain   Final    RARE WBC PRESENT, PREDOMINANTLY PMN RARE GRAM POSITIVE COCCI    Culture   Final    RARE STAPHYLOCOCCUS AUREUS HOLDING FOR POSSIBLE ANAEROBE Performed at [facility], [place] [address] [place], [place] [address]    Report Status PENDING  Incomplete  Aerobic/Anaerobic Culture (surgical/deep wound)     Status: None (Preliminary result)   Collection Time: 03/08/17  9:36 AM  Result Value Ref Range Status   Specimen Description WOUND 3 LEFT TIBIA FX  Final   Special Requests NONE  Final   Gram Stain   Final    RARE WBC PRESENT, PREDOMINANTLY PMN NO ORGANISMS SEEN    Culture   Final    CULTURE REINCUBATED FOR BETTER GROWTH Performed at [facility], [place] [address], [place], [place] [address]    Report Status PENDING  Incomplete  Aerobic/Anaerobic Culture (surgical/deep wound)     Status: None (Preliminary result)   Collection Time: 03/08/17  9:37 AM  Result Value Ref Range Status   Specimen Description WOUND 1 LEFT MEDIAL ANKLE  Final   Special Requests NONE  Final   Gram Stain   Final    RARE WBC PRESENT, PREDOMINANTLY PMN NO ORGANISMS SEEN Performed at [facility], [place] [address], [place], [place] [address]    Culture RARE STAPHYLOCOCCUS AUREUS  Final   Report Status PENDING  Incomplete  Aerobic/Anaerobic Culture (surgical/deep  wound)     Status: None (Preliminary result)   Collection Time: 03/08/17  9:38 AM  Result Value Ref Range Status   Specimen Description WOUND 2 LEFT MEDIAL ANKLE  Final   Special Requests NONE  Final   Gram Stain   Final    NO WBC SEEN NO ORGANISMS SEEN Performed at [facility], [place] [address] [place], [place] [address]    Culture   Final    RARE STAPHYLOCOCCUS AUREUS NO ANAEROBES ISOLATED; CULTURE IN PROGRESS FOR 5 DAYS    Report Status PENDING  Incomplete  Aerobic/Anaerobic Culture (surgical/deep wound)     Status: None (Preliminary result)   Collection Time: 03/08/17  9:38 AM  Result Value Ref Range Status   Specimen Description WOUND 3LEFT MEDICAL ANKLE BONE  Final   Special Requests NONE  Final   Gram Stain   Final    RARE WBC PRESENT, PREDOMINANTLY PMN NO ORGANISMS SEEN    Culture   Final    FEW STAPHYLOCOCCUS AUREUS CRITICAL RESULT CALLED TO, READ BACK BY AND VERIFIED WITH: T. COBLE RN, AT 1406 03/09/17 BY D. VANHOOK REGARDING CULTURE GROWTH Performed at [facility], [place] [address], [place], [place] [address]    Report Status PENDING  Incomplete  Aerobic/Anaerobic Culture (surgical/deep wound)     Status: None (Preliminary result)   Collection Time: 03/08/17  9:38 AM  Result Value Ref Range Status   Specimen Description WOUND 1 INTERMEDULLARY CANAL  Final   Special Requests NONE  Final   Gram Stain   Final    MODERATE WBC PRESENT, PREDOMINANTLY PMN FEW GRAM POSITIVE COCCI    Culture   Final    FEW STAPHYLOCOCCUS AUREUS HOLDING FOR POSSIBLE ANAEROBE Performed at [facility], [place] [address] [place], [place] [address]    Report Status PENDING  Incomplete  Aerobic/Anaerobic Culture (surgical/deep wound)     Status: None (Preliminary result)   Collection Time: 03/08/17  9:38 AM  Result Value Ref Range Status   Specimen Description WOUND 2 INTERMEDULLARY CANAL  Final   Special Requests NONE  Final   Gram Stain   Final    RARE WBC PRESENT,  PREDOMINANTLY PMN RARE GRAM POSITIVE COCCI    Culture   Final    FEW STAPHYLOCOCCUS AUREUS HOLDING FOR POSSIBLE ANAEROBE Performed at [facility], [place] [address], [place], [place] [address]    Report Status PENDING  Incomplete   Organism ID, Bacteria STAPHYLOCOCCUS AUREUS  Final      Susceptibility   Staphylococcus aureus - MIC*    CIPROFLOXACIN <=0.5 SENSITIVE Sensitive     ERYTHROMYCIN <=0.25 SENSITIVE Sensitive     GENTAMICIN <=0.5 SENSITIVE Sensitive     OXACILLIN 0.5 SENSITIVE Sensitive     TETRACYCLINE <=1 SENSITIVE Sensitive     VANCOMYCIN 1 SENSITIVE Sensitive     TRIMETH/SULFA <=10 SENSITIVE Sensitive     CLINDAMYCIN <=0.25 SENSITIVE Sensitive     RIFAMPIN <=0.5 SENSITIVE Sensitive     Inducible Clindamycin NEGATIVE Sensitive     * FEW STAPHYLOCOCCUS AUREUS   Lab Results  Component Value Date   CRP 6.3 (H) 03/08/2017   Lab Results  Component Value Date   ESRSEDRATE 95 (H) 03/08/2017    Studies/Results: No results found.  Assessment/Plan: [name] Carrillo is a 51 y.o. female with L  leg wound over tibia and tibial osteomyelitis following IM nail placement 11/11 for tib fib fracture.  She had done well but then developed some redness at site and started bactrim 11/26 however infection worsened. She underwent 12/5 removal of hardware and has wound vac over a tibial skin defect with exposed bone. ESR 95, CRP 6.3. Cx  with Staph aureus -sensis pending.  The treatment will involve likely 4-6 weeks IV abx with close monitoring and possibly skin graft or flap.   Recommendations Cont vancomycin - see abx sheet If cx ends up being MSSA will change to IV ancef as otpt.   Thank you very much for the consult. Will follow with you.  Leonel Ramsay  03/10/2017, 1:34 PM

## 2017-03-10 NOTE — Progress Notes (Signed)
  Subjective: 2 Days Post-Op Procedure(s) (LRB): HARDWARE REMOVAL-LEFT LEG AND ANKLE (Left) APPLICATION OF WOUND VAC (Left) Patient reports pain as mild to moderate.   Patient seen in rounds with Dr. Allena KatzPatel. Patient is well, and has had no acute complaints or problems Plan is to go Home after hospital stay. Negative for chest pain and shortness of breath Fever: no Gastrointestinal: Negative for nausea and vomiting  Objective: Vital signs in last 24 hours: Temp:  [98.5 F (36.9 C)-98.6 F (37 C)] 98.5 F (36.9 C) (12/06 1955) Pulse Rate:  [74-82] 75 (12/06 1955) Resp:  [17-20] 17 (12/06 1955) BP: (103-124)/(50-64) 124/64 (12/06 1955) SpO2:  [97 %-99 %] 97 % (12/06 1955)  Intake/Output from previous day:  Intake/Output Summary (Last 24 hours) at 03/10/2017 0621 Last data filed at 03/10/2017 0349 Gross per 24 hour  Intake 1220 ml  Output -  Net 1220 ml    Intake/Output this shift: Total I/O In: 930 [P.O.:480; IV Piggyback:450] Out: -   Labs: Recent Labs    03/08/17 1402  HGB 11.9*   Recent Labs    03/08/17 1402  WBC 11.6*  RBC 3.26*  HCT 34.8*  PLT 353   Recent Labs    03/09/17 0327 03/10/17 0349  NA 134* 137  K 4.2 3.4*  CL 100* 100*  CO2 25 28  BUN 14 11  CREATININE 0.70 0.73  GLUCOSE 146* 114*  CALCIUM 9.3 8.8*   No results for input(s): LABPT, INR in the last 72 hours.   EXAM General - Patient is Alert and Oriented Extremity - the patient has improved sensation involving the medial aspect of the foot. The lateral and dorsal surface of the foot has good sensation. The patient is able to slightly flex the extensor hallucis longus and large toe. She has good flexion of the other digits. The splint and dressing are intact. Dressing/Incision - clean, dry, wound VAC is suctioning with the dressing intact   Past Medical History:  Diagnosis Date  . Anxiety   . Depression     Assessment/Plan: 2 Days Post-Op Procedure(s) (LRB): HARDWARE  REMOVAL-LEFT LEG AND ANKLE (Left) APPLICATION OF WOUND VAC (Left) Active Problems:   Tibia fracture  Estimated body mass index is 27.29 kg/m as calculated from the following:   Height as of this encounter: 5\' 5"  (1.651 m).   Weight as of this encounter: 74.4 kg (164 lb). Advance diet Up with therapy  Plan on discharge home today with PICC line. - NWB LLE - In splint that can be removed by unwrapping bias wrap. VAC change can then be performed as needed with re-application of splint and wrap.  - Maintain VAC.  - Infectious Diseases team consulted for assistance with Abx management. Discharge home on vancomycin. - Continue [employer] IV Abx until cultures result (Gram stain already growing gram pos cocci) - Home health will be needed for PICC, IV Abx, and wound VAC changes 3x/week.  - Dr. Allena KatzPatel Will discuss case with Plastic Surgery team for assistance with open wound - Lovenox for DVT ppx while inpatient. Can be discharged on ASA 325mg  bid x 4 weeks.    [name] Carrillo [name], PA-C Orthopaedic Surgery 03/10/2017, 6:21 AM

## 2017-03-10 NOTE — Progress Notes (Signed)
Pt Converted to home wound vac and ACE dressing applied. VSS. RN provided discharge teaching to patient and family. Pt verbalized understanding. Pt transferred in wheelchair to visitors entrance and assisted into family members car.

## 2017-03-10 NOTE — Progress Notes (Signed)
ANTIBIOTIC CONSULT NOTE - INITIAL  Pharmacy Consult for vancomycin Indication: osteomyelitis  No Known Allergies  Patient Measurements: Height: 5\' 5"  (165.1 cm) Weight: 164 lb (74.4 kg) IBW/kg (Calculated) : 57 Adjusted Body Weight:   Vital Signs: Temp: 98.5 F (36.9 C) (12/07 0721) Temp Source: Oral (12/07 0721) BP: 120/68 (12/07 0721) Pulse Rate: 74 (12/07 0721) Intake/Output from previous day: 12/06 0701 - 12/07 0700 In: 1340 [P.O.:840; IV Piggyback:500] Out: -  Intake/Output from this shift: No intake/output data recorded.  Labs: Recent Labs    03/08/17 1402 03/09/17 0327 03/10/17 0349  WBC 11.6*  --   --   HGB 11.9*  --   --   PLT 353  --   --   CREATININE 0.61 0.70 0.73   Estimated Creatinine Clearance: 83.1 mL/min (by C-G formula based on SCr of 0.73 mg/dL). Recent Labs    03/10/17 0820  VANCOTROUGH 12*     Microbiology: Recent Results (from the past 720 hour(s))  Aerobic/Anaerobic Culture (surgical/deep wound)     Status: None (Preliminary result)   Collection Time: 03/08/17  9:34 AM  Result Value Ref Range Status   Specimen Description WOUND 1LEFT TIBIA FRACTURE  Final   Special Requests NONE  Final   Gram Stain   Final    RARE WBC PRESENT, PREDOMINANTLY PMN RARE GRAM POSITIVE COCCI    Culture   Final    FEW STAPHYLOCOCCUS AUREUS SUSCEPTIBILITIES TO FOLLOW Performed at [facility], [address] [place], [place] [address]    Report Status PENDING  Incomplete  Aerobic/Anaerobic Culture (surgical/deep wound)     Status: None (Preliminary result)   Collection Time: 03/08/17  9:35 AM  Result Value Ref Range Status   Specimen Description WOUND 2 LEFT TIBIA FX  Final   Special Requests NONE  Final   Gram Stain   Final    RARE WBC PRESENT, PREDOMINANTLY PMN RARE GRAM POSITIVE COCCI Performed at [facility], [address] [place], [place] [address]    Culture RARE STAPHYLOCOCCUS AUREUS  Final   Report Status PENDING   Incomplete  Aerobic/Anaerobic Culture (surgical/deep wound)     Status: None (Preliminary result)   Collection Time: 03/08/17  9:36 AM  Result Value Ref Range Status   Specimen Description WOUND 3 LEFT TIBIA FX  Final   Special Requests NONE  Final   Gram Stain   Final    RARE WBC PRESENT, PREDOMINANTLY PMN NO ORGANISMS SEEN    Culture   Final    CULTURE REINCUBATED FOR BETTER GROWTH Performed at [facility], [address] [place], [place] [address]    Report Status PENDING  Incomplete  Aerobic/Anaerobic Culture (surgical/deep wound)     Status: None (Preliminary result)   Collection Time: 03/08/17  9:37 AM  Result Value Ref Range Status   Specimen Description WOUND 1 LEFT MEDIAL ANKLE  Final   Special Requests NONE  Final   Gram Stain   Final    RARE WBC PRESENT, PREDOMINANTLY PMN NO ORGANISMS SEEN Performed at [facility], [address] [place], [place] [address]    Culture RARE STAPHYLOCOCCUS AUREUS  Final   Report Status PENDING  Incomplete  Aerobic/Anaerobic Culture (surgical/deep wound)     Status: None (Preliminary result)   Collection Time: 03/08/17  9:38 AM  Result Value Ref Range Status   Specimen Description WOUND 2 LEFT MEDIAL ANKLE  Final   Special Requests NONE  Final  Gram Stain   Final    NO WBC SEEN NO ORGANISMS SEEN Performed at [facility], [address] [place], [place] [address]    Culture RARE STAPHYLOCOCCUS AUREUS  Final   Report Status PENDING  Incomplete  Aerobic/Anaerobic Culture (surgical/deep wound)     Status: None (Preliminary result)   Collection Time: 03/08/17  9:38 AM  Result Value Ref Range Status   Specimen Description WOUND 3LEFT MEDICAL ANKLE BONE  Final   Special Requests NONE  Final   Gram Stain   Final    RARE WBC PRESENT, PREDOMINANTLY PMN NO ORGANISMS SEEN    Culture   Final    FEW STAPHYLOCOCCUS AUREUS CRITICAL RESULT CALLED TO, READ BACK BY AND VERIFIED WITH: T. COBLE RN, AT 1406 03/09/17 BY D.  VANHOOK REGARDING CULTURE GROWTH Performed at [facility], [address] [place], [place] [address]    Report Status PENDING  Incomplete  Aerobic/Anaerobic Culture (surgical/deep wound)     Status: None (Preliminary result)   Collection Time: 03/08/17  9:38 AM  Result Value Ref Range Status   Specimen Description WOUND 1 INTERMEDULLARY CANAL  Final   Special Requests NONE  Final   Gram Stain   Final    MODERATE WBC PRESENT, PREDOMINANTLY PMN FEW GRAM POSITIVE COCCI Performed at [facility], [address] [place], [place] [address]    Culture FEW STAPHYLOCOCCUS AUREUS  Final   Report Status PENDING  Incomplete  Aerobic/Anaerobic Culture (surgical/deep wound)     Status: None (Preliminary result)   Collection Time: 03/08/17  9:38 AM  Result Value Ref Range Status   Specimen Description WOUND 2 INTERMEDULLARY CANAL  Final   Special Requests NONE  Final   Gram Stain   Final    RARE WBC PRESENT, PREDOMINANTLY PMN RARE GRAM POSITIVE COCCI    Culture   Final    FEW STAPHYLOCOCCUS AUREUS SUSCEPTIBILITIES TO FOLLOW Performed at [facility], [address] [place], [place] [address]    Report Status PENDING  Incomplete    Medical History: Past Medical History:  Diagnosis Date  . Anxiety   . Depression     Medications:  Infusions:  . methocarbamol (ROBAXIN)  IV    . piperacillin-tazobactam (ZOSYN)  IV 3.375 g (03/10/17 0625)  . vancomycin     Assessment: 51 yof with tibia fracture and drainage now sp IM nail. After procedure had increased drainage and pain, started oral Bactrim, however wound worsened and hardware had to be removed. ID diagnoses OM tibia, plan for 4 to 6 weeks of abx. Pharmacy consulted to dose vancomycin.  Goal of Therapy:  Vancomycin trough level 15-20 mcg/ml  Plan:  Vancomycin 1 gm IV x 1 today preop at 09:34. Will continue vancomycin 1 gm IV Q12H starting this evening at 20:00 (modified stacked dosing 08:00 and 20:00 for ease of  home administration). Pharmacy will continue to follow and adjust as needed to maintain trough 15 to 20 mcg/mL.   Vd 44.6 L, Ke 0.073 hr-1, T1/2 9.5 hr  03/10/17 0820 vancomycin trough 12 mcg/mL drawn approximately 1 hour late. Adjusted Cmin 13 mcg/mL. Recalculated Ke 0.084 hr-1, T1/2 8.3 hr. Increase to vancomycin 1.25 gm IV Q12H starting with this evening's dose. Predicted trough 16 mcg/mL. Pharmacy will continue to follow and adjust a sneeded to maintain trough 15 to 20 mcg/ml for OM.   Carola FrostNathan A Semisi Biela, Pharm.D., BCPS Clinical Pharmacist 03/10/2017,10:33 AM

## 2017-03-13 LAB — AEROBIC/ANAEROBIC CULTURE (SURGICAL/DEEP WOUND): GRAM STAIN: NONE SEEN

## 2017-03-13 LAB — AEROBIC/ANAEROBIC CULTURE W GRAM STAIN (SURGICAL/DEEP WOUND)

## 2017-03-14 LAB — AEROBIC/ANAEROBIC CULTURE (SURGICAL/DEEP WOUND)

## 2017-03-14 LAB — AEROBIC/ANAEROBIC CULTURE W GRAM STAIN (SURGICAL/DEEP WOUND)

## 2017-03-15 LAB — AEROBIC/ANAEROBIC CULTURE W GRAM STAIN (SURGICAL/DEEP WOUND)

## 2017-03-15 LAB — AEROBIC/ANAEROBIC CULTURE (SURGICAL/DEEP WOUND)

## 2017-03-29 ENCOUNTER — Encounter (HOSPITAL_BASED_OUTPATIENT_CLINIC_OR_DEPARTMENT_OTHER): Payer: Self-pay | Admitting: *Deleted

## 2017-03-31 ENCOUNTER — Ambulatory Visit: Payer: Self-pay | Admitting: Plastic Surgery

## 2017-03-31 DIAGNOSIS — S81802A Unspecified open wound, left lower leg, initial encounter: Secondary | ICD-10-CM

## 2017-04-03 ENCOUNTER — Encounter (HOSPITAL_BASED_OUTPATIENT_CLINIC_OR_DEPARTMENT_OTHER): Payer: Self-pay | Admitting: Anesthesiology

## 2017-04-03 ENCOUNTER — Ambulatory Visit (HOSPITAL_BASED_OUTPATIENT_CLINIC_OR_DEPARTMENT_OTHER): Payer: 59 | Admitting: Anesthesiology

## 2017-04-03 ENCOUNTER — Encounter (HOSPITAL_BASED_OUTPATIENT_CLINIC_OR_DEPARTMENT_OTHER)
Admission: RE | Disposition: A | Discharge status: Home / Self Care | Payer: Self-pay | Source: Ambulatory Visit | Attending: Plastic Surgery

## 2017-04-03 ENCOUNTER — Ambulatory Visit (HOSPITAL_BASED_OUTPATIENT_CLINIC_OR_DEPARTMENT_OTHER)
Admission: RE | Admit: 2017-04-03 | Discharge: 2017-04-03 | Disposition: A | Discharge status: Home / Self Care | Payer: 59 | Source: Ambulatory Visit | Attending: Plastic Surgery | Admitting: Plastic Surgery

## 2017-04-03 ENCOUNTER — Other Ambulatory Visit: Payer: Self-pay

## 2017-04-03 DIAGNOSIS — F419 Anxiety disorder, unspecified: Secondary | ICD-10-CM | POA: Diagnosis not present

## 2017-04-03 DIAGNOSIS — Z79899 Other long term (current) drug therapy: Secondary | ICD-10-CM | POA: Insufficient documentation

## 2017-04-03 DIAGNOSIS — Z87891 Personal history of nicotine dependence: Secondary | ICD-10-CM | POA: Diagnosis not present

## 2017-04-03 DIAGNOSIS — X58XXXA Exposure to other specified factors, initial encounter: Secondary | ICD-10-CM | POA: Diagnosis not present

## 2017-04-03 DIAGNOSIS — F329 Major depressive disorder, single episode, unspecified: Secondary | ICD-10-CM | POA: Diagnosis not present

## 2017-04-03 DIAGNOSIS — S81802A Unspecified open wound, left lower leg, initial encounter: Secondary | ICD-10-CM | POA: Diagnosis not present

## 2017-04-03 HISTORY — PX: DEBRIDEMENT AND CLOSURE WOUND: SHX5614

## 2017-04-03 SURGERY — DEBRIDEMENT, WOUND, WITH CLOSURE
Anesthesia: General | Site: Leg Lower | Laterality: Left

## 2017-04-03 MED ORDER — LACTATED RINGERS IV SOLN
INTRAVENOUS | Status: DC
  Administered 2017-04-03 (×2): via INTRAVENOUS

## 2017-04-03 MED ORDER — MIDAZOLAM HCL 2 MG/2ML IJ SOLN
1.0000 mg | INTRAMUSCULAR | Status: DC | PRN
  Administered 2017-04-03: 2 mg via INTRAVENOUS

## 2017-04-03 MED ORDER — HYDROMORPHONE HCL 1 MG/ML IJ SOLN
0.2500 mg | INTRAMUSCULAR | Status: DC | PRN
  Administered 2017-04-03: 0.5 mg via INTRAVENOUS

## 2017-04-03 MED ORDER — LIDOCAINE 2% (20 MG/ML) 5 ML SYRINGE
INTRAMUSCULAR | Status: AC
Start: 2017-04-03 — End: ?
  Filled 2017-04-03: qty 5

## 2017-04-03 MED ORDER — CEFAZOLIN SODIUM-DEXTROSE 2-4 GM/100ML-% IV SOLN: 2.0000 g | INTRAVENOUS | Status: DC

## 2017-04-03 MED ORDER — PROPOFOL 10 MG/ML IV BOLUS
INTRAVENOUS | Status: DC | PRN
  Administered 2017-04-03: 200 mg via INTRAVENOUS

## 2017-04-03 MED ORDER — HYDROMORPHONE HCL 1 MG/ML IJ SOLN
INTRAMUSCULAR | Status: AC
  Filled 2017-04-03: qty 0.5

## 2017-04-03 MED ORDER — ONDANSETRON HCL 4 MG/2ML IJ SOLN
INTRAMUSCULAR | Status: DC | PRN
  Administered 2017-04-03: 4 mg via INTRAVENOUS

## 2017-04-03 MED ORDER — DEXAMETHASONE SODIUM PHOSPHATE 4 MG/ML IJ SOLN
INTRAMUSCULAR | Status: DC | PRN
  Administered 2017-04-03: 10 mg via INTRAVENOUS

## 2017-04-03 MED ORDER — FENTANYL CITRATE (PF) 100 MCG/2ML IJ SOLN
50.0000 ug | INTRAMUSCULAR | Status: DC | PRN
  Administered 2017-04-03: 100 ug via INTRAVENOUS

## 2017-04-03 MED ORDER — OXYCODONE HCL 5 MG PO TABS: 5.0000 mg | ORAL_TABLET | Freq: Once | ORAL | Status: DC | PRN

## 2017-04-03 MED ORDER — MIDAZOLAM HCL 2 MG/2ML IJ SOLN
INTRAMUSCULAR | Status: AC
  Filled 2017-04-03: qty 2

## 2017-04-03 MED ORDER — FENTANYL CITRATE (PF) 100 MCG/2ML IJ SOLN
INTRAMUSCULAR | Status: AC
Start: 2017-04-03 — End: ?
  Filled 2017-04-03: qty 2

## 2017-04-03 MED ORDER — LIDOCAINE 2% (20 MG/ML) 5 ML SYRINGE
INTRAMUSCULAR | Status: DC | PRN
  Administered 2017-04-03: 60 mg via INTRAVENOUS

## 2017-04-03 MED ORDER — OXYCODONE HCL 5 MG/5ML PO SOLN: 5.0000 mg | Freq: Once | ORAL | Status: DC | PRN

## 2017-04-03 MED ORDER — PROMETHAZINE HCL 25 MG/ML IJ SOLN: 6.2500 mg | INTRAMUSCULAR | Status: DC | PRN

## 2017-04-03 MED ORDER — SCOPOLAMINE 1 MG/3DAYS TD PT72: 1.0000 | MEDICATED_PATCH | Freq: Once | TRANSDERMAL | Status: DC | PRN

## 2017-04-03 MED ORDER — MEPERIDINE HCL 25 MG/ML IJ SOLN: 6.2500 mg | INTRAMUSCULAR | Status: DC | PRN

## 2017-04-03 MED ORDER — SODIUM CHLORIDE 0.9 % IV SOLN
INTRAVENOUS | Status: DC | PRN
  Administered 2017-04-03: 500 mL

## 2017-04-03 MED ORDER — KETOROLAC TROMETHAMINE 30 MG/ML IJ SOLN: 30.0000 mg | Freq: Once | INTRAMUSCULAR | Status: DC | PRN

## 2017-04-03 SURGICAL SUPPLY — 31 items
BAG DECANTER FOR FLEXI CONT (MISCELLANEOUS) ×3 IMPLANT
BANDAGE ACE 4X5 VEL STRL LF (GAUZE/BANDAGES/DRESSINGS) ×4 IMPLANT
BLADE HEX COATED 2.75 (ELECTRODE) ×3 IMPLANT
BLADE SURG 10 STRL SS (BLADE) ×2 IMPLANT
BLADE SURG 15 STRL LF DISP TIS (BLADE) ×2 IMPLANT
BLADE SURG 15 STRL SS (BLADE) ×3
BNDG GAUZE ELAST 4 BULKY (GAUZE/BANDAGES/DRESSINGS) ×5 IMPLANT
BUR PEAR (BURR) ×2 IMPLANT
CANISTER SUCT 1200ML W/VALVE (MISCELLANEOUS) ×4 IMPLANT
COVER BACK TABLE 60X90IN (DRAPES) ×3 IMPLANT
DRAPE U-SHAPE 76X120 STRL (DRAPES) ×3 IMPLANT
DRESSING DUODERM 4X4 STERILE (GAUZE/BANDAGES/DRESSINGS) ×2 IMPLANT
DRSG EMULSION OIL 3X3 NADH (GAUZE/BANDAGES/DRESSINGS) ×2 IMPLANT
DRSG PAD ABDOMINAL 8X10 ST (GAUZE/BANDAGES/DRESSINGS) ×2 IMPLANT
ELECT REM PT RETURN 9FT ADLT (ELECTROSURGICAL) ×3
ELECTRODE REM PT RTRN 9FT ADLT (ELECTROSURGICAL) ×2 IMPLANT
GLOVE BIO SURGEON STRL SZ 6.5 (GLOVE) ×6 IMPLANT
GOWN STRL REUS W/ TWL LRG LVL3 (GOWN DISPOSABLE) ×4 IMPLANT
GOWN STRL REUS W/TWL LRG LVL3 (GOWN DISPOSABLE) ×6
NDL HYPO 25X1 1.5 SAFETY (NEEDLE) ×1 IMPLANT
NEEDLE HYPO 25X1 1.5 SAFETY (NEEDLE) ×3 IMPLANT
NS IRRIG 1000ML POUR BTL (IV SOLUTION) ×3 IMPLANT
PACK BASIN DAY SURGERY FS (CUSTOM PROCEDURE TRAY) ×3 IMPLANT
PENCIL BUTTON HOLSTER BLD 10FT (ELECTRODE) ×3 IMPLANT
SUT VIC AB 5-0 PS2 18 (SUTURE) ×3 IMPLANT
SYR BULB IRRIGATION 50ML (SYRINGE) ×3 IMPLANT
TOWEL OR 17X24 6PK STRL BLUE (TOWEL DISPOSABLE) ×6 IMPLANT
TRAY DSU PREP LF (CUSTOM PROCEDURE TRAY) ×3 IMPLANT
TUBE CONNECTING 20X1/4 (TUBING) ×3 IMPLANT
UNDERPAD 30X30 (UNDERPADS AND DIAPERS) ×3 IMPLANT
YANKAUER SUCT BULB TIP NO VENT (SUCTIONS) ×3 IMPLANT

## 2017-04-03 NOTE — Anesthesia Procedure Notes (Signed)
Procedure Name: LMA Insertion Date/Time: 04/03/2017 2:24 PM Performed by: Caren Macadamarter, Christoph Copelan W, CRNA Pre-anesthesia Checklist: Patient identified, Emergency Drugs available, Suction available and Patient being monitored Patient Re-evaluated:Patient Re-evaluated prior to induction Oxygen Delivery Method: Circle system utilized Preoxygenation: Pre-oxygenation with 100% oxygen Induction Type: IV induction Ventilation: Mask ventilation without difficulty LMA: LMA inserted LMA Size: 4.0 Number of attempts: 1 Airway Equipment and Method: Bite block Placement Confirmation: positive ETCO2 and breath sounds checked- equal and bilateral Tube secured with: Tape Dental Injury: Teeth and Oropharynx as per pre-operative assessment

## 2017-04-03 NOTE — Anesthesia Postprocedure Evaluation (Addendum)
Anesthesia Post Note  Patient: [name] Carrillo  Procedure(s) Performed: DEBRIDEMENT AND CLOSURE WOUND (Left Leg Lower)     Patient location during evaluation: PACU Anesthesia Type: General Level of consciousness: awake and alert Pain management: pain level controlled Vital Signs Assessment: post-procedure vital signs reviewed and stable Respiratory status: spontaneous breathing, nonlabored ventilation, respiratory function stable and patient connected to nasal cannula oxygen Cardiovascular status: blood pressure returned to baseline and stable Postop Assessment: no apparent nausea or vomiting Anesthetic complications: no    Last Vitals:  Vitals:   04/03/17 1530 04/03/17 1542  BP: 123/63   Pulse: 71 77  Resp: 16 20  Temp:    SpO2: 95% 96%                  Shelton SilvasKevin D Hollis

## 2017-04-03 NOTE — H&P (Signed)
[name] Carrillo is an 51 y.o. female.   Chief Complaint: left leg wound HPI: The patient is a 51 y.o. yrs old wf here for evaluation of her left leg wound.  She fell and fractured her left tibia and fibula ~ one month ago.  She underwent intramedullary nailing and sustained a subsequent infection with exchange of the nail and debridement.  She now has a 3 x 4 cm area of bone exposed on the distal third of the left leg anteriorly.  A soleus flap might reach but would be difficult.  The surrounding skin is intact and seems to be healing well.  There does not appear to be any purulence in the area.  There is mild swelling as expected with the fracture.  She has quit smoking.  No other concerning areas of note.   Past Medical History:  Diagnosis Date  . Anxiety   . Depression     Past Surgical History:  Procedure Laterality Date  . APPLICATION OF WOUND VAC Left 03/08/2017   Procedure: APPLICATION OF WOUND VAC;  Surgeon: Signa KellPatel, Sunny, MD;  Location: [facility] ORS;  Service: Orthopedics;  Laterality: Left;  . HARDWARE REMOVAL Left 03/08/2017   Procedure: HARDWARE REMOVAL-LEFT LEG AND ANKLE;  Surgeon: Signa KellPatel, Sunny, MD;  Location: [facility] ORS;  Service: Orthopedics;  Laterality: Left;  . right leg surgery Right 2014   .IM nailing right leg. screws and rod inserted.  . TIBIA IM NAIL INSERTION Left 02/12/2017   Procedure: INTRAMEDULLARY (IM) NAIL TIBIAL;  Surgeon: Signa KellPatel, Sunny, MD;  Location: [facility] ORS;  Service: Orthopedics;  Laterality: Left;  . TUBAL LIGATION  11/2002    History reviewed. No pertinent family history. Social History:  reports that she quit smoking about 10 months ago. Her smoking use included cigarettes. She smoked 0.50 packs per day. she has never used smokeless tobacco. She reports that she drinks alcohol. She reports that she does not use drugs.  Allergies: No Known Allergies  No medications prior to admission.    No results found for this or any previous visit (from the past 48  hour(s)). No results found.  Review of Systems  Constitutional: Negative.   HENT: Negative.   Eyes: Negative.   Respiratory: Negative.   Cardiovascular: Negative.   Gastrointestinal: Negative.   Genitourinary: Negative.   Musculoskeletal: Negative.   Skin: Negative.   Neurological: Negative.   Psychiatric/Behavioral: Negative.     Height 5\' 5"  (1.651 m), weight 74.4 kg (164 lb), last menstrual period 09/07/2015. Physical Exam  Constitutional: She is oriented to person, place, and time. She appears well-developed and well-nourished.  HENT:  Head: Normocephalic and atraumatic.  Eyes: EOM are normal. Pupils are equal, round, and reactive to light.  Cardiovascular: Normal rate.  Respiratory: Effort normal.  GI: Soft. She exhibits no distension.  Neurological: She is alert and oriented to person, place, and time.  Skin: Skin is warm.  Psychiatric: She has a normal mood and affect. Her behavior is normal. Judgment and thought content normal.     Assessment/Plan Left leg debridement with VAC placement.  [name] BillsClaire S Iktan Aikman, DO 04/03/2017, 7:36 AM

## 2017-04-03 NOTE — Discharge Instructions (Signed)
Do not change dressing.   Follow up at Dr. Ulice Boldillingham office at 4 pm.    Post Anesthesia Home Care Instructions  Activity: Get plenty of rest for the remainder of the day. A responsible individual must stay with you for 24 hours following the procedure.  For the next 24 hours, DO NOT: -Drive a car -Advertising copywriterperate machinery -Drink alcoholic beverages -Take any medication unless instructed by your physician -Make any legal decisions or sign important papers.  Meals: Start with liquid foods such as gelatin or soup. Progress to regular foods as tolerated. Avoid greasy, spicy, heavy foods. If nausea and/or vomiting occur, drink only clear liquids until the nausea and/or vomiting subsides. Call your physician if vomiting continues.  Special Instructions/Symptoms: Your throat may feel dry or sore from the anesthesia or the breathing tube placed in your throat during surgery. If this causes discomfort, gargle with warm salt water. The discomfort should disappear within 24 hours.  If you had a scopolamine patch placed behind your ear for the management of post- operative nausea and/or vomiting:  1. The medication in the patch is effective for 72 hours, after which it should be removed.  Wrap patch in a tissue and discard in the trash. Wash hands thoroughly with soap and water. 2. You may remove the patch earlier than 72 hours if you experience unpleasant side effects which may include dry mouth, dizziness or visual disturbances. 3. Avoid touching the patch. Wash your hands with soap and water after contact with the patch.

## 2017-04-03 NOTE — Anesthesia Preprocedure Evaluation (Addendum)
Anesthesia Evaluation  Patient identified by MRN, date of birth, ID band Patient awake    Reviewed: Allergy & Precautions, NPO status , Patient's Chart, lab work & pertinent test results  History of Anesthesia Complications Negative for: history of anesthetic complications  Airway Mallampati: III  TM Distance: >3 FB Neck ROM: Full    Dental  (+) Poor Dentition,    Pulmonary neg sleep apnea, neg COPD, former smoker,    breath sounds clear to auscultation- rhonchi (-) wheezing      Cardiovascular Exercise Tolerance: Good (-) hypertension(-) CAD, (-) Past MI and (-) Cardiac Stents  Rhythm:Regular Rate:Normal - Systolic murmurs and - Diastolic murmurs    Neuro/Psych PSYCHIATRIC DISORDERS Anxiety Depression negative neurological ROS     GI/Hepatic negative GI ROS, Neg liver ROS,   Endo/Other  negative endocrine ROSneg diabetes  Renal/GU negative Renal ROS     Musculoskeletal negative musculoskeletal ROS (+)   Abdominal (+) - obese,   Peds  Hematology   Anesthesia Other Findings Past Medical History: No date: Anxiety No date: Depression   Reproductive/Obstetrics                             Anesthesia Physical  Anesthesia Plan  ASA: II  Anesthesia Plan: General   Post-op Pain Management:    Induction: Intravenous  PONV Risk Score and Plan: 2 and Ondansetron and Dexamethasone  Airway Management Planned: LMA  Additional Equipment:   Intra-op Plan:   Post-operative Plan: Extubation in OR  Informed Consent: I have reviewed the patients History and Physical, chart, labs and discussed the procedure including the risks, benefits and alternatives for the proposed anesthesia with the patient or authorized representative who has indicated his/her understanding and acceptance.   Dental advisory given  Plan Discussed with: CRNA  Anesthesia Plan Comments:         Anesthesia Quick  Evaluation

## 2017-04-03 NOTE — Transfer of Care (Signed)
Immediate Anesthesia Transfer of Care Note  Patient: [name] Carrillo  Procedure(s) Performed: DEBRIDEMENT AND CLOSURE WOUND (Left Leg Lower)  Patient Location: PACU  Anesthesia Type:General  Level of Consciousness: awake and alert   Airway & Oxygen Therapy: Patient Spontanous Breathing and Patient connected to face mask oxygen  Post-op Assessment: Report given to RN and Post -op Vital signs reviewed and stable  Post vital signs: Reviewed and stable  Last Vitals:  Vitals:   04/03/17 1313  BP: 128/87  Pulse: 88  Resp: 20  Temp: 36.8 C  SpO2: 100%    Last Pain:  Vitals:   04/03/17 1313  TempSrc: Oral         Complications: No apparent anesthesia complications

## 2017-04-03 NOTE — Op Note (Signed)
DATE OF OPERATION: 04/03/2017  [facility]  PREOPERATIVE DIAGNOSIS: Left leg wound   POSTOPERATIVE DIAGNOSIS: Same  PROCEDURE: Excision of left leg wound skin, fat and bone 3 x 4 x 2 cm  SURGEON: Ronte Parker Sanger Andrya Roppolo, DO  EBL: 10 cc  CONDITION: Stable  COMPLICATIONS: None  INDICATION: The patient, [name] Carrillo, is a 51 y.o. female born on 02/20/1966, is here for treatment of an open fracture of the left leg.   PROCEDURE DETAILS:  The patient was seen prior to surgery and marked.  The IV antibiotics were given. The patient was taken to the operating room and given a general anesthetic. A standard time out was performed and all information was confirmed by those in the room. SCD was placed on the right leg.   The wound of the left leg was irrigated with saline and antibiotic solution.  The #15 blade was used to excise the skin and fat that was nonviable at the 3 x 4 cm wound.  The burr was then used to excise the bone to get some punctate bleeding.  An adaptic was applied on top with the gauze over the adaptic.  The leg was wrapped with kerlex and an ace wrap.  The patient was allowed to wake up and taken to recovery room in stable condition at the end of the case. The family was notified at the end of the case.

## 2017-04-05 ENCOUNTER — Encounter (HOSPITAL_BASED_OUTPATIENT_CLINIC_OR_DEPARTMENT_OTHER): Payer: Self-pay | Admitting: Plastic Surgery

## 2017-04-06 ENCOUNTER — Ambulatory Visit
Admission: RE | Admit: 2017-04-06 | Discharge: 2017-04-06 | Disposition: A | Discharge status: Home / Self Care | Payer: 59 | Source: Ambulatory Visit | Attending: Infectious Diseases | Admitting: Infectious Diseases

## 2017-04-06 DIAGNOSIS — M86162 Other acute osteomyelitis, left tibia and fibula: Secondary | ICD-10-CM | POA: Insufficient documentation

## 2017-04-06 NOTE — OR Nursing (Signed)
Vascular access nurse was unable to place PICC line today. Dr. Demetrius CharityP. Fitzgerald's office called and message left with Arva ChafeBrandy Goldston about the above. Pt. Reports she is taking her po antibiotics and has an appointment with Dr. Sampson GoonFitzgerald in the am.

## 2017-04-06 NOTE — Progress Notes (Signed)
Attempted PICC line placement to continue home antibiotics . Accessed right basilic vein on first attempt without difficulty however I was unable to get catheter to pass the clavicle area . Catheter would coil and would  not advance. Recommend that patient goes to IR and place a catheter under floro if PICC line is still needed .

## 2019-06-20 ENCOUNTER — Ambulatory Visit: Payer: Self-pay | Attending: Family

## 2019-06-20 DIAGNOSIS — Z23 Encounter for immunization: Secondary | ICD-10-CM

## 2019-06-20 NOTE — Progress Notes (Signed)
   Covid-19 Vaccination Clinic  Name:  [name] Carrillo    MRN: [id number] DOB: September 27, 1965  06/20/2019  Ms. Connolly was observed post Covid-19 immunization for 15 minutes without incident. She was provided with Vaccine Information Sheet and instruction to access the V-Safe system.   Ms. Komatsu was instructed to call 911 with any severe reactions post vaccine: Marland Kitchen Difficulty breathing  . Swelling of face and throat  . A fast heartbeat  . A bad rash all over body  . Dizziness and weakness   Immunizations Administered    Name Date Dose VIS Date Route   Moderna COVID-19 Vaccine 06/20/2019 12:12 PM 0.5 mL 03/05/2019 Intramuscular   Manufacturer: Moderna   Lot: [id number]   NDC: 82505-397-67

## 2019-07-23 ENCOUNTER — Ambulatory Visit: Payer: Self-pay | Attending: Family

## 2019-07-23 DIAGNOSIS — Z23 Encounter for immunization: Secondary | ICD-10-CM

## 2019-07-23 NOTE — Progress Notes (Signed)
   Covid-19 Vaccination Clinic  Name:  ELIZE PINON    MRN: [id number] DOB: 02-May-1965  07/23/2019  Ms. Bergquist was observed post Covid-19 immunization for 15 minutes without incident. She was provided with Vaccine Information Sheet and instruction to access the V-Safe system.   Ms. Ognibene was instructed to call 911 with any severe reactions post vaccine: Marland Kitchen Difficulty breathing  . Swelling of face and throat  . A fast heartbeat  . A bad rash all over body  . Dizziness and weakness   Immunizations Administered    Name Date Dose VIS Date Route   Moderna COVID-19 Vaccine 07/23/2019 10:05 AM 0.5 mL 03/2019 Intramuscular   Manufacturer: Moderna   Lot: [id number]   NDC: 40992-780-04

## 2021-01-19 ENCOUNTER — Emergency Department: Payer: BC Managed Care – PPO

## 2021-01-19 ENCOUNTER — Other Ambulatory Visit: Payer: Self-pay

## 2021-01-19 DIAGNOSIS — K7031 Alcoholic cirrhosis of liver with ascites: Secondary | ICD-10-CM | POA: Diagnosis not present

## 2021-01-19 DIAGNOSIS — D529 Folate deficiency anemia, unspecified: Secondary | ICD-10-CM | POA: Diagnosis not present

## 2021-01-19 DIAGNOSIS — D684 Acquired coagulation factor deficiency: Secondary | ICD-10-CM | POA: Diagnosis present

## 2021-01-19 DIAGNOSIS — B9620 Unspecified Escherichia coli [E. coli] as the cause of diseases classified elsewhere: Secondary | ICD-10-CM | POA: Diagnosis present

## 2021-01-19 DIAGNOSIS — R4182 Altered mental status, unspecified: Secondary | ICD-10-CM | POA: Diagnosis not present

## 2021-01-19 DIAGNOSIS — D6959 Other secondary thrombocytopenia: Secondary | ICD-10-CM | POA: Diagnosis present

## 2021-01-19 DIAGNOSIS — E43 Unspecified severe protein-calorie malnutrition: Secondary | ICD-10-CM | POA: Diagnosis present

## 2021-01-19 DIAGNOSIS — N390 Urinary tract infection, site not specified: Secondary | ICD-10-CM | POA: Diagnosis present

## 2021-01-19 DIAGNOSIS — F419 Anxiety disorder, unspecified: Secondary | ICD-10-CM | POA: Diagnosis present

## 2021-01-19 DIAGNOSIS — E870 Hyperosmolality and hypernatremia: Secondary | ICD-10-CM | POA: Diagnosis not present

## 2021-01-19 DIAGNOSIS — Z9911 Dependence on respirator [ventilator] status: Secondary | ICD-10-CM

## 2021-01-19 DIAGNOSIS — D539 Nutritional anemia, unspecified: Secondary | ICD-10-CM | POA: Diagnosis present

## 2021-01-19 DIAGNOSIS — G928 Other toxic encephalopathy: Secondary | ICD-10-CM | POA: Diagnosis not present

## 2021-01-19 DIAGNOSIS — E876 Hypokalemia: Secondary | ICD-10-CM | POA: Diagnosis not present

## 2021-01-19 DIAGNOSIS — R579 Shock, unspecified: Secondary | ICD-10-CM | POA: Diagnosis not present

## 2021-01-19 DIAGNOSIS — K766 Portal hypertension: Secondary | ICD-10-CM | POA: Diagnosis present

## 2021-01-19 DIAGNOSIS — F10231 Alcohol dependence with withdrawal delirium: Secondary | ICD-10-CM | POA: Diagnosis not present

## 2021-01-19 DIAGNOSIS — F05 Delirium due to known physiological condition: Secondary | ICD-10-CM | POA: Diagnosis not present

## 2021-01-19 DIAGNOSIS — K7682 Hepatic encephalopathy: Secondary | ICD-10-CM | POA: Diagnosis present

## 2021-01-19 DIAGNOSIS — E860 Dehydration: Secondary | ICD-10-CM | POA: Diagnosis not present

## 2021-01-19 DIAGNOSIS — I6329 Cerebral infarction due to unspecified occlusion or stenosis of other precerebral arteries: Secondary | ICD-10-CM | POA: Diagnosis present

## 2021-01-19 DIAGNOSIS — N179 Acute kidney failure, unspecified: Secondary | ICD-10-CM | POA: Diagnosis not present

## 2021-01-19 DIAGNOSIS — E871 Hypo-osmolality and hyponatremia: Secondary | ICD-10-CM | POA: Diagnosis present

## 2021-01-19 DIAGNOSIS — I615 Nontraumatic intracerebral hemorrhage, intraventricular: Secondary | ICD-10-CM | POA: Diagnosis not present

## 2021-01-19 DIAGNOSIS — J690 Pneumonitis due to inhalation of food and vomit: Secondary | ICD-10-CM | POA: Diagnosis not present

## 2021-01-19 DIAGNOSIS — Z6829 Body mass index (BMI) 29.0-29.9, adult: Secondary | ICD-10-CM

## 2021-01-19 DIAGNOSIS — J9602 Acute respiratory failure with hypercapnia: Secondary | ICD-10-CM | POA: Diagnosis not present

## 2021-01-19 DIAGNOSIS — F32A Depression, unspecified: Secondary | ICD-10-CM | POA: Diagnosis present

## 2021-01-19 DIAGNOSIS — B961 Klebsiella pneumoniae [K. pneumoniae] as the cause of diseases classified elsewhere: Secondary | ICD-10-CM | POA: Diagnosis present

## 2021-01-19 DIAGNOSIS — K7011 Alcoholic hepatitis with ascites: Secondary | ICD-10-CM | POA: Diagnosis present

## 2021-01-19 DIAGNOSIS — Z87891 Personal history of nicotine dependence: Secondary | ICD-10-CM

## 2021-01-19 DIAGNOSIS — I609 Nontraumatic subarachnoid hemorrhage, unspecified: Secondary | ICD-10-CM | POA: Diagnosis not present

## 2021-01-19 DIAGNOSIS — I671 Cerebral aneurysm, nonruptured: Secondary | ICD-10-CM | POA: Diagnosis present

## 2021-01-19 DIAGNOSIS — J9601 Acute respiratory failure with hypoxia: Secondary | ICD-10-CM | POA: Diagnosis not present

## 2021-01-19 DIAGNOSIS — Z20822 Contact with and (suspected) exposure to covid-19: Secondary | ICD-10-CM | POA: Diagnosis present

## 2021-01-19 LAB — CBC
HCT: 23.9 % — ABNORMAL LOW (ref 36.0–46.0)
Hemoglobin: 8.4 g/dL — ABNORMAL LOW (ref 12.0–15.0)
MCH: 44.7 pg — ABNORMAL HIGH (ref 26.0–34.0)
MCHC: 35.1 g/dL (ref 30.0–36.0)
MCV: 127.1 fL — ABNORMAL HIGH (ref 80.0–100.0)
Platelets: 150 10*3/uL (ref 150–400)
RBC: 1.88 MIL/uL — ABNORMAL LOW (ref 3.87–5.11)
RDW: 19.6 % — ABNORMAL HIGH (ref 11.5–15.5)
WBC: 12.9 10*3/uL — ABNORMAL HIGH (ref 4.0–10.5)
nRBC: 0.4 % — ABNORMAL HIGH (ref 0.0–0.2)

## 2021-01-19 LAB — COMPREHENSIVE METABOLIC PANEL
ALT: 91 U/L — ABNORMAL HIGH (ref 0–44)
AST: 371 U/L — ABNORMAL HIGH (ref 15–41)
Albumin: 2.6 g/dL — ABNORMAL LOW (ref 3.5–5.0)
Alkaline Phosphatase: 195 U/L — ABNORMAL HIGH (ref 38–126)
Anion gap: 15 (ref 5–15)
BUN: 26 mg/dL — ABNORMAL HIGH (ref 6–20)
CO2: 28 mmol/L (ref 22–32)
Calcium: 9.9 mg/dL (ref 8.9–10.3)
Chloride: 81 mmol/L — ABNORMAL LOW (ref 98–111)
Creatinine, Ser: 0.75 mg/dL (ref 0.44–1.00)
GFR, Estimated: >60 mL/min (ref >60)
Glucose, Bld: 116 mg/dL — ABNORMAL HIGH (ref 70–99)
Potassium: 3.9 mmol/L (ref 3.5–5.1)
Sodium: 124 mmol/L — ABNORMAL LOW (ref 135–145)
Total Bilirubin: 16 mg/dL — ABNORMAL HIGH (ref 0.3–1.2)
Total Protein: 7.6 g/dL (ref 6.5–8.1)

## 2021-01-19 LAB — TROPONIN I (HIGH SENSITIVITY): Troponin I (High Sensitivity): 7 ng/L (ref <18)

## 2021-01-19 LAB — LIPASE, BLOOD: Lipase: 54 U/L — ABNORMAL HIGH (ref 11–51)

## 2021-01-19 LAB — ACETAMINOPHEN LEVEL: Acetaminophen (Tylenol), Serum: <10 ug/mL — ABNORMAL LOW (ref 10–30)

## 2021-01-19 LAB — AMMONIA: Ammonia: 35 umol/L (ref 9–35)

## 2021-01-19 LAB — SALICYLATE LEVEL: Salicylate Lvl: <7 mg/dL — ABNORMAL LOW (ref 7.0–30.0)

## 2021-01-19 IMAGING — US US ABDOMEN LIMITED
1 series · 13 of 25 positions shown · non-contrast
Comparison: None.
COMPARISON: None.

Addendum:
CLINICAL DATA: Jaundice

EXAM:
ULTRASOUND ABDOMEN LIMITED RIGHT UPPER QUADRANT

[Series 1: us abdomen limited ruq (liver/gb) · 13 of 53 slices shown]
[im 1/53]
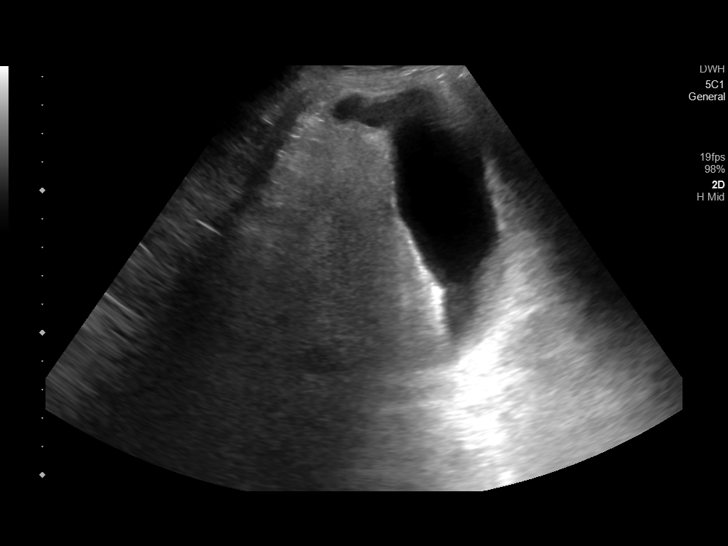
[im 5/53]
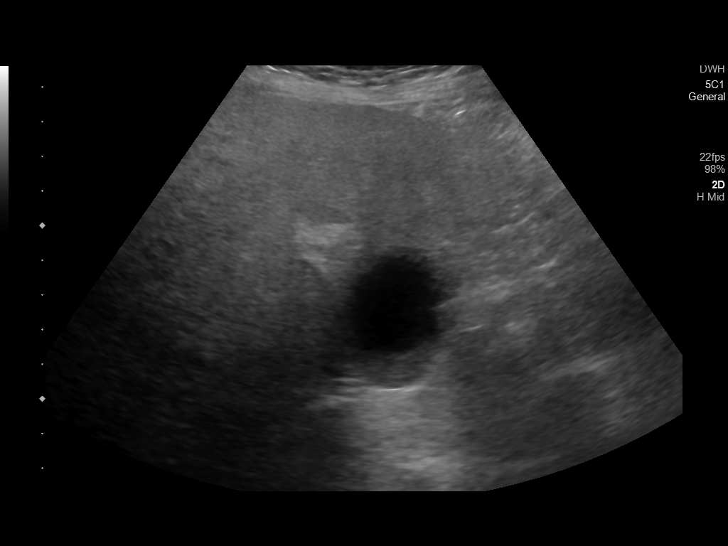
[im 9/53]
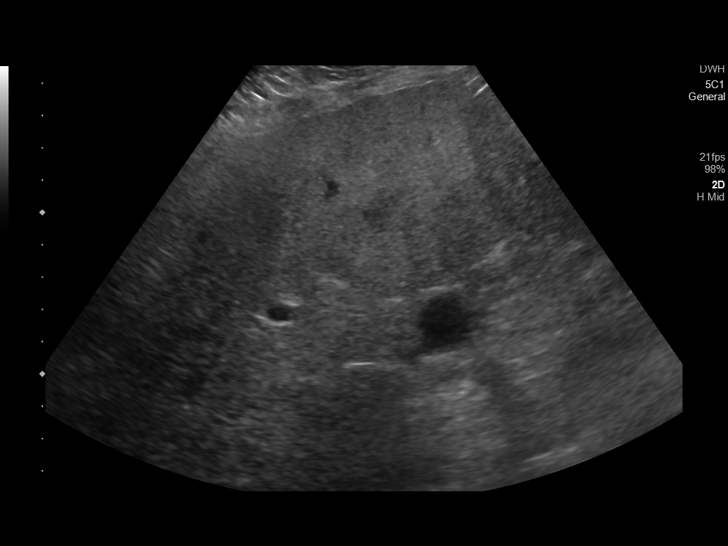
[im 14/53]
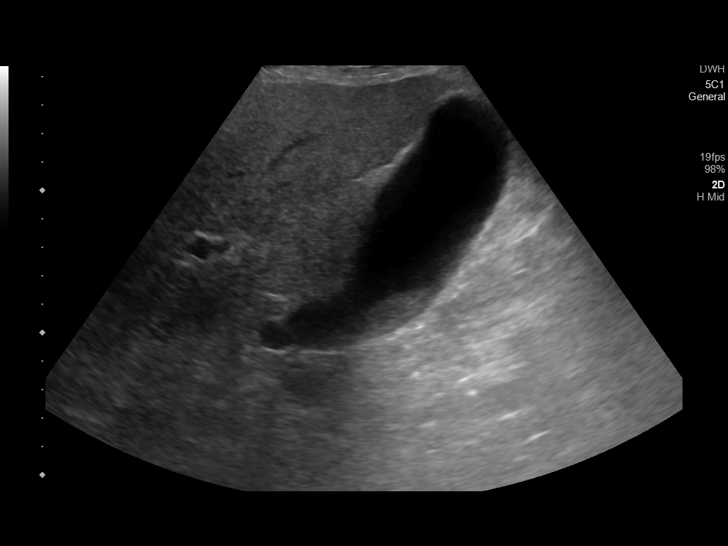
[im 18/53]
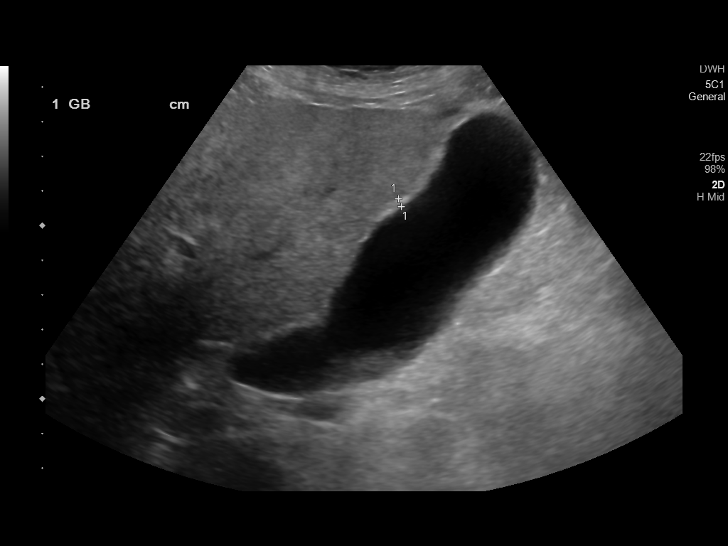
[im 22/53]
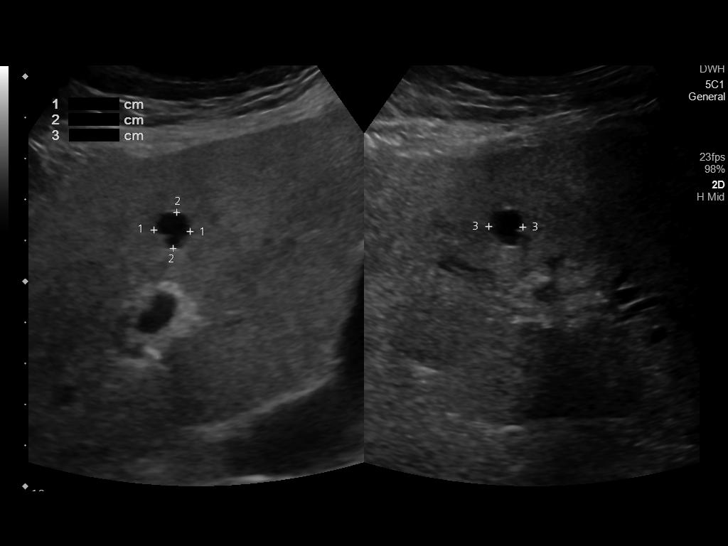
[im 27/53]
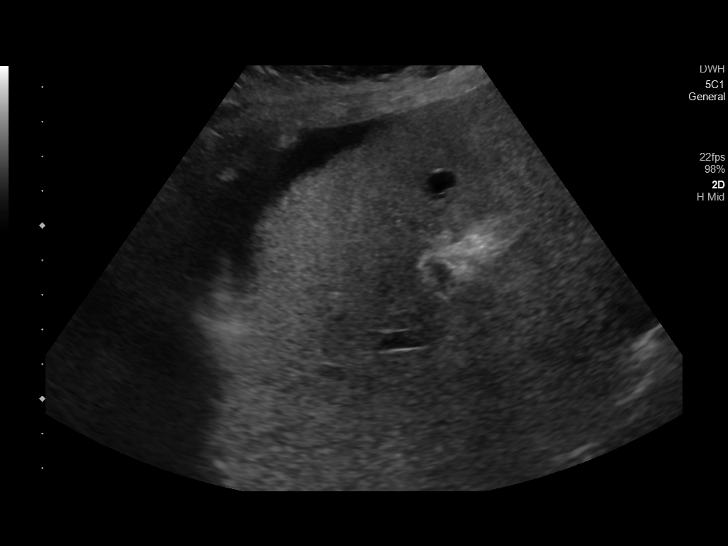
[im 31/53]
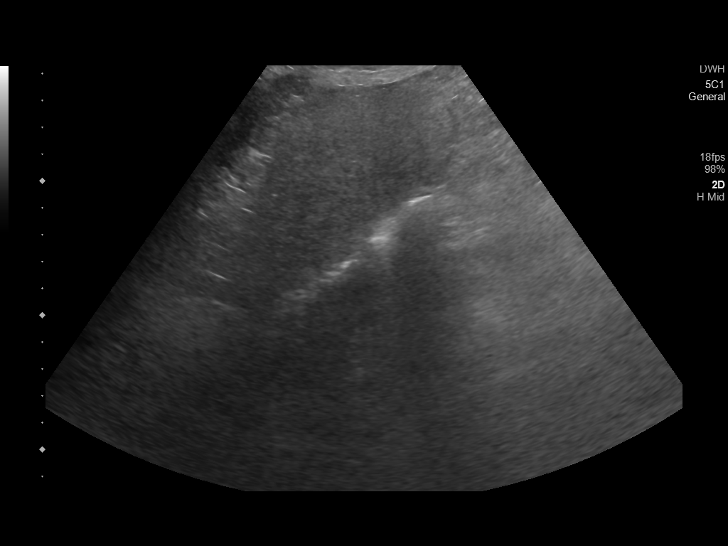
[im 35/53]
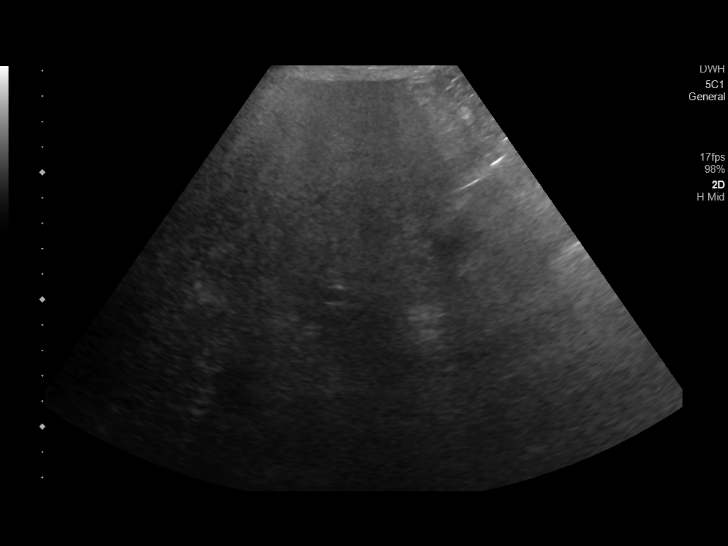
[im 40/53]
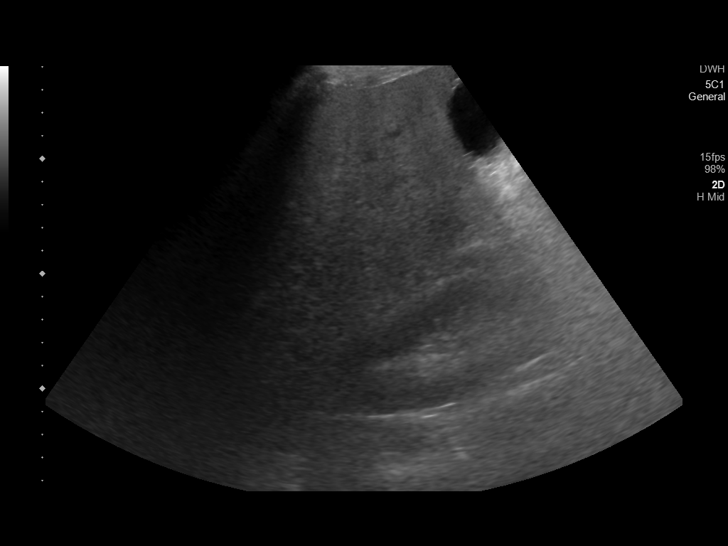
[im 44/53]
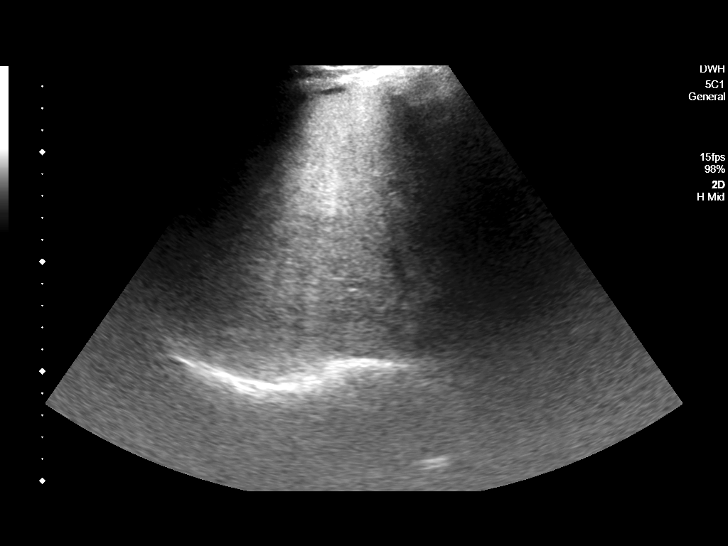
[im 48/53]
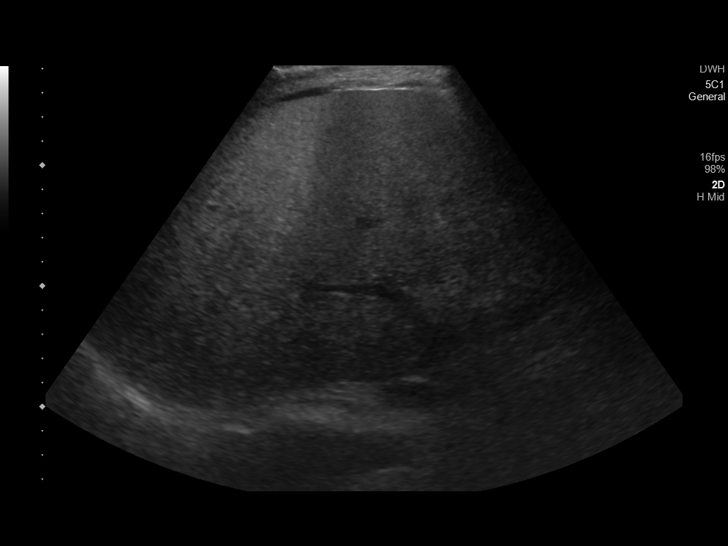
[im 53/53]
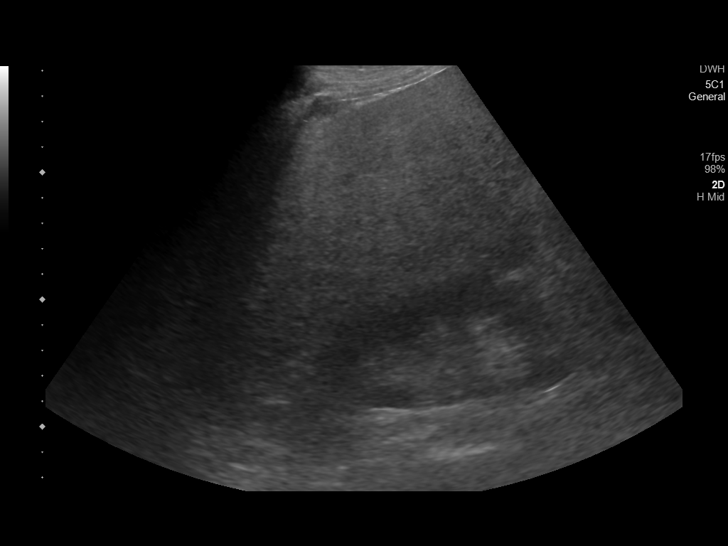

[13 of 25 positions shown; findings below may reference images not displayed]

FINDINGS: Gallbladder:

Gallbladder is well distended with dependent sludge. No wall
thickening or pericholecystic fluid is noted. Negative sonographic
Murphy's sign is elicited.

Common bile duct:

Diameter: 5.2 mm.

Liver:

Diffusely increased in echogenicity. A 9 mm cyst is noted in the
right lobe of the liver. Mild nodularity to the liver is noted.
Portal vein is patent on color Doppler imaging with normal direction
of blood flow towards the liver.

Other: Mild perihepatic ascites.
IMPRESSION: Changes suggestive of early cirrhosis. No biliary ductal dilatation
is seen.

Small hepatic cyst.

Gallbladder sludge without complicating factors.

ADDENDUM:
In the body of the report the sentence regarding the portal vein
should read as follows: Mild patter fugal flow in the portal vein is
noted. This is consistent with the underlying findings of early
cirrhosis.

*** End of Addendum ***
FINDINGS: Gallbladder:

Gallbladder is well distended with dependent sludge. No wall
thickening or pericholecystic fluid is noted. Negative sonographic
Murphy's sign is elicited.

Common bile duct:

Diameter: 5.2 mm.

Liver:

Diffusely increased in echogenicity. A 9 mm cyst is noted in the
right lobe of the liver. Mild nodularity to the liver is noted.
Portal vein is patent on color Doppler imaging with normal direction
of blood flow towards the liver.

Other: Mild perihepatic ascites.
IMPRESSION: Changes suggestive of early cirrhosis. No biliary ductal dilatation
is seen.

Small hepatic cyst.

Gallbladder sludge without complicating factors.

## 2021-01-19 NOTE — ED Triage Notes (Signed)
Pt is here with her husband who noticed a week ago that her eyes were very yellow, husband states that she was very groggy that morning as well, husband states that he was has been trying to get her to the er for the past week and she has been refusing to go, pt is c/o upper abd discomfort, unable to eat, gerd symptoms and intermittent chest pain

## 2021-01-19 NOTE — ED Provider Notes (Addendum)
Emergency Medicine Provider Triage Evaluation Note  [name] Carrillo, a 55 y.o. female  was evaluated in triage.  Pt complains of AMS.  She presents to the ED accompanied by her husband, who noted about a week ago, that her eyes were yellow, and that she was groggy and confused.  She does endorse some right upper quadrant abdominal discomfort, anorexia, intermittent chest discomfort, as well as reflux symptoms.  Review of Systems  Positive: Confusion, abd pain, scleral icterus Negative: FCS  Physical Exam  BP 109/78 (BP Location: Right Arm)   Pulse 85   Temp 97.8 F (36.6 C) (Oral)   Resp 16   Ht 5\' 5"  (1.651 m)   Wt 81.6 kg   LMP 09/07/2015   SpO2 100%   BMI 29.95 kg/m  Gen:   Awake, no distress  NAD Resp:  Normal effort CTA MSK:   Moves extremities without difficulty  Other:  ABD: soft, distented  Medical Decision Making  Medically screening exam initiated at 1:40 PM.  Appropriate orders placed.  [name] Carrillo was informed that the remainder of the evaluation will be completed by another provider, this initial triage assessment does not replace that evaluation, and the importance of remaining in the ED until their evaluation is complete.  Patient ED evaluation 1 week complaint of confusion, abdominal discomfort, and scleral icterus noted by her husband.   [name], PA-C 01/19/21 1343    01/21/21, PA-C 01/19/21 1343    01/21/21, MD 01/20/21 [phone]

## 2021-01-20 ENCOUNTER — Encounter: Payer: Self-pay | Admitting: Internal Medicine

## 2021-01-20 ENCOUNTER — Emergency Department: Payer: BC Managed Care – PPO

## 2021-01-20 ENCOUNTER — Inpatient Hospital Stay
Admission: EM | Admit: 2021-01-20 | Discharge: 2021-02-13 | DRG: 004 | Disposition: A | Discharge status: Other Acute Inpatient Hospital | Payer: BC Managed Care – PPO | Attending: Pulmonary Disease | Admitting: Pulmonary Disease

## 2021-01-20 DIAGNOSIS — I6329 Cerebral infarction due to unspecified occlusion or stenosis of other precerebral arteries: Secondary | ICD-10-CM | POA: Diagnosis present

## 2021-01-20 DIAGNOSIS — J9602 Acute respiratory failure with hypercapnia: Secondary | ICD-10-CM | POA: Diagnosis not present

## 2021-01-20 DIAGNOSIS — E870 Hyperosmolality and hypernatremia: Secondary | ICD-10-CM | POA: Diagnosis not present

## 2021-01-20 DIAGNOSIS — I5031 Acute diastolic (congestive) heart failure: Secondary | ICD-10-CM | POA: Diagnosis not present

## 2021-01-20 DIAGNOSIS — R7401 Elevation of levels of liver transaminase levels: Secondary | ICD-10-CM

## 2021-01-20 DIAGNOSIS — N390 Urinary tract infection, site not specified: Secondary | ICD-10-CM | POA: Diagnosis present

## 2021-01-20 DIAGNOSIS — K766 Portal hypertension: Secondary | ICD-10-CM | POA: Diagnosis present

## 2021-01-20 DIAGNOSIS — D539 Nutritional anemia, unspecified: Secondary | ICD-10-CM | POA: Diagnosis present

## 2021-01-20 DIAGNOSIS — R188 Other ascites: Secondary | ICD-10-CM

## 2021-01-20 DIAGNOSIS — Z9911 Dependence on respirator [ventilator] status: Secondary | ICD-10-CM

## 2021-01-20 DIAGNOSIS — S066X9D Traumatic subarachnoid hemorrhage with loss of consciousness of unspecified duration, subsequent encounter: Secondary | ICD-10-CM | POA: Diagnosis not present

## 2021-01-20 DIAGNOSIS — Z930 Tracheostomy status: Secondary | ICD-10-CM | POA: Diagnosis not present

## 2021-01-20 DIAGNOSIS — K7460 Unspecified cirrhosis of liver: Secondary | ICD-10-CM

## 2021-01-20 DIAGNOSIS — D6959 Other secondary thrombocytopenia: Secondary | ICD-10-CM | POA: Diagnosis present

## 2021-01-20 DIAGNOSIS — I639 Cerebral infarction, unspecified: Secondary | ICD-10-CM | POA: Diagnosis not present

## 2021-01-20 DIAGNOSIS — R140 Abdominal distension (gaseous): Secondary | ICD-10-CM

## 2021-01-20 DIAGNOSIS — J9601 Acute respiratory failure with hypoxia: Secondary | ICD-10-CM | POA: Diagnosis not present

## 2021-01-20 DIAGNOSIS — J690 Pneumonitis due to inhalation of food and vomit: Secondary | ICD-10-CM | POA: Diagnosis not present

## 2021-01-20 DIAGNOSIS — N3000 Acute cystitis without hematuria: Secondary | ICD-10-CM | POA: Diagnosis not present

## 2021-01-20 DIAGNOSIS — E43 Unspecified severe protein-calorie malnutrition: Secondary | ICD-10-CM | POA: Diagnosis present

## 2021-01-20 DIAGNOSIS — R0902 Hypoxemia: Secondary | ICD-10-CM

## 2021-01-20 DIAGNOSIS — K7011 Alcoholic hepatitis with ascites: Secondary | ICD-10-CM | POA: Diagnosis present

## 2021-01-20 DIAGNOSIS — F418 Other specified anxiety disorders: Secondary | ICD-10-CM | POA: Diagnosis present

## 2021-01-20 DIAGNOSIS — G8250 Quadriplegia, unspecified: Secondary | ICD-10-CM | POA: Diagnosis not present

## 2021-01-20 DIAGNOSIS — Z4659 Encounter for fitting and adjustment of other gastrointestinal appliance and device: Secondary | ICD-10-CM

## 2021-01-20 DIAGNOSIS — K7682 Hepatic encephalopathy: Secondary | ICD-10-CM

## 2021-01-20 DIAGNOSIS — G928 Other toxic encephalopathy: Secondary | ICD-10-CM | POA: Diagnosis not present

## 2021-01-20 DIAGNOSIS — F1010 Alcohol abuse, uncomplicated: Secondary | ICD-10-CM | POA: Diagnosis not present

## 2021-01-20 DIAGNOSIS — J188 Other pneumonia, unspecified organism: Secondary | ICD-10-CM | POA: Diagnosis not present

## 2021-01-20 DIAGNOSIS — E871 Hypo-osmolality and hyponatremia: Secondary | ICD-10-CM | POA: Diagnosis present

## 2021-01-20 DIAGNOSIS — K7030 Alcoholic cirrhosis of liver without ascites: Secondary | ICD-10-CM | POA: Diagnosis not present

## 2021-01-20 DIAGNOSIS — J9600 Acute respiratory failure, unspecified whether with hypoxia or hypercapnia: Secondary | ICD-10-CM

## 2021-01-20 DIAGNOSIS — R4182 Altered mental status, unspecified: Secondary | ICD-10-CM | POA: Diagnosis present

## 2021-01-20 DIAGNOSIS — F10231 Alcohol dependence with withdrawal delirium: Secondary | ICD-10-CM | POA: Diagnosis not present

## 2021-01-20 DIAGNOSIS — Z20822 Contact with and (suspected) exposure to covid-19: Secondary | ICD-10-CM | POA: Diagnosis present

## 2021-01-20 DIAGNOSIS — N179 Acute kidney failure, unspecified: Secondary | ICD-10-CM | POA: Diagnosis not present

## 2021-01-20 DIAGNOSIS — F32A Depression, unspecified: Secondary | ICD-10-CM | POA: Diagnosis present

## 2021-01-20 DIAGNOSIS — Z978 Presence of other specified devices: Secondary | ICD-10-CM

## 2021-01-20 DIAGNOSIS — F05 Delirium due to known physiological condition: Secondary | ICD-10-CM | POA: Diagnosis not present

## 2021-01-20 DIAGNOSIS — K7031 Alcoholic cirrhosis of liver with ascites: Secondary | ICD-10-CM | POA: Diagnosis present

## 2021-01-20 DIAGNOSIS — I615 Nontraumatic intracerebral hemorrhage, intraventricular: Secondary | ICD-10-CM | POA: Diagnosis not present

## 2021-01-20 DIAGNOSIS — G9341 Metabolic encephalopathy: Secondary | ICD-10-CM | POA: Diagnosis not present

## 2021-01-20 DIAGNOSIS — D72829 Elevated white blood cell count, unspecified: Secondary | ICD-10-CM | POA: Diagnosis present

## 2021-01-20 DIAGNOSIS — R7989 Other specified abnormal findings of blood chemistry: Secondary | ICD-10-CM | POA: Diagnosis present

## 2021-01-20 DIAGNOSIS — J9621 Acute and chronic respiratory failure with hypoxia: Secondary | ICD-10-CM | POA: Diagnosis not present

## 2021-01-20 DIAGNOSIS — D684 Acquired coagulation factor deficiency: Secondary | ICD-10-CM | POA: Diagnosis present

## 2021-01-20 DIAGNOSIS — Z01818 Encounter for other preprocedural examination: Secondary | ICD-10-CM

## 2021-01-20 DIAGNOSIS — R579 Shock, unspecified: Secondary | ICD-10-CM | POA: Diagnosis not present

## 2021-01-20 DIAGNOSIS — I609 Nontraumatic subarachnoid hemorrhage, unspecified: Secondary | ICD-10-CM | POA: Diagnosis not present

## 2021-01-20 LAB — BASIC METABOLIC PANEL
Anion gap: 11 (ref 5–15)
Anion gap: 11 (ref 5–15)
Anion gap: 11 (ref 5–15)
BUN: 21 mg/dL — ABNORMAL HIGH (ref 6–20)
BUN: 20 mg/dL (ref 6–20)
BUN: 18 mg/dL (ref 6–20)
CO2: 30 mmol/L (ref 22–32)
CO2: 29 mmol/L (ref 22–32)
CO2: 29 mmol/L (ref 22–32)
Calcium: 9.1 mg/dL (ref 8.9–10.3)
Calcium: 9.1 mg/dL (ref 8.9–10.3)
Calcium: 8.8 mg/dL — ABNORMAL LOW (ref 8.9–10.3)
Chloride: 88 mmol/L — ABNORMAL LOW (ref 98–111)
Chloride: 88 mmol/L — ABNORMAL LOW (ref 98–111)
Chloride: 90 mmol/L — ABNORMAL LOW (ref 98–111)
Creatinine, Ser: 0.58 mg/dL (ref 0.44–1.00)
Creatinine, Ser: 0.54 mg/dL (ref 0.44–1.00)
Creatinine, Ser: 0.54 mg/dL (ref 0.44–1.00)
GFR, Estimated: >60 mL/min (ref >60)
GFR, Estimated: >60 mL/min (ref >60)
GFR, Estimated: >60 mL/min (ref >60)
Glucose, Bld: 114 mg/dL — ABNORMAL HIGH (ref 70–99)
Glucose, Bld: 140 mg/dL — ABNORMAL HIGH (ref 70–99)
Glucose, Bld: 109 mg/dL — ABNORMAL HIGH (ref 70–99)
Potassium: 3.4 mmol/L — ABNORMAL LOW (ref 3.5–5.1)
Potassium: 3.3 mmol/L — ABNORMAL LOW (ref 3.5–5.1)
Potassium: 3.1 mmol/L — ABNORMAL LOW (ref 3.5–5.1)
Sodium: 129 mmol/L — ABNORMAL LOW (ref 135–145)
Sodium: 128 mmol/L — ABNORMAL LOW (ref 135–145)
Sodium: 130 mmol/L — ABNORMAL LOW (ref 135–145)

## 2021-01-20 LAB — HEPATITIS PANEL, ACUTE
HCV Ab: NONREACTIVE
Hep A IgM: NONREACTIVE
Hep B C IgM: NONREACTIVE
Hepatitis B Surface Ag: NONREACTIVE

## 2021-01-20 LAB — URINALYSIS, COMPLETE (UACMP) WITH MICROSCOPIC
Glucose, UA: NEGATIVE mg/dL
Hgb urine dipstick: NEGATIVE
Ketones, ur: 5 mg/dL — AB
Leukocytes,Ua: NEGATIVE
Nitrite: NEGATIVE
Protein, ur: NEGATIVE mg/dL
Specific Gravity, Urine: 1.014 (ref 1.005–1.030)
pH: 5 (ref 5.0–8.0)

## 2021-01-20 LAB — IRON AND TIBC
Iron: 123 ug/dL (ref 28–170)
Saturation Ratios: 91 % — ABNORMAL HIGH (ref 10.4–31.8)
TIBC: 136 ug/dL — ABNORMAL LOW (ref 250–450)
UIBC: 13 ug/dL

## 2021-01-20 LAB — URINE DRUG SCREEN, QUALITATIVE (ARMC ONLY)
Amphetamines, Ur Screen: NOT DETECTED
Barbiturates, Ur Screen: NOT DETECTED
Benzodiazepine, Ur Scrn: NOT DETECTED
Cannabinoid 50 Ng, Ur ~~LOC~~: NOT DETECTED
Cocaine Metabolite,Ur ~~LOC~~: NOT DETECTED
Methadone Scn, Ur: NOT DETECTED
Opiate, Ur Screen: NOT DETECTED
Phencyclidine (PCP) Ur S: NOT DETECTED
Tricyclic, Ur Screen: NOT DETECTED

## 2021-01-20 LAB — RESP PANEL BY RT-PCR (FLU A&B, COVID) ARPGX2
Influenza A by PCR: NEGATIVE
Influenza B by PCR: NEGATIVE
SARS Coronavirus 2 by RT PCR: NEGATIVE

## 2021-01-20 LAB — BILIRUBIN, DIRECT: Bilirubin, Direct: 6.9 mg/dL — ABNORMAL HIGH (ref 0.0–0.2)

## 2021-01-20 LAB — VITAMIN B12: Vitamin B-12: 694 pg/mL (ref 180–914)

## 2021-01-20 LAB — RETICULOCYTES
Immature Retic Fract: 53 % — ABNORMAL HIGH (ref 2.3–15.9)
RBC.: 1.5 MIL/uL — ABNORMAL LOW (ref 3.87–5.11)
Retic Count, Absolute: 94.7 10*3/uL (ref 19.0–186.0)
Retic Ct Pct: 6.3 % — ABNORMAL HIGH (ref 0.4–3.1)

## 2021-01-20 LAB — SODIUM, URINE, RANDOM: Sodium, Ur: <10 mmol/L

## 2021-01-20 LAB — PROTIME-INR
INR: 1.3 — ABNORMAL HIGH (ref 0.8–1.2)
Prothrombin Time: 16 seconds — ABNORMAL HIGH (ref 11.4–15.2)

## 2021-01-20 LAB — OSMOLALITY: Osmolality: 275 mOsm/kg (ref 275–295)

## 2021-01-20 LAB — FOLATE: Folate: 2 ng/mL — ABNORMAL LOW (ref >5.9)

## 2021-01-20 LAB — MAGNESIUM: Magnesium: 1.7 mg/dL (ref 1.7–2.4)

## 2021-01-20 LAB — FERRITIN: Ferritin: 1999 ng/mL — ABNORMAL HIGH (ref 11–307)

## 2021-01-20 LAB — APTT: aPTT: 38 seconds — ABNORMAL HIGH (ref 24–36)

## 2021-01-20 LAB — OSMOLALITY, URINE: Osmolality, Ur: 362 mOsm/kg (ref 300–900)

## 2021-01-20 LAB — PHOSPHORUS: Phosphorus: 2.7 mg/dL (ref 2.5–4.6)

## 2021-01-20 IMAGING — CT CT HEAD W/O CM
3 series · 16 of 47 positions shown, 19 images · non-contrast
Comparison: None.

CLINICAL DATA: 55-year-old female with altered mental status,
confusion. Jaundice. Upper abdominal pain.

EXAM:
CT HEAD WITHOUT CONTRAST
TECHNIQUE: Contiguous axial images were obtained from the base of the skull
through the vertex without intravenous contrast.

[Series 2: head wo · axial · 0.43mm/px · z∈[-148,-23]mm · 10 of 31 slices shown, 13 images]
[im 3/31  brain]
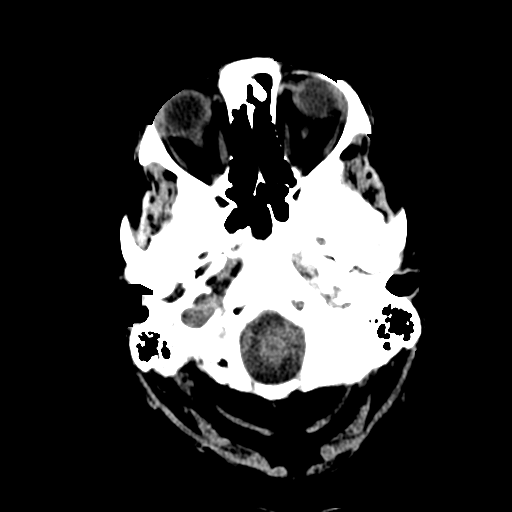
[im 3/31  bone]
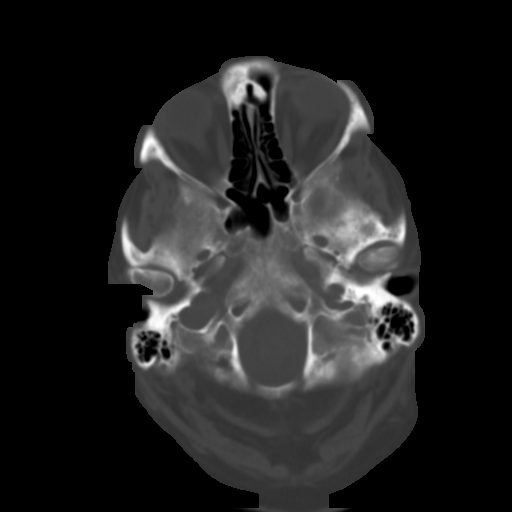
[im 6/31  brain]
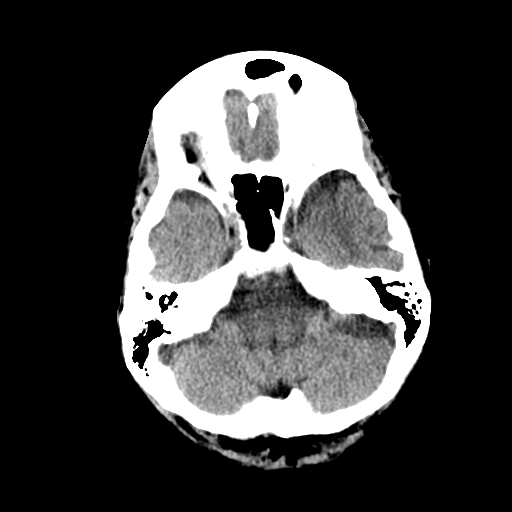
[im 9/31  brain]
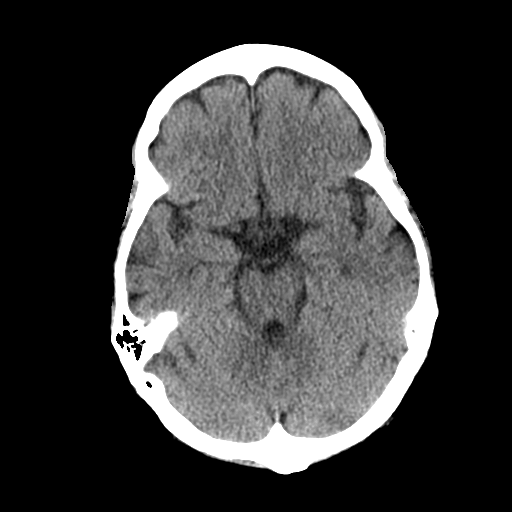
[im 11/31  brain]
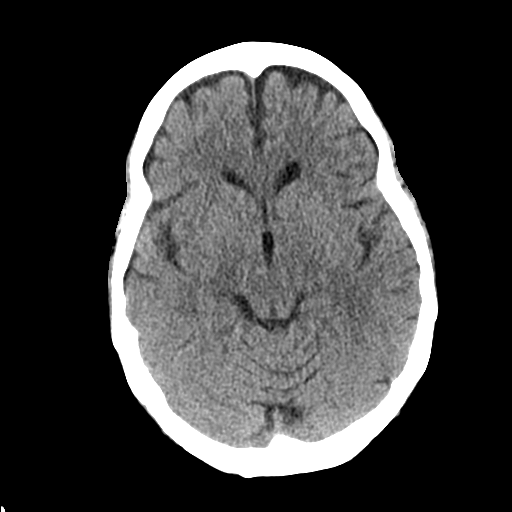
[im 14/31  brain]
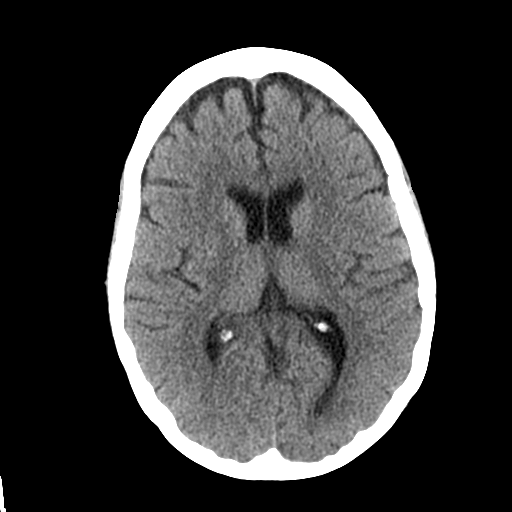
[im 14/31  bone]
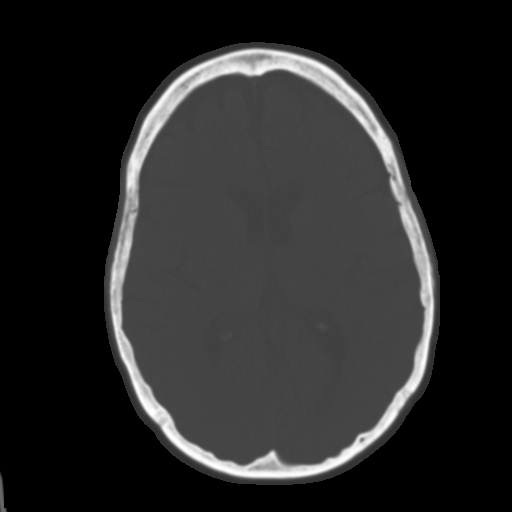
[im 17/31  brain]
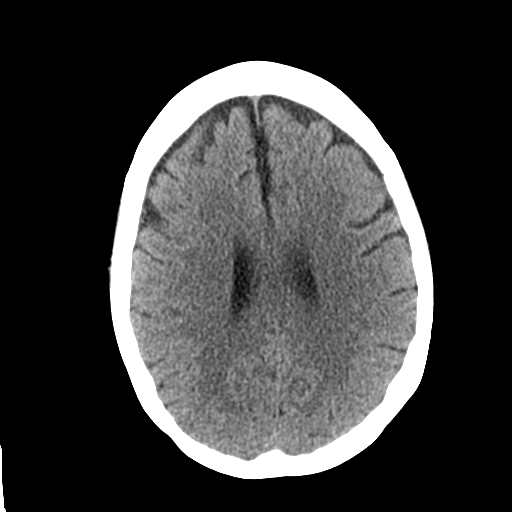
[im 20/31  brain]
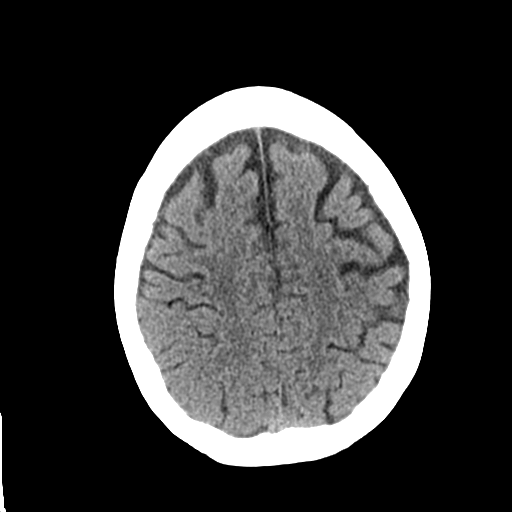
[im 23/31  brain]
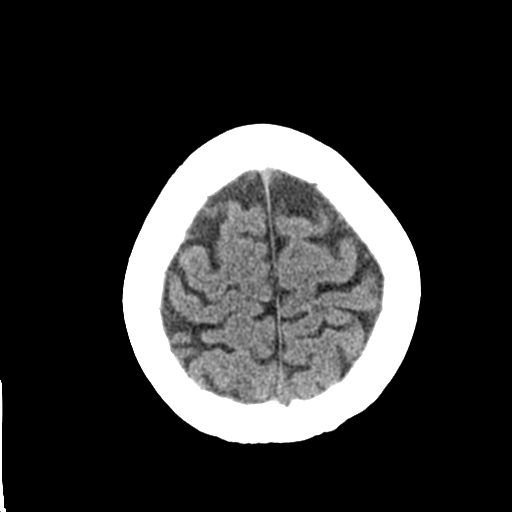
[im 25/31  brain]
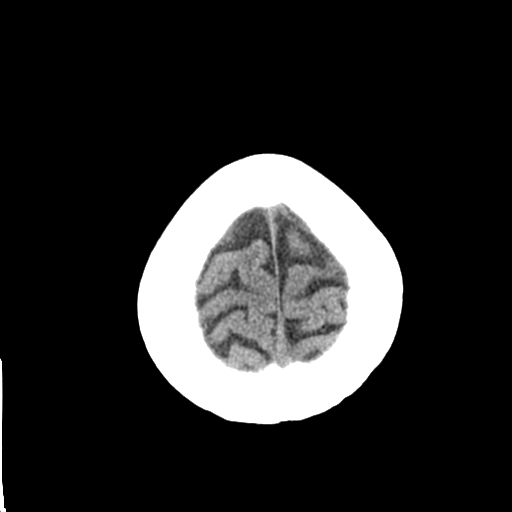
[im 25/31  bone]
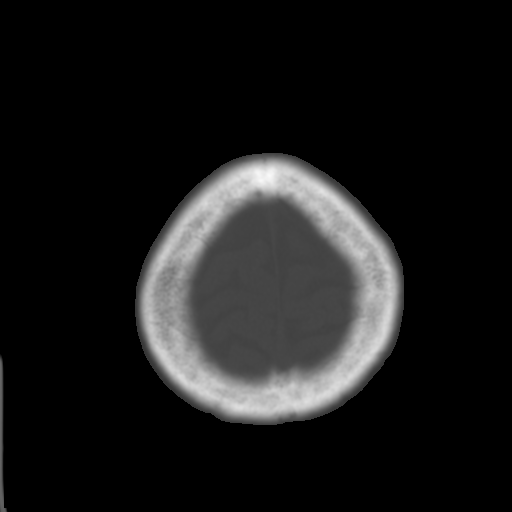
[im 28/31  brain]
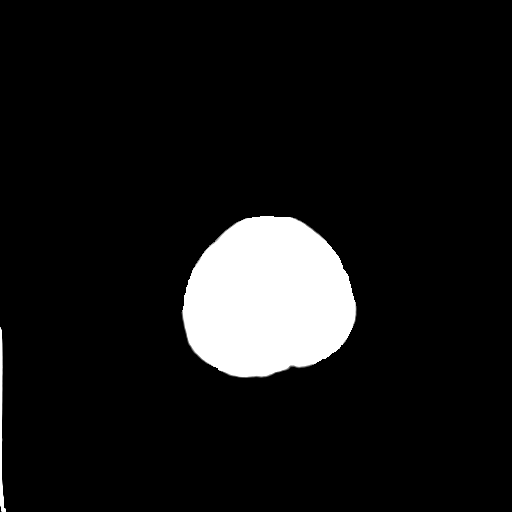

[Series 4: coronal soft tissue · coronal · 0.31mm/px · 3 of 66 slices shown]
[im 22/66  brain]
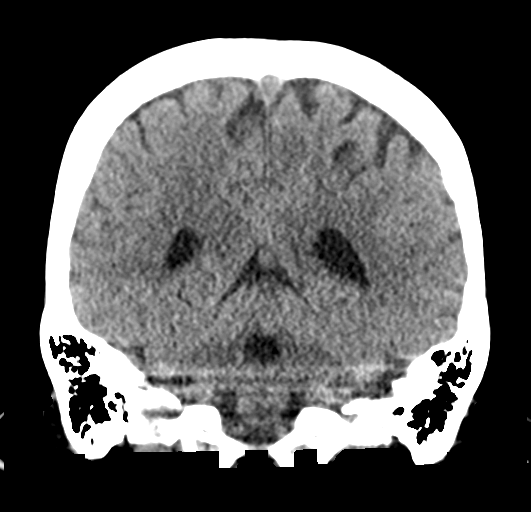
[im 29/66  brain]
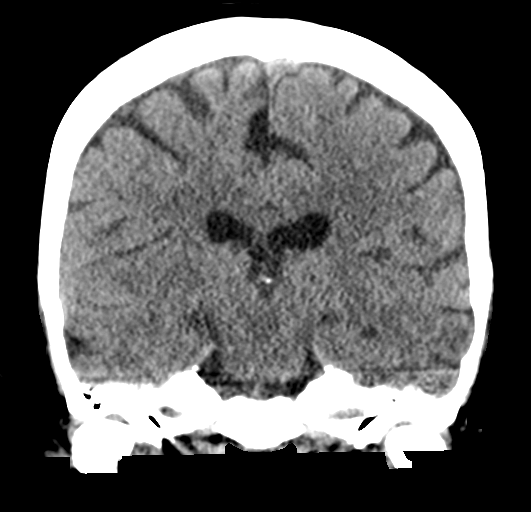
[im 37/66  brain]
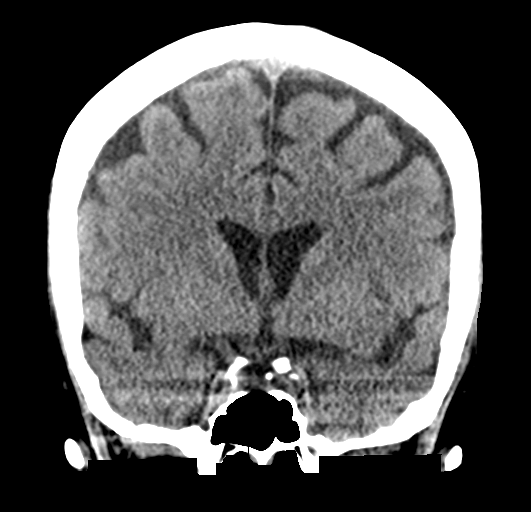

[Series 5: sagittal soft tissue · sagittal · 0.31mm/px · 3 of 60 slices shown]
[im 20/60  brain]
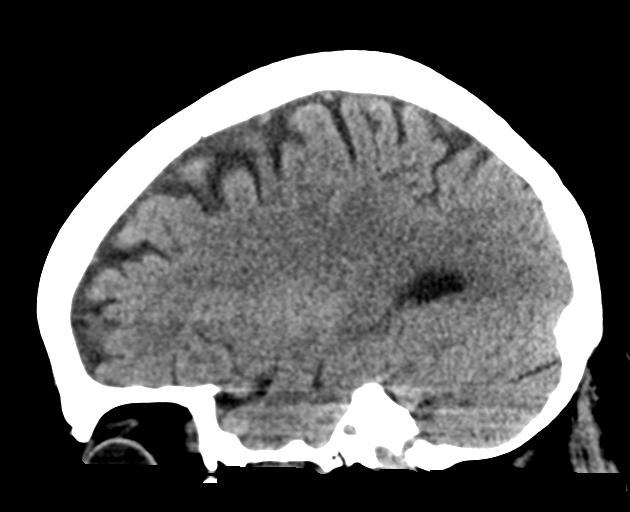
[im 30/60  brain]
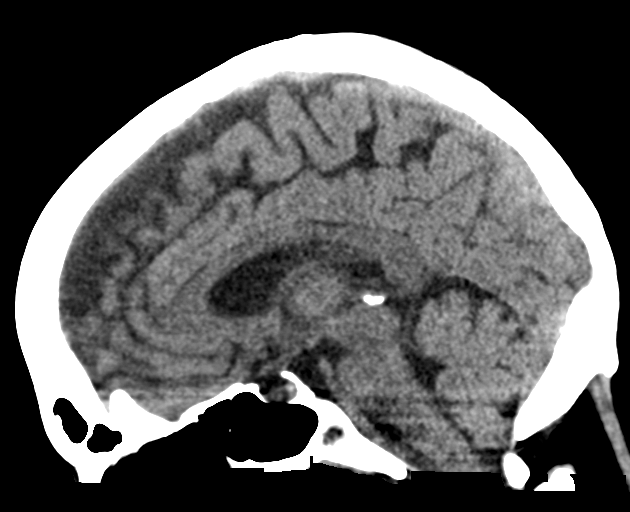
[im 40/60  brain]
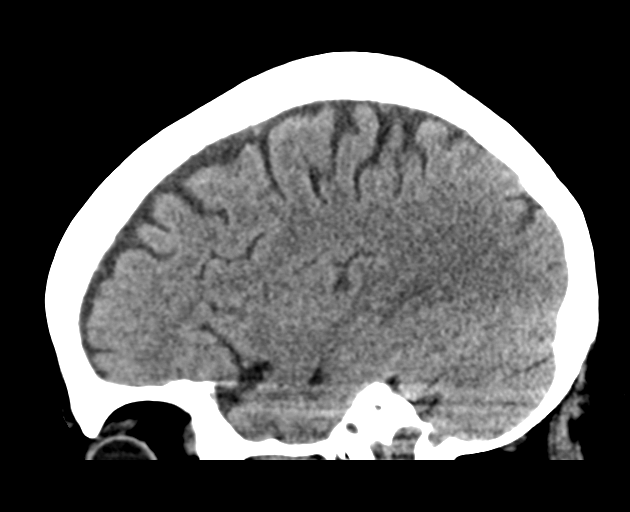

[16 of 47 positions shown; findings below may reference images not displayed]

FINDINGS: Brain: No midline shift, ventriculomegaly, mass effect, evidence of
mass lesion, intracranial hemorrhage or evidence of cortically based
acute infarction. Gray-white matter differentiation is within normal
limits throughout the brain.

Vascular: Mild Calcified atherosclerosis at the skull base. No
suspicious intracranial vascular hyperdensity.

Skull: Negative.

Sinuses/Orbits: Visualized paranasal sinuses and mastoids are clear.

Other: Visualized orbits and scalp soft tissues are within normal
limits.
IMPRESSION: Normal noncontrast Head CT.

## 2021-01-20 IMAGING — DX DG CHEST 1V PORT
1 series · 1 of 1 positions shown · non-contrast
Comparison: [DATE]

CLINICAL DATA: Hypoxia

EXAM:
PORTABLE CHEST 1 VIEW

[chest ap]
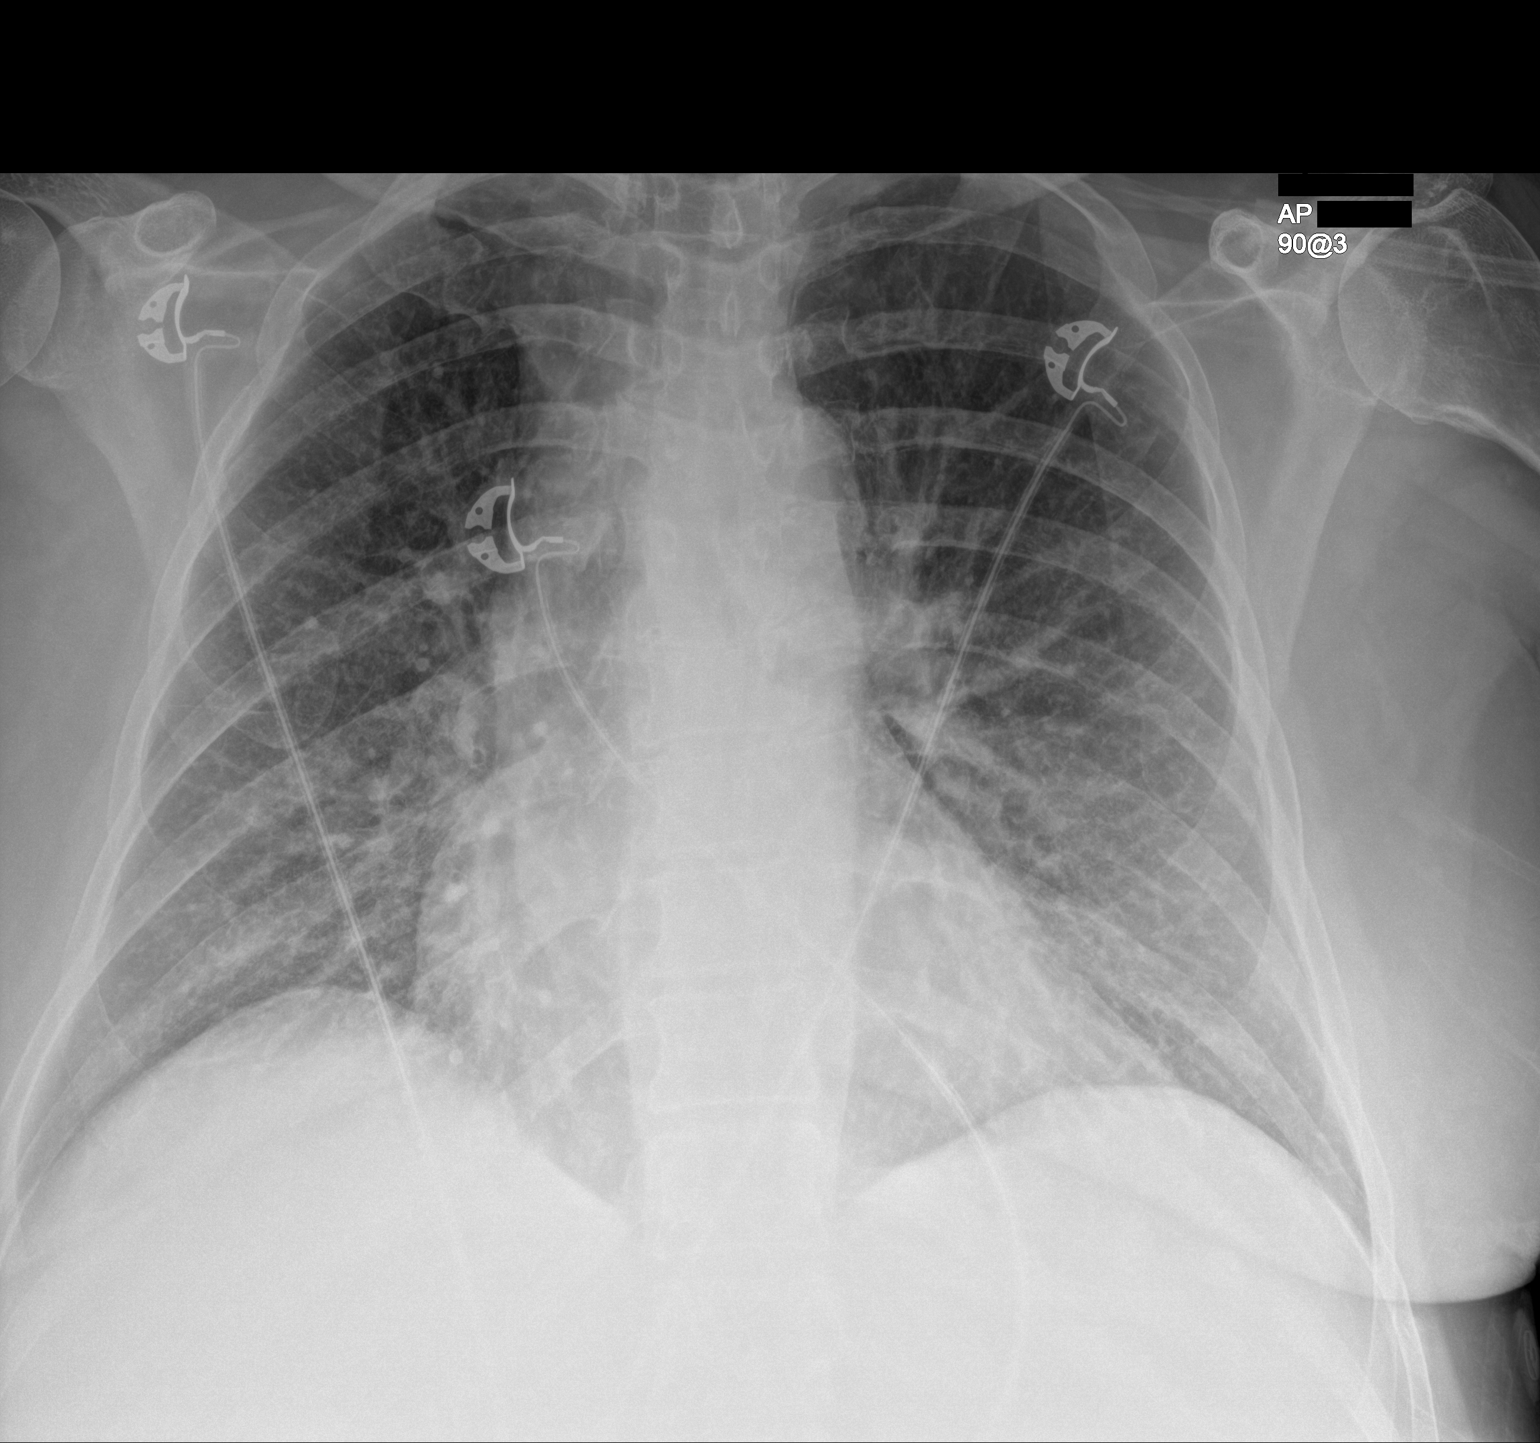

[1 of 1 positions shown; findings below may reference images not displayed]

FINDINGS: Lungs are clear.  No pleural effusion or pneumothorax.

The heart is normal in size.
IMPRESSION: No evidence of acute cardiopulmonary disease.

## 2021-01-20 MED ORDER — DM-GUAIFENESIN ER 30-600 MG PO TB12: 1.0000 | ORAL_TABLET | Freq: Two times a day (BID) | ORAL | Status: DC | PRN

## 2021-01-20 MED ORDER — LACTATED RINGERS IV SOLN: INTRAVENOUS | Status: DC

## 2021-01-20 MED ORDER — LORAZEPAM 1 MG PO TABS
1.0000 mg | ORAL_TABLET | ORAL | Status: AC | PRN
  Administered 2021-01-20: 2 mg via ORAL
  Administered 2021-01-22: 4 mg via ORAL
  Filled 2021-01-20: qty 4
  Filled 2021-01-20: qty 2

## 2021-01-20 MED ORDER — SODIUM CHLORIDE 0.9 % IV SOLN
1.0000 g | INTRAVENOUS | Status: AC
  Administered 2021-01-20 – 2021-01-22 (×3): 1 g via INTRAVENOUS
  Filled 2021-01-20 (×2): qty 10
  Filled 2021-01-20: qty 1

## 2021-01-20 MED ORDER — LORAZEPAM 2 MG/ML IJ SOLN
0.0000 mg | Freq: Two times a day (BID) | INTRAMUSCULAR | Status: DC
  Administered 2021-01-22 – 2021-01-23 (×2): 2 mg via INTRAVENOUS
  Filled 2021-01-20 (×5): qty 1

## 2021-01-20 MED ORDER — LACTATED RINGERS IV BOLUS
1000.0000 mL | Freq: Once | INTRAVENOUS | Status: AC
  Administered 2021-01-20: 1000 mL via INTRAVENOUS

## 2021-01-20 MED ORDER — THIAMINE HCL 100 MG PO TABS
100.0000 mg | ORAL_TABLET | Freq: Every day | ORAL | Status: DC
  Administered 2021-01-20 – 2021-01-21 (×2): 100 mg via ORAL
  Filled 2021-01-20 (×2): qty 1

## 2021-01-20 MED ORDER — IOHEXOL 300 MG/ML  SOLN
100.0000 mL | Freq: Once | INTRAMUSCULAR | Status: AC | PRN
  Administered 2021-01-20: 100 mL via INTRAVENOUS
  Filled 2021-01-20: qty 100

## 2021-01-20 MED ORDER — PANTOPRAZOLE SODIUM 40 MG PO TBEC
40.0000 mg | DELAYED_RELEASE_TABLET | Freq: Every day | ORAL | Status: DC
  Administered 2021-01-21 – 2021-01-24 (×4): 40 mg via ORAL
  Filled 2021-01-20 (×4): qty 1

## 2021-01-20 MED ORDER — LACTULOSE 10 GM/15ML PO SOLN
10.0000 g | Freq: Two times a day (BID) | ORAL | Status: DC
  Administered 2021-01-20 – 2021-01-22 (×5): 10 g via ORAL
  Filled 2021-01-20 (×5): qty 30

## 2021-01-20 MED ORDER — THIAMINE HCL 100 MG/ML IJ SOLN: 100.0000 mg | Freq: Every day | INTRAMUSCULAR | Status: DC

## 2021-01-20 MED ORDER — SODIUM CHLORIDE 0.9 % IV SOLN: INTRAVENOUS | Status: DC

## 2021-01-20 MED ORDER — LORAZEPAM 2 MG/ML IJ SOLN
0.0000 mg | Freq: Four times a day (QID) | INTRAMUSCULAR | Status: DC
  Administered 2021-01-21: 1 mg via INTRAVENOUS
  Administered 2021-01-21: 2 mg via INTRAVENOUS
  Administered 2021-01-22: 4 mg via INTRAVENOUS
  Filled 2021-01-20: qty 1
  Filled 2021-01-20: qty 2
  Filled 2021-01-20: qty 1

## 2021-01-20 MED ORDER — ONDANSETRON HCL 4 MG/2ML IJ SOLN
4.0000 mg | Freq: Three times a day (TID) | INTRAMUSCULAR | Status: DC | PRN
  Administered 2021-02-11: 4 mg via INTRAVENOUS

## 2021-01-20 MED ORDER — LORAZEPAM 2 MG/ML IJ SOLN
1.0000 mg | INTRAMUSCULAR | Status: AC | PRN
  Administered 2021-01-21 (×2): 2 mg via INTRAVENOUS
  Administered 2021-01-22: 1 mg via INTRAVENOUS
  Administered 2021-01-23 (×2): 2 mg via INTRAVENOUS
  Filled 2021-01-20 (×3): qty 1

## 2021-01-20 MED ORDER — ADULT MULTIVITAMIN W/MINERALS CH
1.0000 | ORAL_TABLET | Freq: Every day | ORAL | Status: DC
Start: 2021-01-20 — End: 2021-01-24
  Administered 2021-01-20 – 2021-01-24 (×4): 1 via ORAL
  Filled 2021-01-20 (×5): qty 1

## 2021-01-20 MED ORDER — ALBUTEROL SULFATE (2.5 MG/3ML) 0.083% IN NEBU: 2.5000 mg | INHALATION_SOLUTION | RESPIRATORY_TRACT | Status: DC | PRN

## 2021-01-20 MED ORDER — FOLIC ACID 1 MG PO TABS
1.0000 mg | ORAL_TABLET | Freq: Every day | ORAL | Status: DC
  Administered 2021-01-20 – 2021-01-24 (×5): 1 mg via ORAL
  Filled 2021-01-20 (×5): qty 1

## 2021-01-20 NOTE — Consult Note (Signed)
[facility] GI Inpatient Consult Note   [name] Carrillo, M.D.  Reason for Consult: Alcoholic hepatitis, acute metabolic encephalopathy, presumed cirrhosis.   Attending Requesting Consult: Lorretta Harp, M.D.  History of Present Illness: [name] Carrillo is a 55 y.o. female who was admitted to [facility] today for jaundice and mental confusion.  I saw the patient in the afternoon after hospital admission and the patient says she feels more alert.  Patient admits to having some difficulty over the past week with recollection of things as well as being disoriented.  She admits to drinking heavy amounts of alcohol (Rum and [place]) for several years although she claims to have "cut down" over the past several months to just 2 large cups of mixed drink per day. Patient complains of progressive abdominal distention over the last year.  She also complains of mild swelling in her extremities.  She denies any frank abdominal pain.  Patient denies symptoms of hematemesis, melena or hematochezia.  Patient denies any previous hospitalizations for jaundice or abdominal alcohol withdrawal syndrome.  Patient denies personal history of seizures. Patient complains of recurrent lower leg pain over the last couple of months for which she is taking Advil 2 to 4 tablets nightly.  Patient denies use of any illicit drugs.  Past Medical History:  Past Medical History:  Diagnosis Date   Anxiety    Depression     Problem List: Patient Active Problem List   Diagnosis Date Noted   Cirrhosis of liver (HCC) 01/20/2021   Acute metabolic encephalopathy 01/20/2021   Depression with anxiety 01/20/2021   Alcohol abuse 01/20/2021   Acute hepatic encephalopathy 01/20/2021   Abnormal LFTs 01/20/2021   Hyponatremia 01/20/2021   UTI (urinary tract infection) 01/20/2021   Macrocytic anemia 01/20/2021   Leukocytosis 01/20/2021   Acute respiratory failure with hypoxia (HCC) 01/20/2021   Tibia fracture  02/12/2017    Past Surgical History: Past Surgical History:  Procedure Laterality Date   APPLICATION OF WOUND VAC Left 03/08/2017   Procedure: APPLICATION OF WOUND VAC;  Surgeon: Signa Kell, MD;  Location: [facility];  Service: Orthopedics;  Laterality: Left;   DEBRIDEMENT AND CLOSURE WOUND Left 04/03/2017   Procedure: DEBRIDEMENT AND CLOSURE WOUND;  Surgeon: Peggye Form, DO;  Location: [facility];  Service: Plastics;  Laterality: Left;   HARDWARE REMOVAL Left 03/08/2017   Procedure: HARDWARE REMOVAL-LEFT LEG AND ANKLE;  Surgeon: Signa Kell, MD;  Location: [facility] OR[facility];  Service: Orthopedics;  Laterality: Left;   right leg surgery Right 2014   .IM nailing right leg. screws and rod inserted.   TIBIA IM NAIL INSERTION Left 02/12/2017   Procedure: INTRAMEDULLARY (IM) NAIL TIBIAL;  Surgeon: Signa Kell, MD;  Location: [facility] OR[facility];  Service: Orthopedics;  Laterality: Left;   TUBAL LIGATION  11/2002    Allergies: No Known Allergies  Home Medications: Medications Prior to Admission  Medication Sig Dispense Refill Last Dose   naproxen sodium (ALEVE) 220 MG tablet Take 440-660 mg by mouth 2 (two) times daily as needed (pain).    prn at prn   oxyCODONE (OXY IR/ROXICODONE) 5 MG immediate release tablet Take 1-2 tablets (5-10 mg total) by mouth every 4 (four) hours as needed for severe pain or breakthrough pain. (Patient not taking: Reported on 01/20/2021) 40 tablet 0 Not Taking   sodium chloride flush (NS) 0.9 % SOLN 10-40 mLs by Intracatheter route as needed (flush). (Patient not taking: Reported on 01/20/2021) 10 Syringe 1 Not Taking  Home medication reconciliation was completed with the patient.   Scheduled Inpatient Medications:    folic acid  1 mg Oral Daily   lactulose  10 g Oral BID   LORazepam  0-4 mg Intravenous Q6H   Followed by   Melene Muller ON 01/22/2021] LORazepam  0-4 mg Intravenous Q12H   multivitamin with minerals  1 tablet Oral Daily   [START ON 01/21/2021]  pantoprazole  40 mg Oral Q1200   thiamine  100 mg Oral Daily   Or   thiamine  100 mg Intravenous Daily    Continuous Inpatient Infusions:    sodium chloride 75 mL/hr at 01/20/21 1225   cefTRIAXone (ROCEPHIN)  IV Stopped (01/20/21 0930)    PRN Inpatient Medications:  albuterol, dextromethorphan-guaiFENesin, LORazepam **OR** LORazepam, ondansetron (ZOFRAN) IV  Family History: family history is not on file.   GI Family History: Negative.  Social History:   reports that she quit smoking about 4 years ago. Her smoking use included cigarettes. She smoked an average of .5 packs per day. She has never used smokeless tobacco. She reports current alcohol use. She reports that she does not use drugs. The patient denies ETOH, tobacco, or drug use.    Review of Systems: Review of Systems - History obtained from the patient General ROS: positive for  - sleep disturbance negative for - fever Psychological ROS: positive for - depression negative for - hostility, physical abuse, or suicidal ideation Ophthalmic ROS: negative ENT ROS: negative Allergy and Immunology ROS: negative Hematological and Lymphatic ROS: negative Endocrine ROS: negative Respiratory ROS: no cough, shortness of breath, or wheezing Cardiovascular ROS: no chest pain or dyspnea on exertion Genito-Urinary ROS: no dysuria, trouble voiding, or hematuria Musculoskeletal ROS: positive for - muscle pain and muscular weakness negative for - joint pain Neurological ROS: no TIA or stroke symptoms Dermatological ROS: negative  Physical Examination: BP (!) 98/48 (BP Location: Left Arm) Comment: RN Chelsea and Trina notified  Pulse 81   Temp 97.8 F (36.6 C) (Oral)   Resp 16   Ht 5\' 5"  (1.651 m)   Wt 81.6 kg   LMP 09/07/2015   SpO2 92%   BMI 29.95 kg/m  Physical Exam  Data: Lab Results  Component Value Date   WBC 12.9 (H) 01/19/2021   HGB 8.4 (L) 01/19/2021   HCT 23.9 (L) 01/19/2021   MCV 127.1 (H) 01/19/2021   PLT  150 01/19/2021   Recent Labs  Lab 01/19/21 1341  HGB 8.4*   Lab Results  Component Value Date   NA 128 (L) 01/20/2021   K 3.3 (L) 01/20/2021   CL 88 (L) 01/20/2021   CO2 29 01/20/2021   BUN 20 01/20/2021   CREATININE 0.54 01/20/2021   Lab Results  Component Value Date   ALT 91 (H) 01/19/2021   AST 371 (H) 01/19/2021   ALKPHOS 195 (H) 01/19/2021   BILITOT 16.0 (H) 01/19/2021   Recent Labs  Lab 01/20/21 0240 01/20/21 0858  APTT  --  38*  INR 1.3*  --    CBC Latest Ref Rng & Units 01/19/2021 03/08/2017 02/13/2017  WBC 4.0 - 10.5 K/uL 12.9(H) 11.6(H) 7.9  Hemoglobin 12.0 - 15.0 g/dL 13/03/2017) 11.9(L) 11.9(L)  Hematocrit 36.0 - 46.0 % 23.9(L) 34.8(L) 34.4(L)  Platelets 150 - 400 K/uL 150 353 198    STUDIES: CT HEAD WO CONTRAST (6.9(G)  Result Date: 01/20/2021 CLINICAL DATA:  55 year old female with altered mental status, confusion. Jaundice. Upper abdominal pain. EXAM: CT HEAD WITHOUT CONTRAST TECHNIQUE:  Contiguous axial images were obtained from the base of the skull through the vertex without intravenous contrast. COMPARISON:  None. FINDINGS: Brain: No midline shift, ventriculomegaly, mass effect, evidence of mass lesion, intracranial hemorrhage or evidence of cortically based acute infarction. Gray-white matter differentiation is within normal limits throughout the brain. Vascular: Mild Calcified atherosclerosis at the skull base. No suspicious intracranial vascular hyperdensity. Skull: Negative. Sinuses/Orbits: Visualized paranasal sinuses and mastoids are clear. Other: Visualized orbits and scalp soft tissues are within normal limits. IMPRESSION: Normal noncontrast Head CT. Electronically Signed   By: Odessa Fleming M.D.   On: 01/20/2021 04:37   CT ABDOMEN PELVIS W CONTRAST  Result Date: 01/20/2021 CLINICAL DATA:  55 year old female with altered mental status, confusion. Jaundice. Upper abdominal pain. EXAM: CT ABDOMEN AND PELVIS WITH CONTRAST TECHNIQUE: Multidetector CT imaging of  the abdomen and pelvis was performed using the standard protocol following bolus administration of intravenous contrast. CONTRAST:  OMNIPAQUE IOHEXOL 300 MG/ML  SOLN COMPARISON:  Right upper quadrant ultrasound yesterday. FINDINGS: Lower chest: Borderline to mild cardiomegaly. No pericardial effusion. Confluent but enhancing lung base opacity in keeping with atelectasis. No pleural effusion. Hepatobiliary: Geographic type heterogeneous enhancement of the liver, with hepatomegaly. Liver length is 20 cm plus. Trace perihepatic free fluid. Recanalized paraumbilical vein. No discrete liver lesion. Gallbladder is within normal limits. No biliary ductal dilatation. Pancreas: Negative. Spleen: Trace perisplenic fluid. Estimated splenic volume 300 mL (normal splenic volume range 83 - 412 mL). No discrete splenic lesion. Adrenals/Urinary Tract: Adrenal glands are within normal limits. Kidneys are nonobstructed. There is punctate nephrolithiasis primarily on the right. However, on the delayed images there is no significant renal contrast excretion. The ureters are decompressed. Diminutive, unremarkable bladder. In Stomach/Bowel: Oral contrast type material mixed with stool in the large bowel which is mostly decompressed throughout. The cecum is decompressed. Appendix remains within normal limits arising on coronal image 45. No convincing large bowel inflammation. No dilated small bowel. Stomach is decompressed with possible small gastric hiatal hernia. Small duodenum diverticulum in the midline with no active inflammation. No free air. Only trace free fluid in the abdomen. Vascular/Lymphatic: Mild Aortoiliac calcified atherosclerosis. Major arterial structures remain patent. Portal venous system is patent. There is a recanalized paraumbilical vein (series 2, image 26) and there are small spleno renal varices. No lymphadenopathy. Reproductive: Negative. Other: Small volume pelvic free fluid with simple fluid density  (series 2, image 75). Musculoskeletal: Negative. IMPRESSION: 1. Hepatomegaly with heterogeneous liver enhancement and evidence of portal venous hypertension including recanalized paraumbilical vein, small spleno-renal varices, and small volume ascites. Chronic liver disease strongly suspected although the enhancement pattern might indicate a superimposed acute hepatitis. No evidence of bile duct obstruction. No discrete liver lesion. 2. No obstructive uropathy but absent renal contrast excretion on the delayed images suggesting acute renal insufficiency. Punctate nephrolithiasis. 3. Borderline to mild cardiomegaly. Bilateral lower lobe atelectasis. Mild aortic atherosclerosis. Electronically Signed   By: Odessa Fleming M.D.   On: 01/20/2021 04:46   DG Chest Portable 1 View  Result Date: 01/20/2021 CLINICAL DATA:  Hypoxia EXAM: PORTABLE CHEST 1 VIEW COMPARISON:  04/28/2012 FINDINGS: Lungs are clear.  No pleural effusion or pneumothorax. The heart is normal in size. IMPRESSION: No evidence of acute cardiopulmonary disease. Electronically Signed   By: Charline Bills M.D.   On: 01/20/2021 03:36   US ABDOMEN LIMITED RUQ (LIVER/GB)  Addendum Date: 01/20/2021   ADDENDUM REPORT: 01/20/2021 02:35 ADDENDUM: In the body of the report the sentence regarding the portal  vein should read as follows: Mild patter fugal flow in the portal vein is noted. This is consistent with the underlying findings of early cirrhosis. Electronically Signed   By: Alcide Clever M.D.   On: 01/20/2021 02:35   Result Date: 01/20/2021 CLINICAL DATA:  Jaundice EXAM: ULTRASOUND ABDOMEN LIMITED RIGHT UPPER QUADRANT COMPARISON:  None. FINDINGS: Gallbladder: Gallbladder is well distended with dependent sludge. No wall thickening or pericholecystic fluid is noted. Negative sonographic Murphy's sign is elicited. Common bile duct: Diameter: 5.2 mm. Liver: Diffusely increased in echogenicity. A 9 mm cyst is noted in the right lobe of the liver. Mild  nodularity to the liver is noted. Portal vein is patent on color Doppler imaging with normal direction of blood flow towards the liver. Other: Mild perihepatic ascites. IMPRESSION: Changes suggestive of early cirrhosis. No biliary ductal dilatation is seen. Small hepatic cyst. Gallbladder sludge without complicating factors. Electronically Signed: By: Alcide Clever M.D. On: 01/20/2021 01:04   @IMAGES @  Assessment:  Presumed hepatic encephalopathy - Improved after a dose of lactulose. Alcoholic hepatitis - Calculated discriminant function = 21, therefore steroids NOT necessary treatment. Ascites on examination with protuberant abdomen with fluid wave. Empiric ceftriaxone ordered, no paracentesis performed yet. Decompensated liver disease secondary to alcohol abuse, possible early cirrhosis, but no definite findings thus far. 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 6. Personal history of depression leading to excessive alcohol ingestion.  COVID-19 status: Tested Negative.  Recommendations:  Continue lactulose. Alcohol rehabilitation and cessation. Consider mental health counseling. Diagnostic paracentesis, ordered. I told the patient to avoid NSAIDs, may take Tylenol up to 2 g daily for leg pain. Consider ways to decrease sodium level (?Fluid restriction). If ascitic fluid shows consistent with portal hypertension as expected, diuretic therapy and low Na diet will be advised. We will follow patient.  Thank you for the consult. Please call with questions or concerns.  , "Rosina Lowenstein MD [facility] [address] [place], [place] [phone]  01/20/2021 6:22 PM

## 2021-01-20 NOTE — H&P (Signed)
History and Physical    [name] Carrillo OAC:[id number] DOB: Oct 18, 1965 DOA: 01/20/2021  Referring MD/NP/PA:   PCP: Patient, No Pcp Per (Inactive)   Patient coming from:  The patient is coming from home.  At baseline, pt is independent for most of ADL.        Chief Complaint: AMS, jaundice, abdominal pain  HPI: [name] Carrillo is a 55 y.o. female with medical history significant of depression with anxiety, alcohol abuse, who presented with altered mental status, jaundice, abdominal pain.  Per her husband at the bedside, patient has been intermittently confused for almost a week.  Patient moves all extremities normally.  No facial droop or slurred speech.  She has malaise and weakness.  She has jaundice with yellow eyes.  Patient has mild pain in upper abdomen.  Denies nausea, vomiting or diarrhea.  Denies dark stool or rectal bleeding.  Patient denies chest pain, cough, shortness breath.  Patient states that she has difficulty urinating, she states she is not sure if she has burning or dysuria. When I saw patient in ED, patient is lethargic, but orientated x3.  She moves all extremities normally  ED Course: pt was found to have WBC 12.9, INR 1.3, troponin level 7, lipase 54, negative COVID PCR, ammonia 35, urinalysis (with hazy appearance, negative leukocyte, many bacteria, WBC 6-10), abnormal liver function (ALP 195, AST 371, ALT 91, total bilirubin 16.0), magnesium 1.7, phosphorus 2.7, sodium 124, potassium 3.9, renal function okay.  Temperature normal, blood pressure 99/58, heart rate 87, RR 19, oxygen saturation 88% on room air, which improved to 95 on 2 L oxygen.  Chest x-ray negative.  CT of head is negative.  Patient is admitted to MedSurg bed as inpatient.  Dr. Norma Fredrickson of GI is consulted.  US-RUQ: In the body of the report the sentence regarding the portal vein should read as follows: Mild patter fugal flow in the portal vein is noted. This is consistent with the underlying findings of  early cirrhosis.   CT-abd/pelvis 1. Hepatomegaly with heterogeneous liver enhancement and evidence of portal venous hypertension including recanalized paraumbilical vein, small spleno-renal varices, and small volume ascites. Chronic liver disease strongly suspected although the enhancement pattern might indicate a superimposed acute hepatitis. No evidence of bile duct obstruction. No discrete liver lesion.   2. No obstructive uropathy but absent renal contrast excretion on the delayed images suggesting acute renal insufficiency. Punctate nephrolithiasis.   3. Borderline to mild cardiomegaly. Bilateral lower lobe atelectasis. Mild aortic atherosclerosis.   Review of Systems:   General: no fevers, chills, no body weight gain, has poor appetite, has fatigue HEENT: no blurry vision, hearing changes or sore throat Respiratory: no dyspnea, coughing, wheezing CV: no chest pain, no palpitations GI: no nausea, vomiting, has abdominal pain, no diarrhea, constipation GU: no dysuria, burning on urination, increased urinary frequency, hematuria  Ext: no leg edema Neuro: no unilateral weakness, numbness, or tingling, no vision change or hearing loss Skin: no rash, no skin tear. MSK: No muscle spasm, no deformity, no limitation of range of movement in spin. Has AMS Heme: No easy bruising.  Travel history: No recent long distant travel.  Allergy: No Known Allergies  Past Medical History:  Diagnosis Date   Anxiety    Depression     Past Surgical History:  Procedure Laterality Date   APPLICATION OF WOUND VAC Left 03/08/2017   Procedure: APPLICATION OF WOUND VAC;  Surgeon: Signa Kell, MD;  Location: [facility];  Service: Orthopedics;  Laterality: Left;   DEBRIDEMENT AND CLOSURE WOUND Left 04/03/2017   Procedure: DEBRIDEMENT AND CLOSURE WOUND;  Surgeon: Peggye Form, DO;  Location: [facility];  Service: Plastics;  Laterality: Left;   HARDWARE REMOVAL Left 03/08/2017    Procedure: HARDWARE REMOVAL-LEFT LEG AND ANKLE;  Surgeon: Signa Kell, MD;  Location: [facility];  Service: Orthopedics;  Laterality: Left;   right leg surgery Right 2014   .IM nailing right leg. screws and rod inserted.   TIBIA IM NAIL INSERTION Left 02/12/2017   Procedure: INTRAMEDULLARY (IM) NAIL TIBIAL;  Surgeon: Signa Kell, MD;  Location: [facility];  Service: Orthopedics;  Laterality: Left;   TUBAL LIGATION  11/2002    Social History:  reports that she quit smoking about 4 years ago. Her smoking use included cigarettes. She smoked an average of .5 packs per day. She has never used smokeless tobacco. She reports current alcohol use. She reports that she does not use drugs.  Family History: History reviewed. No pertinent family history.   Prior to Admission medications   Medication Sig Start Date End Date Taking? Authorizing Provider  naproxen sodium (ALEVE) 220 MG tablet Take 440-660 mg by mouth 2 (two) times daily as needed (pain).     [provider]  oxyCODONE (OXY IR/ROXICODONE) 5 MG immediate release tablet Take 1-2 tablets (5-10 mg total) by mouth every 4 (four) hours as needed for severe pain or breakthrough pain. 03/09/17   Dedra Skeens, PA-C  sodium chloride flush (NS) 0.9 % SOLN 10-40 mLs by Intracatheter route as needed (flush). 03/10/17   Dedra Skeens, PA-C    Physical Exam: Vitals:   01/20/21 1254 01/20/21 1410 01/20/21 1412 01/20/21 1501  BP: (!) 100/59 (!) 100/46 (!) 106/52 (!) 98/48  Pulse:  79 77 81  Resp:  16  16  Temp: 98.4 F (36.9 C) 97.8 F (36.6 C)  97.8 F (36.6 C)  TempSrc: Oral Oral  Oral  SpO2:  93% 94% 92%  Weight:      Height:       General: Not in acute distress HEENT:       Eyes: PERRL, EOMI, has scleral icterus.       ENT: No discharge from the ears and nose       Neck: No JVD, no bruit, no mass felt. Heme: No neck lymph node enlargement. Cardiac: S1/S2, RRR, No murmurs, No gallops or rubs. Respiratory: No rales, wheezing, rhonchi  or rubs. GI: Mildly distended, with mild tenderness in epigastric area , no rebound pain, no organomegaly, BS present. GU: No hematuria Ext: No pitting leg edema bilaterally. 1+DP/PT pulse bilaterally. Musculoskeletal: No joint deformities, No joint redness or warmth, no limitation of ROM in spin. Skin: No rashes.  Neuro: Drowsy, lethargic, but is still oriented X3, cranial nerves II-XII grossly intact, moves all extremities normally. Psych: Patient is not psychotic, no suicidal or hemocidal ideation.  Labs on Admission: I have personally reviewed following labs and imaging studies  CBC: Recent Labs  Lab 01/19/21 1341  WBC 12.9*  HGB 8.4*  HCT 23.9*  MCV 127.1*  PLT 150   Basic Metabolic Panel: Recent Labs  Lab 01/19/21 1341 01/20/21 0955 01/20/21 1526  NA 124* 129* 128*  K 3.9 3.4* 3.3*  CL 81* 88* 88*  CO2 28 30 29   GLUCOSE 116* 114* 140*  BUN 26* 21* 20  CREATININE 0.75 0.58 0.54  CALCIUM 9.9 9.1 9.1  MG  --  1.7  --   PHOS  --  2.7  --    GFR: Estimated Creatinine Clearance: 82.8 mL/min (by C-G formula based on SCr of 0.54 mg/dL). Liver Function Tests: Recent Labs  Lab 01/19/21 1341  AST 371*  ALT 91*  ALKPHOS 195*  BILITOT 16.0*  PROT 7.6  ALBUMIN 2.6*   Recent Labs  Lab 01/19/21 1341  LIPASE 54*   Recent Labs  Lab 01/19/21 1341  AMMONIA 35   Coagulation Profile: Recent Labs  Lab 01/20/21 0240  INR 1.3*   Cardiac Enzymes: No results for input(s): CKTOTAL, CKMB, CKMBINDEX, TROPONINI in the last 168 hours. BNP (last 3 results) No results for input(s): PROBNP in the last 8760 hours. HbA1C: No results for input(s): HGBA1C in the last 72 hours. CBG: No results for input(s): GLUCAP in the last 168 hours. Lipid Profile: No results for input(s): CHOL, HDL, LDLCALC, TRIG, CHOLHDL, LDLDIRECT in the last 72 hours. Thyroid Function Tests: No results for input(s): TSH, T4TOTAL, FREET4, T3FREE, THYROIDAB in the last 72 hours. Anemia Panel: Recent  Labs    01/20/21 0955 01/20/21 1019  VITAMINB12 694  --   FOLATE 2.0*  --   FERRITIN 1,999*  --   TIBC 136*  --   IRON 123  --   RETICCTPCT  --  6.3*   Urine analysis:    Component Value Date/Time   COLORURINE AMBER (A) 01/19/2021 2340   APPEARANCEUR HAZY (A) 01/19/2021 2340   LABSPEC 1.014 01/19/2021 2340   PHURINE 5.0 01/19/2021 2340   GLUCOSEU NEGATIVE 01/19/2021 2340   HGBUR NEGATIVE 01/19/2021 2340   BILIRUBINUR SMALL (A) 01/19/2021 2340   KETONESUR 5 (A) 01/19/2021 2340   PROTEINUR NEGATIVE 01/19/2021 2340   NITRITE NEGATIVE 01/19/2021 2340   LEUKOCYTESUR NEGATIVE 01/19/2021 2340   Sepsis Labs: @LABRCNTIP (procalcitonin:4,lacticidven:4) ) Recent Results (from the past 240 hour(s))  Resp Panel by RT-PCR (Flu A&B, Covid) Nasopharyngeal Swab     Status: None   Collection Time: 01/20/21  1:43 AM   Specimen: Nasopharyngeal Swab; Nasopharyngeal(NP) swabs in vial transport medium  Result Value Ref Range Status   SARS Coronavirus 2 by RT PCR NEGATIVE NEGATIVE Final    Comment: (NOTE) SARS-CoV-2 target nucleic acids are NOT DETECTED.  The SARS-CoV-2 RNA is generally detectable in upper respiratory specimens during the acute phase of infection. The lowest concentration of SARS-CoV-2 viral copies this assay can detect is 138 copies/mL. A negative result does not preclude SARS-Cov-2 infection and should not be used as the sole basis for treatment or other patient management decisions. A negative result may occur with  improper specimen collection/handling, submission of specimen other than nasopharyngeal swab, presence of viral mutation(s) within the areas targeted by this assay, and inadequate number of viral copies(<138 copies/mL). A negative result must be combined with clinical observations, patient history, and epidemiological information. The expected result is Negative.  Fact Sheet for Patients:  01/22/21  Fact Sheet for  Healthcare Providers:  BloggerCourse.com  This test is no t yet approved or cleared by the [employer] FDA and  has been authorized for detection and/or diagnosis of SARS-CoV-2 by FDA under an Emergency Use Authorization (EUA). This EUA will remain  in effect (meaning this test can be used) for the duration of the COVID-19 declaration under Section 564(b)(1) of the Act, 21 U.S.C.section 360bbb-3(b)(1), unless the authorization is terminated  or revoked sooner.       Influenza A by PCR NEGATIVE NEGATIVE Final   Influenza B by PCR NEGATIVE NEGATIVE Final    Comment: (NOTE) The  Xpert Xpress SARS-CoV-2/FLU/RSV plus assay is intended as an aid in the diagnosis of influenza from Nasopharyngeal swab specimens and should not be used as a sole basis for treatment. Nasal washings and aspirates are unacceptable for Xpert Xpress SARS-CoV-2/FLU/RSV testing.  Fact Sheet for Patients: BloggerCourse.com  Fact Sheet for Healthcare Providers: SeriousBroker.it  This test is not yet approved or cleared by the [place] FDA and has been authorized for detection and/or diagnosis of SARS-CoV-2 by FDA under an Emergency Use Authorization (EUA). This EUA will remain in effect (meaning this test can be used) for the duration of the COVID-19 declaration under Section 564(b)(1) of the Act, 21 U.S.C. section 360bbb-3(b)(1), unless the authorization is terminated or revoked.  Performed at [facility], [address] [place], [place] [address]      Radiological Exams on Admission: CT HEAD WO CONTRAST ( )  Result Date: 01/20/2021 CLINICAL DATA:  55 year old female with altered mental status, confusion. Jaundice. Upper abdominal pain. EXAM: CT HEAD WITHOUT CONTRAST TECHNIQUE: Contiguous axial images were obtained from the base of the skull through the vertex without intravenous contrast. COMPARISON:  None.  FINDINGS: Brain: No midline shift, ventriculomegaly, mass effect, evidence of mass lesion, intracranial hemorrhage or evidence of cortically based acute infarction. Gray-white matter differentiation is within normal limits throughout the brain. Vascular: Mild Calcified atherosclerosis at the skull base. No suspicious intracranial vascular hyperdensity. Skull: Negative. Sinuses/Orbits: Visualized paranasal sinuses and mastoids are clear. Other: Visualized orbits and scalp soft tissues are within normal limits. IMPRESSION: Normal noncontrast Head CT. Electronically Signed   By: Odessa Fleming M.D.   On: 01/20/2021 04:37   CT ABDOMEN PELVIS W CONTRAST  Result Date: 01/20/2021 CLINICAL DATA:  55 year old female with altered mental status, confusion. Jaundice. Upper abdominal pain. EXAM: CT ABDOMEN AND PELVIS WITH CONTRAST TECHNIQUE: Multidetector CT imaging of the abdomen and pelvis was performed using the standard protocol following bolus administration of intravenous contrast. CONTRAST:  OMNIPAQUE IOHEXOL 300 MG/ML  SOLN COMPARISON:  Right upper quadrant ultrasound yesterday. FINDINGS: Lower chest: Borderline to mild cardiomegaly. No pericardial effusion. Confluent but enhancing lung base opacity in keeping with atelectasis. No pleural effusion. Hepatobiliary: Geographic type heterogeneous enhancement of the liver, with hepatomegaly. Liver length is 20 cm plus. Trace perihepatic free fluid. Recanalized paraumbilical vein. No discrete liver lesion. Gallbladder is within normal limits. No biliary ductal dilatation. Pancreas: Negative. Spleen: Trace perisplenic fluid. Estimated splenic volume 300 mL (normal splenic volume range 83 - 412 mL). No discrete splenic lesion. Adrenals/Urinary Tract: Adrenal glands are within normal limits. Kidneys are nonobstructed. There is punctate nephrolithiasis primarily on the right. However, on the delayed images there is no significant renal contrast excretion. The ureters are  decompressed. Diminutive, unremarkable bladder. In Stomach/Bowel: Oral contrast type material mixed with stool in the large bowel which is mostly decompressed throughout. The cecum is decompressed. Appendix remains within normal limits arising on coronal image 45. No convincing large bowel inflammation. No dilated small bowel. Stomach is decompressed with possible small gastric hiatal hernia. Small duodenum diverticulum in the midline with no active inflammation. No free air. Only trace free fluid in the abdomen. Vascular/Lymphatic: Mild Aortoiliac calcified atherosclerosis. Major arterial structures remain patent. Portal venous system is patent. There is a recanalized paraumbilical vein (series 2, image 26) and there are small spleno renal varices. No lymphadenopathy. Reproductive: Negative. Other: Small volume pelvic free fluid with simple fluid density (series 2, image 75). Musculoskeletal: Negative. IMPRESSION: 1. Hepatomegaly with heterogeneous liver enhancement and evidence of  portal venous hypertension including recanalized paraumbilical vein, small spleno-renal varices, and small volume ascites. Chronic liver disease strongly suspected although the enhancement pattern might indicate a superimposed acute hepatitis. No evidence of bile duct obstruction. No discrete liver lesion. 2. No obstructive uropathy but absent renal contrast excretion on the delayed images suggesting acute renal insufficiency. Punctate nephrolithiasis. 3. Borderline to mild cardiomegaly. Bilateral lower lobe atelectasis. Mild aortic atherosclerosis. Electronically Signed   By: Odessa Fleming M.D.   On: 01/20/2021 04:46   DG Chest Portable 1 View  Result Date: 01/20/2021 CLINICAL DATA:  Hypoxia EXAM: PORTABLE CHEST 1 VIEW COMPARISON:  04/28/2012 FINDINGS: Lungs are clear.  No pleural effusion or pneumothorax. The heart is normal in size. IMPRESSION: No evidence of acute cardiopulmonary disease. Electronically Signed   By: Charline Bills  M.D.   On: 01/20/2021 03:36   US ABDOMEN LIMITED RUQ (LIVER/GB)  Addendum Date: 01/20/2021   ADDENDUM REPORT: 01/20/2021 02:35 ADDENDUM: In the body of the report the sentence regarding the portal vein should read as follows: Mild patter fugal flow in the portal vein is noted. This is consistent with the underlying findings of early cirrhosis. Electronically Signed   By: Alcide Clever M.D.   On: 01/20/2021 02:35   Result Date: 01/20/2021 CLINICAL DATA:  Jaundice EXAM: ULTRASOUND ABDOMEN LIMITED RIGHT UPPER QUADRANT COMPARISON:  None. FINDINGS: Gallbladder: Gallbladder is well distended with dependent sludge. No wall thickening or pericholecystic fluid is noted. Negative sonographic Murphy's sign is elicited. Common bile duct: Diameter: 5.2 mm. Liver: Diffusely increased in echogenicity. A 9 mm cyst is noted in the right lobe of the liver. Mild nodularity to the liver is noted. Portal vein is patent on color Doppler imaging with normal direction of blood flow towards the liver. Other: Mild perihepatic ascites. IMPRESSION: Changes suggestive of early cirrhosis. No biliary ductal dilatation is seen. Small hepatic cyst. Gallbladder sludge without complicating factors. Electronically Signed: By: Alcide Clever M.D. On: 01/20/2021 01:04     EKG: I have personally reviewed.  Sinus rhythm, QTC 482, low voltage, mild ST depression in precordial leads  Assessment/Plan Principal Problem:   Acute metabolic encephalopathy Active Problems:   Cirrhosis of liver (HCC)   Depression with anxiety   Alcohol abuse   Acute hepatic encephalopathy   Abnormal LFTs   Hyponatremia   UTI (urinary tract infection)   Macrocytic anemia   Leukocytosis   Acute respiratory failure with hypoxia (HCC)   Acute metabolic encephalopathy: Etiology is not clear, likely multifactorial etiology.  Differential diagnosis include UTI, hepatic encephalopathy, hyponatremia.  Patient's ammonia is 35, which does not rule out hepatic  encephalopathy.  CT head is negative for acute intracranial abnormalities.  -Admitted to MedSurg bed as inpatient -Frequent neuro check -IV Rocephin for possible UTI -Start lactulose for possible hepatic encephalopathy  Hepatic encephalopathy: -Lactulose 20 g twice daily  Cirrhosis of liver and abnormal liver function: Most likely due to alcohol abuse -Check hepatitis panel and HIV antibody -Check INR, PTT -Consulted Dr. Norma Fredrickson for GI  Depression with anxiety: Patient's not taking medications currently -Observe closely  Alcohol abuse -Did counsel him on importance of quitting alcohol -CIWA protocol -Started Protonix 40 mg daily for possible alcoholic gastritis  Hyponatremia: Sodium 124, likely due to potomania secondary to alcohol abuse.   - Will check urine sodium, urine osmolality, serum osmolality. - Fluid restriction - IVF: 1L LR in ED, will continue with IV normal saline at 75 mL/h - f/u by BMP q8h - avoid over correction  too fast due to risk of central pontine myelinolysis  Possible UTI (urinary tract infection) -IV Rocephin -Follow-up urine culture  Macrocytic anemia: Hemoglobin 8.4 (of 14.2 on 08/01/2019) patient denies dark stool or rectal bleeding -Check anemia panel -Check FOBT  Leukocytosis: Likely due to UTI. F/u blood culture  Acute respiratory failure with hypoxia [facility]): Oxygen desaturated to 88% on room air, currently 95% on 2 L oxygen.  Patient denies shortness of breath or chest pain, cough.  Chest x-ray negative.  Etiology is not clear, may be due to altered mental status. -As needed albuterol -Nasal cannula oxygen to maintain oxygen saturation above 93%         DVT ppx: SCD Code Status: Full code Family Communication:   Yes, patient's husband at bed side Disposition Plan:  Anticipate discharge back to previous environment Consults called:  Dr. Norma Fredrickson of GI Admission status and Level of care: Med-Surg:     as inpt       Status is:  Inpatient  Remains inpatient appropriate because: Patient has multiple acute presentation as listed above, which are highly complicated.  Patient is at high risk of deteriorating.  Will need to be treated in the hospital for at least 2 days               Date of Service 01/20/2021    Lorretta Harp [facility]   If 7PM-7AM, please contact night-coverage [email or web address] 01/20/2021, 5:56 PM

## 2021-01-20 NOTE — ED Notes (Signed)
Pt placed on 2L oxygen due to oxygen RA at 88%. MD made aware.

## 2021-01-20 NOTE — ED Provider Notes (Signed)
[facility] Emergency Department Provider Note  ____________________________________________  Time seen: Approximately 1:24 AM  I have reviewed the triage vital signs and the nursing notes.   HISTORY  Chief Complaint Altered Mental Status and Abdominal Pain   HPI [name] Carrillo is a 55 y.o. female with a history of anxiety and depression who presents for evaluation of altered mental status.  Patient is accompanied by her husband.  History is gathered from both of them.  According to them patient has had anorexia, on and off abdominal pain and bloating since the summer.  Over the last week she has been confused and her husband noticed that her eyes were very yellow.  Patient has a history of former alcohol abuse.  She reports that she continues to drink but maybe one or 2 drinks a day.  No history of hepatitis.  Patient does report some bloating of her stomach and discomfort in the upper abdominal area which has been ongoing.  No vomiting or diarrhea, no fever or chills, no chest pain or shortness of breath.   Past Medical History:  Diagnosis Date   Anxiety    Depression     Patient Active Problem List   Diagnosis Date Noted   Tibia fracture 02/12/2017    Past Surgical History:  Procedure Laterality Date   APPLICATION OF WOUND VAC Left 03/08/2017   Procedure: APPLICATION OF WOUND VAC;  Surgeon: Leim Fabry, MD;  Location: [facility];  Service: Orthopedics;  Laterality: Left;   DEBRIDEMENT AND CLOSURE WOUND Left 04/03/2017   Procedure: DEBRIDEMENT AND CLOSURE WOUND;  Surgeon: Wallace Going, DO;  Location: [facility];  Service: Plastics;  Laterality: Left;   HARDWARE REMOVAL Left 03/08/2017   Procedure: HARDWARE REMOVAL-LEFT LEG AND ANKLE;  Surgeon: Leim Fabry, MD;  Location: [facility];  Service: Orthopedics;  Laterality: Left;   right leg surgery Right 2014   .IM nailing right leg. screws and rod inserted.   TIBIA IM NAIL INSERTION  Left 02/12/2017   Procedure: INTRAMEDULLARY (IM) NAIL TIBIAL;  Surgeon: Leim Fabry, MD;  Location: [facility];  Service: Orthopedics;  Laterality: Left;   TUBAL LIGATION  11/2002    Prior to Admission medications   Medication Sig Start Date End Date Taking? Authorizing Provider  naproxen sodium (ALEVE) 220 MG tablet Take 440-660 mg by mouth 2 (two) times daily as needed (pain).     [provider]  oxyCODONE (OXY IR/ROXICODONE) 5 MG immediate release tablet Take 1-2 tablets (5-10 mg total) by mouth every 4 (four) hours as needed for severe pain or breakthrough pain. 03/09/17   Reche Dixon, PA-C  sodium chloride flush (NS) 0.9 % SOLN 10-40 mLs by Intracatheter route as needed (flush). 03/10/17   Reche Dixon, PA-C    Allergies Patient has no known allergies.  No family history on file.  Social History Social History   Tobacco Use   Smoking status: Former    Packs/day: 0.50    Types: Cigarettes    Quit date: 05/04/2016    Years since quitting: 4.7   Smokeless tobacco: Never  Vaping Use   Vaping Use: Never used  Substance Use Topics   Alcohol use: Yes   Drug use: No    Review of Systems  Constitutional: Negative for fever. + confusion Eyes: Negative for visual changes. + yellow eyes ENT: Negative for sore throat. Neck: No neck pain  Cardiovascular: Negative for chest pain. Respiratory: Negative for shortness of breath. Gastrointestinal: Negative for  abdominal pain, vomiting or diarrhea. + abd bloating Genitourinary: Negative for dysuria. Musculoskeletal: Negative for back pain. Skin: Negative for rash. Neurological: Negative for headaches, weakness or numbness. Psych: No SI or HI  ____________________________________________   PHYSICAL EXAM:  VITAL SIGNS: ED Triage Vitals  Enc Vitals Group     BP 01/19/21 1337 109/78     Pulse Rate 01/19/21 1337 85     Resp 01/19/21 1337 16     Temp 01/19/21 1337 97.8 F (36.6 C)     Temp Source 01/19/21 1337 Oral      SpO2 01/19/21 1337 100 %     Weight 01/19/21 1338 180 lb (81.6 kg)     Height 01/19/21 1338 '5\' 5"'  (1.651 m)     Head Circumference --      Peak Flow --      Pain Score 01/19/21 1338 5     Pain Loc --      Pain Edu? --      Excl. in [place]? --     Constitutional: Alert and oriented, in no apparent distress. HEENT:      Head: Normocephalic and atraumatic.         Eyes: Conjunctivae are normal. Sclera is icteric.       Mouth/Throat: Mucous membranes are moist.       Neck: Supple with no signs of meningismus. Cardiovascular: Regular rate and rhythm. No murmurs, gallops, or rubs. 2+ symmetrical distal pulses are present in all extremities. No JVD. Respiratory: Normal respiratory effort. Lungs are clear to auscultation bilaterally.  Gastrointestinal: Soft, hepatomegaly, and non distended with positive bowel sounds. No rebound or guarding. Genitourinary: No CVA tenderness. Musculoskeletal:  No edema, cyanosis, or erythema of extremities. Neurologic: Normal speech and language. Face is symmetric. Moving all extremities. No gross focal neurologic deficits are appreciated. Skin: Skin is warm, dry and intact. No rash noted. Jaundiced Psychiatric: Mood and affect are normal. Speech and behavior are normal.  ____________________________________________   LABS (all labs ordered are listed, but only abnormal results are displayed)  Labs Reviewed  LIPASE, BLOOD - Abnormal; Notable for the following components:      Result Value   Lipase 54 (*)    All other components within normal limits  COMPREHENSIVE METABOLIC PANEL - Abnormal; Notable for the following components:   Sodium 124 (*)    Chloride 81 (*)    Glucose, Bld 116 (*)    BUN 26 (*)    Albumin 2.6 (*)    AST 371 (*)    ALT 91 (*)    Alkaline Phosphatase 195 (*)    Total Bilirubin 16.0 (*)    All other components within normal limits  CBC - Abnormal; Notable for the following components:   WBC 12.9 (*)    RBC 1.88 (*)     Hemoglobin 8.4 (*)    HCT 23.9 (*)    MCV 127.1 (*)    MCH 44.7 (*)    RDW 19.6 (*)    nRBC 0.4 (*)    All other components within normal limits  URINALYSIS, COMPLETE (UACMP) WITH MICROSCOPIC - Abnormal; Notable for the following components:   Color, Urine AMBER (*)    APPearance HAZY (*)    Bilirubin Urine SMALL (*)    Ketones, ur 5 (*)    Bacteria, UA MANY (*)    All other components within normal limits  ACETAMINOPHEN LEVEL - Abnormal; Notable for the following components:   Acetaminophen (Tylenol), Serum <10 (*)  All other components within normal limits  SALICYLATE LEVEL - Abnormal; Notable for the following components:   Salicylate Lvl <1.6 (*)    All other components within normal limits  PROTIME-INR - Abnormal; Notable for the following components:   Prothrombin Time 16.0 (*)    INR 1.3 (*)    All other components within normal limits  RESP PANEL BY RT-PCR (FLU A&B, COVID) ARPGX2  AMMONIA  TROPONIN I (HIGH SENSITIVITY)   ____________________________________________  EKG  ED ECG REPORT I, Rudene Re, the attending physician, personally viewed and interpreted this ECG.  Sinus rhythm with a rate of 80, normal intervals, normal axis, diffuse T wave flattening with no ST elevations. ____________________________________________  RADIOLOGY  I have personally reviewed the images performed during this visit and I agree with the Radiologist's read.   Interpretation by Radiologist:  CT HEAD WO CONTRAST (5MM)  Result Date: 01/20/2021 CLINICAL DATA:  55 year old female with altered mental status, confusion. Jaundice. Upper abdominal pain. EXAM: CT HEAD WITHOUT CONTRAST TECHNIQUE: Contiguous axial images were obtained from the base of the skull through the vertex without intravenous contrast. COMPARISON:  None. FINDINGS: Brain: No midline shift, ventriculomegaly, mass effect, evidence of mass lesion, intracranial hemorrhage or evidence of cortically based acute  infarction. Gray-white matter differentiation is within normal limits throughout the brain. Vascular: Mild Calcified atherosclerosis at the skull base. No suspicious intracranial vascular hyperdensity. Skull: Negative. Sinuses/Orbits: Visualized paranasal sinuses and mastoids are clear. Other: Visualized orbits and scalp soft tissues are within normal limits. IMPRESSION: Normal noncontrast Head CT. Electronically Signed   By: Genevie Ann M.D.   On: 01/20/2021 04:37   CT ABDOMEN PELVIS W CONTRAST  Result Date: 01/20/2021 CLINICAL DATA:  55 year old female with altered mental status, confusion. Jaundice. Upper abdominal pain. EXAM: CT ABDOMEN AND PELVIS WITH CONTRAST TECHNIQUE: Multidetector CT imaging of the abdomen and pelvis was performed using the standard protocol following bolus administration of intravenous contrast. CONTRAST:  152m OMNIPAQUE IOHEXOL 300 MG/ML  SOLN COMPARISON:  Right upper quadrant ultrasound yesterday. FINDINGS: Lower chest: Borderline to mild cardiomegaly. No pericardial effusion. Confluent but enhancing lung base opacity in keeping with atelectasis. No pleural effusion. Hepatobiliary: Geographic type heterogeneous enhancement of the liver, with hepatomegaly. Liver length is 20 cm plus. Trace perihepatic free fluid. Recanalized paraumbilical vein. No discrete liver lesion. Gallbladder is within normal limits. No biliary ductal dilatation. Pancreas: Negative. Spleen: Trace perisplenic fluid. Estimated splenic volume 300 mL (normal splenic volume range 83 - 412 mL). No discrete splenic lesion. Adrenals/Urinary Tract: Adrenal glands are within normal limits. Kidneys are nonobstructed. There is punctate nephrolithiasis primarily on the right. However, on the delayed images there is no significant renal contrast excretion. The ureters are decompressed. Diminutive, unremarkable bladder. In Stomach/Bowel: Oral contrast type material mixed with stool in the large bowel which is mostly decompressed  throughout. The cecum is decompressed. Appendix remains within normal limits arising on coronal image 45. No convincing large bowel inflammation. No dilated small bowel. Stomach is decompressed with possible small gastric hiatal hernia. Small duodenum diverticulum in the midline with no active inflammation. No free air. Only trace free fluid in the abdomen. Vascular/Lymphatic: Mild Aortoiliac calcified atherosclerosis. Major arterial structures remain patent. Portal venous system is patent. There is a recanalized paraumbilical vein (series 2, image 26) and there are small spleno renal varices. No lymphadenopathy. Reproductive: Negative. Other: Small volume pelvic free fluid with simple fluid density (series 2, image 75). Musculoskeletal: Negative. IMPRESSION: 1. Hepatomegaly with heterogeneous liver enhancement and evidence  of portal venous hypertension including recanalized paraumbilical vein, small spleno-renal varices, and small volume ascites. Chronic liver disease strongly suspected although the enhancement pattern might indicate a superimposed acute hepatitis. No evidence of bile duct obstruction. No discrete liver lesion. 2. No obstructive uropathy but absent renal contrast excretion on the delayed images suggesting acute renal insufficiency. Punctate nephrolithiasis. 3. Borderline to mild cardiomegaly. Bilateral lower lobe atelectasis. Mild aortic atherosclerosis. Electronically Signed   By: Genevie Ann M.D.   On: 01/20/2021 04:46   DG Chest Portable 1 View  Result Date: 01/20/2021 CLINICAL DATA:  Hypoxia EXAM: PORTABLE CHEST 1 VIEW COMPARISON:  04/28/2012 FINDINGS: Lungs are clear.  No pleural effusion or pneumothorax. The heart is normal in size. IMPRESSION: No evidence of acute cardiopulmonary disease. Electronically Signed   By: Julian Hy M.D.   On: 01/20/2021 03:36   US ABDOMEN LIMITED RUQ (LIVER/GB)  Addendum Date: 01/20/2021   ADDENDUM REPORT: 01/20/2021 02:35 ADDENDUM: In the body of  the report the sentence regarding the portal vein should read as follows: Mild patter fugal flow in the portal vein is noted. This is consistent with the underlying findings of early cirrhosis. Electronically Signed   By: Inez Catalina M.D.   On: 01/20/2021 02:35   Result Date: 01/20/2021 CLINICAL DATA:  Jaundice EXAM: ULTRASOUND ABDOMEN LIMITED RIGHT UPPER QUADRANT COMPARISON:  None. FINDINGS: Gallbladder: Gallbladder is well distended with dependent sludge. No wall thickening or pericholecystic fluid is noted. Negative sonographic Murphy's sign is elicited. Common bile duct: Diameter: 5.2 mm. Liver: Diffusely increased in echogenicity. A 9 mm cyst is noted in the right lobe of the liver. Mild nodularity to the liver is noted. Portal vein is patent on color Doppler imaging with normal direction of blood flow towards the liver. Other: Mild perihepatic ascites. IMPRESSION: Changes suggestive of early cirrhosis. No biliary ductal dilatation is seen. Small hepatic cyst. Gallbladder sludge without complicating factors. Electronically Signed: By: Inez Catalina M.D. On: 01/20/2021 01:04     ____________________________________________   PROCEDURES  Procedure(s) performed:yes .1-3 Lead EKG Interpretation Performed by: Rudene Re, MD Authorized by: Rudene Re, MD     Interpretation: non-specific     ECG rate assessment: normal     Rhythm: sinus rhythm     Ectopy: none     Conduction: normal     Critical Care performed: yes  CRITICAL CARE Performed by: Rudene Re  ?  Total critical care time: 30 min  Critical care time was exclusive of separately billable procedures and treating other patients.  Critical care was necessary to treat or prevent imminent or life-threatening deterioration.  Critical care was time spent personally by me on the following activities: development of treatment plan with patient and/or surrogate as well as nursing, discussions with consultants,  evaluation of patient's response to treatment, examination of patient, obtaining history from patient or surrogate, ordering and performing treatments and interventions, ordering and review of laboratory studies, ordering and review of radiographic studies, pulse oximetry and re-evaluation of patient's condition.  ____________________________________________   INITIAL IMPRESSION / ASSESSMENT AND PLAN / ED COURSE  55 y.o. female with a history of anxiety and depression who presents for evaluation of altered mental status.  Patient is in no distress but jaundice with scleral icterus, otherwise neurologically intact with normal vital signs.  History of alcohol abuse.  History is gathered from patient and her husband who was at bedside.  I did review patient's medical chart and which shows no signs of known cirrhosis of  the liver.  Her blood work shows T bili of 16, AST of 371 with ALT of 91 and alk phos of 195.  Also mild hyponatremia with sodium of 124.  Lipase borderline normal.  CBC showing mild anemia with hemoglobin of 8.4, normal platelets and a white count of 12.9.  Ammonia is normal with no signs of hepatic encephalopathy.  At this time presentation is concerning for cirrhosis of the liver possibly from alcohol abuse versus pancreatic mass versus hepatitis versus hepatocellular carcinoma.  Confusion is most likely due to elevated bilirubin, low sodium.  We will get a head CT and CT abdomen pelvis.  We will start IV hydration.  We will get coags to evaluate liver function.  Dissipate admission to the hospitalist service.  Patient placed on telemetry for monitoring of cardiorespiratory status with normal sinus rhythm on monitor.  _________________________ 4:54 AM on 01/20/2021 -----------------------------------------  INR is slightly elevated at 1.3 and imaging studies visualized by me and read by radiology.  CT head with no acute findings.  CT of the abdomen consistent with cirrhosis of the liver  with evidence of portal venous hypertension and splenorenal varices.  We will discussed with the hospitalist for admission.  Patient slightly hypoxic with sats of 89%.  Chest x-ray was done showing no signs of pneumonia.  She does have some mild cardiomegaly on CT.  Will most likely need an echo.       _____________________________________________ Please note:  Patient was evaluated in Emergency Department today for the symptoms described in the history of present illness. Patient was evaluated in the context of the global COVID-19 pandemic, which necessitated consideration that the patient might be at risk for infection with the SARS-CoV-2 virus that causes COVID-19. Institutional protocols and algorithms that pertain to the evaluation of patients at risk for COVID-19 are in a state of rapid change based on information released by regulatory bodies including the CDC and federal and state organizations. These policies and algorithms were followed during the patient's care in the ED.  Some ED evaluations and interventions may be delayed as a result of limited staffing during the pandemic.   [place] Controlled Substance Database was reviewed by me. ____________________________________________   FINAL CLINICAL IMPRESSION(S) / ED DIAGNOSES   Final diagnoses:  Cirrhosis of liver with ascites, unspecified hepatic cirrhosis type ([place])  Altered mental status, unspecified altered mental status type  Hypoxia      NEW MEDICATIONS STARTED DURING THIS VISIT:  ED Discharge Orders     None        Note:  This document was prepared using Dragon voice recognition software and may include unintentional dictation errors.    Rudene Re, MD 01/20/21 [phone]

## 2021-01-20 NOTE — ED Notes (Signed)
Informed RN bed assigned 

## 2021-01-21 ENCOUNTER — Inpatient Hospital Stay: Payer: BC Managed Care – PPO

## 2021-01-21 DIAGNOSIS — G9341 Metabolic encephalopathy: Secondary | ICD-10-CM | POA: Diagnosis not present

## 2021-01-21 LAB — BASIC METABOLIC PANEL
Anion gap: 8 (ref 5–15)
BUN: 17 mg/dL (ref 6–20)
CO2: 29 mmol/L (ref 22–32)
Calcium: 8.6 mg/dL — ABNORMAL LOW (ref 8.9–10.3)
Chloride: 94 mmol/L — ABNORMAL LOW (ref 98–111)
Creatinine, Ser: 0.54 mg/dL (ref 0.44–1.00)
GFR, Estimated: >60 mL/min (ref >60)
Glucose, Bld: 111 mg/dL — ABNORMAL HIGH (ref 70–99)
Potassium: 3.3 mmol/L — ABNORMAL LOW (ref 3.5–5.1)
Sodium: 131 mmol/L — ABNORMAL LOW (ref 135–145)

## 2021-01-21 IMAGING — US US ABDOMEN LIMITED
1 series · 9 of 9 positions shown · non-contrast
Comparison: CT of the abdomen and pelvis on [DATE]

CLINICAL DATA: Assessment for ascites.

EXAM:
LIMITED ABDOMEN ULTRASOUND FOR ASCITES
TECHNIQUE: Limited ultrasound survey for ascites was performed in all four
abdominal quadrants.

[Series 1: us abdomen limited · 0.30mm/px · 9 of 9 slices shown]
[im 1/9]
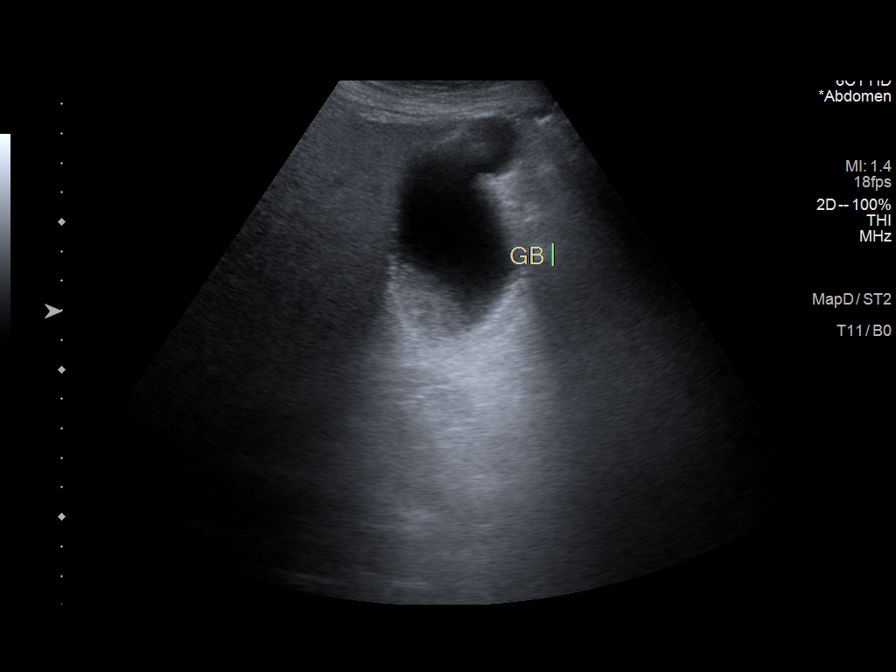
[im 2/9]
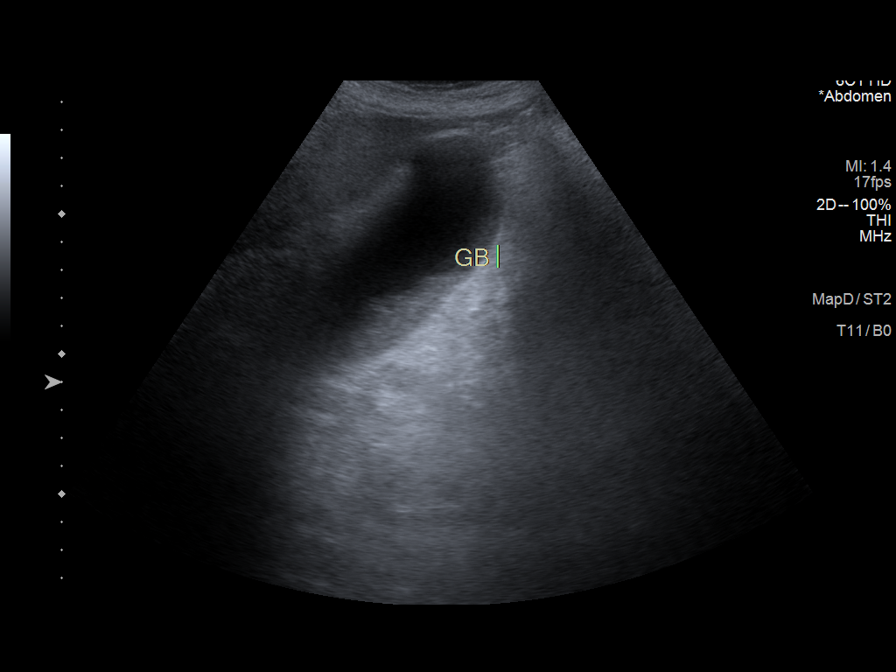
[im 3/9]
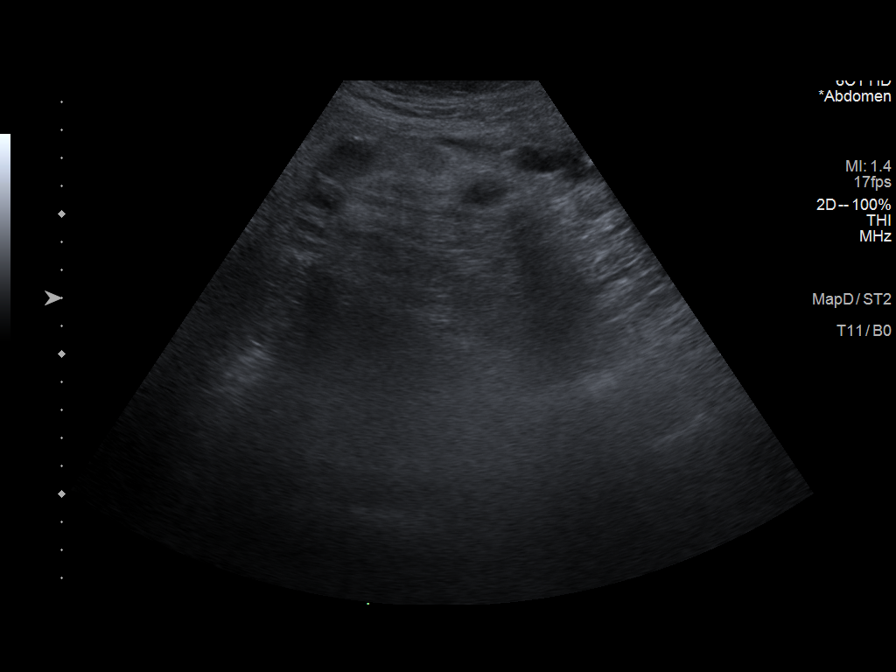
[im 4/9]
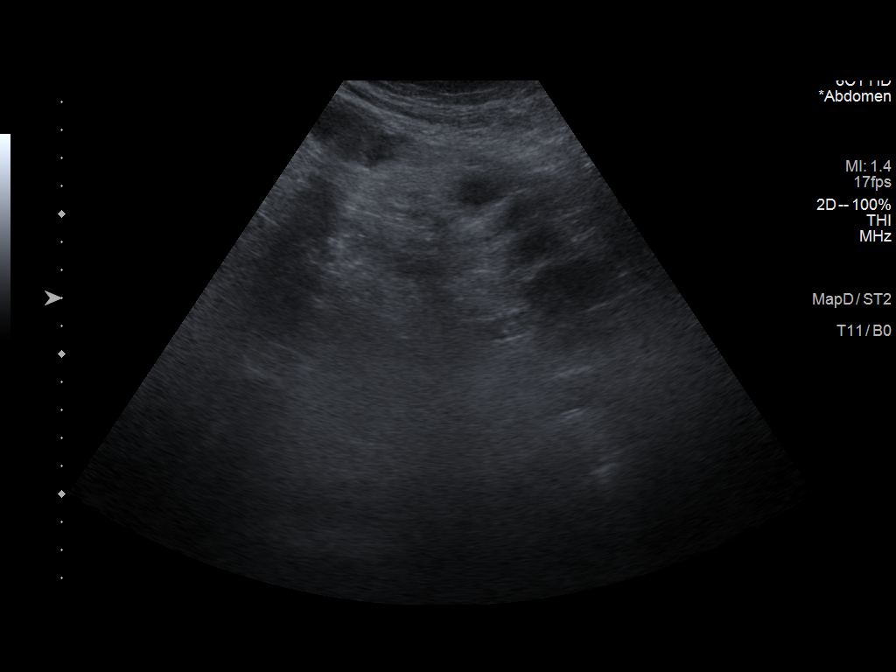
[im 5/9]
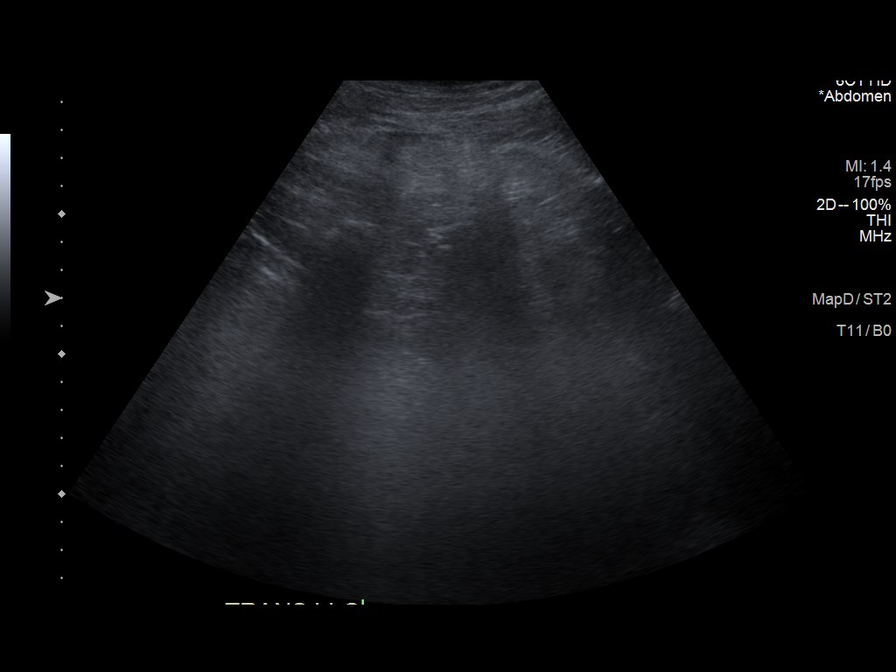
[im 6/9]
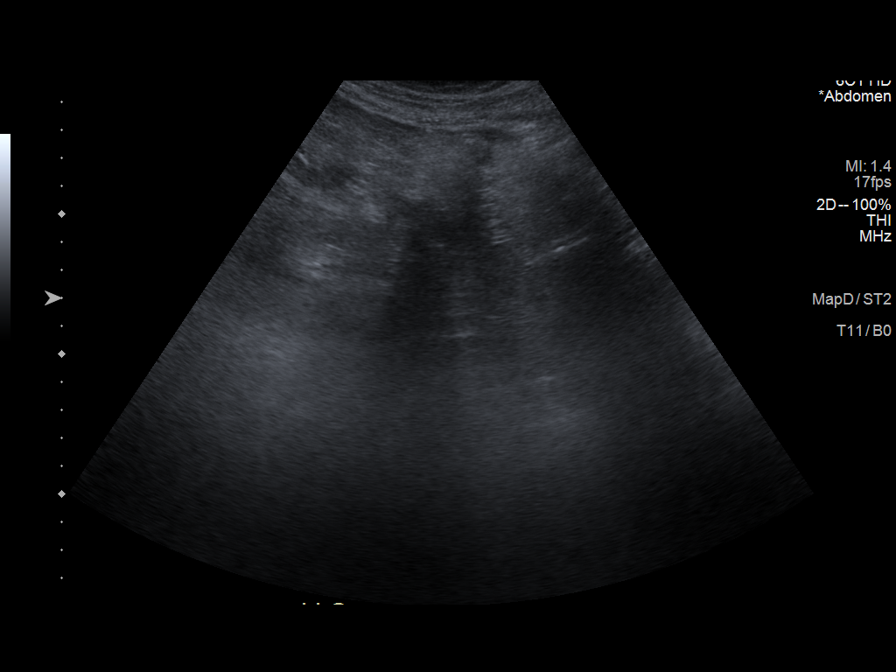
[im 7/9]
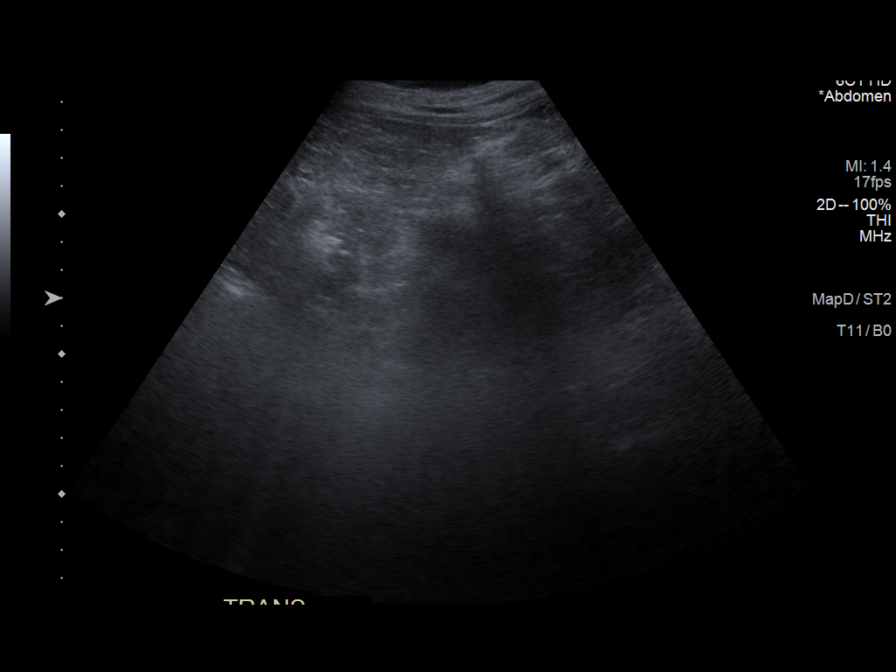
[im 8/9]
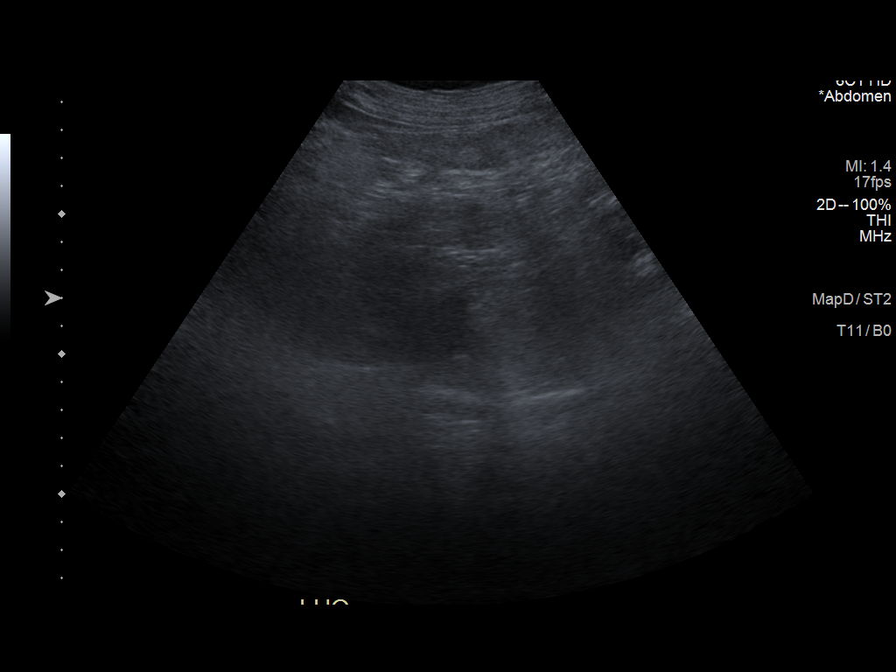
[im 9/9]
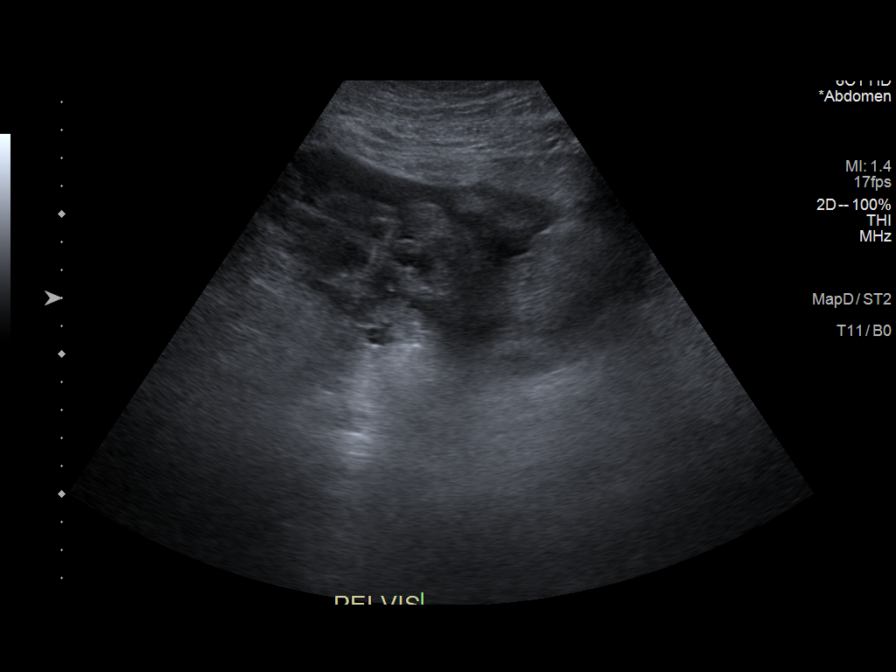

[9 of 9 positions shown; findings below may reference images not displayed]

FINDINGS: Survey of the peritoneal cavity demonstrates only a trace amount of
ascites in the lower pelvis. No accessible pocket of ascites is
identified for paracentesis.
IMPRESSION: Trace ascites in the pelvis. No accessible pocket of fluid for
paracentesis.

## 2021-01-21 MED ORDER — MAGNESIUM SULFATE 2 GM/50ML IV SOLN
2.0000 g | Freq: Once | INTRAVENOUS | Status: AC
  Administered 2021-01-21: 2 g via INTRAVENOUS
  Filled 2021-01-21: qty 50

## 2021-01-21 MED ORDER — THIAMINE HCL 100 MG/ML IJ SOLN
500.0000 mg | Freq: Three times a day (TID) | INTRAVENOUS | Status: AC
  Administered 2021-01-21 – 2021-01-24 (×9): 500 mg via INTRAVENOUS
  Filled 2021-01-21 (×9): qty 5

## 2021-01-21 MED ORDER — DIAZEPAM 5 MG PO TABS
10.0000 mg | ORAL_TABLET | Freq: Three times a day (TID) | ORAL | Status: DC
  Administered 2021-01-21: 10 mg via ORAL
  Filled 2021-01-21: qty 2

## 2021-01-21 MED ORDER — POTASSIUM CHLORIDE CRYS ER 20 MEQ PO TBCR
40.0000 meq | EXTENDED_RELEASE_TABLET | ORAL | Status: AC
  Administered 2021-01-21 (×2): 40 meq via ORAL
  Filled 2021-01-21 (×2): qty 2

## 2021-01-21 NOTE — TOC Initial Note (Signed)
Transition of Care [facility]) - Initial/Assessment Note    Patient Details  Name: [name] Carrillo MRN: [id number] Date of Birth: Sep 21, 1965  Transition of Care [facility]) CM/SW Contact:    Margarito Liner, LCSW Phone Number: 01/21/2021, 12:27 PM  Clinical Narrative:   CSW contacted patient, introduced role, and inquired about interest in SA resources. Patietn declined. Encouraged her to notify nurse if she changes her mind. No further concerns. CSW will continue to follow patient for support and facilitate return home when stable.              Expected Discharge Plan: Home/Self Care Barriers to Discharge: Continued Medical Work up   Patient Goals and CMS Choice        Expected Discharge Plan and Services Expected Discharge Plan: Home/Self Care     Post Acute Care Choice: NA Living arrangements for the past 2 months: Single Family Home                                      Prior Living Arrangements/Services Living arrangements for the past 2 months: Single Family Home Lives with:: Spouse Patient language and need for interpreter reviewed:: Yes Do you feel safe going back to the place where you live?: Yes      Need for Family Participation in Patient Care: Yes (Comment) Care giver support system in place?: Yes (comment)   Criminal Activity/Legal Involvement Pertinent to Current Situation/Hospitalization: No - Comment as needed  Activities of Daily Living Home Assistive Devices/Equipment: Eyeglasses ADL Screening (condition at time of admission) Patient's cognitive ability adequate to safely complete daily activities?: Yes Is the patient deaf or have difficulty hearing?: No Does the patient have difficulty seeing, even when wearing glasses/contacts?: No Does the patient have difficulty concentrating, remembering, or making decisions?: No Patient able to express need for assistance with ADLs?: Yes Does the patient have difficulty dressing or bathing?: No Independently  performs ADLs?: Yes (appropriate for developmental age) Does the patient have difficulty walking or climbing stairs?: No Weakness of Legs: None Weakness of Arms/Hands: None  Permission Sought/Granted                  Emotional Assessment Appearance:: Appears stated age Attitude/Demeanor/Rapport: Engaged Affect (typically observed): Appropriate, Calm Orientation: : Oriented to Self, Oriented to Place, Oriented to  Time, Oriented to Situation Alcohol / Substance Use: Alcohol Use Psych Involvement: No (comment)  Admission diagnosis:  Cirrhosis of liver (HCC) [K74.60] Hypoxia [R09.02] Cirrhosis of liver with ascites, unspecified hepatic cirrhosis type (HCC) [K74.60, R18.8] Altered mental status, unspecified altered mental status type [R41.82] Acute metabolic encephalopathy [G93.41] Patient Active Problem List   Diagnosis Date Noted   Cirrhosis of liver (HCC) 01/20/2021   Acute metabolic encephalopathy 01/20/2021   Depression with anxiety 01/20/2021   Alcohol abuse 01/20/2021   Acute hepatic encephalopathy 01/20/2021   Abnormal LFTs 01/20/2021   Hyponatremia 01/20/2021   UTI (urinary tract infection) 01/20/2021   Macrocytic anemia 01/20/2021   Leukocytosis 01/20/2021   Acute respiratory failure with hypoxia (HCC) 01/20/2021   Tibia fracture 02/12/2017   PCP:  Patient, No Pcp Per (Inactive) Pharmacy:   [id number] [place], [place] - [address] AT [facility] [address] [place] [address] Phone: [phone] Fax: [phone]     Social Determinants of Health (SDOH) Interventions    Readmission Risk Interventions  No flowsheet data found.

## 2021-01-21 NOTE — Progress Notes (Signed)
Patient ID: [name] Carrillo, female   DOB: [birth year], 55 y.o.   MRN: [id number]   01/21/21 1800  CIWA-Ar  Nausea and Vomiting 0  Tactile Disturbances 2  Tremor 3  Auditory Disturbances 0  Paroxysmal Sweats 1  Visual Disturbances 0  Anxiety 4  Headache, Fullness in Head 0  Agitation 4  Orientation and Clouding of Sensorium 0  CIWA-Ar Total 14   MD made aware of change in [place] scores. Instructed to utilize prn ativan and a new order fro scheduled valium attained.  Lidia Collum, RN

## 2021-01-21 NOTE — Progress Notes (Signed)
PROGRESS NOTE    [name] Carrillo  BJS:[id number] DOB: 08/16/1965 DOA: 01/20/2021 PCP: Patient, No Pcp Per (Inactive)    Brief Narrative:   55 y.o. female with medical history significant of depression with anxiety, alcohol abuse, who presented with altered mental status, jaundice, abdominal pain.   Per her husband at the bedside, patient has been intermittently confused for almost a week.  Patient moves all extremities normally.  No facial droop or slurred speech.  She has malaise and weakness.  She has jaundice with yellow eyes.  Patient has mild pain in upper abdomen.  Denies nausea, vomiting or diarrhea.  Denies dark stool or rectal bleeding.  Patient denies chest pain, cough, shortness breath.  Patient states that she has difficulty urinating, she states she is not sure if she has burning or dysuria. When I saw patient in ED, patient is lethargic, but orientated x3.  She moves all extremities normally  Started on lactulose with marked improvement in encephalopathy and lethargy as well as weakness.  Seen in consultation by gastroenterology.  Recommendations appreciated.   Assessment & Plan:   Principal Problem:   Acute metabolic encephalopathy Active Problems:   Cirrhosis of liver (HCC)   Depression with anxiety   Alcohol abuse   Acute hepatic encephalopathy   Abnormal LFTs   Hyponatremia   UTI (urinary tract infection)   Macrocytic anemia   Leukocytosis   Acute respiratory failure with hypoxia (HCC)  Acute metabolic encephalopathy Hepatic encephalopathy Improved Suspected hepatic encephalopathy CT head negative Difficult to exclude UTI as contributing factor Plan: Continue lactulose, titrate to 2-4 soft BM daily Continue IV Rocephin for possible UTI, limit to 3 days Appreciate GI follow-up and education regarding pathophysiology of cirrhosis Anticipated date of discharge 10/21    Cirrhosis of liver and abnormal liver function Most likely due to alcohol abuse No  accessible fluid pocket for safe paracentesis Plan: No indication for steroids Educate on alcohol abuse   Depression with anxiety Patient's not taking medications currently -Observe closely   Alcohol abuse -Did counsel him on importance of quitting alcohol -Continue CIWA protocol -Continue daily PPI   Hyponatremia  Sodium 124 on admission, suspect beer potomania Improved   Possible UTI (urinary tract infection) -IV Rocephin -Follow-up urine culture   Macrocytic anemia Hemoglobin 8.4 (of 14.2 on 08/01/2019) patient denies dark stool or rectal bleeding No indication for transfusion at this time Monitor H&H, transfuse for hemoglobin less than 7  Acute respiratory failure with hypoxia (HCC)  Oxygen desaturated to 88% on room air, currently 95% on 2 L oxygen.   Patient denies shortness of breath or chest pain, cough.   Chest x-ray negative.  Etiology is not clear, may be due to altered mental status. Plan: As needed nebulizer Wean oxygen as tolerated   DVT prophylaxis: SCD Code Status: Full code Family Communication: Joycelyn Man spouse [phone] on 10/20 Disposition Plan: Status is: Inpatient  Remains inpatient appropriate because: Acute alcoholic hepatitis/hepatic encephalopathy.  Will monitor for next 24 hours.  Possible discharge on 10/21.       Level of care: Med-Surg  Consultants:  GI  Procedures:  None  Antimicrobials: Ceftriaxone   Subjective: Patient seen and examined.  Reports marked improvement in lethargy, weakness, mental status over the past 24 hours  Objective: Vitals:   01/20/21 1934 01/20/21 2019 01/21/21 0334 01/21/21 0756  BP: (!) 102/50 (!) 107/53 (!) 101/54 100/60  Pulse: 84 85 81 77  Resp: 20  16 17   Temp: 98 F (36.7  C) 98.3 F (36.8 C) 98.2 F (36.8 C) 98 F (36.7 C)  TempSrc: Oral Oral Oral Oral  SpO2: 97% 93% (!) 88% 94%  Weight:      Height:        Intake/Output Summary (Last 24 hours) at 01/21/2021 1406 Last  data filed at 01/21/2021 1028 Gross per 24 hour  Intake 863.75 ml  Output --  Net 863.75 ml   Filed Weights   01/19/21 1338  Weight: 81.6 kg    Examination:  General exam: Appears calm and comfortable  Respiratory system: Clear to auscultation. Respiratory effort normal. Cardiovascular system: S1-S2, RRR, no murmurs, no pedal edema Gastrointestinal system: Soft, nontender, mild distention, positive bowel sounds Central nervous system: Alert and oriented. No focal neurological deficits. Extremities: Symmetric 5 x 5 power. Skin: Mildly icteric Psychiatry: Judgement and insight appear normal. Mood & affect appropriate.     Data Reviewed: I have personally reviewed following labs and imaging studies  CBC: Recent Labs  Lab 01/19/21 1341  WBC 12.9*  HGB 8.4*  HCT 23.9*  MCV 127.1*  PLT 150   Basic Metabolic Panel: Recent Labs  Lab 01/19/21 1341 01/20/21 0955 01/20/21 1526 01/20/21 2316 01/21/21 0722  NA 124* 129* 128* 130* 131*  K 3.9 3.4* 3.3* 3.1* 3.3*  CL 81* 88* 88* 90* 94*  CO2 28 30 29 29 29   GLUCOSE 116* 114* 140* 109* 111*  BUN 26* 21* 20 18 17   CREATININE 0.75 0.58 0.54 0.54 0.54  CALCIUM 9.9 9.1 9.1 8.8* 8.6*  MG  --  1.7  --   --   --   PHOS  --  2.7  --   --   --    GFR: Estimated Creatinine Clearance: 82.8 mL/min (by C-G formula based on SCr of 0.54 mg/dL). Liver Function Tests: Recent Labs  Lab 01/19/21 1341  AST 371*  ALT 91*  ALKPHOS 195*  BILITOT 16.0*  PROT 7.6  ALBUMIN 2.6*   Recent Labs  Lab 01/19/21 1341  LIPASE 54*   Recent Labs  Lab 01/19/21 1341  AMMONIA 35   Coagulation Profile: Recent Labs  Lab 01/20/21 0240  INR 1.3*   Cardiac Enzymes: No results for input(s): CKTOTAL, CKMB, CKMBINDEX, TROPONINI in the last 168 hours. BNP (last 3 results) No results for input(s): PROBNP in the last 8760 hours. HbA1C: No results for input(s): HGBA1C in the last 72 hours. CBG: No results for input(s): GLUCAP in the last 168  hours. Lipid Profile: No results for input(s): CHOL, HDL, LDLCALC, TRIG, CHOLHDL, LDLDIRECT in the last 72 hours. Thyroid Function Tests: No results for input(s): TSH, T4TOTAL, FREET4, T3FREE, THYROIDAB in the last 72 hours. Anemia Panel: Recent Labs    01/20/21 0955 01/20/21 1019  VITAMINB12 694  --   FOLATE 2.0*  --   FERRITIN 1,999*  --   TIBC 136*  --   IRON 123  --   RETICCTPCT  --  6.3*   Sepsis Labs: No results for input(s): PROCALCITON, LATICACIDVEN in the last 168 hours.  Recent Results (from the past 240 hour(s))  Urine Culture     Status: None (Preliminary result)   Collection Time: 01/19/21 11:40 PM   Specimen: Urine, Clean Catch  Result Value Ref Range Status   Specimen Description   Final    URINE, CLEAN CATCH Performed at [facility], [address] [place], [address] [place]    Special Requests   Final    NONE Performed at [facility], [address] [place], [place] [address]    Culture   Final    CULTURE REINCUBATED FOR BETTER GROWTH Performed at [facility], [address], [place], [place] [address]    Report Status PENDING  Incomplete  Resp Panel by RT-PCR (Flu A&B, Covid) Nasopharyngeal Swab     Status: None   Collection Time: 01/20/21  1:43 AM   Specimen: Nasopharyngeal Swab; Nasopharyngeal(NP) swabs in vial transport medium  Result Value Ref Range Status   SARS Coronavirus 2 by RT PCR NEGATIVE NEGATIVE Final    Comment: (NOTE) SARS-CoV-2 target nucleic acids are NOT DETECTED.  The SARS-CoV-2 RNA is generally detectable in upper respiratory specimens during the acute phase of infection. The lowest concentration of SARS-CoV-2 viral copies this assay can detect is 138 copies/mL. A negative result does not preclude SARS-Cov-2 infection and should not be used as the sole basis for treatment or other patient management decisions. A negative result may occur with  improper specimen collection/handling, submission of  specimen other than nasopharyngeal swab, presence of viral mutation(s) within the areas targeted by this assay, and inadequate number of viral copies(<138 copies/mL). A negative result must be combined with clinical observations, patient history, and epidemiological information. The expected result is Negative.  Fact Sheet for Patients:  BloggerCourse.com  Fact Sheet for Healthcare Providers:  SeriousBroker.it  This test is no t yet approved or cleared by the [place] FDA and  has been authorized for detection and/or diagnosis of SARS-CoV-2 by FDA under an Emergency Use Authorization (EUA). This EUA will remain  in effect (meaning this test can be used) for the duration of the COVID-19 declaration under Section 564(b)(1) of the Act, 21 U.S.C.section 360bbb-3(b)(1), unless the authorization is terminated  or revoked sooner.       Influenza A by PCR NEGATIVE NEGATIVE Final   Influenza B by PCR NEGATIVE NEGATIVE Final    Comment: (NOTE) The Xpert Xpress SARS-CoV-2/FLU/RSV plus assay is intended as an aid in the diagnosis of influenza from Nasopharyngeal swab specimens and should not be used as a sole basis for treatment. Nasal washings and aspirates are unacceptable for Xpert Xpress SARS-CoV-2/FLU/RSV testing.  Fact Sheet for Patients: BloggerCourse.com  Fact Sheet for Healthcare Providers: SeriousBroker.it  This test is not yet approved or cleared by the [place] FDA and has been authorized for detection and/or diagnosis of SARS-CoV-2 by FDA under an Emergency Use Authorization (EUA). This EUA will remain in effect (meaning this test can be used) for the duration of the COVID-19 declaration under Section 564(b)(1) of the Act, 21 U.S.C. section 360bbb-3(b)(1), unless the authorization is terminated or revoked.  Performed at [facility], [address] [place], [place] [address]   CULTURE, BLOOD (ROUTINE X 2) w Reflex to ID Panel     Status: None (Preliminary result)   Collection Time: 01/20/21  9:55 AM   Specimen: BLOOD  Result Value Ref Range Status   Specimen Description BLOOD RIGHT ANTECUBITAL  Final   Special Requests   Final    BOTTLES DRAWN AEROBIC ONLY Blood Culture adequate volume   Culture   Final    NO GROWTH < 24 HOURS Performed at [facility], [address] [place], [place] [address]    Report Status PENDING  Incomplete  CULTURE, BLOOD (ROUTINE X 2) w Reflex to ID Panel     Status: None (Preliminary result)   Collection Time: 01/20/21 10:20 AM   Specimen: BLOOD RIGHT  HAND  Result Value Ref Range Status   Specimen Description BLOOD RIGHT HAND  Final   Special Requests   Final    BOTTLES DRAWN AEROBIC AND ANAEROBIC Blood Culture adequate volume   Culture   Final    NO GROWTH < 24 HOURS Performed at [facility], [address], [place], [place] [address]    Report Status PENDING  Incomplete         Radiology Studies: CT HEAD WO CONTRAST ( )  Result Date: 01/20/2021 CLINICAL DATA:  55 year old female with altered mental status, confusion. Jaundice. Upper abdominal pain. EXAM: CT HEAD WITHOUT CONTRAST TECHNIQUE: Contiguous axial images were obtained from the base of the skull through the vertex without intravenous contrast. COMPARISON:  None. FINDINGS: Brain: No midline shift, ventriculomegaly, mass effect, evidence of mass lesion, intracranial hemorrhage or evidence of cortically based acute infarction. Gray-white matter differentiation is within normal limits throughout the brain. Vascular: Mild Calcified atherosclerosis at the skull base. No suspicious intracranial vascular hyperdensity. Skull: Negative. Sinuses/Orbits: Visualized paranasal sinuses and mastoids are clear. Other: Visualized orbits and scalp soft tissues are within normal limits. IMPRESSION: Normal noncontrast Head CT.  Electronically Signed   By: Odessa Fleming M.D.   On: 01/20/2021 04:37   CT ABDOMEN PELVIS W CONTRAST  Result Date: 01/20/2021 CLINICAL DATA:  55 year old female with altered mental status, confusion. Jaundice. Upper abdominal pain. EXAM: CT ABDOMEN AND PELVIS WITH CONTRAST TECHNIQUE: Multidetector CT imaging of the abdomen and pelvis was performed using the standard protocol following bolus administration of intravenous contrast. CONTRAST:  OMNIPAQUE IOHEXOL 300 MG/ML  SOLN COMPARISON:  Right upper quadrant ultrasound yesterday. FINDINGS: Lower chest: Borderline to mild cardiomegaly. No pericardial effusion. Confluent but enhancing lung base opacity in keeping with atelectasis. No pleural effusion. Hepatobiliary: Geographic type heterogeneous enhancement of the liver, with hepatomegaly. Liver length is 20 cm plus. Trace perihepatic free fluid. Recanalized paraumbilical vein. No discrete liver lesion. Gallbladder is within normal limits. No biliary ductal dilatation. Pancreas: Negative. Spleen: Trace perisplenic fluid. Estimated splenic volume 300 mL (normal splenic volume range 83 - 412 mL). No discrete splenic lesion. Adrenals/Urinary Tract: Adrenal glands are within normal limits. Kidneys are nonobstructed. There is punctate nephrolithiasis primarily on the right. However, on the delayed images there is no significant renal contrast excretion. The ureters are decompressed. Diminutive, unremarkable bladder. In Stomach/Bowel: Oral contrast type material mixed with stool in the large bowel which is mostly decompressed throughout. The cecum is decompressed. Appendix remains within normal limits arising on coronal image 45. No convincing large bowel inflammation. No dilated small bowel. Stomach is decompressed with possible small gastric hiatal hernia. Small duodenum diverticulum in the midline with no active inflammation. No free air. Only trace free fluid in the abdomen. Vascular/Lymphatic: Mild Aortoiliac  calcified atherosclerosis. Major arterial structures remain patent. Portal venous system is patent. There is a recanalized paraumbilical vein (series 2, image 26) and there are small spleno renal varices. No lymphadenopathy. Reproductive: Negative. Other: Small volume pelvic free fluid with simple fluid density (series 2, image 75). Musculoskeletal: Negative. IMPRESSION: 1. Hepatomegaly with heterogeneous liver enhancement and evidence of portal venous hypertension including recanalized paraumbilical vein, small spleno-renal varices, and small volume ascites. Chronic liver disease strongly suspected although the enhancement pattern might indicate a superimposed acute hepatitis. No evidence of bile duct obstruction. No discrete liver lesion. 2. No obstructive uropathy but absent renal contrast excretion on the delayed images suggesting acute renal insufficiency. Punctate nephrolithiasis. 3. Borderline to mild cardiomegaly. Bilateral lower  lobe atelectasis. Mild aortic atherosclerosis. Electronically Signed   By: Odessa Fleming M.D.   On: 01/20/2021 04:46   DG Chest Portable 1 View  Result Date: 01/20/2021 CLINICAL DATA:  Hypoxia EXAM: PORTABLE CHEST 1 VIEW COMPARISON:  04/28/2012 FINDINGS: Lungs are clear.  No pleural effusion or pneumothorax. The heart is normal in size. IMPRESSION: No evidence of acute cardiopulmonary disease. Electronically Signed   By: Charline Bills M.D.   On: 01/20/2021 03:36   [place] ASCITES (ABDOMEN LIMITED)  Result Date: 01/21/2021 CLINICAL DATA:  Assessment for ascites. EXAM: LIMITED ABDOMEN ULTRASOUND FOR ASCITES TECHNIQUE: Limited ultrasound survey for ascites was performed in all four abdominal quadrants. COMPARISON:  CT of the abdomen and pelvis on 01/20/2021 FINDINGS: Survey of the peritoneal cavity demonstrates only a trace amount of ascites in the lower pelvis. No accessible pocket of ascites is identified for paracentesis. IMPRESSION: Trace ascites in the pelvis. No accessible  pocket of fluid for paracentesis. Electronically Signed   By: Irish Lack M.D.   On: 01/21/2021 10:54   US ABDOMEN LIMITED RUQ (LIVER/GB)  Addendum Date: 01/20/2021   ADDENDUM REPORT: 01/20/2021 02:35 ADDENDUM: In the body of the report the sentence regarding the portal vein should read as follows: Mild patter fugal flow in the portal vein is noted. This is consistent with the underlying findings of early cirrhosis. Electronically Signed   By: Alcide Clever M.D.   On: 01/20/2021 02:35   Result Date: 01/20/2021 CLINICAL DATA:  Jaundice EXAM: ULTRASOUND ABDOMEN LIMITED RIGHT UPPER QUADRANT COMPARISON:  None. FINDINGS: Gallbladder: Gallbladder is well distended with dependent sludge. No wall thickening or pericholecystic fluid is noted. Negative sonographic Murphy's sign is elicited. Common bile duct: Diameter: 5.2 mm. Liver: Diffusely increased in echogenicity. A 9 mm cyst is noted in the right lobe of the liver. Mild nodularity to the liver is noted. Portal vein is patent on color Doppler imaging with normal direction of blood flow towards the liver. Other: Mild perihepatic ascites. IMPRESSION: Changes suggestive of early cirrhosis. No biliary ductal dilatation is seen. Small hepatic cyst. Gallbladder sludge without complicating factors. Electronically Signed: By: Alcide Clever M.D. On: 01/20/2021 01:04        Scheduled Meds:  folic acid  1 mg Oral Daily   lactulose  10 g Oral BID   LORazepam  0-4 mg Intravenous Q6H   Followed by   Melene Muller ON 01/22/2021] LORazepam  0-4 mg Intravenous Q12H   multivitamin with minerals  1 tablet Oral Daily   pantoprazole  40 mg Oral Q1200   thiamine  100 mg Oral Daily   Or   thiamine  100 mg Intravenous Daily   Continuous Infusions:  sodium chloride 75 mL/hr at 01/20/21 1225   cefTRIAXone (ROCEPHIN)  IV 1 g (01/21/21 1135)     LOS: 1 day    Time spent: 35 minutes    Tresa Moore, MD [facility]   If 7PM-7AM, please contact  night-coverage  01/21/2021, 2:06 PM

## 2021-01-21 NOTE — Progress Notes (Signed)
GI Inpatient Follow-up Note  Subjective:  Patient seen in follow-up for alcoholic hepatitis with hepatic encephalopathy. No acute events overnight. She reports "I feel night and day different." She denies any confusion or altered mental status. She had ultrasound today showing trace pelvic ascites with no fluid available for collection. She denies any abdominal pain, nausea, vomiting, diarrhea, constipation, hematochezia, or melena. Liver enzymes are trending down.   Scheduled Inpatient Medications:   folic acid  1 mg Oral Daily   lactulose  10 g Oral BID   LORazepam  0-4 mg Intravenous Q6H   Followed by   Melene Muller ON 01/22/2021] LORazepam  0-4 mg Intravenous Q12H   multivitamin with minerals  1 tablet Oral Daily   pantoprazole  40 mg Oral Q1200   thiamine  100 mg Oral Daily   Or   thiamine  100 mg Intravenous Daily    Continuous Inpatient Infusions:    sodium chloride 75 mL/hr at 01/20/21 1225   cefTRIAXone (ROCEPHIN)  IV 1 g (01/21/21 1135)    PRN Inpatient Medications:  albuterol, dextromethorphan-guaiFENesin, LORazepam **OR** LORazepam, ondansetron (ZOFRAN) IV  Review of Systems: Constitutional: Weight is stable.  Eyes: No changes in vision. ENT: No oral lesions, sore throat.  GI: see HPI.  Heme/Lymph: No easy bruising.  CV: No chest pain.  GU: No hematuria.  Integumentary: No rashes.  Neuro: No headaches.  Psych: No depression/anxiety.  Endocrine: No heat/cold intolerance.  Allergic/Immunologic: No urticaria.  Resp: No cough, SOB.  Musculoskeletal: No joint swelling.    Physical Examination: BP 100/60 (BP Location: Left Arm)   Pulse 77   Temp 98 F (36.7 C) (Oral)   Resp 17   Ht 5\' 5"  (1.651 m)   Wt 81.6 kg   LMP 09/07/2015   SpO2 94%   BMI 29.95 kg/m  Gen: NAD, alert and oriented x 4 HEENT: PEERLA, EOMI, +scleral icterus  Neck: supple, no JVD or thyromegaly Chest: CTA bilaterally, no wheezes, crackles, or other adventitious sounds CV: RRR, no  m/g/c/r Abd: soft, obese abdomen, hypoactive bowel sounds in all four quadrants, nontender to palpation in all four quadrants, hepatomegaly present, guarding, ridigity, or rebound tenderness Ext: no edema, well perfused with 2+ pulses, Skin: no rash or lesions noted Lymph: no LAD  Data: Lab Results  Component Value Date   WBC 12.9 (H) 01/19/2021   HGB 8.4 (L) 01/19/2021   HCT 23.9 (L) 01/19/2021   MCV 127.1 (H) 01/19/2021   PLT 150 01/19/2021   Recent Labs  Lab 01/19/21 1341  HGB 8.4*   Lab Results  Component Value Date   NA 131 (L) 01/21/2021   K 3.3 (L) 01/21/2021   CL 94 (L) 01/21/2021   CO2 29 01/21/2021   BUN 17 01/21/2021   CREATININE 0.54 01/21/2021   Lab Results  Component Value Date   ALT 91 (H) 01/19/2021   AST 371 (H) 01/19/2021   ALKPHOS 195 (H) 01/19/2021   BILITOT 16.0 (H) 01/19/2021   Recent Labs  Lab 01/20/21 0240 01/20/21 0858  APTT  --  38*  INR 1.3*  --    Assessment/Plan:  55 y/o Caucasian female with a PMH of depression, anxiety, and EtOH abuse admitted for alcoholic hepatitis and decompensated chronic liver disease 2/2 new diagnosis of alcoholic cirrhosis  Alcoholic hepatitis - Maddrey DF 21. No indication for glucocorticoids.   Hepatic encephalopathy - much improved with Lactulose PO, no overt HE on exam today  Decompensated chronic liver disease - new diagnosis, 2/2  EtOH abuse  Hyponatremia - suggests SIADH from EtOH abuse, other causes not ruled out  Depression  Recommendations:  - Reviewed cirrhosis pathophysiology, diagnosis, and treatment options with patient in detail in room today. We reviewed the upmost importance of complete alcohol cessation to prevent further hepatic decompensation.  - Alcohol rehabilitation and cessation  - Continue Lactulose PO. Titrate to goal 2-4 soft BMs daily.  - Diagnostic paracentesis could not be performed today since ultrasound showed only trace amount of pelvic ascites - Defer starting  diuretics at this time - Low-sodium diet should be followed. No more than 2,000 mg Na daily.  - Avoid all NSAIDs. Tylenol okay not to exceed 2 grams/day - She would benefit from outpatient GI follow-up with our clinic to schedule EGD for variceal screening and HCC surveillance  - Following    Please call with questions or concerns.   Jacob Moores, PA-C [facility] [phone] (Cell)

## 2021-01-21 NOTE — Care Management (Signed)
Notified by bedside RN.  Patient increasingly agitated, confused, attempting to get up, unsteady on feet.  Suspect worsening EtOH withdrawal.  CIWA score 8-->14.  Plan: Ativan per CIWA protocol Bedside safety sitter Start scheduled diazapam 10mg  TID  Patient is a heavy alcoholic.  High risk for severe withdrawals and further deterioration. Low threshold for transfer to SDU and initiation of precedex gtt.  MD

## 2021-01-22 ENCOUNTER — Inpatient Hospital Stay: Payer: BC Managed Care – PPO

## 2021-01-22 DIAGNOSIS — G9341 Metabolic encephalopathy: Secondary | ICD-10-CM | POA: Diagnosis not present

## 2021-01-22 LAB — CBC WITH DIFFERENTIAL/PLATELET
Abs Immature Granulocytes: 0.15 10*3/uL — ABNORMAL HIGH (ref 0.00–0.07)
Basophils Absolute: 0.1 10*3/uL (ref 0.0–0.1)
Basophils Relative: 1 %
Eosinophils Absolute: 0.2 10*3/uL (ref 0.0–0.5)
Eosinophils Relative: 1 %
HCT: 21.1 % — ABNORMAL LOW (ref 36.0–46.0)
Hemoglobin: 7 g/dL — ABNORMAL LOW (ref 12.0–15.0)
Immature Granulocytes: 1 %
Lymphocytes Relative: 11 %
Lymphs Abs: 1.5 10*3/uL (ref 0.7–4.0)
MCH: 43.8 pg — ABNORMAL HIGH (ref 26.0–34.0)
MCHC: 33.2 g/dL (ref 30.0–36.0)
MCV: 131.9 fL — ABNORMAL HIGH (ref 80.0–100.0)
Monocytes Absolute: 1.4 10*3/uL — ABNORMAL HIGH (ref 0.1–1.0)
Monocytes Relative: 10 %
Neutro Abs: 10.8 10*3/uL — ABNORMAL HIGH (ref 1.7–7.7)
Neutrophils Relative %: 76 %
Platelets: 141 10*3/uL — ABNORMAL LOW (ref 150–400)
RBC: 1.6 MIL/uL — ABNORMAL LOW (ref 3.87–5.11)
RDW: 20 % — ABNORMAL HIGH (ref 11.5–15.5)
Smear Review: NORMAL
WBC: 14.1 10*3/uL — ABNORMAL HIGH (ref 4.0–10.5)
nRBC: 0.4 % — ABNORMAL HIGH (ref 0.0–0.2)

## 2021-01-22 LAB — COMPREHENSIVE METABOLIC PANEL
ALT: 81 U/L — ABNORMAL HIGH (ref 0–44)
AST: 291 U/L — ABNORMAL HIGH (ref 15–41)
Albumin: 2.2 g/dL — ABNORMAL LOW (ref 3.5–5.0)
Alkaline Phosphatase: 160 U/L — ABNORMAL HIGH (ref 38–126)
Anion gap: 8 (ref 5–15)
BUN: 14 mg/dL (ref 6–20)
CO2: 29 mmol/L (ref 22–32)
Calcium: 8.8 mg/dL — ABNORMAL LOW (ref 8.9–10.3)
Chloride: 100 mmol/L (ref 98–111)
Creatinine, Ser: 0.51 mg/dL (ref 0.44–1.00)
GFR, Estimated: >60 mL/min (ref >60)
Glucose, Bld: 117 mg/dL — ABNORMAL HIGH (ref 70–99)
Potassium: 3.6 mmol/L (ref 3.5–5.1)
Sodium: 137 mmol/L (ref 135–145)
Total Bilirubin: 9.6 mg/dL — ABNORMAL HIGH (ref 0.3–1.2)
Total Protein: 6.2 g/dL — ABNORMAL LOW (ref 6.5–8.1)

## 2021-01-22 LAB — HEPATIC FUNCTION PANEL
ALT: 79 U/L — ABNORMAL HIGH (ref 0–44)
AST: 291 U/L — ABNORMAL HIGH (ref 15–41)
Albumin: 2.1 g/dL — ABNORMAL LOW (ref 3.5–5.0)
Alkaline Phosphatase: 150 U/L — ABNORMAL HIGH (ref 38–126)
Bilirubin, Direct: 5.5 mg/dL — ABNORMAL HIGH (ref 0.0–0.2)
Indirect Bilirubin: 4.1 mg/dL — ABNORMAL HIGH (ref 0.3–0.9)
Total Bilirubin: 9.6 mg/dL — ABNORMAL HIGH (ref 0.3–1.2)
Total Protein: 6.1 g/dL — ABNORMAL LOW (ref 6.5–8.1)

## 2021-01-22 LAB — BLOOD GAS, ARTERIAL
Acid-Base Excess: 9.8 mmol/L — ABNORMAL HIGH (ref 0.0–2.0)
Acid-Base Excess: 5.2 mmol/L — ABNORMAL HIGH (ref 0.0–2.0)
Bicarbonate: 33.5 mmol/L — ABNORMAL HIGH (ref 20.0–28.0)
Bicarbonate: 29 mmol/L — ABNORMAL HIGH (ref 20.0–28.0)
FIO2: 0.21
FIO2: 0.28
O2 Saturation: 76.4 %
O2 Saturation: 95.7 %
Patient temperature: 37
Patient temperature: 37
pCO2 arterial: 41 mmHg (ref 32.0–48.0)
pCO2 arterial: 38 mmHg (ref 32.0–48.0)
pH, Arterial: 7.52 — ABNORMAL HIGH (ref 7.350–7.450)
pH, Arterial: 7.49 — ABNORMAL HIGH (ref 7.350–7.450)
pO2, Arterial: 36 mmHg — CL (ref 83.0–108.0)
pO2, Arterial: 73 mmHg — ABNORMAL LOW (ref 83.0–108.0)

## 2021-01-22 IMAGING — DX DG CHEST 1V PORT
1 series · 1 of 1 positions shown · non-contrast
Comparison: [DATE]

CLINICAL DATA: Hepatitis, cirrhosis, hypoxia

EXAM:
PORTABLE CHEST 1 VIEW

[chest ap]
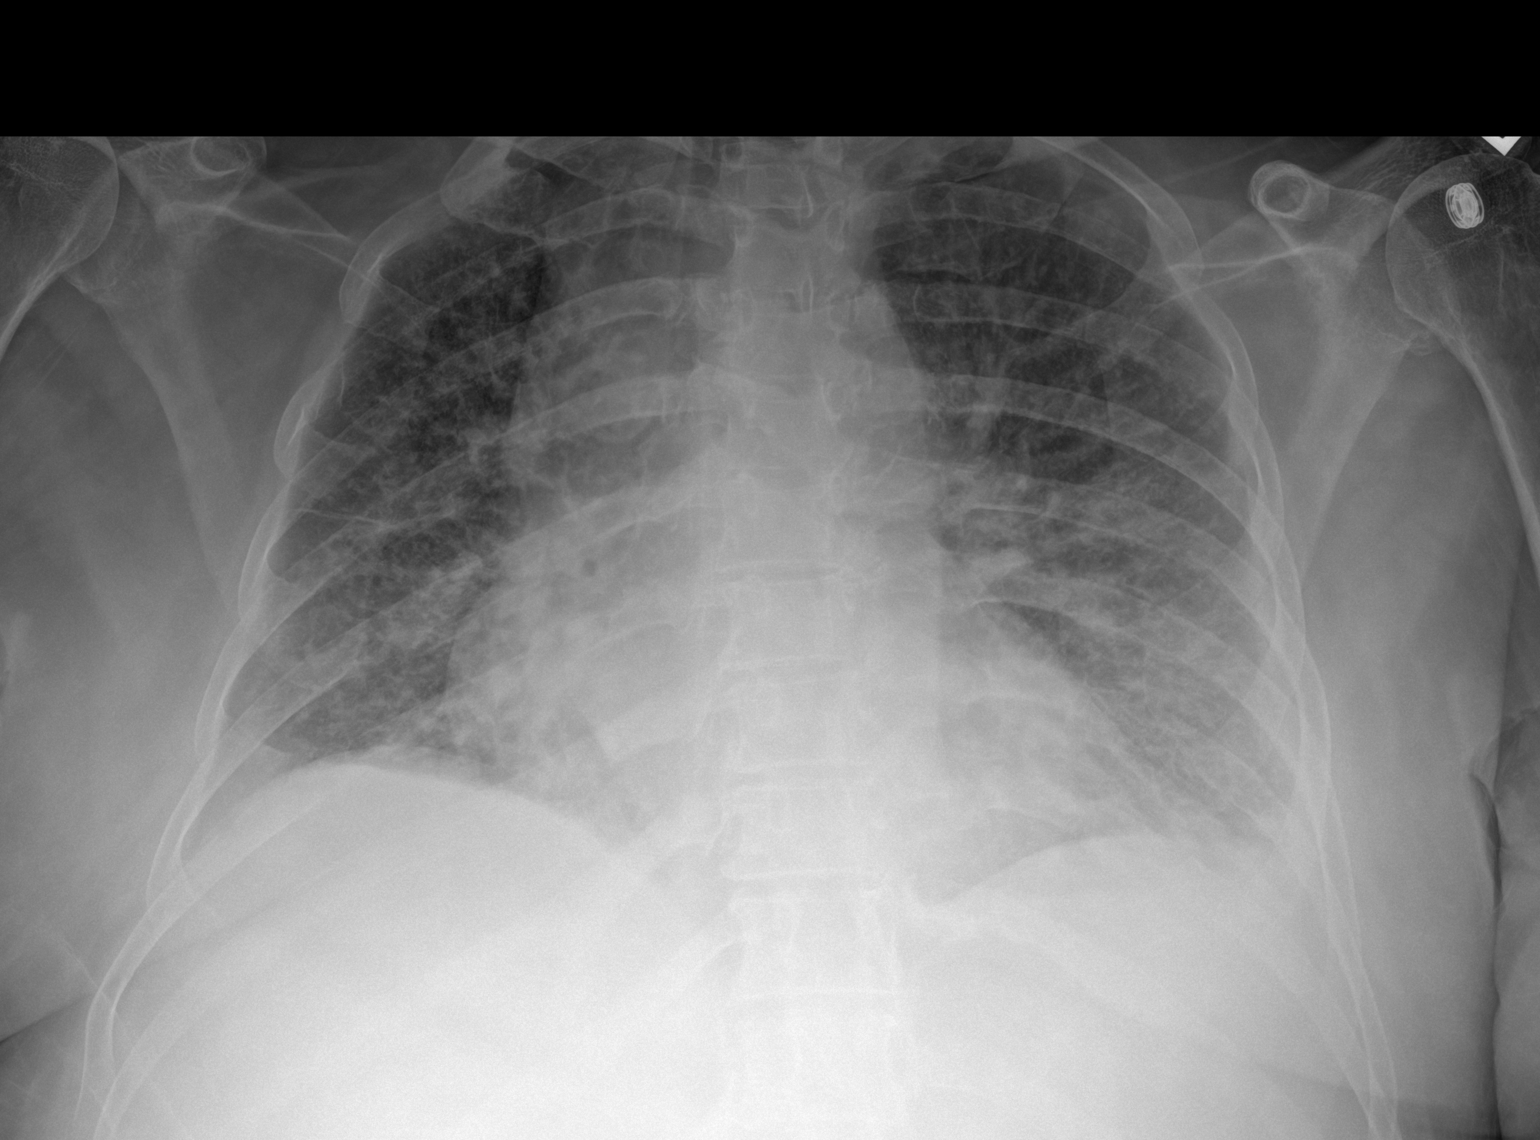

[1 of 1 positions shown; findings below may reference images not displayed]

FINDINGS: Single frontal view of the chest demonstrates an enlarged cardiac
silhouette. There is progressive central vascular congestion with
worsening interstitial and ground-glass opacities throughout the
lungs. Small bilateral pleural effusions are now seen. No
pneumothorax.
IMPRESSION: 1. Findings consistent with worsening volume status and progressive
pulmonary edema.

## 2021-01-22 MED ORDER — FUROSEMIDE 10 MG/ML IJ SOLN
40.0000 mg | Freq: Once | INTRAMUSCULAR | Status: AC
  Administered 2021-01-22: 40 mg via INTRAVENOUS
  Filled 2021-01-22: qty 4

## 2021-01-22 MED ORDER — LACTULOSE ENEMA
300.0000 mL | Freq: Two times a day (BID) | ORAL | Status: DC
  Administered 2021-01-22 – 2021-01-23 (×3): 300 mL via RECTAL
  Filled 2021-01-22 (×8): qty 300

## 2021-01-22 MED ORDER — SODIUM CHLORIDE 0.9 % IV SOLN: INTRAVENOUS | Status: DC | PRN

## 2021-01-22 MED ORDER — DIAZEPAM 5 MG PO TABS
5.0000 mg | ORAL_TABLET | Freq: Three times a day (TID) | ORAL | Status: DC
  Administered 2021-01-22: 5 mg via ORAL
  Filled 2021-01-22: qty 1

## 2021-01-22 MED ORDER — OXYCODONE HCL 5 MG PO TABS: 5.0000 mg | ORAL_TABLET | ORAL | Status: DC | PRN

## 2021-01-22 MED ORDER — SODIUM CHLORIDE 0.9 % IV SOLN: INTRAVENOUS | Status: DC

## 2021-01-22 NOTE — Progress Notes (Addendum)
PROGRESS NOTE    [name] Carrillo  UXL:[id number] DOB: 1965/07/21 DOA: 01/20/2021 PCP: Patient, No Pcp Per (Inactive)    Brief Narrative:   55 y.o. female with medical history significant of depression with anxiety, alcohol abuse, who presented with altered mental status, jaundice, abdominal pain.   Per her husband at the bedside, patient has been intermittently confused for almost a week.  Patient moves all extremities normally.  No facial droop or slurred speech.  She has malaise and weakness.  She has jaundice with yellow eyes.  Patient has mild pain in upper abdomen.  Denies nausea, vomiting or diarrhea.  Denies dark stool or rectal bleeding.  Patient denies chest pain, cough, shortness breath.  Patient states that she has difficulty urinating, she states she is not sure if she has burning or dysuria. When I saw patient in ED, patient is lethargic, but orientated x3.  She moves all extremities normally  Started on lactulose with marked improvement in encephalopathy and lethargy as well as weakness.  Seen in consultation by gastroenterology.  Recommendations appreciated.  10/21: Last night patient became more agitated and confused.  Unsteady on feet.  Attempting to get up out of bed.  Signs and symptoms consistent with acute alcohol withdrawal.  Started on as needed Ativan and scheduled diazepam for withdrawal symptoms.  More lethargic this morning.   Assessment & Plan:   Principal Problem:   Acute metabolic encephalopathy Active Problems:   Cirrhosis of liver (HCC)   Depression with anxiety   Alcohol abuse   Acute hepatic encephalopathy   Abnormal LFTs   Hyponatremia   UTI (urinary tract infection)   Macrocytic anemia   Leukocytosis   Acute respiratory failure with hypoxia (HCC)  Acute metabolic encephalopathy Hepatic encephalopathy Improved Suspected hepatic encephalopathy CT head negative Difficult to exclude UTI as contributing factor Acute alcohol withdrawal noted 10/20,  also clotting clinical picture Plan: Continue lactulose, titrate to 2-4 soft BM daily Continue IV Rocephin for possible UTI, 3-day stop date, last dose 10/21 Appreciate GI follow-up and education regarding pathophysiology of cirrhosis  Acute alcohol withdrawal CIWA score increased to 14 on 10/20 Plan: CIWA protocol with as needed IV Ativan Scheduled diazepam 5 mg p.o. 3 times daily High-dose IV thiamine Folic acid Daily multivitamin    Cirrhosis of liver and abnormal liver function Most likely due to alcohol abuse No accessible fluid pocket for safe paracentesis Plan: No indication for steroids Educate on alcohol abuse   Depression with anxiety Patient's not taking medications currently -Observe closely   Alcohol abuse -Counseled extensively on importance of alcohol cessation   Hyponatremia  Sodium 124 on admission, suspect beer potomania Improved   Possible UTI (urinary tract infection) -IV Rocephin, last dose 10/21    Macrocytic anemia Hemoglobin 8.4 (of 14.2 on 08/01/2019) patient denies dark stool or rectal bleeding No indication for transfusion at this time Monitor H&H, transfuse for hemoglobin less than 7  Acute respiratory failure with hypoxia (HCC)  Oxygen desaturated to 88% on room air, currently 95% on 2 L oxygen.   Patient denies shortness of breath or chest pain, cough.   Chest x-ray negative.  Etiology is not clear, may be due to altered mental status. Plan: As needed nebulizer Wean oxygen as tolerated   DVT prophylaxis: SCD Code Status: Full code Family Communication: Joycelyn Man spouse [phone] on 10/21 Disposition Plan: Status is: Inpatient  Remains inpatient appropriate because: Acute alcoholic hepatitis.  Now with alcohol withdrawal.  Disposition plan pending.  Level of care: Med-Surg  Consultants:  GI  Procedures:  None  Antimicrobials: Ceftriaxone   Subjective: Patient seen and examined.  More confused this  morning.  Lethargic.  Objective: Vitals:   01/21/21 1924 01/22/21 0405 01/22/21 0407 01/22/21 0757  BP:  (!) 94/57 101/61 (!) 94/48  Pulse:  83 81 82  Resp:  20  16  Temp:  98.4 F (36.9 C)  97.8 F (36.6 C)  TempSrc:  Oral  Oral  SpO2: 92% 95%  92%  Weight:      Height:        Intake/Output Summary (Last 24 hours) at 01/22/2021 1020 Last data filed at 01/22/2021 6144 Gross per 24 hour  Intake 700 ml  Output 70 ml  Net 630 ml   Filed Weights   01/19/21 1338  Weight: 81.6 kg    Examination:  General exam: Confused.  Lethargic Respiratory system: Clear to auscultation. Respiratory effort normal. Cardiovascular system: S1-S2, RRR, no murmurs, no pedal edema Gastrointestinal system: Soft, nontender, mild distention, positive bowel sounds Central nervous system: Confused.  Oriented to person only.  No focal deficits Extremities: Symmetric 5 x 5 power.  Mild tremor Skin: Mildly icteric Psychiatry: Judgement and insight appear normal. Mood & affect appropriate.     Data Reviewed: I have personally reviewed following labs and imaging studies  CBC: Recent Labs  Lab 01/19/21 1341 01/22/21 0758  WBC 12.9* 14.1*  NEUTROABS  --  10.8*  HGB 8.4* 7.0*  HCT 23.9* 21.1*  MCV 127.1* 131.9*  PLT 150 141*   Basic Metabolic Panel: Recent Labs  Lab 01/20/21 0955 01/20/21 1526 01/20/21 2316 01/21/21 0722 01/22/21 0758  NA 129* 128* 130* 131* 137  K 3.4* 3.3* 3.1* 3.3* 3.6  CL 88* 88* 90* 94* 100  CO2 30 29 29 29 29   GLUCOSE 114* 140* 109* 111* 117*  BUN 21* 20 18 17 14   CREATININE 0.58 0.54 0.54 0.54 0.51  CALCIUM 9.1 9.1 8.8* 8.6* 8.8*  MG 1.7  --   --   --   --   PHOS 2.7  --   --   --   --    GFR: Estimated Creatinine Clearance: 82.8 mL/min (by C-G formula based on SCr of 0.51 mg/dL). Liver Function Tests: Recent Labs  Lab 01/19/21 1341 01/22/21 0758  AST 371* 291*  291*  ALT 91* 79*  81*  ALKPHOS 195* 150*  160*  BILITOT 16.0* 9.6*  9.6*  PROT  7.6 6.1*  6.2*  ALBUMIN 2.6* 2.1*  2.2*   Recent Labs  Lab 01/19/21 1341  LIPASE 54*   Recent Labs  Lab 01/19/21 1341  AMMONIA 35   Coagulation Profile: Recent Labs  Lab 01/20/21 0240  INR 1.3*   Cardiac Enzymes: No results for input(s): CKTOTAL, CKMB, CKMBINDEX, TROPONINI in the last 168 hours. BNP (last 3 results) No results for input(s): PROBNP in the last 8760 hours. HbA1C: No results for input(s): HGBA1C in the last 72 hours. CBG: No results for input(s): GLUCAP in the last 168 hours. Lipid Profile: No results for input(s): CHOL, HDL, LDLCALC, TRIG, CHOLHDL, LDLDIRECT in the last 72 hours. Thyroid Function Tests: No results for input(s): TSH, T4TOTAL, FREET4, T3FREE, THYROIDAB in the last 72 hours. Anemia Panel: Recent Labs    01/20/21 0955 01/20/21 1019  VITAMINB12 694  --   FOLATE 2.0*  --   FERRITIN 1,999*  --   TIBC 136*  --   IRON 123  --  RETICCTPCT  --  6.3*   Sepsis Labs: No results for input(s): PROCALCITON, LATICACIDVEN in the last 168 hours.  Recent Results (from the past 240 hour(s))  Urine Culture     Status: None (Preliminary result)   Collection Time: 01/19/21 11:40 PM   Specimen: Urine, Clean Catch  Result Value Ref Range Status   Specimen Description   Final    URINE, CLEAN CATCH Performed at [facility], [address] [place], [place] [address]    Special Requests   Final    NONE Performed at [facility], [address], [place], [place] [address]    Culture   Final    CULTURE REINCUBATED FOR BETTER GROWTH Performed at [facility], [address] [place], [place] [address]    Report Status PENDING  Incomplete  Resp Panel by RT-PCR (Flu A&B, Covid) Nasopharyngeal Swab     Status: None   Collection Time: 01/20/21  1:43 AM   Specimen: Nasopharyngeal Swab; Nasopharyngeal(NP) swabs in vial transport medium  Result Value Ref Range Status   SARS Coronavirus 2 by RT PCR NEGATIVE NEGATIVE Final     Comment: (NOTE) SARS-CoV-2 target nucleic acids are NOT DETECTED.  The SARS-CoV-2 RNA is generally detectable in upper respiratory specimens during the acute phase of infection. The lowest concentration of SARS-CoV-2 viral copies this assay can detect is 138 copies/mL. A negative result does not preclude SARS-Cov-2 infection and should not be used as the sole basis for treatment or other patient management decisions. A negative result may occur with  improper specimen collection/handling, submission of specimen other than nasopharyngeal swab, presence of viral mutation(s) within the areas targeted by this assay, and inadequate number of viral copies(<138 copies/mL). A negative result must be combined with clinical observations, patient history, and epidemiological information. The expected result is Negative.  Fact Sheet for Patients:  BloggerCourse.com  Fact Sheet for Healthcare Providers:  SeriousBroker.it  This test is no t yet approved or cleared by the [place] FDA and  has been authorized for detection and/or diagnosis of SARS-CoV-2 by FDA under an Emergency Use Authorization (EUA). This EUA will remain  in effect (meaning this test can be used) for the duration of the COVID-19 declaration under Section 564(b)(1) of the Act, 21 U.S.C.section 360bbb-3(b)(1), unless the authorization is terminated  or revoked sooner.       Influenza A by PCR NEGATIVE NEGATIVE Final   Influenza B by PCR NEGATIVE NEGATIVE Final    Comment: (NOTE) The Xpert Xpress SARS-CoV-2/FLU/RSV plus assay is intended as an aid in the diagnosis of influenza from Nasopharyngeal swab specimens and should not be used as a sole basis for treatment. Nasal washings and aspirates are unacceptable for Xpert Xpress SARS-CoV-2/FLU/RSV testing.  Fact Sheet for Patients: BloggerCourse.com  Fact Sheet for Healthcare  Providers: SeriousBroker.it  This test is not yet approved or cleared by the [place] FDA and has been authorized for detection and/or diagnosis of SARS-CoV-2 by FDA under an Emergency Use Authorization (EUA). This EUA will remain in effect (meaning this test can be used) for the duration of the COVID-19 declaration under Section 564(b)(1) of the Act, 21 U.S.C. section 360bbb-3(b)(1), unless the authorization is terminated or revoked.  Performed at [facility], [address] [place], [place] [address]   CULTURE, BLOOD (ROUTINE X 2) w Reflex to ID Panel     Status: None (Preliminary result)   Collection Time: 01/20/21  9:55 AM   Specimen: BLOOD  Result Value Ref Range Status   Specimen Description BLOOD RIGHT ANTECUBITAL  Final   Special Requests   Final    BOTTLES DRAWN AEROBIC ONLY Blood Culture adequate volume   Culture   Final    NO GROWTH 2 DAYS Performed at [facility], [address] [place], [place] [address]    Report Status PENDING  Incomplete  CULTURE, BLOOD (ROUTINE X 2) w Reflex to ID Panel     Status: None (Preliminary result)   Collection Time: 01/20/21 10:20 AM   Specimen: BLOOD RIGHT HAND  Result Value Ref Range Status   Specimen Description BLOOD RIGHT HAND  Final   Special Requests   Final    BOTTLES DRAWN AEROBIC AND ANAEROBIC Blood Culture adequate volume   Culture   Final    NO GROWTH 2 DAYS Performed at [facility], [address] [place], [place] [address]    Report Status PENDING  Incomplete         Radiology Studies: [place] ASCITES (ABDOMEN LIMITED)  Result Date: 01/21/2021 CLINICAL DATA:  Assessment for ascites. EXAM: LIMITED ABDOMEN ULTRASOUND FOR ASCITES TECHNIQUE: Limited ultrasound survey for ascites was performed in all four abdominal quadrants. COMPARISON:  CT of the abdomen and pelvis on 01/20/2021 FINDINGS: Survey of the peritoneal cavity demonstrates only a trace amount of  ascites in the lower pelvis. No accessible pocket of ascites is identified for paracentesis. IMPRESSION: Trace ascites in the pelvis. No accessible pocket of fluid for paracentesis. Electronically Signed   By: Irish Lack M.D.   On: 01/21/2021 10:54        Scheduled Meds:  diazepam  5 mg Oral TID   folic acid  1 mg Oral Daily   lactulose  10 g Oral BID   LORazepam  0-4 mg Intravenous Q12H   multivitamin with minerals  1 tablet Oral Daily   pantoprazole  40 mg Oral Q1200   Continuous Infusions:  thiamine injection 500 mg (01/22/21 0600)     LOS: 2 days    Time spent: 25 minutes    Tresa Moore, MD [facility]   If 7PM-7AM, please contact night-coverage  01/22/2021, 10:20 AM

## 2021-01-22 NOTE — Progress Notes (Signed)
GI Inpatient Follow-up Note  Subjective:  Patient seen in follow-up for alcoholic hepatitis and cirrhosis. She did become agitated and confused last night c/w alcohol withdrawal and she was started on Ativan and Diazepam. She has sitter in room with her today. She is confused and has been sleeping not arouseable.   Scheduled Inpatient Medications:   diazepam  5 mg Oral TID   folic acid  1 mg Oral Daily   lactulose  10 g Oral BID   LORazepam  0-4 mg Intravenous Q12H   multivitamin with minerals  1 tablet Oral Daily   pantoprazole  40 mg Oral Q1200    Continuous Inpatient Infusions:    thiamine injection 500 mg (01/22/21 0600)    PRN Inpatient Medications:  albuterol, dextromethorphan-guaiFENesin, LORazepam **OR** LORazepam, ondansetron (ZOFRAN) IV, oxyCODONE  Review of Systems:  Unable to obtain due to confusion/lethargy.    Physical Examination: BP (!) 98/52 (BP Location: Left Arm)   Pulse 86   Temp 97.7 F (36.5 C) (Oral)   Resp 16   Ht 5\' 5"  (1.651 m)   Wt 81.6 kg   LMP 09/07/2015   SpO2 92%   BMI 29.95 kg/m  Confused, lethargic. Oriented to person only.  HEENT: PEERLA, EOMI, +scleral icterus  Neck: supple, no JVD or thyromegaly Chest: CTA bilaterally, no wheezes, crackles, or other adventitious sounds CV: RRR, no m/g/c/r Abd: soft, mildly distended, tender to palpation in RUQ, hypoactive bowel sounds in all four quadrants, no HSM, guarding, ridigity, or rebound tenderness Ext: no edema, well perfused with 2+ pulses, Skin: no rash or lesions noted Lymph: no LAD  Data: Lab Results  Component Value Date   WBC 14.1 (H) 01/22/2021   HGB 7.0 (L) 01/22/2021   HCT 21.1 (L) 01/22/2021   MCV 131.9 (H) 01/22/2021   PLT 141 (L) 01/22/2021   Recent Labs  Lab 01/19/21 1341 01/22/21 0758  HGB 8.4* 7.0*   Lab Results  Component Value Date   NA 137 01/22/2021   K 3.6 01/22/2021   CL 100 01/22/2021   CO2 29 01/22/2021   BUN 14 01/22/2021   CREATININE 0.51  01/22/2021   Lab Results  Component Value Date   ALT 81 (H) 01/22/2021   ALT 79 (H) 01/22/2021   AST 291 (H) 01/22/2021   AST 291 (H) 01/22/2021   ALKPHOS 160 (H) 01/22/2021   ALKPHOS 150 (H) 01/22/2021   BILITOT 9.6 (H) 01/22/2021   BILITOT 9.6 (H) 01/22/2021   Recent Labs  Lab 01/20/21 0240 01/20/21 0858  APTT  --  38*  INR 1.3*  --    Assessment/Plan:  55 y/o Caucasian female with a PMH of depression, anxiety, and EtOH abuse admitted for alcoholic hepatitis and decompensated chronic liver disease 2/2 new diagnosis of alcoholic cirrhosis   Alcoholic hepatitis - Maddrey DF 21. No indication for glucocorticoids.    Hepatic encephalopathy - much improved with Lactulose PO, no overt HE on exam today. Agitation/confusion c/w alcohol withdrawal  Alcohol withdrawal - CIWA protocol, continue IV Atiban, scheduled Diazepam, high dose thiamine, folic acid, and MVI   4.   Decompensated chronic liver disease - new diagnosis, 2/2 EtOH abuse   5.   Hyponatremia - suggests SIADH from EtOH abuse, other causes not  ruled out   6.   Depression   Recommendations:   - No indication for glucocorticoid treatment given Maddrey DF <32. - Continue supportive care with IV fluid hydration, nutrition - Continue Lactulose PO. Titrate to goal 2-4  soft BMs daily. If she cannot tolerate PO, can use Lactulose enemas - Low-sodium diet should be followed. No more than 2,000 mg Na daily.  - Avoid all NSAIDs. Tylenol okay not to exceed 2 grams/day - Continue serial examinations - Continue CIWA protocol and treatment of alcohol withdrawal - Reviewed importance of strict alcohol cessation - She would benefit from outpatient GI follow-up with our clinic to schedule EGD for variceal screening and HCC surveillance  - GI will sign off at this time and follow peripherally. Dr. Timothy Lasso will be on call over the weekend if there are questions or acute concerns.     Please call with questions or  concerns.    Jacob Moores, PA-C [facility] [phone] (Cell)

## 2021-01-22 NOTE — Care Management (Signed)
Patient remained lethargic and encephalopathic during the day.  Ordered ABG to evaluate for CO2 retention.  Initial ABG with marked hypoxia.  Patient placed on continuous pulse ox with good waveform.  Sat 94% on 2L.  Normal WOB.  STAT CXR with worsening interstitial edema.  Suspect fluid overload/atelectasis.  Repeat ABG with improved PO2.  Suspect mixed venous sample from initial blood gas.  Also with worsening macrocytic anemia.  No evidence of blood loss.  Given macrocytosis, suspect B12 defiiciency in patient with history of EtOH abuse.  Patient has been unable to take her PO lactulose due to encephalopathy.  One BM recorded this AM  Plan: Continuous pulse ox Maintain sats above 92% Switch to lactulose enemas.  BID. Titrate to 2-4 soft BM daily Stop IVF Give 1 dose lasix Stop scheduled Valium Continue with PRN Ativan per CIWA protocol AM labs ordered  Monitor carefully.  Low threshold for transfer to higher level of care.  Lolita Patella MD

## 2021-01-23 DIAGNOSIS — G9341 Metabolic encephalopathy: Secondary | ICD-10-CM | POA: Diagnosis not present

## 2021-01-23 LAB — COMPREHENSIVE METABOLIC PANEL
ALT: 74 U/L — ABNORMAL HIGH (ref 0–44)
AST: 265 U/L — ABNORMAL HIGH (ref 15–41)
Albumin: 2.1 g/dL — ABNORMAL LOW (ref 3.5–5.0)
Alkaline Phosphatase: 155 U/L — ABNORMAL HIGH (ref 38–126)
Anion gap: 8 (ref 5–15)
BUN: 13 mg/dL (ref 6–20)
CO2: 30 mmol/L (ref 22–32)
Calcium: 8.6 mg/dL — ABNORMAL LOW (ref 8.9–10.3)
Chloride: 101 mmol/L (ref 98–111)
Creatinine, Ser: 0.61 mg/dL (ref 0.44–1.00)
GFR, Estimated: >60 mL/min (ref >60)
Glucose, Bld: 114 mg/dL — ABNORMAL HIGH (ref 70–99)
Potassium: 3.4 mmol/L — ABNORMAL LOW (ref 3.5–5.1)
Sodium: 139 mmol/L (ref 135–145)
Total Bilirubin: 9.7 mg/dL — ABNORMAL HIGH (ref 0.3–1.2)
Total Protein: 6.4 g/dL — ABNORMAL LOW (ref 6.5–8.1)

## 2021-01-23 LAB — CBC WITH DIFFERENTIAL/PLATELET
Abs Immature Granulocytes: 0.16 10*3/uL — ABNORMAL HIGH (ref 0.00–0.07)
Basophils Absolute: 0.1 10*3/uL (ref 0.0–0.1)
Basophils Relative: 1 %
Eosinophils Absolute: 0.2 10*3/uL (ref 0.0–0.5)
Eosinophils Relative: 1 %
HCT: 22.6 % — ABNORMAL LOW (ref 36.0–46.0)
Hemoglobin: 7.4 g/dL — ABNORMAL LOW (ref 12.0–15.0)
Immature Granulocytes: 1 %
Lymphocytes Relative: 12 %
Lymphs Abs: 1.8 10*3/uL (ref 0.7–4.0)
MCH: 43.8 pg — ABNORMAL HIGH (ref 26.0–34.0)
MCHC: 32.7 g/dL (ref 30.0–36.0)
MCV: 133.7 fL — ABNORMAL HIGH (ref 80.0–100.0)
Monocytes Absolute: 1.3 10*3/uL — ABNORMAL HIGH (ref 0.1–1.0)
Monocytes Relative: 9 %
Neutro Abs: 11.4 10*3/uL — ABNORMAL HIGH (ref 1.7–7.7)
Neutrophils Relative %: 76 %
Platelets: 148 10*3/uL — ABNORMAL LOW (ref 150–400)
RBC: 1.69 MIL/uL — ABNORMAL LOW (ref 3.87–5.11)
RDW: 19.7 % — ABNORMAL HIGH (ref 11.5–15.5)
Smear Review: NORMAL
WBC: 14.9 10*3/uL — ABNORMAL HIGH (ref 4.0–10.5)
nRBC: 0.3 % — ABNORMAL HIGH (ref 0.0–0.2)

## 2021-01-23 LAB — IRON AND TIBC
Iron: 31 ug/dL (ref 28–170)
Saturation Ratios: 26 % (ref 10.4–31.8)
TIBC: 119 ug/dL — ABNORMAL LOW (ref 250–450)
UIBC: 88 ug/dL

## 2021-01-23 LAB — BRAIN NATRIURETIC PEPTIDE: B Natriuretic Peptide: 438.7 pg/mL — ABNORMAL HIGH (ref 0.0–100.0)

## 2021-01-23 LAB — FOLATE: Folate: 1.7 ng/mL — ABNORMAL LOW (ref >5.9)

## 2021-01-23 LAB — FERRITIN: Ferritin: 1466 ng/mL — ABNORMAL HIGH (ref 11–307)

## 2021-01-23 LAB — HIV ANTIBODY (ROUTINE TESTING W REFLEX): HIV Screen 4th Generation wRfx: NONREACTIVE

## 2021-01-23 LAB — VITAMIN B12: Vitamin B-12: 668 pg/mL (ref 180–914)

## 2021-01-23 MED ORDER — LORAZEPAM 1 MG PO TABS: 1.0000 mg | ORAL_TABLET | ORAL | Status: DC | PRN

## 2021-01-23 MED ORDER — HALOPERIDOL 2 MG PO TABS
2.0000 mg | ORAL_TABLET | Freq: Four times a day (QID) | ORAL | Status: DC | PRN
Start: 2021-01-23 — End: 2021-01-23
  Filled 2021-01-23: qty 1

## 2021-01-23 MED ORDER — HALOPERIDOL LACTATE 5 MG/ML IJ SOLN
2.0000 mg | Freq: Four times a day (QID) | INTRAMUSCULAR | Status: DC | PRN
Start: 2021-01-23 — End: 2021-01-23
  Administered 2021-01-23: 2 mg via INTRAMUSCULAR
  Filled 2021-01-23 (×2): qty 1

## 2021-01-23 MED ORDER — LORAZEPAM 2 MG/ML IJ SOLN
1.0000 mg | INTRAMUSCULAR | Status: DC | PRN
  Administered 2021-01-23 – 2021-01-24 (×4): 2 mg via INTRAVENOUS
  Administered 2021-01-24: 4 mg via INTRAVENOUS
  Filled 2021-01-23 (×4): qty 1
  Filled 2021-01-23: qty 2

## 2021-01-23 NOTE — Progress Notes (Signed)
Mobility Specialist - Progress Note   01/23/21 1300  Mobility  Activity Refused mobility  Mobility performed by Mobility specialist    Pt increasingly agitated and unable to be redirected at this time. Will attempt session another date.    Filiberto Pinks Mobility Specialist 01/23/21, 1:32 PM

## 2021-01-23 NOTE — Progress Notes (Signed)
PROGRESS NOTE    [name] Carrillo  OIN:[id number] DOB: [birth year] DOA: 01/20/2021 PCP: Patient, No Pcp Per (Inactive)    Brief Narrative:   55 y.o. female with medical history significant of depression with anxiety, alcohol abuse, who presented with altered mental status, jaundice, abdominal pain.   Per her husband at the bedside, patient has been intermittently confused for almost a week.  Patient moves all extremities normally.  No facial droop or slurred speech.  She has malaise and weakness.  She has jaundice with yellow eyes.  Patient has mild pain in upper abdomen.  Denies nausea, vomiting or diarrhea.  Denies dark stool or rectal bleeding.  Patient denies chest pain, cough, shortness breath.  Patient states that she has difficulty urinating, she states she is not sure if she has burning or dysuria. When I saw patient in ED, patient is lethargic, but orientated x3.  She moves all extremities normally  Started on lactulose with marked improvement in encephalopathy and lethargy as well as weakness.  Seen in consultation by gastroenterology.  Recommendations appreciated.  10/21: Last night patient became more agitated and confused.  Unsteady on feet.  Attempting to get up out of bed.  Signs and symptoms consistent with acute alcohol withdrawal.  Started on as needed Ativan and scheduled diazepam for withdrawal symptoms.  More lethargic this morning.  10/22: Patient remains lethargic and encephalopathic.  Intermittently agitated   Assessment & Plan:   Principal Problem:   Acute metabolic encephalopathy Active Problems:   Cirrhosis of liver (HCC)   Depression with anxiety   Alcohol abuse   Acute hepatic encephalopathy   Abnormal LFTs   Hyponatremia   UTI (urinary tract infection)   Macrocytic anemia   Leukocytosis   Acute respiratory failure with hypoxia (HCC)  Acute metabolic encephalopathy Hepatic encephalopathy Suspected hepatic encephalopathy versus withdrawal versus  benzodiazepine effect CT head negative Difficult to exclude UTI as contributing factor Acute alcohol withdrawal noted 10/20, also clouding clinical picture Completed IV Rocephin for empiric UTI treatment Unable to tolerate p.o. Plan: Continue twice daily lactulose enemas, titrate to 2-4 soft BM daily One-to-one sitter CIWA protocol as below  Acute alcohol withdrawal CIWA score increased to 14 on 10/20 Patient has been mainly lethargic during the day Plan: CIWA protocol with as needed IV Ativan DC scheduled diazepam High-dose IV thiamine, 500 IV 3 times daily x3 days Folic acid Daily multivitamin    Cirrhosis of liver and abnormal liver function Most likely due to alcohol abuse No accessible fluid pocket for safe paracentesis Plan: No indication for steroids Educate on alcohol abuse   Depression with anxiety Patient's not taking medications currently -Observe closely   Alcohol abuse -Counseled extensively on importance of alcohol cessation   Hyponatremia  Sodium 124 on admission, suspect beer potomania Improved   Possible UTI (urinary tract infection) -IV Rocephin, last dose 10/21    Macrocytic anemia Hemoglobin 8.4 (of 14.2 on 08/01/2019) patient denies dark stool or rectal bleeding No indication for transfusion at this time Monitor H&H, transfuse for hemoglobin less than 7  Acute respiratory failure with hypoxia (HCC)  Oxygen desaturated to 88% on room air, currently 95% on 2 L oxygen.   Patient denies shortness of breath or chest pain, cough.   Chest x-ray negative.  Etiology is not clear, may be due to altered mental status. Plan: As needed nebulizer Wean oxygen as tolerated   DVT prophylaxis: SCD Code Status: Full code Family Communication: Joycelyn Man spouse [phone] on 10/21 Disposition Plan:  Status is: Inpatient  Remains inpatient appropriate because: Acute alcoholic hepatitis.  Now with alcohol withdrawal and persistent encephalopathy.   Disposition plan pending.       Level of care: Med-Surg  Consultants:  GI  Procedures:  None  Antimicrobials: Ceftriaxone   Subjective: Patient seen and examined.  Remains lethargic.  Does awaken intermittently.  Confused and unsteady on feet  Objective: Vitals:   01/22/21 2045 01/23/21 0319 01/23/21 0716 01/23/21 0924  BP: (!) 102/48 (!) 94/52  (!) 98/38  Pulse: 90 80  84  Resp: 16 20  16   Temp: 98.8 F (37.1 C) (!) 97.5 F (36.4 C)  98.3 F (36.8 C)  TempSrc: Axillary Oral  Oral  SpO2: 95% 95% 98% 96%  Weight:      Height:        Intake/Output Summary (Last 24 hours) at 01/23/2021 1036 Last data filed at 01/23/2021 0454 Gross per 24 hour  Intake 118.22 ml  Output 600 ml  Net -481.78 ml   Filed Weights   01/19/21 1338  Weight: 81.6 kg    Examination:  General exam: Lethargic, confused Respiratory system: Bibasilar crackles.  Normal work of breathing.  2 L Cardiovascular system: S1-S2, RRR, no murmurs, no pedal edema Gastrointestinal system: Soft, nontender, mild distention, positive bowel sounds Central nervous system: Confused.  Oriented to person only.  No focal deficits Extremities: Symmetric 5 x 5 power.  Mild tremor Skin: Mildly icteric Psychiatry: Judgement and insight appear impaired. Mood & affect flattened.     Data Reviewed: I have personally reviewed following labs and imaging studies  CBC: Recent Labs  Lab 01/19/21 1341 01/22/21 0758 01/23/21 0414  WBC 12.9* 14.1* 14.9*  NEUTROABS  --  10.8* 11.4*  HGB 8.4* 7.0* 7.4*  HCT 23.9* 21.1* 22.6*  MCV 127.1* 131.9* 133.7*  PLT 150 141* 148*   Basic Metabolic Panel: Recent Labs  Lab 01/20/21 0955 01/20/21 1526 01/20/21 2316 01/21/21 0722 01/22/21 0758 01/23/21 0414  NA 129* 128* 130* 131* 137 139  K 3.4* 3.3* 3.1* 3.3* 3.6 3.4*  CL 88* 88* 90* 94* 100 101  CO2 30 29 29 29 29 30   GLUCOSE 114* 140* 109* 111* 117* 114*  BUN 21* 20 18 17 14 13   CREATININE 0.58 0.54 0.54  0.54 0.51 0.61  CALCIUM 9.1 9.1 8.8* 8.6* 8.8* 8.6*  MG 1.7  --   --   --   --   --   PHOS 2.7  --   --   --   --   --    GFR: Estimated Creatinine Clearance: 82.8 mL/min (by C-G formula based on SCr of 0.61 mg/dL). Liver Function Tests: Recent Labs  Lab 01/19/21 1341 01/22/21 0758 01/23/21 0414  AST 371* 291*  291* 265*  ALT 91* 79*  81* 74*  ALKPHOS 195* 150*  160* 155*  BILITOT 16.0* 9.6*  9.6* 9.7*  PROT 7.6 6.1*  6.2* 6.4*  ALBUMIN 2.6* 2.1*  2.2* 2.1*   Recent Labs  Lab 01/19/21 1341  LIPASE 54*   Recent Labs  Lab 01/19/21 1341  AMMONIA 35   Coagulation Profile: Recent Labs  Lab 01/20/21 0240  INR 1.3*   Cardiac Enzymes: No results for input(s): CKTOTAL, CKMB, CKMBINDEX, TROPONINI in the last 168 hours. BNP (last 3 results) No results for input(s): PROBNP in the last 8760 hours. HbA1C: No results for input(s): HGBA1C in the last 72 hours. CBG: No results for input(s): GLUCAP in the last 168 hours. Lipid  Profile: No results for input(s): CHOL, HDL, LDLCALC, TRIG, CHOLHDL, LDLDIRECT in the last 72 hours. Thyroid Function Tests: No results for input(s): TSH, T4TOTAL, FREET4, T3FREE, THYROIDAB in the last 72 hours. Anemia Panel: Recent Labs    01/23/21 0414  VITAMINB12 668  FOLATE 1.7*  FERRITIN 1,466*  TIBC 119*  IRON 31   Sepsis Labs: No results for input(s): PROCALCITON, LATICACIDVEN in the last 168 hours.  Recent Results (from the past 240 hour(s))  Urine Culture     Status: Abnormal (Preliminary result)   Collection Time: 01/19/21 11:40 PM   Specimen: Urine, Clean Catch  Result Value Ref Range Status   Specimen Description   Final    URINE, CLEAN CATCH Performed at [facility], [address], [place], [place] [address]    Special Requests   Final    NONE Performed at [facility], [address], [place], [place] [address]    Culture (A)  Final    >=100,000 COLONIES/mL Romie Minus NEGATIVE RODS SUSCEPTIBILITIES TO  FOLLOW Performed at [facility], [address], [place], [place] [address]    Report Status PENDING  Incomplete  Resp Panel by RT-PCR (Flu A&B, Covid) Nasopharyngeal Swab     Status: None   Collection Time: 01/20/21  1:43 AM   Specimen: Nasopharyngeal Swab; Nasopharyngeal(NP) swabs in vial transport medium  Result Value Ref Range Status   SARS Coronavirus 2 by RT PCR NEGATIVE NEGATIVE Final    Comment: (NOTE) SARS-CoV-2 target nucleic acids are NOT DETECTED.  The SARS-CoV-2 RNA is generally detectable in upper respiratory specimens during the acute phase of infection. The lowest concentration of SARS-CoV-2 viral copies this assay can detect is 138 copies/mL. A negative result does not preclude SARS-Cov-2 infection and should not be used as the sole basis for treatment or other patient management decisions. A negative result may occur with  improper specimen collection/handling, submission of specimen other than nasopharyngeal swab, presence of viral mutation(s) within the areas targeted by this assay, and inadequate number of viral copies(<138 copies/mL). A negative result must be combined with clinical observations, patient history, and epidemiological information. The expected result is Negative.  Fact Sheet for Patients:  BloggerCourse.com  Fact Sheet for Healthcare Providers:  SeriousBroker.it  This test is no t yet approved or cleared by the [place] FDA and  has been authorized for detection and/or diagnosis of SARS-CoV-2 by FDA under an Emergency Use Authorization (EUA). This EUA will remain  in effect (meaning this test can be used) for the duration of the COVID-19 declaration under Section 564(b)(1) of the Act, 21 U.S.C.section 360bbb-3(b)(1), unless the authorization is terminated  or revoked sooner.       Influenza A by PCR NEGATIVE NEGATIVE Final   Influenza B by PCR NEGATIVE NEGATIVE Final     Comment: (NOTE) The Xpert Xpress SARS-CoV-2/FLU/RSV plus assay is intended as an aid in the diagnosis of influenza from Nasopharyngeal swab specimens and should not be used as a sole basis for treatment. Nasal washings and aspirates are unacceptable for Xpert Xpress SARS-CoV-2/FLU/RSV testing.  Fact Sheet for Patients: BloggerCourse.com  Fact Sheet for Healthcare Providers: SeriousBroker.it  This test is not yet approved or cleared by the [place] FDA and has been authorized for detection and/or diagnosis of SARS-CoV-2 by FDA under an Emergency Use Authorization (EUA). This EUA will remain in effect (meaning this test can be used) for the duration of the COVID-19 declaration under Section 564(b)(1) of the Act, 21  U.S.C. section 360bbb-3(b)(1), unless the authorization is terminated or revoked.  Performed at [facility], [address] [place], [place] [address]   CULTURE, BLOOD (ROUTINE X 2) w Reflex to ID Panel     Status: None (Preliminary result)   Collection Time: 01/20/21  9:55 AM   Specimen: BLOOD  Result Value Ref Range Status   Specimen Description BLOOD RIGHT ANTECUBITAL  Final   Special Requests   Final    BOTTLES DRAWN AEROBIC ONLY Blood Culture adequate volume   Culture   Final    NO GROWTH 2 DAYS Performed at [facility], [address], [place], [place] [address]    Report Status PENDING  Incomplete  CULTURE, BLOOD (ROUTINE X 2) w Reflex to ID Panel     Status: None (Preliminary result)   Collection Time: 01/20/21 10:20 AM   Specimen: BLOOD RIGHT HAND  Result Value Ref Range Status   Specimen Description BLOOD RIGHT HAND  Final   Special Requests   Final    BOTTLES DRAWN AEROBIC AND ANAEROBIC Blood Culture adequate volume   Culture   Final    NO GROWTH 2 DAYS Performed at [facility], [address], [place], [place] [address]    Report Status PENDING  Incomplete          Radiology Studies: DG Chest Port 1 View  Result Date: 01/22/2021 CLINICAL DATA:  Hepatitis, cirrhosis, hypoxia EXAM: PORTABLE CHEST 1 VIEW COMPARISON:  01/20/2021 FINDINGS: Single frontal view of the chest demonstrates an enlarged cardiac silhouette. There is progressive central vascular congestion with worsening interstitial and ground-glass opacities throughout the lungs. Small bilateral pleural effusions are now seen. No pneumothorax. IMPRESSION: 1. Findings consistent with worsening volume status and progressive pulmonary edema. Electronically Signed   By: Sharlet Salina M.D.   On: 01/22/2021 18:14   [place] ASCITES (ABDOMEN LIMITED)  Result Date: 01/21/2021 CLINICAL DATA:  Assessment for ascites. EXAM: LIMITED ABDOMEN ULTRASOUND FOR ASCITES TECHNIQUE: Limited ultrasound survey for ascites was performed in all four abdominal quadrants. COMPARISON:  CT of the abdomen and pelvis on 01/20/2021 FINDINGS: Survey of the peritoneal cavity demonstrates only a trace amount of ascites in the lower pelvis. No accessible pocket of ascites is identified for paracentesis. IMPRESSION: Trace ascites in the pelvis. No accessible pocket of fluid for paracentesis. Electronically Signed   By: Irish Lack M.D.   On: 01/21/2021 10:54        Scheduled Meds:  folic acid  1 mg Oral Daily   lactulose  300 mL Rectal BID   LORazepam  0-4 mg Intravenous Q12H   multivitamin with minerals  1 tablet Oral Daily   pantoprazole  40 mg Oral Q1200   Continuous Infusions:  sodium chloride 10 mL/hr at 01/23/21 0511   thiamine injection 500 mg (01/23/21 0511)     LOS: 3 days    Time spent: 25 minutes    Tresa Moore, MD [facility]   If 7PM-7AM, please contact night-coverage  01/23/2021, 10:36 AM

## 2021-01-24 ENCOUNTER — Inpatient Hospital Stay: Payer: BC Managed Care – PPO

## 2021-01-24 DIAGNOSIS — G9341 Metabolic encephalopathy: Secondary | ICD-10-CM | POA: Diagnosis not present

## 2021-01-24 LAB — COMPREHENSIVE METABOLIC PANEL
ALT: 67 U/L — ABNORMAL HIGH (ref 0–44)
AST: 225 U/L — ABNORMAL HIGH (ref 15–41)
Albumin: 2.1 g/dL — ABNORMAL LOW (ref 3.5–5.0)
Alkaline Phosphatase: 138 U/L — ABNORMAL HIGH (ref 38–126)
Anion gap: 8 (ref 5–15)
BUN: 14 mg/dL (ref 6–20)
CO2: 30 mmol/L (ref 22–32)
Calcium: 8.6 mg/dL — ABNORMAL LOW (ref 8.9–10.3)
Chloride: 105 mmol/L (ref 98–111)
Creatinine, Ser: 0.47 mg/dL (ref 0.44–1.00)
GFR, Estimated: >60 mL/min (ref >60)
Glucose, Bld: 121 mg/dL — ABNORMAL HIGH (ref 70–99)
Potassium: 3.2 mmol/L — ABNORMAL LOW (ref 3.5–5.1)
Sodium: 143 mmol/L (ref 135–145)
Total Bilirubin: 9.6 mg/dL — ABNORMAL HIGH (ref 0.3–1.2)
Total Protein: 6 g/dL — ABNORMAL LOW (ref 6.5–8.1)

## 2021-01-24 LAB — PREPARE RBC (CROSSMATCH)

## 2021-01-24 LAB — CBC WITH DIFFERENTIAL/PLATELET
Abs Immature Granulocytes: 0.16 10*3/uL — ABNORMAL HIGH (ref 0.00–0.07)
Basophils Absolute: 0.1 10*3/uL (ref 0.0–0.1)
Basophils Relative: 1 %
Eosinophils Absolute: 0.2 10*3/uL (ref 0.0–0.5)
Eosinophils Relative: 1 %
HCT: 21.8 % — ABNORMAL LOW (ref 36.0–46.0)
Hemoglobin: 7 g/dL — ABNORMAL LOW (ref 12.0–15.0)
Immature Granulocytes: 1 %
Lymphocytes Relative: 9 %
Lymphs Abs: 1.5 10*3/uL (ref 0.7–4.0)
MCH: 43.2 pg — ABNORMAL HIGH (ref 26.0–34.0)
MCHC: 32.1 g/dL (ref 30.0–36.0)
MCV: 134.6 fL — ABNORMAL HIGH (ref 80.0–100.0)
Monocytes Absolute: 1.4 10*3/uL — ABNORMAL HIGH (ref 0.1–1.0)
Monocytes Relative: 9 %
Neutro Abs: 13.3 10*3/uL — ABNORMAL HIGH (ref 1.7–7.7)
Neutrophils Relative %: 79 %
Platelets: 143 10*3/uL — ABNORMAL LOW (ref 150–400)
RBC: 1.62 MIL/uL — ABNORMAL LOW (ref 3.87–5.11)
RDW: 19.5 % — ABNORMAL HIGH (ref 11.5–15.5)
WBC: 16.6 10*3/uL — ABNORMAL HIGH (ref 4.0–10.5)
nRBC: 0 % (ref 0.0–0.2)

## 2021-01-24 LAB — GLUCOSE, CAPILLARY
Glucose-Capillary: 107 mg/dL — ABNORMAL HIGH (ref 70–99)
Glucose-Capillary: 118 mg/dL — ABNORMAL HIGH (ref 70–99)
Glucose-Capillary: 116 mg/dL — ABNORMAL HIGH (ref 70–99)
Glucose-Capillary: 117 mg/dL — ABNORMAL HIGH (ref 70–99)
Glucose-Capillary: 111 mg/dL — ABNORMAL HIGH (ref 70–99)

## 2021-01-24 LAB — BLOOD GAS, ARTERIAL
Acid-Base Excess: 7.5 mmol/L — ABNORMAL HIGH (ref 0.0–2.0)
Acid-Base Excess: 10.6 mmol/L — ABNORMAL HIGH (ref 0.0–2.0)
Bicarbonate: 31.1 mmol/L — ABNORMAL HIGH (ref 20.0–28.0)
Bicarbonate: 34.3 mmol/L — ABNORMAL HIGH (ref 20.0–28.0)
FIO2: 0.24
FIO2: 0.45
MECHVT: 450 mL
O2 Saturation: 91.6 %
O2 Saturation: 98 %
PEEP: 5 cmH2O
Patient temperature: 37
Patient temperature: 37
RATE: 16 resp/min
pCO2 arterial: 39 mmHg (ref 32.0–48.0)
pCO2 arterial: 41 mmHg (ref 32.0–48.0)
pH, Arterial: 7.51 — ABNORMAL HIGH (ref 7.350–7.450)
pH, Arterial: 7.53 — ABNORMAL HIGH (ref 7.350–7.450)
pO2, Arterial: 56 mmHg — ABNORMAL LOW (ref 83.0–108.0)
pO2, Arterial: 92 mmHg (ref 83.0–108.0)

## 2021-01-24 LAB — URINE CULTURE: Culture: 80000 — AB

## 2021-01-24 LAB — PHOSPHORUS: Phosphorus: 3.5 mg/dL (ref 2.5–4.6)

## 2021-01-24 LAB — MAGNESIUM: Magnesium: 1.7 mg/dL (ref 1.7–2.4)

## 2021-01-24 LAB — PROCALCITONIN: Procalcitonin: 0.45 ng/mL

## 2021-01-24 LAB — BILIRUBIN, TOTAL: Total Bilirubin: 10.1 mg/dL — ABNORMAL HIGH (ref 0.3–1.2)

## 2021-01-24 LAB — AMMONIA: Ammonia: 70 umol/L — ABNORMAL HIGH (ref 9–35)

## 2021-01-24 IMAGING — DX DG CHEST 1V PORT
1 series · 1 of 1 positions shown · non-contrast
Comparison: [DATE]

CLINICAL DATA: Intubation and enteric tube placement

EXAM:
ABDOMEN - 1 VIEW; PORTABLE CHEST - 1 VIEW

[chest ap]
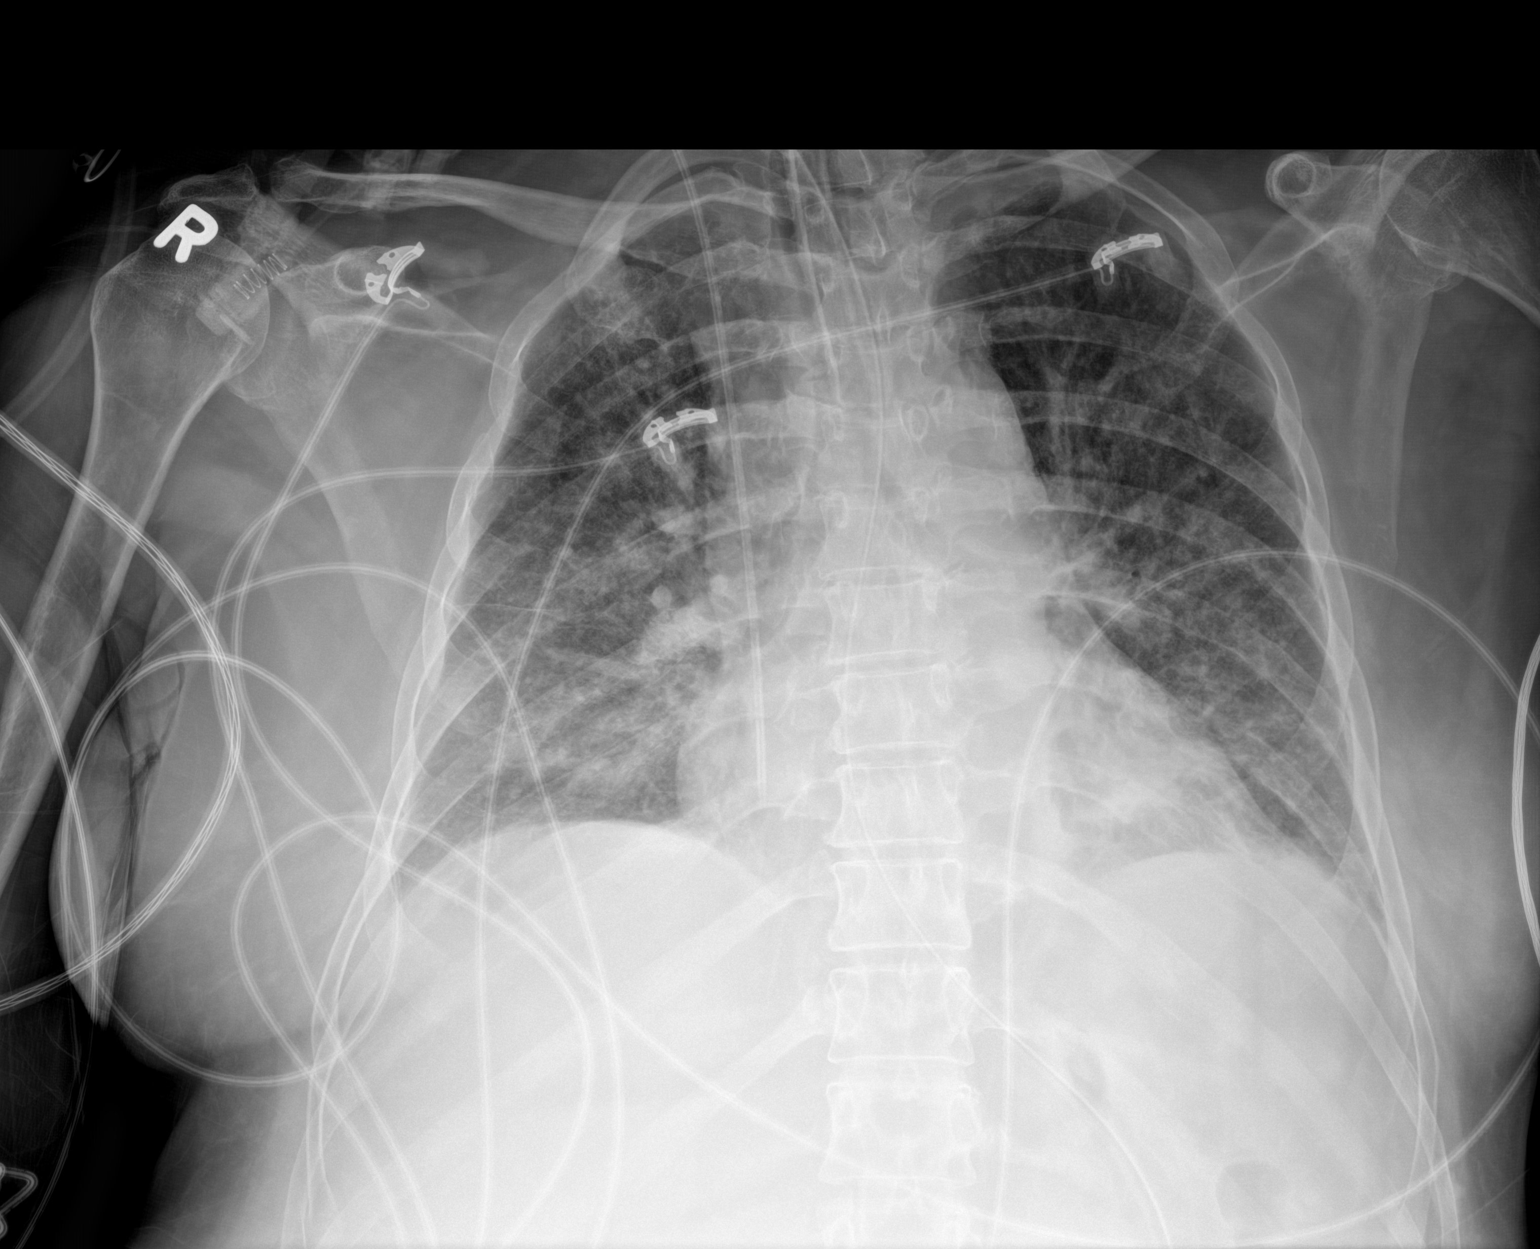

[1 of 1 positions shown; findings below may reference images not displayed]

FINDINGS: The cardiomediastinal silhouette is unchanged in contour.ETT tip
terminates 21 mm above the carina. Enteric tube tip and side port
project over the stomach. RIGHT IJ CVC tip terminates over the RIGHT
atrium. No pleural effusion. No pneumothorax. Similar appearance of
diffuse interstitial prominence with bibasilar airspace opacities.
Incomplete assessment of the pelvis. No dilated loops of bowel are
seen. Multilevel degenerative changes of the thoracolumbar spine.
IMPRESSION: Support apparatus as described above.

## 2021-01-24 IMAGING — DX DG CHEST 1V PORT
1 series · 1 of 1 positions shown · non-contrast
Comparison: [DATE]

CLINICAL DATA: Hypoxia.  Follow-up pulmonary edema.

EXAM:
PORTABLE CHEST 1 VIEW

[chest ap]
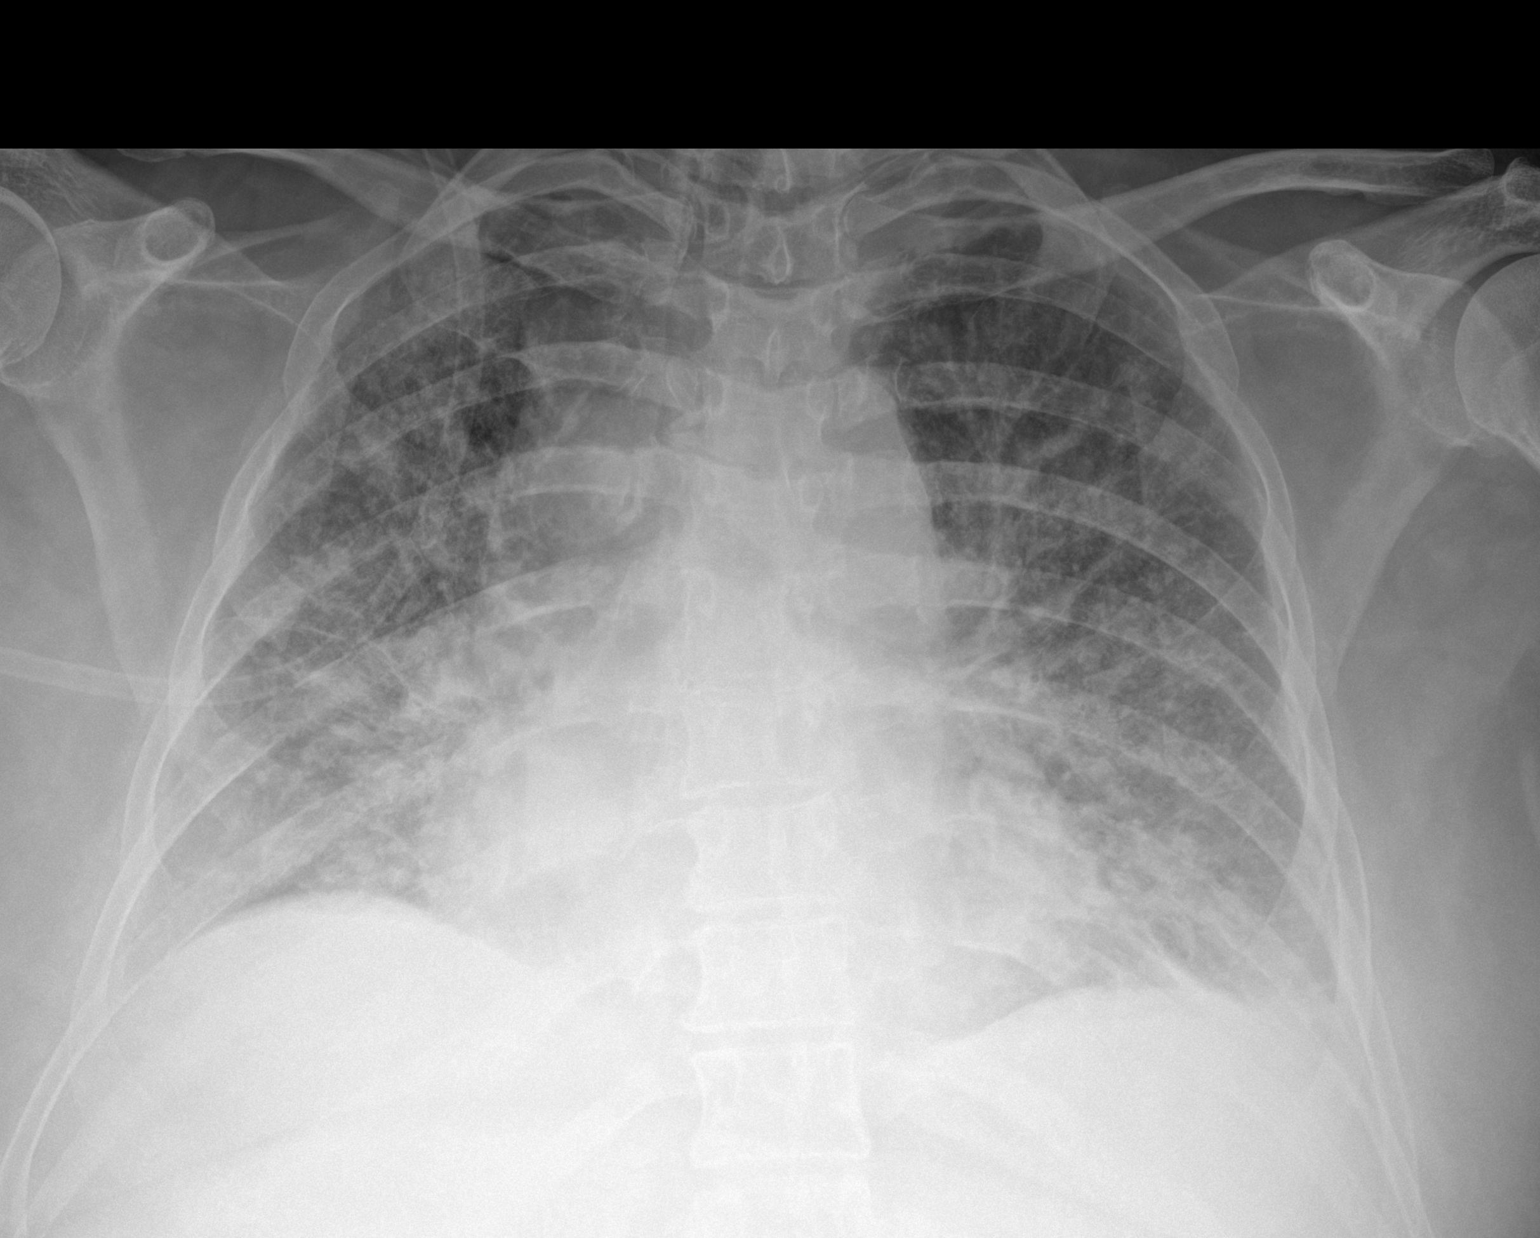

[1 of 1 positions shown; findings below may reference images not displayed]

FINDINGS: The heart is enlarged. There is pulmonary venous hypertension with
interstitial and alveolar edema, worsened since 2 days ago. No
measurable pleural effusion visible in the frontal projection. No
acute bone finding.
IMPRESSION: Congestive heart failure pattern with worsened edema.

## 2021-01-24 IMAGING — DX DG ABDOMEN 1V
1 series · 1 of 1 positions shown · non-contrast
Comparison: [DATE]

CLINICAL DATA: Intubation and enteric tube placement

EXAM:
ABDOMEN - 1 VIEW; PORTABLE CHEST - 1 VIEW

[abdomen supine]
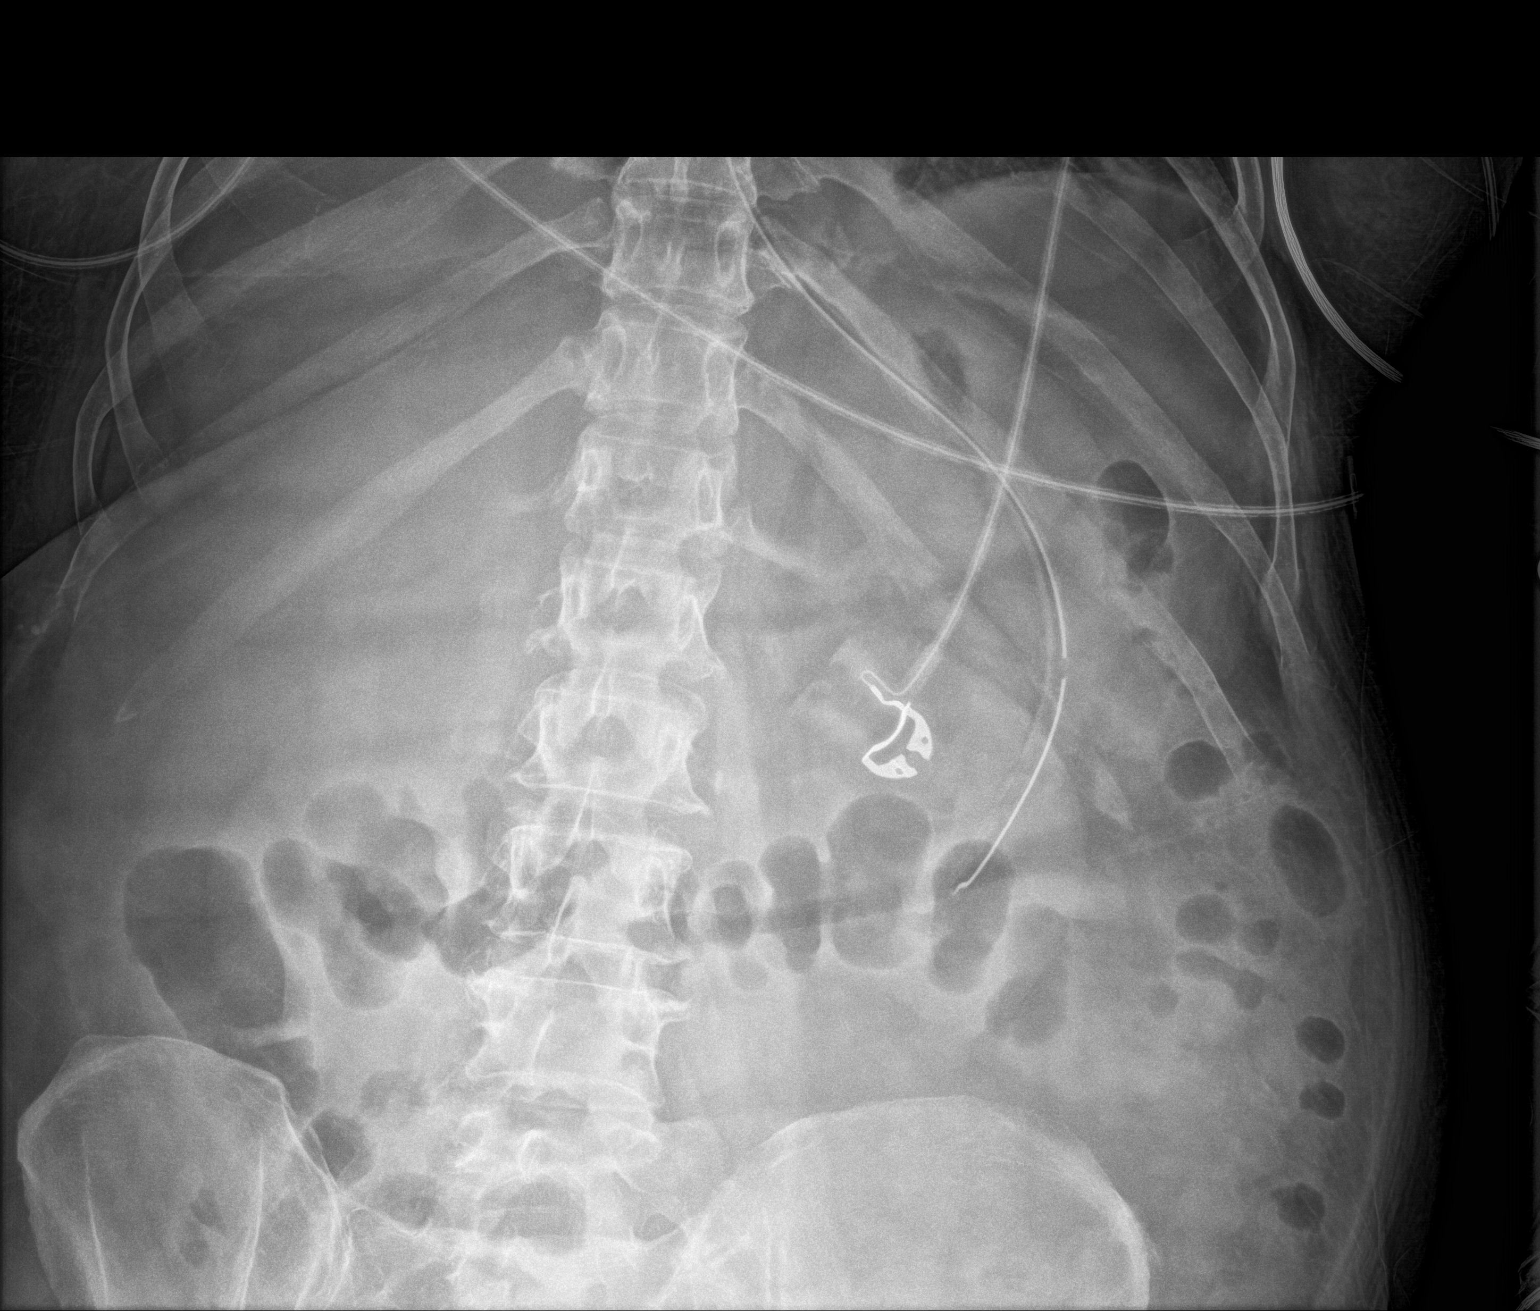

[1 of 1 positions shown; findings below may reference images not displayed]

FINDINGS: The cardiomediastinal silhouette is unchanged in contour.ETT tip
terminates 21 mm above the carina. Enteric tube tip and side port
project over the stomach. RIGHT IJ CVC tip terminates over the RIGHT
atrium. No pleural effusion. No pneumothorax. Similar appearance of
diffuse interstitial prominence with bibasilar airspace opacities.
Incomplete assessment of the pelvis. No dilated loops of bowel are
seen. Multilevel degenerative changes of the thoracolumbar spine.
IMPRESSION: Support apparatus as described above.

## 2021-01-24 MED ORDER — FOLIC ACID 1 MG PO TABS
1.0000 mg | ORAL_TABLET | Freq: Every day | ORAL | Status: DC
  Administered 2021-01-25 – 2021-02-13 (×20): 1 mg
  Filled 2021-01-24 (×20): qty 1

## 2021-01-24 MED ORDER — SODIUM CHLORIDE 0.9 % IV SOLN
1.0000 mg | Freq: Once | INTRAVENOUS | Status: AC
  Administered 2021-01-24: 1 mg via INTRAVENOUS
  Filled 2021-01-24: qty 0.2

## 2021-01-24 MED ORDER — LACTULOSE 10 GM/15ML PO SOLN
10.0000 g | Freq: Two times a day (BID) | ORAL | Status: DC
  Administered 2021-01-24: 10 g via ORAL
  Filled 2021-01-24: qty 30

## 2021-01-24 MED ORDER — ROCURONIUM BROMIDE 10 MG/ML (PF) SYRINGE
PREFILLED_SYRINGE | INTRAVENOUS | Status: AC
  Administered 2021-01-24: 50 mg via INTRAVENOUS
  Filled 2021-01-24: qty 10

## 2021-01-24 MED ORDER — FUROSEMIDE 10 MG/ML IJ SOLN
40.0000 mg | Freq: Once | INTRAMUSCULAR | Status: AC
  Administered 2021-01-24: 40 mg via INTRAVENOUS
  Filled 2021-01-24: qty 4

## 2021-01-24 MED ORDER — ZIPRASIDONE MESYLATE 20 MG IM SOLR
10.0000 mg | Freq: Once | INTRAMUSCULAR | Status: AC
  Administered 2021-01-24: 10 mg via INTRAMUSCULAR
  Filled 2021-01-24: qty 20

## 2021-01-24 MED ORDER — LACTULOSE 10 GM/15ML PO SOLN
30.0000 g | Freq: Two times a day (BID) | ORAL | Status: DC
  Administered 2021-01-24: 30 g
  Filled 2021-01-24: qty 60

## 2021-01-24 MED ORDER — POLYETHYLENE GLYCOL 3350 17 G PO PACK
17.0000 g | PACK | Freq: Every day | ORAL | Status: DC
Start: 2021-01-24 — End: 2021-01-28
  Administered 2021-01-24 – 2021-01-27 (×4): 17 g
  Filled 2021-01-24 (×4): qty 1

## 2021-01-24 MED ORDER — DOCUSATE SODIUM 50 MG/5ML PO LIQD
100.0000 mg | Freq: Two times a day (BID) | ORAL | Status: DC
  Administered 2021-01-24 – 2021-01-27 (×6): 100 mg
  Filled 2021-01-24 (×6): qty 10

## 2021-01-24 MED ORDER — FENTANYL CITRATE (PF) 100 MCG/2ML IJ SOLN: 150.0000 ug | Freq: Once | INTRAMUSCULAR | Status: DC

## 2021-01-24 MED ORDER — ALBUMIN HUMAN 25 % IV SOLN
12.5000 g | Freq: Every day | INTRAVENOUS | Status: DC
  Filled 2021-01-24: qty 50

## 2021-01-24 MED ORDER — ALBUMIN HUMAN 25 % IV SOLN
25.0000 g | Freq: Four times a day (QID) | INTRAVENOUS | Status: AC
Start: 2021-01-24 — End: 2021-01-25
  Administered 2021-01-24 – 2021-01-25 (×4): 25 g via INTRAVENOUS
  Filled 2021-01-24 (×3): qty 100

## 2021-01-24 MED ORDER — OXYCODONE HCL 5 MG PO TABS
5.0000 mg | ORAL_TABLET | ORAL | Status: DC | PRN
Start: 2021-01-24 — End: 2021-01-30

## 2021-01-24 MED ORDER — POTASSIUM CHLORIDE 20 MEQ PO PACK
40.0000 meq | PACK | Freq: Once | ORAL | Status: AC
  Administered 2021-01-24: 40 meq
  Filled 2021-01-24: qty 2

## 2021-01-24 MED ORDER — SODIUM CHLORIDE 0.9% FLUSH: 10.0000 mL | INTRAVENOUS | Status: DC | PRN

## 2021-01-24 MED ORDER — NOREPINEPHRINE 4 MG/250ML-% IV SOLN
0.0000 ug/min | INTRAVENOUS | Status: DC
  Administered 2021-01-27 – 2021-01-28 (×3): 2 ug/min via INTRAVENOUS
  Administered 2021-01-29: 3 ug/min via INTRAVENOUS
  Filled 2021-01-24 (×3): qty 250

## 2021-01-24 MED ORDER — FENTANYL 2500MCG IN NS 250ML (10MCG/ML) PREMIX INFUSION
0.0000 ug/h | INTRAVENOUS | Status: DC
  Administered 2021-01-24: 100 ug/h via INTRAVENOUS
  Administered 2021-01-25: 175 ug/h via INTRAVENOUS
  Administered 2021-01-26: 300 ug/h via INTRAVENOUS
  Administered 2021-01-26: 200 ug/h via INTRAVENOUS
  Administered 2021-01-27 (×2): 300 ug/h via INTRAVENOUS
  Administered 2021-01-28: 100 ug/h via INTRAVENOUS
  Filled 2021-01-24 (×6): qty 250

## 2021-01-24 MED ORDER — SODIUM CHLORIDE 0.9 % IV SOLN
2.0000 g | INTRAVENOUS | Status: DC
  Administered 2021-01-24: 2 g via INTRAVENOUS
  Filled 2021-01-24: qty 20

## 2021-01-24 MED ORDER — SODIUM CHLORIDE 0.9 % IV SOLN
2.0000 g | INTRAVENOUS | Status: AC
  Administered 2021-01-25 – 2021-01-29 (×5): 2 g via INTRAVENOUS
  Filled 2021-01-24: qty 2
  Filled 2021-01-24: qty 20
  Filled 2021-01-24 (×4): qty 2

## 2021-01-24 MED ORDER — DEXAMETHASONE SODIUM PHOSPHATE 4 MG/ML IJ SOLN
4.0000 mg | Freq: Two times a day (BID) | INTRAMUSCULAR | Status: DC
  Administered 2021-01-24 – 2021-01-25 (×3): 4 mg via INTRAVENOUS
  Filled 2021-01-24 (×3): qty 1

## 2021-01-24 MED ORDER — MIDAZOLAM HCL 2 MG/2ML IJ SOLN: 4.0000 mg | Freq: Once | INTRAMUSCULAR | Status: AC

## 2021-01-24 MED ORDER — ORAL CARE MOUTH RINSE
15.0000 mL | OROMUCOSAL | Status: DC
  Administered 2021-01-24 – 2021-02-13 (×193): 15 mL via OROMUCOSAL

## 2021-01-24 MED ORDER — MIDAZOLAM HCL 2 MG/2ML IJ SOLN
INTRAMUSCULAR | Status: AC
  Administered 2021-01-24: 4 mg via INTRAVENOUS
  Filled 2021-01-24: qty 4

## 2021-01-24 MED ORDER — ADULT MULTIVITAMIN W/MINERALS CH
1.0000 | ORAL_TABLET | Freq: Every day | ORAL | Status: DC
  Administered 2021-01-25 – 2021-02-03 (×10): 1
  Filled 2021-01-24 (×11): qty 1

## 2021-01-24 MED ORDER — CHLORHEXIDINE GLUCONATE 0.12% ORAL RINSE (MEDLINE KIT)
15.0000 mL | Freq: Two times a day (BID) | OROMUCOSAL | Status: DC
  Administered 2021-01-24 – 2021-02-13 (×40): 15 mL via OROMUCOSAL

## 2021-01-24 MED ORDER — ROCURONIUM BROMIDE 50 MG/5ML IV SOLN
50.0000 mg | Freq: Once | INTRAVENOUS | Status: AC
  Filled 2021-01-24: qty 5

## 2021-01-24 MED ORDER — SODIUM CHLORIDE 0.9% IV SOLUTION: Freq: Once | INTRAVENOUS | Status: AC

## 2021-01-24 MED ORDER — NOREPINEPHRINE 4 MG/250ML-% IV SOLN
INTRAVENOUS | Status: AC
  Administered 2021-01-24: 2 ug/min via INTRAVENOUS
  Filled 2021-01-24: qty 250

## 2021-01-24 MED ORDER — FENTANYL CITRATE PF 50 MCG/ML IJ SOSY
PREFILLED_SYRINGE | INTRAMUSCULAR | Status: AC
  Administered 2021-01-24: 150 ug
  Filled 2021-01-24: qty 3

## 2021-01-24 MED ORDER — CHLORHEXIDINE GLUCONATE CLOTH 2 % EX PADS
6.0000 | MEDICATED_PAD | Freq: Every day | CUTANEOUS | Status: DC
  Administered 2021-01-24 – 2021-02-13 (×21): 6 via TOPICAL

## 2021-01-24 MED ORDER — FENTANYL 2500MCG IN NS 250ML (10MCG/ML) PREMIX INFUSION
INTRAVENOUS | Status: AC
  Filled 2021-01-24: qty 250

## 2021-01-24 MED ORDER — SODIUM CHLORIDE 0.9% FLUSH
10.0000 mL | Freq: Two times a day (BID) | INTRAVENOUS | Status: DC
  Administered 2021-01-24: 10 mL
  Administered 2021-01-25 – 2021-01-26 (×3): 30 mL
  Administered 2021-01-26 – 2021-01-28 (×4): 10 mL
  Administered 2021-01-28: 30 mL
  Administered 2021-01-29 – 2021-02-13 (×26): 10 mL

## 2021-01-24 MED ORDER — THIAMINE HCL 100 MG/ML IJ SOLN
100.0000 mg | Freq: Every day | INTRAMUSCULAR | Status: DC
  Administered 2021-01-25 – 2021-02-06 (×13): 100 mg via INTRAVENOUS
  Filled 2021-01-24 (×13): qty 2

## 2021-01-24 MED ORDER — SODIUM CHLORIDE 0.9 % IV SOLN
3.0000 g | Freq: Four times a day (QID) | INTRAVENOUS | Status: DC
  Administered 2021-01-24: 3 g via INTRAVENOUS
  Filled 2021-01-24 (×4): qty 8

## 2021-01-24 MED ORDER — MAGNESIUM SULFATE 2 GM/50ML IV SOLN
2.0000 g | Freq: Once | INTRAVENOUS | Status: AC
  Administered 2021-01-24: 2 g via INTRAVENOUS
  Filled 2021-01-24: qty 50

## 2021-01-24 MED ORDER — DEXMEDETOMIDINE HCL IN NACL 400 MCG/100ML IV SOLN
0.4000 ug/kg/h | INTRAVENOUS | Status: DC
  Administered 2021-01-24 – 2021-01-25 (×3): 0.7 ug/kg/h via INTRAVENOUS
  Administered 2021-01-25: 0.5 ug/kg/h via INTRAVENOUS
  Administered 2021-01-25: 0.7 ug/kg/h via INTRAVENOUS
  Administered 2021-01-26: 0.6 ug/kg/h via INTRAVENOUS
  Administered 2021-01-26 – 2021-01-28 (×6): 0.7 ug/kg/h via INTRAVENOUS
  Administered 2021-01-28 – 2021-01-29 (×3): 0.9 ug/kg/h via INTRAVENOUS
  Administered 2021-01-29 (×2): 1.1 ug/kg/h via INTRAVENOUS
  Filled 2021-01-24 (×17): qty 100

## 2021-01-24 MED ORDER — LORAZEPAM 2 MG/ML IJ SOLN
1.0000 mg | INTRAMUSCULAR | Status: DC | PRN
  Administered 2021-01-25: 2 mg via INTRAVENOUS
  Filled 2021-01-24: qty 1

## 2021-01-24 MED ORDER — ALBUMIN HUMAN 25 % IV SOLN
12.5000 g | Freq: Every day | INTRAVENOUS | Status: DC
  Administered 2021-01-24: 12.5 g via INTRAVENOUS
  Filled 2021-01-24: qty 50

## 2021-01-24 NOTE — Procedures (Signed)
Endotracheal Intubation: Patient required placement of an artificial airway secondary to Respiratory Failure  Consent: Emergent.   Hand washing performed prior to starting the procedure.   Medications administered for sedation prior to procedure:  Midazolam 4 mg IV,  Rocuronium 45m IV, Fentanyl 150 mcg IV.    A time out procedure was called and correct patient, name, & ID confirmed. Needed supplies and equipment were assembled and checked to include ETT, 10 ml syringe, Glidescope, Mac and Miller blades, suction, oxygen and bag mask valve, end tidal CO2 monitor.   Patient was positioned to align the mouth and pharynx to facilitate visualization of the glottis.   Heart rate, SpO2 and blood pressure was continuously monitored during the procedure. Pre-oxygenation was conducted prior to intubation and endotracheal tube was placed through the vocal cords into the trachea.     The artificial airway was placed under direct visualization via glidescope route using a 8 ETT on the first attempt.  ETT was secured at 23 cm mark.  Placement was confirmed by auscuitation of lungs with good breath sounds bilaterally and no stomach sounds.  Condensation was noted on endotracheal tube.   Pulse ox 98%.  CO2 detector in place with appropriate color change.   Complications: None .     Chest radiograph ordered and pending.   Comments: OGT placed via glidescope.   FOttie Glazier M.D.  Pulmonary & [facility]

## 2021-01-24 NOTE — Consult Note (Signed)
Pharmacy Antibiotic Note  [name] Carrillo is a 55 y.o. female with PMH of  depression with anxiety, alcohol abuse who was admitted on 01/20/2021 with  AMS, jaundice and abdominal pain . Pharmacy has been consulted for Unasyn dosing for concern of aspiration pneumonia.  Afeb, WBC 14.1>14.9>16.6  Bcx NG x4 days, Ucx 80,000 CFU klebsiella pneumoniae (resistant only to ampicillin) and 70,000 CFU E. Coli (pansensitive)  Plan: Unasyn -Will start Unasyn 3g IV q6h  Will continue to monitor and adjust dose as clinically indicated   Height: 5\' 5"  (165.1 cm) Weight: 81.6 kg (180 lb) IBW/kg (Calculated) : 57  Temp (24hrs), Avg:98.3 F (36.8 C), Min:97.8 F (36.6 C), Max:98.9 F (37.2 C)  Recent Labs  Lab 01/19/21 1341 01/20/21 0955 01/20/21 2316 01/21/21 0722 01/22/21 0758 01/23/21 0414 01/24/21 0522  WBC 12.9*  --   --   --  14.1* 14.9* 16.6*  CREATININE 0.75   < > 0.54 0.54 0.51 0.61 0.47   < > = values in this interval not displayed.    Estimated Creatinine Clearance: 82.8 mL/min (by C-G formula based on SCr of 0.47 mg/dL).    No Known Allergies  Antimicrobials this admission: Ongoing Unasyn 10/23 >>   Complete Ceftriaxone 10/19 >> 10/23  Microbiology results: 10/19 BCx: NG x4 days 10/18 UCx: 80,000 CFU klebsiella pneumoniae (resistant only to ampicillin) and 70,000 CFU E. Coli (pansensitive)   Thank you for allowing pharmacy to be a part of this patient's care.  11/18, PharmD Pharmacy Resident  01/24/2021 2:10 PM

## 2021-01-24 NOTE — Procedures (Signed)
Central Venous Catheter Placement:  TRIPLE LUMEN     Procedure: Insertion of Non-tunneled Central Venous Catheter(36556) with US guidance (32440)    Indication(s) Medication administration and Difficult access  CVP monitoring Patient receiving vesicant or irritant drug.; Patient receiving intravenous therapy for longer than 5 days.; Patient has limited or no vascular access.      Consent Risks of the procedure as well as the alternatives and risks of each were explained to the patient and/or caregiver.  Consent for the procedure was obtained and is signed in the bedside chart   Consent-written signed by husband in chart         Timeout Verified patient identification, verified procedure, site/side was marked, verified correct patient position, special equipment/implants available, medications/allergies/relevant history reviewed, required imaging and test results available. Patient comfort was obtained.     Sterile Technique Maximal sterile technique including full sterile barrier drape, hand hygiene, sterile gown, sterile gloves, mask, hair covering, sterile ultrasound probe cover (if used).   Hand washing performed prior to starting the procedure.      Procedure Description Area of catheter insertion was cleaned with chlorhexidine and draped in sterile fashion.  With real-time ultrasound guidance a central venous catheter was placed into the right internal jugular vein. Nonpulsatile blood flow and easy flushing noted in all ports.  The catheter was sutured in place and sterile dressing applied.   A triple lumen catheter was placed in R Jug Vein There was good blood return, catheter caps were placed on lumens, catheter flushed easily, the line was secured and a sterile dressing and BIO-PATCH applied.      Complications/Tolerance None; patient tolerated the procedure well. Chest X-ray is ordered to verify placement     EBL Minimal   Specimen(s) None     Number of  Attempts: 1 Complications:none Estimated Blood Loss: none    Vida Rigger, M.D.  Pulmonary & Critical Care Medicine  [facility]

## 2021-01-24 NOTE — Progress Notes (Signed)
Patient transferred to ICU from floor due to increased agitation.  Placed on monitor and Oxygen on at 2L [place].  Confused and nonverbal upon arrival.  Placed on precidex gtt.  Dr. Karna Christmas at bedside upon arrival.  POC continued.

## 2021-01-24 NOTE — Progress Notes (Signed)
PROGRESS NOTE    [name] Carrillo  KVQ:[id number] DOB: 14-Sep-1965 DOA: 01/20/2021 PCP: Patient, No Pcp Per (Inactive)    Brief Narrative:   55 y.o. female with medical history significant of depression with anxiety, alcohol abuse, who presented with altered mental status, jaundice, abdominal pain.   Per her husband at the bedside, patient has been intermittently confused for almost a week.  Patient moves all extremities normally.  No facial droop or slurred speech.  She has malaise and weakness.  She has jaundice with yellow eyes.  Patient has mild pain in upper abdomen.  Denies nausea, vomiting or diarrhea.  Denies dark stool or rectal bleeding.  Patient denies chest pain, cough, shortness breath.  Patient states that she has difficulty urinating, she states she is not sure if she has burning or dysuria. When I saw patient in ED, patient is lethargic, but orientated x3.  She moves all extremities normally  Started on lactulose with marked improvement in encephalopathy and lethargy as well as weakness.  Seen in consultation by gastroenterology.  Recommendations appreciated.  10/21: Last night patient became more agitated and confused.  Unsteady on feet.  Attempting to get up out of bed.  Signs and symptoms consistent with acute alcohol withdrawal.  Started on as needed Ativan and scheduled diazepam for withdrawal symptoms.  More lethargic this morning.  10/22: Patient remains lethargic and encephalopathic.  Intermittently agitated   Assessment & Plan:   Principal Problem:   Acute metabolic encephalopathy Active Problems:   Cirrhosis of liver (HCC)   Depression with anxiety   Alcohol abuse   Acute hepatic encephalopathy   Abnormal LFTs   Hyponatremia   UTI (urinary tract infection)   Macrocytic anemia   Leukocytosis   Acute respiratory failure with hypoxia (HCC)  Acute metabolic encephalopathy Hepatic encephalopathy Suspected hepatic encephalopathy versus withdrawal versus  benzodiazepine effect CT head negative Difficult to exclude UTI as contributing factor Acute alcohol withdrawal noted 10/20, also clouding clinical picture Completed IV Rocephin for empiric UTI treatment Intermittently tolerating p.o. Uptrending white blood cell count Plan: Continue lactulose either p.o. or PR.  Titrate to 2-4 soft bowel movements daily One-to-one sitter CIWA protocol as below  Acute alcohol withdrawal CIWA score increased to 14 on 10/20 Patient has been mainly lethargic during the day Intermittently confused and agitated Plan: CIWA protocol with as needed IV Ativan High-dose IV thiamine, 500 IV 3 times daily x3 days, followed by 100 mg daily Folic acid Daily multivitamin    Cirrhosis of liver and abnormal liver function Most likely due to alcohol abuse No accessible fluid pocket for safe paracentesis Uptrending white blood cell count, unclear etiology Plan: No indication for steroids Restart empiric Rocephin Educate on alcohol abuse   Depression with anxiety Patient's not taking medications currently -Observe closely   Alcohol abuse -Counseled extensively on importance of alcohol cessation   Hyponatremia  Sodium 124 on admission, suspect beer potomania Improved   Possible UTI (urinary tract infection) -IV Rocephin, last dose 10/21    Macrocytic anemia Hemoglobin 8.4 (of 14.2 on 08/01/2019) patient denies dark stool or rectal bleeding No indication for transfusion at this time Monitor H&H, transfuse for hemoglobin less than 7  Acute respiratory failure with hypoxia (HCC)  Oxygen desaturated to 88% on room air, currently 95% on 2 L oxygen.   Patient denies shortness of breath or chest pain, cough.   Chest x-ray negative.  Etiology is not clear, may be due to altered mental status. Plan: As needed nebulizer  Wean oxygen as tolerated   DVT prophylaxis: SCD Code Status: Full code Family Communication: Joycelyn Man spouse [phone] on  10/23 Disposition Plan: Status is: Inpatient   Remains inpatient appropriate because: Acute alcoholic hepatitis.  Now with alcohol withdrawal and persistent encephalopathy.  Disposition plan pending.   Level of care: Med-Surg  Consultants:  GI  Procedures:  None  Antimicrobials: Ceftriaxone   Subjective: Patient seen and examined.  Remains lethargic.  Awakens intermittently.  Confused.  Objective: Vitals:   01/23/21 1643 01/23/21 2109 01/24/21 0328 01/24/21 0828  BP: (!) 97/58 (!) 104/50 (!) 107/54 93/76  Pulse: 95 88 87 84  Resp: 16  (!) 24 18  Temp: 98.9 F (37.2 C) 98.5 F (36.9 C) 97.8 F (36.6 C) 97.8 F (36.6 C)  TempSrc: Oral  Oral   SpO2: 97%  99% 99%  Weight:      Height:        Intake/Output Summary (Last 24 hours) at 01/24/2021 0938 Last data filed at 01/23/2021 1837 Gross per 24 hour  Intake 0 ml  Output --  Net 0 ml   Filed Weights   01/19/21 1338  Weight: 81.6 kg    Examination:  General exam: Sleepy.  Confused Respiratory system: Bibasilar crackles.  Normal work of breathing.  2 L Cardiovascular system: S1-S2, RRR, no murmurs, no pedal edema Gastrointestinal system: Soft, nontender, mild distention, positive bowel sounds Central nervous system: Confused.  Oriented to person only.  No focal deficits Extremities: Symmetric 5 x 5 power.  Mild tremor Skin: Mildly icteric Psychiatry: Judgement and insight appear impaired. Mood & affect flattened.     Data Reviewed: I have personally reviewed following labs and imaging studies  CBC: Recent Labs  Lab 01/19/21 1341 01/22/21 0758 01/23/21 0414 01/24/21 0522  WBC 12.9* 14.1* 14.9* 16.6*  NEUTROABS  --  10.8* 11.4* 13.3*  HGB 8.4* 7.0* 7.4* 7.0*  HCT 23.9* 21.1* 22.6* 21.8*  MCV 127.1* 131.9* 133.7* 134.6*  PLT 150 141* 148* 143*   Basic Metabolic Panel: Recent Labs  Lab 01/20/21 0955 01/20/21 1526 01/20/21 2316 01/21/21 0722 01/22/21 0758 01/23/21 0414 01/24/21 0522  NA  129*   < > 130* 131* 137 139 143  K 3.4*   < > 3.1* 3.3* 3.6 3.4* 3.2*  CL 88*   < > 90* 94* 100 101 105  CO2 30   < > 29 29 29 30 30   GLUCOSE 114*   < > 109* 111* 117* 114* 121*  BUN 21*   < > 18 17 14 13 14   CREATININE 0.58   < > 0.54 0.54 0.51 0.61 0.47  CALCIUM 9.1   < > 8.8* 8.6* 8.8* 8.6* 8.6*  MG 1.7  --   --   --   --   --   --   PHOS 2.7  --   --   --   --   --   --    < > = values in this interval not displayed.   GFR: Estimated Creatinine Clearance: 82.8 mL/min (by C-G formula based on SCr of 0.47 mg/dL). Liver Function Tests: Recent Labs  Lab 01/19/21 1341 01/22/21 0758 01/23/21 0414 01/24/21 0522  AST 371* 291*  291* 265* 225*  ALT 91* 79*  81* 74* 67*  ALKPHOS 195* 150*  160* 155* 138*  BILITOT 16.0* 9.6*  9.6* 9.7* 9.6*  PROT 7.6 6.1*  6.2* 6.4* 6.0*  ALBUMIN 2.6* 2.1*  2.2* 2.1* 2.1*   Recent Labs  Lab 01/19/21 1341  LIPASE 54*   Recent Labs  Lab 01/19/21 1341 01/24/21 0819  AMMONIA 35 70*   Coagulation Profile: Recent Labs  Lab 01/20/21 0240  INR 1.3*   Cardiac Enzymes: No results for input(s): CKTOTAL, CKMB, CKMBINDEX, TROPONINI in the last 168 hours. BNP (last 3 results) No results for input(s): PROBNP in the last 8760 hours. HbA1C: No results for input(s): HGBA1C in the last 72 hours. CBG: Recent Labs  Lab 01/23/21 0734 01/23/21 1113 01/23/21 1649 01/24/21 0826  GLUCAP 107* 118* 116* 117*   Lipid Profile: No results for input(s): CHOL, HDL, LDLCALC, TRIG, CHOLHDL, LDLDIRECT in the last 72 hours. Thyroid Function Tests: No results for input(s): TSH, T4TOTAL, FREET4, T3FREE, THYROIDAB in the last 72 hours. Anemia Panel: Recent Labs    01/23/21 0414  VITAMINB12 668  FOLATE 1.7*  FERRITIN 1,466*  TIBC 119*  IRON 31   Sepsis Labs: Recent Labs  Lab 01/24/21 0819  PROCALCITON 0.45    Recent Results (from the past 240 hour(s))  Urine Culture     Status: Abnormal   Collection Time: 01/19/21 11:40 PM   Specimen:  Urine, Clean Catch  Result Value Ref Range Status   Specimen Description   Final    URINE, CLEAN CATCH Performed at [facility], [address], [place], [place] [address]    Special Requests   Final    NONE Performed at [facility], [address] [place], [place] [address]    Culture (A)  Final    80,000 COLONIES/mL KLEBSIELLA PNEUMONIAE 70,000 COLONIES/mL ESCHERICHIA COLI    Report Status 01/24/2021 FINAL  Final   Organism ID, Bacteria KLEBSIELLA PNEUMONIAE (A)  Final   Organism ID, Bacteria ESCHERICHIA COLI (A)  Final      Susceptibility   Escherichia coli - MIC*    AMPICILLIN <=2 SENSITIVE Sensitive     CEFAZOLIN <=4 SENSITIVE Sensitive     CEFEPIME <=0.12 SENSITIVE Sensitive     CEFTRIAXONE <=0.25 SENSITIVE Sensitive     CIPROFLOXACIN <=0.25 SENSITIVE Sensitive     GENTAMICIN <=1 SENSITIVE Sensitive     IMIPENEM <=0.25 SENSITIVE Sensitive     NITROFURANTOIN <=16 SENSITIVE Sensitive     TRIMETH/SULFA <=20 SENSITIVE Sensitive     AMPICILLIN/SULBACTAM <=2 SENSITIVE Sensitive     PIP/TAZO <=4 SENSITIVE Sensitive     * 70,000 COLONIES/mL ESCHERICHIA COLI   Klebsiella pneumoniae - MIC*    AMPICILLIN RESISTANT Resistant     CEFAZOLIN <=4 SENSITIVE Sensitive     CEFEPIME <=0.12 SENSITIVE Sensitive     CEFTRIAXONE <=0.25 SENSITIVE Sensitive     CIPROFLOXACIN <=0.25 SENSITIVE Sensitive     GENTAMICIN <=1 SENSITIVE Sensitive     IMIPENEM <=0.25 SENSITIVE Sensitive     NITROFURANTOIN <=16 SENSITIVE Sensitive     TRIMETH/SULFA <=20 SENSITIVE Sensitive     AMPICILLIN/SULBACTAM 4 SENSITIVE Sensitive     PIP/TAZO <=4 SENSITIVE Sensitive     * 80,000 COLONIES/mL KLEBSIELLA PNEUMONIAE  Resp Panel by RT-PCR (Flu A&B, Covid) Nasopharyngeal Swab     Status: None   Collection Time: 01/20/21  1:43 AM   Specimen: Nasopharyngeal Swab; Nasopharyngeal(NP) swabs in vial transport medium  Result Value Ref Range Status   SARS Coronavirus 2 by RT PCR NEGATIVE NEGATIVE  Final    Comment: (NOTE) SARS-CoV-2 target nucleic acids are NOT DETECTED.  The SARS-CoV-2 RNA is generally detectable in upper respiratory specimens during the acute phase of infection. The lowest concentration of SARS-CoV-2 viral copies  this assay can detect is 138 copies/mL. A negative result does not preclude SARS-Cov-2 infection and should not be used as the sole basis for treatment or other patient management decisions. A negative result may occur with  improper specimen collection/handling, submission of specimen other than nasopharyngeal swab, presence of viral mutation(s) within the areas targeted by this assay, and inadequate number of viral copies(<138 copies/mL). A negative result must be combined with clinical observations, patient history, and epidemiological information. The expected result is Negative.  Fact Sheet for Patients:  BloggerCourse.com  Fact Sheet for Healthcare Providers:  SeriousBroker.it  This test is no t yet approved or cleared by the [place] FDA and  has been authorized for detection and/or diagnosis of SARS-CoV-2 by FDA under an Emergency Use Authorization (EUA). This EUA will remain  in effect (meaning this test can be used) for the duration of the COVID-19 declaration under Section 564(b)(1) of the Act, 21 U.S.C.section 360bbb-3(b)(1), unless the authorization is terminated  or revoked sooner.       Influenza A by PCR NEGATIVE NEGATIVE Final   Influenza B by PCR NEGATIVE NEGATIVE Final    Comment: (NOTE) The Xpert Xpress SARS-CoV-2/FLU/RSV plus assay is intended as an aid in the diagnosis of influenza from Nasopharyngeal swab specimens and should not be used as a sole basis for treatment. Nasal washings and aspirates are unacceptable for Xpert Xpress SARS-CoV-2/FLU/RSV testing.  Fact Sheet for Patients: BloggerCourse.com  Fact Sheet for Healthcare  Providers: SeriousBroker.it  This test is not yet approved or cleared by the [place] FDA and has been authorized for detection and/or diagnosis of SARS-CoV-2 by FDA under an Emergency Use Authorization (EUA). This EUA will remain in effect (meaning this test can be used) for the duration of the COVID-19 declaration under Section 564(b)(1) of the Act, 21 U.S.C. section 360bbb-3(b)(1), unless the authorization is terminated or revoked.  Performed at [facility], [address] [place], [place] [address]   CULTURE, BLOOD (ROUTINE X 2) w Reflex to ID Panel     Status: None (Preliminary result)   Collection Time: 01/20/21  9:55 AM   Specimen: BLOOD  Result Value Ref Range Status   Specimen Description BLOOD RIGHT ANTECUBITAL  Final   Special Requests   Final    BOTTLES DRAWN AEROBIC ONLY Blood Culture adequate volume   Culture   Final    NO GROWTH 4 DAYS Performed at [facility], [address], [place], [place] [address]    Report Status PENDING  Incomplete  CULTURE, BLOOD (ROUTINE X 2) w Reflex to ID Panel     Status: None (Preliminary result)   Collection Time: 01/20/21 10:20 AM   Specimen: BLOOD RIGHT HAND  Result Value Ref Range Status   Specimen Description BLOOD RIGHT HAND  Final   Special Requests   Final    BOTTLES DRAWN AEROBIC AND ANAEROBIC Blood Culture adequate volume   Culture   Final    NO GROWTH 4 DAYS Performed at [facility], [address], [place], [place] [address]    Report Status PENDING  Incomplete         Radiology Studies: DG Chest Port 1 View  Result Date: 01/22/2021 CLINICAL DATA:  Hepatitis, cirrhosis, hypoxia EXAM: PORTABLE CHEST 1 VIEW COMPARISON:  01/20/2021 FINDINGS: Single frontal view of the chest demonstrates an enlarged cardiac silhouette. There is progressive central vascular congestion with worsening interstitial and ground-glass opacities throughout the lungs. Small  bilateral pleural effusions are now  seen. No pneumothorax. IMPRESSION: 1. Findings consistent with worsening volume status and progressive pulmonary edema. Electronically Signed   By: Sharlet Salina M.D.   On: 01/22/2021 18:14        Scheduled Meds:  folic acid  1 mg Oral Daily   lactulose  10 g Oral BID   lactulose  300 mL Rectal BID   multivitamin with minerals  1 tablet Oral Daily   pantoprazole  40 mg Oral Q1200   Continuous Infusions:  sodium chloride 10 mL/hr at 01/23/21 0511   cefTRIAXone (ROCEPHIN)  IV 2 g (01/24/21 0926)   thiamine injection 500 mg (01/24/21 0532)     LOS: 4 days    Time spent: 25 minutes    Tresa Moore, MD [facility]   If 7PM-7AM, please contact night-coverage  01/24/2021, 9:38 AM

## 2021-01-24 NOTE — Progress Notes (Signed)
Patient was very agitated upon assessment, husband at bedside. CIWA score of 24, IM geodon 1 mg given. MD ordered ICU transfer. Will continue to monitor and assess.

## 2021-01-24 NOTE — Consult Note (Signed)
CRITICAL CARE PROGRESS NOTE    Name: [name] Carrillo MRN: [id number] DOB: [birth year]     LOS: 4  Referring physician: Dr Priscella Mann  SUBJECTIVE FINDINGS & SIGNIFICANT EVENTS    Patient description:  55 yo F wthi hx of MDD, Anxiety disorder, osteomyelitis of left tibia, left tibial fracture history, recurrent UTIs, EtOH abuse and liver Cirrhosis ([place]), active alcoholism, tobacco abuse with multiple readmissions to hospital with EtOH withdrawal syndrome and now being brought into MICU due to unstable vital signs and aggitated hyperactive delerium with concerns for delerium tremens. Blood work is notable for transaminitis with decreased synthetic function.    Patient had ziplock bag with two teeth in there due to loose teeth in mouth. Husband shares that patient has few false teeth that she had created herself.   Ive met with husband and son and explained that patient is unresponsive with GCS7 and is not able to protect airway. They understand the severity of situation and agree for intubation if needed. We discussed goals of care and patient is full code.   Lines/tubes :   Microbiology/Sepsis markers: Results for orders placed or performed during the hospital encounter of 01/20/21  Urine Culture     Status: Abnormal   Collection Time: 01/19/21 11:40 PM   Specimen: Urine, Clean Catch  Result Value Ref Range Status   Specimen Description   Final    URINE, CLEAN CATCH Performed at [facility], [address] [place], [place] [address]    Special Requests   Final    NONE Performed at [facility], [address], [place], [place] [address]    Culture (A)  Final    80,000 COLONIES/mL KLEBSIELLA PNEUMONIAE 70,000 COLONIES/mL ESCHERICHIA COLI    Report Status 01/24/2021 FINAL  Final   Organism  ID, Bacteria KLEBSIELLA PNEUMONIAE (A)  Final   Organism ID, Bacteria ESCHERICHIA COLI (A)  Final      Susceptibility   Escherichia coli - MIC*    AMPICILLIN <=2 SENSITIVE Sensitive     CEFAZOLIN <=4 SENSITIVE Sensitive     CEFEPIME <=0.12 SENSITIVE Sensitive     CEFTRIAXONE <=0.25 SENSITIVE Sensitive     CIPROFLOXACIN <=0.25 SENSITIVE Sensitive     GENTAMICIN <=1 SENSITIVE Sensitive     IMIPENEM <=0.25 SENSITIVE Sensitive     NITROFURANTOIN <=16 SENSITIVE Sensitive     TRIMETH/SULFA <=20 SENSITIVE Sensitive     AMPICILLIN/SULBACTAM <=2 SENSITIVE Sensitive     PIP/TAZO <=4 SENSITIVE Sensitive     * 70,000 COLONIES/mL ESCHERICHIA COLI   Klebsiella pneumoniae - MIC*    AMPICILLIN RESISTANT Resistant     CEFAZOLIN <=4 SENSITIVE Sensitive     CEFEPIME <=0.12 SENSITIVE Sensitive     CEFTRIAXONE <=0.25 SENSITIVE Sensitive     CIPROFLOXACIN <=0.25 SENSITIVE Sensitive     GENTAMICIN <=1 SENSITIVE Sensitive     IMIPENEM <=0.25 SENSITIVE Sensitive     NITROFURANTOIN <=16 SENSITIVE Sensitive     TRIMETH/SULFA <=20 SENSITIVE Sensitive     AMPICILLIN/SULBACTAM 4 SENSITIVE Sensitive     PIP/TAZO <=4 SENSITIVE Sensitive     * 80,000 COLONIES/mL KLEBSIELLA PNEUMONIAE  Resp Panel by RT-PCR (Flu A&B, Covid) Nasopharyngeal Swab     Status: None   Collection Time: 01/20/21  1:43 AM   Specimen: Nasopharyngeal Swab; Nasopharyngeal(NP) swabs in vial transport medium  Result Value Ref Range Status   SARS Coronavirus 2 by RT PCR NEGATIVE NEGATIVE Final    Comment: (NOTE) SARS-CoV-2 target nucleic acids are NOT  DETECTED.  The SARS-CoV-2 RNA is generally detectable in upper respiratory specimens during the acute phase of infection. The lowest concentration of SARS-CoV-2 viral copies this assay can detect is 138 copies/mL. A negative result does not preclude SARS-Cov-2 infection and should not be used as the sole basis for treatment or other patient management decisions. A negative result may occur  with  improper specimen collection/handling, submission of specimen other than nasopharyngeal swab, presence of viral mutation(s) within the areas targeted by this assay, and inadequate number of viral copies(<138 copies/mL). A negative result must be combined with clinical observations, patient history, and epidemiological information. The expected result is Negative.  Fact Sheet for Patients:  EntrepreneurPulse.com.au  Fact Sheet for Healthcare Providers:  IncredibleEmployment.be  This test is no t yet approved or cleared by the [place] FDA and  has been authorized for detection and/or diagnosis of SARS-CoV-2 by FDA under an Emergency Use Authorization (EUA). This EUA will remain  in effect (meaning this test can be used) for the duration of the COVID-19 declaration under Section 564(b)(1) of the Act, 21 U.S.C.section 360bbb-3(b)(1), unless the authorization is terminated  or revoked sooner.       Influenza A by PCR NEGATIVE NEGATIVE Final   Influenza B by PCR NEGATIVE NEGATIVE Final    Comment: (NOTE) The Xpert Xpress SARS-CoV-2/FLU/RSV plus assay is intended as an aid in the diagnosis of influenza from Nasopharyngeal swab specimens and should not be used as a sole basis for treatment. Nasal washings and aspirates are unacceptable for Xpert Xpress SARS-CoV-2/FLU/RSV testing.  Fact Sheet for Patients: EntrepreneurPulse.com.au  Fact Sheet for Healthcare Providers: IncredibleEmployment.be  This test is not yet approved or cleared by the [place] FDA and has been authorized for detection and/or diagnosis of SARS-CoV-2 by FDA under an Emergency Use Authorization (EUA). This EUA will remain in effect (meaning this test can be used) for the duration of the COVID-19 declaration under Section 564(b)(1) of the Act, 21 U.S.C. section 360bbb-3(b)(1), unless the authorization is terminated  or revoked.  Performed at [facility], [address] [place], [place] [address]   CULTURE, BLOOD (ROUTINE X 2) w Reflex to ID Panel     Status: None (Preliminary result)   Collection Time: 01/20/21  9:55 AM   Specimen: BLOOD  Result Value Ref Range Status   Specimen Description BLOOD RIGHT ANTECUBITAL  Final   Special Requests   Final    BOTTLES DRAWN AEROBIC ONLY Blood Culture adequate volume   Culture   Final    NO GROWTH 4 DAYS Performed at [facility], [address] [place], [place] [address]    Report Status PENDING  Incomplete  CULTURE, BLOOD (ROUTINE X 2) w Reflex to ID Panel     Status: None (Preliminary result)   Collection Time: 01/20/21 10:20 AM   Specimen: BLOOD RIGHT HAND  Result Value Ref Range Status   Specimen Description BLOOD RIGHT HAND  Final   Special Requests   Final    BOTTLES DRAWN AEROBIC AND ANAEROBIC Blood Culture adequate volume   Culture   Final    NO GROWTH 4 DAYS Performed at [facility] [address] [place], [place] [address]    Report Status PENDING  Incomplete    Anti-infectives:  Anti-infectives (From admission, onward)    Start     Dose/Rate Route Frequency Ordered Stop   01/24/21 1500  Ampicillin-Sulbactam (UNASYN) 3 g in sodium chloride 0.9 % 100 mL IVPB  3 g 200 mL/hr over 30 Minutes Intravenous Every 6 hours 01/24/21 1343     01/24/21 1000  cefTRIAXone (ROCEPHIN) 2 g in sodium chloride 0.9 % 100 mL IVPB  Status:  Discontinued        2 g 200 mL/hr over 30 Minutes Intravenous Every 24 hours 01/24/21 0748 01/24/21 1327   01/20/21 0800  cefTRIAXone (ROCEPHIN) 1 g in sodium chloride 0.9 % 100 mL IVPB        1 g 200 mL/hr over 30 Minutes Intravenous Every 24 hours 01/20/21 0724 01/22/21 1804        Consults: Treatment Team:  Efrain Sella, MD Ottie Glazier, MD      PAST MEDICAL HISTORY   Past Medical History:  Diagnosis Date   Anxiety    Depression      SURGICAL  HISTORY   Past Surgical History:  Procedure Laterality Date   APPLICATION OF WOUND VAC Left 03/08/2017   Procedure: APPLICATION OF WOUND VAC;  Surgeon: Leim Fabry, MD;  Location: [facility];  Service: Orthopedics;  Laterality: Left;   DEBRIDEMENT AND CLOSURE WOUND Left 04/03/2017   Procedure: DEBRIDEMENT AND CLOSURE WOUND;  Surgeon: Wallace Going, DO;  Location: [facility];  Service: Plastics;  Laterality: Left;   HARDWARE REMOVAL Left 03/08/2017   Procedure: HARDWARE REMOVAL-LEFT LEG AND ANKLE;  Surgeon: Leim Fabry, MD;  Location: [facility];  Service: Orthopedics;  Laterality: Left;   right leg surgery Right 2014   .IM nailing right leg. screws and rod inserted.   TIBIA IM NAIL INSERTION Left 02/12/2017   Procedure: INTRAMEDULLARY (IM) NAIL TIBIAL;  Surgeon: Leim Fabry, MD;  Location: [facility];  Service: Orthopedics;  Laterality: Left;   TUBAL LIGATION  11/2002     FAMILY HISTORY   History reviewed. No pertinent family history.   SOCIAL HISTORY   Social History   Tobacco Use   Smoking status: Former    Packs/day: 0.50    Types: Cigarettes    Quit date: 05/04/2016    Years since quitting: 4.7   Smokeless tobacco: Never  Vaping Use   Vaping Use: Never used  Substance Use Topics   Alcohol use: Yes   Drug use: No     MEDICATIONS   Current Medication:  Current Facility-Administered Medications:    0.9 %  sodium chloride infusion, , Intravenous, PRN, Ralene Muskrat B, MD, Last Rate: 10 mL/hr at 01/23/21 0511, New Bag at 01/23/21 0511   albuterol (PROVENTIL) (2.5 MG/3ML) 0.083% nebulizer solution 2.5 mg, 2.5 mg, Nebulization, Q4H PRN, Ivor Costa, MD   Ampicillin-Sulbactam (UNASYN) 3 g in sodium chloride 0.9 % 100 mL IVPB, 3 g, Intravenous, Q6H, Deal, Justice Britain, RPH   dexmedetomidine (PRECEDEX) 200 MCG/50ML (4 mcg/mL) infusion, 0.2-0.7 mcg/kg/hr, Intravenous, Continuous, Sreenath, Sudheer B, MD   dextromethorphan-guaiFENesin (MUCINEX DM) 30-600 MG  per 12 hr tablet 1 tablet, 1 tablet, Oral, BID PRN, Ivor Costa, MD   folic acid (FOLVITE) tablet 1 mg, 1 mg, Oral, Daily, Ivor Costa, MD, 1 mg at 01/24/21 0912   lactulose (CHRONULAC) 10 GM/15ML solution 10 g, 10 g, Oral, BID, Sreenath, Sudheer B, MD, 10 g at 01/24/21 0913   lactulose (CHRONULAC) enema 200 gm, 300 mL, Rectal, BID, Sreenath, Sudheer B, MD, 300 mL at 01/23/21 2217   LORazepam (ATIVAN) tablet 1-4 mg, 1-4 mg, Oral, Q1H PRN **OR** LORazepam (ATIVAN) injection 1-4 mg, 1-4 mg, Intravenous, Q1H PRN, Priscella Mann, Sudheer B, MD, 4 mg at 01/24/21 1055   multivitamin  with minerals tablet 1 tablet, 1 tablet, Oral, Daily, Ivor Costa, MD, 1 tablet at 01/24/21 0912   ondansetron (ZOFRAN) injection 4 mg, 4 mg, Intravenous, Q8H PRN, Ivor Costa, MD   oxyCODONE (Oxy IR/ROXICODONE) immediate release tablet 5 mg, 5 mg, Oral, Q4H PRN, Ivor Costa, MD   pantoprazole (PROTONIX) EC tablet 40 mg, 40 mg, Oral, Q1200, Ivor Costa, MD, 40 mg at 01/24/21 7564    ALLERGIES   Patient has no known allergies.    REVIEW OF SYSTEMS     Patient is unresponsive to verbal communication  PHYSICAL EXAMINATION   Vital Signs: Temp:  [97.8 F (36.6 C)-98.9 F (37.2 C)] 97.8 F (36.6 C) (10/23 0828) Pulse Rate:  [84-95] 94 (10/23 1051) Resp:  [16-24] 20 (10/23 1051) BP: (93-120)/(50-76) 120/62 (10/23 1051) SpO2:  [97 %-100 %] 100 % (10/23 1051)  GENERAL:No apparent distress age appropriate HEAD: Normocephalic, atraumatic.  EYES: Pupils equal, round, reactive to light.  No scleral icterus.  MOUTH: Moist mucosal membrane. NECK: Supple. No thyromegaly. No nodules. No JVD.  PULMONARY: mild rhonchi bilaterally  CARDIOVASCULAR: S1 and S2. Regular rate and rhythm. No murmurs, rubs, or gallops.  GASTROINTESTINAL: Soft, nontender, non-distended. No masses. Positive bowel sounds. No hepatosplenomegaly.  MUSCULOSKELETAL: No swelling, clubbing, or edema.  NEUROLOGIC: gCS7 SKIN:intact,warm,dry   PERTINENT DATA      Infusions:  sodium chloride 10 mL/hr at 01/23/21 0511   ampicillin-sulbactam (UNASYN) IV     dexmedetomidine (PRECEDEX) IV infusion     Scheduled Medications:  folic acid  1 mg Oral Daily   lactulose  10 g Oral BID   lactulose  300 mL Rectal BID   multivitamin with minerals  1 tablet Oral Daily   pantoprazole  40 mg Oral Q1200   PRN Medications: sodium chloride, albuterol, dextromethorphan-guaiFENesin, LORazepam **OR** LORazepam, ondansetron (ZOFRAN) IV, oxyCODONE Hemodynamic parameters:   Intake/Output: No intake/output data recorded.  Ventilator  Settings:       LAB RESULTS:  Basic Metabolic Panel: Recent Labs  Lab 01/20/21 0955 01/20/21 1526 01/20/21 2316 01/21/21 0722 01/22/21 0758 01/23/21 0414 01/24/21 0522  NA 129*   < > 130* 131* 137 139 143  K 3.4*   < > 3.1* 3.3* 3.6 3.4* 3.2*  CL 88*   < > 90* 94* 100 101 105  CO2 30   < > '29 29 29 30 30  ' GLUCOSE 114*   < > 109* 111* 117* 114* 121*  BUN 21*   < > '18 17 14 13 14  ' CREATININE 0.58   < > 0.54 0.54 0.51 0.61 0.47  CALCIUM 9.1   < > 8.8* 8.6* 8.8* 8.6* 8.6*  MG 1.7  --   --   --   --   --   --   PHOS 2.7  --   --   --   --   --   --    < > = values in this interval not displayed.   Liver Function Tests: Recent Labs  Lab 01/19/21 1341 01/22/21 0758 01/23/21 0414 01/24/21 0522  AST 371* 291*  291* 265* 225*  ALT 91* 79*  81* 74* 67*  ALKPHOS 195* 150*  160* 155* 138*  BILITOT 16.0* 9.6*  9.6* 9.7* 9.6*  PROT 7.6 6.1*  6.2* 6.4* 6.0*  ALBUMIN 2.6* 2.1*  2.2* 2.1* 2.1*   Recent Labs  Lab 01/19/21 1341  LIPASE 54*   Recent Labs  Lab 01/19/21 1341 01/24/21 0819  AMMONIA 35 70*  CBC: Recent Labs  Lab 01/19/21 1341 01/22/21 0758 01/23/21 0414 01/24/21 0522  WBC 12.9* 14.1* 14.9* 16.6*  NEUTROABS  --  10.8* 11.4* 13.3*  HGB 8.4* 7.0* 7.4* 7.0*  HCT 23.9* 21.1* 22.6* 21.8*  MCV 127.1* 131.9* 133.7* 134.6*  PLT 150 141* 148* 143*   Cardiac Enzymes: No results for  input(s): CKTOTAL, CKMB, CKMBINDEX, TROPONINI in the last 168 hours. BNP: Invalid input(s): POCBNP CBG: Recent Labs  Lab 01/23/21 0734 01/23/21 1113 01/23/21 1649 01/24/21 0826  GLUCAP 107* 118* 116* 117*       IMAGING RESULTS:  Imaging: DG Chest Port 1 View  Result Date: 01/24/2021 CLINICAL DATA:  Hypoxia.  Follow-up pulmonary edema. EXAM: PORTABLE CHEST 1 VIEW COMPARISON:  01/22/2021 FINDINGS: The heart is enlarged. There is pulmonary venous hypertension with interstitial and alveolar edema, worsened since 2 days ago. No measurable pleural effusion visible in the frontal projection. No acute bone finding. IMPRESSION: Congestive heart failure pattern with worsened edema. Electronically Signed   By: Nelson Chimes M.D.   On: 01/24/2021 15:12   DG Chest Port 1 View  Result Date: 01/22/2021 CLINICAL DATA:  Hepatitis, cirrhosis, hypoxia EXAM: PORTABLE CHEST 1 VIEW COMPARISON:  01/20/2021 FINDINGS: Single frontal view of the chest demonstrates an enlarged cardiac silhouette. There is progressive central vascular congestion with worsening interstitial and ground-glass opacities throughout the lungs. Small bilateral pleural effusions are now seen. No pneumothorax. IMPRESSION: 1. Findings consistent with worsening volume status and progressive pulmonary edema. Electronically Signed   By: Randa Ngo M.D.   On: 01/22/2021 18:14   '@PROBHOSP' @ DG Chest Port 1 View  Result Date: 01/24/2021 CLINICAL DATA:  Hypoxia.  Follow-up pulmonary edema. EXAM: PORTABLE CHEST 1 VIEW COMPARISON:  01/22/2021 FINDINGS: The heart is enlarged. There is pulmonary venous hypertension with interstitial and alveolar edema, worsened since 2 days ago. No measurable pleural effusion visible in the frontal projection. No acute bone finding. IMPRESSION: Congestive heart failure pattern with worsened edema. Electronically Signed   By: Nelson Chimes M.D.   On: 01/24/2021 15:12     ASSESSMENT AND PLAN     -Multidisciplinary rounds held today  Acutely unresponsive status with encephalopahty -suspect in context of Delerium tremens -alcoholic hepatitis -multiple electrolyte derrangements -treat underlying cause -high risk of requiring intubation and placement on MV    Delerium tremens   - high risk for intubation    CIWA  - [place] Alcohol withdrawal protocol  -Folate and thiamine repletion   Acute alcoholic hepatitis  Patient with encephalopathy and moderate transaminitis with AST/ALT 2>1 along with abdominal pain - Daddrey discriminant function >32 will start dexamethasone 4 bid  -thiamine and folate repleation  -dc hepatotoxic medicaitons ICU monitoring  Severe macrocytic anemia -will transfuse 2 units prbc - there is concomitant coagulopathy due to liver dysfunction  - no grossly appreciated bleeding   Coagulopathy and thromobocytopenia - high risk of bleeding would encourage SCDs and avoid anticoagulation  Severe protein calorie malnutrition  -RD nutritional evaluation  -low albumin and bitemporal muscle wasting - high risk for refeeding syndrome and eletrolyte derrangements   ID -continue IV abx as prescibed -follow up cultures  GI/Nutrition GI PROPHYLAXIS as indicated DIET-->TF's as tolerated Constipation protocol as indicated  ENDO - ICU hypoglycemic\Hyperglycemia protocol -check FSBS per protocol   ELECTROLYTES -follow labs as needed -replace as needed -pharmacy consultation   DVT/GI PRX ordered -SCDs  TRANSFUSIONS AS NEEDED MONITOR FSBS ASSESS the need for LABS as needed   Critical care provider statement:  Total critical care time: 33 minutes   Performed by: Lanney Gins MD   Critical care time was exclusive of separately billable procedures and treating other patients.   Critical care was necessary to treat or prevent imminent or life-threatening deterioration.   Critical care was time spent personally by me on the following  activities: development of treatment plan with patient and/or surrogate as well as nursing, discussions with consultants, evaluation of patient's response to treatment, examination of patient, obtaining history from patient or surrogate, ordering and performing treatments and interventions, ordering and review of laboratory studies, ordering and review of radiographic studies, pulse oximetry and re-evaluation of patient's condition.    Ottie Glazier, M.D.  Pulmonary & Critical Care Medicine

## 2021-01-25 DIAGNOSIS — G9341 Metabolic encephalopathy: Secondary | ICD-10-CM | POA: Diagnosis not present

## 2021-01-25 LAB — TYPE AND SCREEN
ABO/RH(D): O POS
Antibody Screen: NEGATIVE
Unit division: 0
Unit division: 0

## 2021-01-25 LAB — CBC WITH DIFFERENTIAL/PLATELET
Abs Immature Granulocytes: 0.1 10*3/uL — ABNORMAL HIGH (ref 0.00–0.07)
Basophils Absolute: 0.1 10*3/uL (ref 0.0–0.1)
Basophils Relative: 1 %
Eosinophils Absolute: 0 10*3/uL (ref 0.0–0.5)
Eosinophils Relative: 0 %
HCT: 27.8 % — ABNORMAL LOW (ref 36.0–46.0)
Hemoglobin: 9.3 g/dL — ABNORMAL LOW (ref 12.0–15.0)
Immature Granulocytes: 1 %
Lymphocytes Relative: 5 %
Lymphs Abs: 0.7 10*3/uL (ref 0.7–4.0)
MCH: 39.4 pg — ABNORMAL HIGH (ref 26.0–34.0)
MCHC: 33.5 g/dL (ref 30.0–36.0)
MCV: 117.8 fL — ABNORMAL HIGH (ref 80.0–100.0)
Monocytes Absolute: 0.3 10*3/uL (ref 0.1–1.0)
Monocytes Relative: 2 %
Neutro Abs: 12.9 10*3/uL — ABNORMAL HIGH (ref 1.7–7.7)
Neutrophils Relative %: 91 %
Platelets: 122 10*3/uL — ABNORMAL LOW (ref 150–400)
RBC: 2.36 MIL/uL — ABNORMAL LOW (ref 3.87–5.11)
WBC: 14.2 10*3/uL — ABNORMAL HIGH (ref 4.0–10.5)
nRBC: 0 % (ref 0.0–0.2)

## 2021-01-25 LAB — COMPREHENSIVE METABOLIC PANEL
ALT: 61 U/L — ABNORMAL HIGH (ref 0–44)
AST: 194 U/L — ABNORMAL HIGH (ref 15–41)
Albumin: 3 g/dL — ABNORMAL LOW (ref 3.5–5.0)
Alkaline Phosphatase: 130 U/L — ABNORMAL HIGH (ref 38–126)
Anion gap: 10 (ref 5–15)
BUN: 17 mg/dL (ref 6–20)
CO2: 29 mmol/L (ref 22–32)
Calcium: 8.8 mg/dL — ABNORMAL LOW (ref 8.9–10.3)
Chloride: 105 mmol/L (ref 98–111)
Creatinine, Ser: 0.79 mg/dL (ref 0.44–1.00)
GFR, Estimated: >60 mL/min (ref >60)
Glucose, Bld: 182 mg/dL — ABNORMAL HIGH (ref 70–99)
Potassium: 3.7 mmol/L (ref 3.5–5.1)
Sodium: 144 mmol/L (ref 135–145)
Total Bilirubin: 9.5 mg/dL — ABNORMAL HIGH (ref 0.3–1.2)
Total Protein: 7.2 g/dL (ref 6.5–8.1)

## 2021-01-25 LAB — CULTURE, BLOOD (ROUTINE X 2)
Culture: NO GROWTH
Culture: NO GROWTH
Special Requests: ADEQUATE
Special Requests: ADEQUATE

## 2021-01-25 LAB — GLUCOSE, CAPILLARY
Glucose-Capillary: 156 mg/dL — ABNORMAL HIGH (ref 70–99)
Glucose-Capillary: 157 mg/dL — ABNORMAL HIGH (ref 70–99)
Glucose-Capillary: 165 mg/dL — ABNORMAL HIGH (ref 70–99)
Glucose-Capillary: 170 mg/dL — ABNORMAL HIGH (ref 70–99)
Glucose-Capillary: 178 mg/dL — ABNORMAL HIGH (ref 70–99)
Glucose-Capillary: 180 mg/dL — ABNORMAL HIGH (ref 70–99)
Glucose-Capillary: 183 mg/dL — ABNORMAL HIGH (ref 70–99)

## 2021-01-25 LAB — BPAM RBC
Blood Product Expiration Date: 202211202359
Blood Product Expiration Date: 202211232359
ISSUE DATE / TIME: 202210232030
ISSUE DATE / TIME: 202210232354
Unit Type and Rh: 5100
Unit Type and Rh: 5100

## 2021-01-25 LAB — BLOOD GAS, ARTERIAL
Acid-Base Excess: 5.6 mmol/L — ABNORMAL HIGH (ref 0.0–2.0)
Bicarbonate: 29.8 mmol/L — ABNORMAL HIGH (ref 20.0–28.0)
FIO2: 0.45
MECHVT: 400 mL
O2 Saturation: 96.2 %
PEEP: 5 cmH2O
Patient temperature: 37
RATE: 16 resp/min
pCO2 arterial: 41 mmHg (ref 32.0–48.0)
pH, Arterial: 7.47 — ABNORMAL HIGH (ref 7.350–7.450)
pO2, Arterial: 78 mmHg — ABNORMAL LOW (ref 83.0–108.0)

## 2021-01-25 LAB — PROCALCITONIN: Procalcitonin: 0.45 ng/mL

## 2021-01-25 LAB — PROTIME-INR
INR: 1.5 — ABNORMAL HIGH (ref 0.8–1.2)
Prothrombin Time: 18.3 seconds — ABNORMAL HIGH (ref 11.4–15.2)

## 2021-01-25 LAB — MAGNESIUM: Magnesium: 2.6 mg/dL — ABNORMAL HIGH (ref 1.7–2.4)

## 2021-01-25 LAB — PHOSPHORUS: Phosphorus: 3.9 mg/dL (ref 2.5–4.6)

## 2021-01-25 MED ORDER — VITAL AF 1.2 CAL PO LIQD
1000.0000 mL | ORAL | Status: DC
  Administered 2021-01-25 – 2021-02-02 (×9): 1000 mL

## 2021-01-25 MED ORDER — MIDAZOLAM HCL 2 MG/2ML IJ SOLN
2.0000 mg | INTRAMUSCULAR | Status: DC | PRN
  Administered 2021-01-25 – 2021-01-29 (×8): 2 mg via INTRAVENOUS
  Filled 2021-01-25 (×8): qty 2

## 2021-01-25 MED ORDER — LACTULOSE 10 GM/15ML PO SOLN
30.0000 g | Freq: Three times a day (TID) | ORAL | Status: DC
  Administered 2021-01-25 – 2021-01-26 (×4): 30 g
  Filled 2021-01-25 (×4): qty 60

## 2021-01-25 MED ORDER — PROSOURCE TF PO LIQD
45.0000 mL | Freq: Every day | ORAL | Status: DC
  Administered 2021-01-26 – 2021-02-13 (×18): 45 mL
  Filled 2021-01-25 (×19): qty 45

## 2021-01-25 MED ORDER — PANTOPRAZOLE 2 MG/ML SUSPENSION
40.0000 mg | Freq: Every day | ORAL | Status: DC
  Administered 2021-01-25 – 2021-02-12 (×19): 40 mg
  Filled 2021-01-25 (×18): qty 20

## 2021-01-25 MED ORDER — FREE WATER
30.0000 mL | Status: DC
  Administered 2021-01-25 – 2021-01-26 (×5): 30 mL

## 2021-01-25 MED ORDER — DIAZEPAM 5 MG PO TABS
2.5000 mg | ORAL_TABLET | Freq: Four times a day (QID) | ORAL | Status: DC
  Administered 2021-01-25 – 2021-01-29 (×16): 2.5 mg
  Filled 2021-01-25 (×16): qty 1

## 2021-01-25 NOTE — Progress Notes (Signed)
Patient transferred to intensive care unit yesterday for persistent encephalopathy and severe withdrawals concern for delirium tremens.  Discussed case at length with PCCM attending.  Upon arrival patient was lethargic and difficult to arouse.  Concern regarding protection of airway.  Arterial blood gas demonstrates hypoxic respiratory failure.  After evaluation decision was made to intubate patient for airway protection.  Primary care of this patient has been transferred to [facility] service.    [facility] hospitalist service to sign off at this time.  Greatly appreciate swift PCCM assistance.  Please contact our service when patient stable for transfer to general medical floor.  [name] Patella MD

## 2021-01-25 NOTE — Progress Notes (Signed)
Initial Nutrition Assessment  DOCUMENTATION CODES:   Not applicable  INTERVENTION:   Vital 1.2 @55ml /hr- Initiate at 50ml/hr and increase by 3ml/hr q 8 hours until goal rate is reached.   Pro-Source 67ml daily via tube, provides 40kcal and 11g of protein per serving   Free water flushes 27ml q4 hours to maintain tube patency   Regimen provides 1624kcal/day, 110g/day protein and 1246ml/day free water   Pt at high refeed risk; recommend monitor potassium, magnesium and phosphorus labs daily until stable  NUTRITION DIAGNOSIS:   Inadequate oral intake related to inability to eat (pt sedated and ventilated) as evidenced by NPO status.  GOAL:   Provide needs based on ASPEN/SCCM guidelines  MONITOR:   Vent status, Labs, Weight trends, Skin, I & O's, TF tolerance  REASON FOR ASSESSMENT:   Consult, Ventilator Assessment of nutrition requirement/status  ASSESSMENT:   55 y/o female with h/o etoh abuse, anxiety, depression and cirrhosis who is admitted with encephalopathy.  Pt sedated and ventilated. Pt agitated today. OGT in place to LIS with 59 output. Plan is to start tube feeds today. Pt is at high refeed risk. Husband at bedside reports pt with poor oral intake for several months pta. RD suspects pt with poor oral intake at baseline r/t etoh abuse. There is no documented weight history in chart to determine if any significant recent weight changes but husband reports that pt has been losing weight recently.  Medications reviewed and include: dexamethasone, colace, folic acid, lactulose, MVI, protonix, miralax, thiamine, albumin, ceftriaxone, precedex  Labs reviewed: K 3.7 wnl, P 3.9 wnl, Mg 2.6(H), tbili 9.5(H) Wbc- 14.2(H), Hgb 9.3(L), Hct 27.8(L) Cbgs- 178, 165, 170, 157 x 24 hrs  Patient is currently intubated on ventilator support MV: 7.5 L/min Temp (24hrs), Avg:97.6 F (36.4 C), Min:97.4 F (36.3 C), Max:98.9 F (37.2 C)  Propofol: none   MAP- >50mmHg  UOP-  77m   Nutrition Focused Physical Exam:  Flowsheet Row Most Recent Value  Orbital Region No depletion  Upper Arm Region Moderate depletion  Thoracic and Lumbar Region No depletion  Buccal Region No depletion  Temple Region Moderate depletion  Clavicle Bone Region No depletion  Clavicle and Acromion Bone Region No depletion  Scapular Bone Region No depletion  Dorsal Hand Mild depletion  Patellar Region Severe depletion  Anterior Thigh Region Moderate depletion  Posterior Calf Region Severe depletion  Edema (RD Assessment) None  Hair Reviewed  Eyes Reviewed  Mouth Reviewed  Skin Reviewed  Nails Reviewed   Diet Order:   Diet Order             Diet NPO time specified  Diet effective now                  EDUCATION NEEDS:   No education needs have been identified at this time  Skin:  Skin Assessment: Reviewed RN Assessment (ecchymosis)  Last BM:  10/22- TYPE 6  Height:   Ht Readings from Last 1 Encounters:  01/19/21 5\' 5"  (1.651 m)    Weight:   Wt Readings from Last 1 Encounters:  01/24/21 79.4 kg    Ideal Body Weight:  56.8 kg  BMI:  Body mass index is 29.13 kg/m.  Estimated Nutritional Needs:   Kcal:  1566kcal/day  Protein:  100-120g/day  Fluid:  1.7-2.0L/day  MS, RD, LDN Please refer to [facility] for RD and/or RD on-call/weekend/after hours pager

## 2021-01-25 NOTE — Progress Notes (Signed)
NAME:  [name] Carrillo, MRN:  [id number], DOB:  [birth year], LOS: 5 ADMISSION DATE:  01/20/2021, CONSULTATION DATE:  01/24/21 REFERRING MD:  Priscella Mann, CHIEF COMPLAINT:  unresponsive   History of Present Illness:  55 yo F wthi hx of MDD, Anxiety disorder, osteomyelitis of left tibia, left tibial fracture history, recurrent UTIs, EtOH abuse and liver Cirrhosis ([place]), active alcoholism, tobacco abuse with multiple readmissions to hospital with EtOH withdrawal syndrome and now being brought into MICU due to unstable vital signs and aggitated hyperactive delerium with concerns for delerium tremens. Blood work is notable for transaminitis with decreased synthetic function.     Patient had ziplock bag with two teeth in there due to loose teeth in mouth. Husband shares that patient has few false teeth that she had created herself.    Ive met with husband and son and explained that patient is unresponsive with GCS7 and is not able to protect airway. They understand the severity of situation and agree for intubation if needed. We discussed goals of care and patient is full code.   Pertinent  Medical History  MDD Anxiety Alcohol use disorder Alcoholic cirrhosis Smoking  Significant Hospital Events: Including procedures, antibiotic start and stop dates in addition to other pertinent events   10/24 transfer to ICU, intubation, CVL placed  Interim History / Subjective:  Intermittent agitation despite precedex, fentanyl drips.   Objective   Blood pressure (!) 93/54, pulse (!) 56, temperature 98.9 F (37.2 C), temperature source Oral, resp. rate 17, height '5\' 5"'  (1.651 m), weight 79.4 kg, last menstrual period 09/07/2015, SpO2 96 %.    Vent Mode: PRVC FiO2 (%):  [40 %-45 %] 40 % Set Rate:  [16 bmp] 16 bmp Vt Set:  [400 mL-450 mL] 400 mL PEEP:  [5 cmH20] 5 cmH20 Plateau Pressure:  [15 cmH20-18 cmH20] 15 cmH20   Intake/Output Summary (Last 24 hours) at 01/25/2021 1243 Last data filed at 01/25/2021  1133 Gross per 24 hour  Intake 1558.2 ml  Output 995 ml  Net 563.2 ml   Filed Weights   01/19/21 1338 01/24/21 1549  Weight: 81.6 kg 79.4 kg    Examination: General appearance: 55 y.o., female, deeply sedated  Eyes: icteric sclerae, pinpoint pupils Lungs: mech breath sounds bl, equal chest rise CV: RRR, no MRGs  Abdomen: Soft, non-tender; non-distended, hypoactive BS Extremities: trace peripheral edema, warm Neuro: does not waken to loud voice  Appropriate response to transfusion of 2U pRBC UCx 10/18 with E coli, Klebsiella  Resolved Hospital Problem list       Assessment & Plan:   Acute toxic metabolic encephalopathy Moderate-severe alcohol withdrawal Hyperammonemia - will consider addition of scheduled benzodiazepine given issues with intermittent agitation as precedex weaned - precedex for rass 0 to -1 as below - lactulose for stool output 1L  UTI - ceftriaxone 5-10 day course depending on response  Acute respiratory failure Intubated for airway protection in setting of encephalopathy - full vent support - SAT/SBT  Alcoholic hepatitis DF seems to be below threshold for consideration of steroids and long term benefit seems unlikely. Hepatitis panel neg on admission. - trend LFTs - check US doppler liver  Severe protein calorie malnutrition - watch for refeeding - thiamine - RD eval  Macrocytic anemia Thrombocytopenia Likely driven by liver disease/alcoholism - trend CBC    Best Practice (right click and "Reselect all SmartList Selections" daily)   Diet/type: tubefeeds DVT prophylaxis: SCD GI prophylaxis: PPI Lines: Central line Foley:  N/A Code Status:  full code Last date of multidisciplinary goals of care discussion Aletha Halim update family later this afternoon]  Critical care time: 34 minutes

## 2021-01-26 ENCOUNTER — Inpatient Hospital Stay (HOSPITAL_COMMUNITY)
Admit: 2021-01-26 | Discharge: 2021-01-26 | Disposition: A | Discharge status: Home / Self Care | Payer: BC Managed Care – PPO | Attending: Internal Medicine | Admitting: Internal Medicine

## 2021-01-26 DIAGNOSIS — N3000 Acute cystitis without hematuria: Secondary | ICD-10-CM

## 2021-01-26 DIAGNOSIS — I5031 Acute diastolic (congestive) heart failure: Secondary | ICD-10-CM

## 2021-01-26 DIAGNOSIS — G9341 Metabolic encephalopathy: Secondary | ICD-10-CM | POA: Diagnosis not present

## 2021-01-26 DIAGNOSIS — K7682 Hepatic encephalopathy: Secondary | ICD-10-CM | POA: Diagnosis not present

## 2021-01-26 DIAGNOSIS — D539 Nutritional anemia, unspecified: Secondary | ICD-10-CM

## 2021-01-26 DIAGNOSIS — J9601 Acute respiratory failure with hypoxia: Secondary | ICD-10-CM

## 2021-01-26 LAB — ECHOCARDIOGRAM COMPLETE
AR max vel: 2.48 cm2
AV Area VTI: 2.62 cm2
AV Area mean vel: 2.78 cm2
AV Mean grad: 5 mmHg
AV Peak grad: 11.4 mmHg
Ao pk vel: 1.69 m/s
Area-P 1/2: 3.46 cm2
MV VTI: 2.98 cm2
S' Lateral: 3.1 cm
Weight: 2800.72 oz

## 2021-01-26 LAB — PHOSPHORUS: Phosphorus: 5 mg/dL — ABNORMAL HIGH (ref 2.5–4.6)

## 2021-01-26 LAB — COMPREHENSIVE METABOLIC PANEL
ALT: 56 U/L — ABNORMAL HIGH (ref 0–44)
AST: 173 U/L — ABNORMAL HIGH (ref 15–41)
Albumin: 3.7 g/dL (ref 3.5–5.0)
Alkaline Phosphatase: 125 U/L (ref 38–126)
Anion gap: 10 (ref 5–15)
BUN: 30 mg/dL — ABNORMAL HIGH (ref 6–20)
CO2: 29 mmol/L (ref 22–32)
Calcium: 9.3 mg/dL (ref 8.9–10.3)
Chloride: 110 mmol/L (ref 98–111)
Creatinine, Ser: 1.15 mg/dL — ABNORMAL HIGH (ref 0.44–1.00)
GFR, Estimated: 56 mL/min — ABNORMAL LOW (ref >60)
Glucose, Bld: 194 mg/dL — ABNORMAL HIGH (ref 70–99)
Potassium: 4 mmol/L (ref 3.5–5.1)
Sodium: 149 mmol/L — ABNORMAL HIGH (ref 135–145)
Total Bilirubin: 8.3 mg/dL — ABNORMAL HIGH (ref 0.3–1.2)
Total Protein: 7.7 g/dL (ref 6.5–8.1)

## 2021-01-26 LAB — GLUCOSE, CAPILLARY
Glucose-Capillary: 171 mg/dL — ABNORMAL HIGH (ref 70–99)
Glucose-Capillary: 174 mg/dL — ABNORMAL HIGH (ref 70–99)
Glucose-Capillary: 162 mg/dL — ABNORMAL HIGH (ref 70–99)
Glucose-Capillary: 168 mg/dL — ABNORMAL HIGH (ref 70–99)
Glucose-Capillary: 175 mg/dL — ABNORMAL HIGH (ref 70–99)

## 2021-01-26 LAB — PROCALCITONIN: Procalcitonin: 0.37 ng/mL

## 2021-01-26 LAB — MAGNESIUM: Magnesium: 2.5 mg/dL — ABNORMAL HIGH (ref 1.7–2.4)

## 2021-01-26 LAB — VITAMIN B12: Vitamin B-12: 648 pg/mL (ref 180–914)

## 2021-01-26 MED ORDER — HEPARIN SODIUM (PORCINE) 5000 UNIT/ML IJ SOLN
5000.0000 [IU] | Freq: Three times a day (TID) | INTRAMUSCULAR | Status: DC
  Administered 2021-01-26 – 2021-02-07 (×35): 5000 [IU] via SUBCUTANEOUS
  Filled 2021-01-26 (×35): qty 1

## 2021-01-26 MED ORDER — LACTULOSE 10 GM/15ML PO SOLN
30.0000 g | Freq: Two times a day (BID) | ORAL | Status: DC
  Administered 2021-01-26: 30 g
  Filled 2021-01-26: qty 60

## 2021-01-26 MED ORDER — FREE WATER
100.0000 mL | Status: DC
  Administered 2021-01-26 – 2021-02-01 (×34): 100 mL

## 2021-01-26 MED ORDER — LACTATED RINGERS IV SOLN: INTRAVENOUS | Status: DC

## 2021-01-26 MED ORDER — LACTATED RINGERS IV BOLUS
1000.0000 mL | Freq: Once | INTRAVENOUS | Status: AC
  Administered 2021-01-26: 1000 mL via INTRAVENOUS

## 2021-01-26 NOTE — Progress Notes (Signed)
NAME:  [name] Carrillo, MRN:  [id number], DOB:  1965-07-12, LOS: 6 ADMISSION DATE:  01/20/2021, CONSULTATION DATE:  01/24/21 REFERRING MD:  Priscella Mann, CHIEF COMPLAINT:  unresponsive   History of Present Illness:  55 yo F wthi hx of MDD, Anxiety disorder, osteomyelitis of left tibia, left tibial fracture history, recurrent UTIs, EtOH abuse and liver Cirrhosis ([place]), active alcoholism, tobacco abuse with multiple readmissions to hospital with EtOH withdrawal syndrome and now being brought into MICU due to unstable vital signs and aggitated hyperactive delerium with concerns for delerium tremens. Blood work is notable for transaminitis with decreased synthetic function.     Patient had ziplock bag with two teeth in there due to loose teeth in mouth. Husband shares that patient has few false teeth that she had created herself.    Ive met with husband and son and explained that patient is unresponsive with GCS7 and is not able to protect airway. They understand the severity of situation and agree for intubation if needed. We discussed goals of care and patient is full code.   Pertinent  Medical History  MDD Anxiety Alcohol use disorder Alcoholic cirrhosis Smoking  Significant Hospital Events: Including procedures, antibiotic start and stop dates in addition to other pertinent events   10/24 transfer to ICU, intubation, CVL placed  Interim History / Subjective:  Easily agitated as soon as sedation is titrated down Remained afebrile  Objective   Blood pressure (!) 87/75, pulse 75, temperature 98.6 F (37 C), temperature source Esophageal, resp. rate 13, height _0  (1.651 m), weight 79.4 kg, last menstrual period 09/07/2015, SpO2 98 %.    Vent Mode: PRVC FiO2 (%):  [40 %-50 %] 40 % Set Rate:  [16 bmp] 16 bmp Vt Set:  [400 mL] 400 mL PEEP:  [5 cmH20] 5 cmH20 Plateau Pressure:  [17 cmH20-19 cmH20] 19 cmH20   Intake/Output Summary (Last 24 hours) at 01/26/2021 1601 Last data filed at  01/26/2021 9728 Gross per 24 hour  Intake 1354.81 ml  Output 140 ml  Net 1214.81 ml   Filed Weights   01/19/21 1338 01/24/21 1549  Weight: 81.6 kg 79.4 kg    Examination:   Physical exam: General: Crtitically ill-appearing middle-aged Caucasian female, orally intubated HEENT: [place]/AT, eyes anicteric.  ETT and OGT in place Neuro: Sedated, not following commands.  Eyes are closed.  Pupils 3 mm bilateral reactive to light Chest: Coarse breath sounds, no wheezes or rhonchi Heart: Regular rate and rhythm, no murmurs or gallops Abdomen: Soft, nontender, nondistended, bowel sounds present Skin: No rash   Appropriate response to transfusion of 2U pRBC UCx 10/18 with E coli, Klebsiella  Resolved Hospital Problem list       Assessment & Plan:   Acute toxic /metabolic encephalopathy due to hepatic encephalopathy Moderate-severe alcohol withdrawal with delirium Continue Precedex Continue Valium via G-tube Continue as needed Versed Monitor CIWA protocol Continue thiamine, folate and multivitamins Had large stool output, decrease lactulose to twice daily  UTI due to E. coli/Klebsiella Continue ceftriaxone to complete 5 days therapy  Acute hypoxic respiratory failure Continue lung protective ventilation SAT/SBT as tolerated VAP bundle  Acute alcoholic hepatitis Does not meet criteria for steroid LFTs are trending down  Hypernatremia/hypophosphatemia  Acute kidney injury due to dehydration Patient clinically looks dehydrated We will give her 1 L of fluid bolus Continue IV fluid and free water Monitor serum creatinine and electrolyte  Severe protein calorie malnutrition Continue tube feeds  Macrocytic anemia due to folate deficiency Thrombocytopenia Likely  driven by liver disease/alcoholism Continue folic acid supplementation   Best Practice (right click and "Reselect all SmartList Selections" daily)   Diet/type: tubefeeds DVT prophylaxis: SCD GI prophylaxis:  PPI Lines: Central line Foley:  N/A Code Status:  full code Last date of multidisciplinary goals of care discussion [pending  Critical care time:    Total critical care time: 42 minutes  Performed by: [place] care time was exclusive of separately billable procedures and treating other patients.   Critical care was necessary to treat or prevent imminent or life-threatening deterioration.   Critical care was time spent personally by me on the following activities: development of treatment plan with patient and/or surrogate as well as nursing, discussions with consultants, evaluation of patient's response to treatment, examination of patient, obtaining history from patient or surrogate, ordering and performing treatments and interventions, ordering and review of laboratory studies, ordering and review of radiographic studies, pulse oximetry and re-evaluation of patient's condition.   Jacky Kindle MD [facility] See Amion for pager If no response to pager, please call [phone] until 7pm After 7pm, Please call E-link [phone]

## 2021-01-26 NOTE — Plan of Care (Signed)
  Problem: Education: Goal: Knowledge of General Education information will improve Description: Including pain rating scale, medication(s)/side effects and non-pharmacologic comfort measures Outcome: Not Progressing   Problem: Health Behavior/Discharge Planning: Goal: Ability to manage health-related needs will improve Outcome: Not Progressing   Problem: Clinical Measurements: Goal: Ability to maintain clinical measurements within normal limits will improve Outcome: Not Progressing Goal: Will remain free from infection Outcome: Not Progressing Goal: Diagnostic test results will improve Outcome: Not Progressing Goal: Respiratory complications will improve Outcome: Not Progressing Goal: Cardiovascular complication will be avoided Outcome: Not Progressing   Problem: Activity: Goal: Risk for activity intolerance will decrease Outcome: Not Progressing   Problem: Nutrition: Goal: Adequate nutrition will be maintained Outcome: Not Progressing   Problem: Coping: Goal: Level of anxiety will decrease Outcome: Not Progressing   Problem: Elimination: Goal: Will not experience complications related to bowel motility Outcome: Not Progressing Goal: Will not experience complications related to urinary retention Outcome: Not Progressing   Problem: Pain Managment: Goal: General experience of comfort will improve Outcome: Not Progressing   Problem: Safety: Goal: Ability to remain free from injury will improve Outcome: Not Progressing   Problem: Skin Integrity: Goal: Risk for impaired skin integrity will decrease Outcome: Not Progressing   Problem: Respiratory: Goal: Ability to maintain a clear airway and adequate ventilation will improve Outcome: Not Progressing   

## 2021-01-26 NOTE — Progress Notes (Signed)
*  PRELIMINARY RESULTS* Echocardiogram 2D Echocardiogram has been performed.  [name] Carrillo 01/26/2021, 1:06 PM

## 2021-01-26 NOTE — Progress Notes (Signed)
Patient has only had 48ml of UOP since shift change, and only on day shift. BP 81/51, low dose Levo restarted. MD aware of UOP. Will continue to monitor.

## 2021-01-26 NOTE — Progress Notes (Signed)
Nutrition Follow-up  DOCUMENTATION CODES:   Not applicable  INTERVENTION:   Continue Vital 1.2 @ 45 ml/hr and increase by 81ml/hr every 8 hours to goal rate of 55 ml/hr via OGT   Pro-Source 43ml daily via tube, provides 40kcal and 11g of protein per serving    100 ml free water flush every 4 hours   Regimen provides 1624kcal/day, 110g/day protein and 1071 ml/day free water. Total free water: 1671 ml daily   Pt at high refeed risk; recommend monitor potassium, magnesium and phosphorus labs daily until stable  NUTRITION DIAGNOSIS:   Inadequate oral intake related to inability to eat (pt sedated and ventilated) as evidenced by NPO status.  Ongoing  GOAL:   Provide needs based on ASPEN/SCCM guidelines  Progressing   MONITOR:   Vent status, Labs, Weight trends, Skin, I & O's, TF tolerance  REASON FOR ASSESSMENT:   Consult, Ventilator Assessment of nutrition requirement/status  ASSESSMENT:   55 y/o female with h/o etoh abuse, anxiety, depression and cirrhosis who is admitted with encephalopathy.  Patient is currently intubated on ventilator support MV: 8 L/min Temp (24hrs), Avg:98 F (36.7 C), Min:96.8 F (36 C), Max:98.8 F (37.1 C)  Reviewed I/O's: +1.7 L x 24 hours and +4 L since admission  UOP: 215 ml x 24 hours  Case discussed with MD, RN, and during ICU rounds.   Per RN, pt has had 2 BMs today and remains on lactulose.   Pt currently receiving Vital 1.5 @ 45 ml/hr and tolerating well (rate was just advanced this morning). Also receiving 45 ml Prosource Plus BID.   Plan to increase free water flushes secondary to hypernatremia.   Medications reviewed and include colace, folic acid, lactulose, miralax, thiamine, precedex, fentanyl, levophed, and lactated ringers infusion @ 125 ml/hr.  Labs reviewed: K WDL. Phos: 5, Mg: 2.5.  Diet Order:   Diet Order             Diet NPO time specified  Diet effective now                   EDUCATION NEEDS:    No education needs have been identified at this time  Skin:  Skin Assessment: Reviewed RN Assessment (ecchymosis)  Last BM:  01/25/21 (type 7)  Height:   Ht Readings from Last 1 Encounters:  01/19/21 5\' 5"  (1.651 m)    Weight:   Wt Readings from Last 1 Encounters:  01/24/21 79.4 kg    Ideal Body Weight:  56.8 kg  BMI:  Body mass index is 29.13 kg/m.  Estimated Nutritional Needs:   Kcal:  1386  Protein:  100-115 grams  Fluid:  > 1.4 L    01/26/21, RD, LDN, CDCES Registered Dietitian II Certified Diabetes Care and Education Specialist Please refer to [facility] for RD and/or RD on-call/weekend/after hours pager

## 2021-01-27 DIAGNOSIS — G9341 Metabolic encephalopathy: Secondary | ICD-10-CM | POA: Diagnosis not present

## 2021-01-27 LAB — GLUCOSE, CAPILLARY
Glucose-Capillary: 145 mg/dL — ABNORMAL HIGH (ref 70–99)
Glucose-Capillary: 149 mg/dL — ABNORMAL HIGH (ref 70–99)
Glucose-Capillary: 162 mg/dL — ABNORMAL HIGH (ref 70–99)
Glucose-Capillary: 164 mg/dL — ABNORMAL HIGH (ref 70–99)
Glucose-Capillary: 164 mg/dL — ABNORMAL HIGH (ref 70–99)
Glucose-Capillary: 178 mg/dL — ABNORMAL HIGH (ref 70–99)
Glucose-Capillary: 179 mg/dL — ABNORMAL HIGH (ref 70–99)

## 2021-01-27 LAB — COMPREHENSIVE METABOLIC PANEL
ALT: 54 U/L — ABNORMAL HIGH (ref 0–44)
AST: 179 U/L — ABNORMAL HIGH (ref 15–41)
Albumin: 3.2 g/dL — ABNORMAL LOW (ref 3.5–5.0)
Alkaline Phosphatase: 114 U/L (ref 38–126)
Anion gap: 6 (ref 5–15)
BUN: 38 mg/dL — ABNORMAL HIGH (ref 6–20)
CO2: 30 mmol/L (ref 22–32)
Calcium: 8.5 mg/dL — ABNORMAL LOW (ref 8.9–10.3)
Chloride: 109 mmol/L (ref 98–111)
Creatinine, Ser: 0.97 mg/dL (ref 0.44–1.00)
GFR, Estimated: >60 mL/min (ref >60)
Glucose, Bld: 176 mg/dL — ABNORMAL HIGH (ref 70–99)
Potassium: 3.6 mmol/L (ref 3.5–5.1)
Sodium: 145 mmol/L (ref 135–145)
Total Bilirubin: 7.1 mg/dL — ABNORMAL HIGH (ref 0.3–1.2)
Total Protein: 6.8 g/dL (ref 6.5–8.1)

## 2021-01-27 LAB — MAGNESIUM: Magnesium: 2.2 mg/dL (ref 1.7–2.4)

## 2021-01-27 MED ORDER — INFLUENZA VAC SPLIT QUAD 0.5 ML IM SUSY
0.5000 mL | PREFILLED_SYRINGE | INTRAMUSCULAR | Status: DC
  Filled 2021-01-27: qty 0.5

## 2021-01-27 MED ORDER — LACTULOSE 10 GM/15ML PO SOLN
30.0000 g | Freq: Every day | ORAL | Status: DC
  Administered 2021-01-28 – 2021-01-29 (×2): 30 g
  Filled 2021-01-27 (×2): qty 60

## 2021-01-27 MED ORDER — ASCORBIC ACID 500 MG PO TABS
500.0000 mg | ORAL_TABLET | Freq: Two times a day (BID) | ORAL | Status: DC
  Administered 2021-01-27 – 2021-02-13 (×34): 500 mg
  Filled 2021-01-27 (×35): qty 1

## 2021-01-27 NOTE — Progress Notes (Signed)
Fentanyl titrated off at 16:23 for neurological assessment and possible spontaneous breathing trial.

## 2021-01-27 NOTE — Progress Notes (Signed)
While doing mouth care this morning top front tooth was no longer intact.  Tooth removed from mouth and placed in labeled specimen cup.  No bleeding noted where tooth was missing.

## 2021-01-27 NOTE — Progress Notes (Signed)
NAME:  [name] Carrillo, MRN:  [id number], DOB:  1965-12-10, LOS: 7 ADMISSION DATE:  01/20/2021, CONSULTATION DATE:  01/24/21 REFERRING MD:  Priscella Mann, CHIEF COMPLAINT:  unresponsive   History of Present Illness:  55 yo F wthi hx of MDD, Anxiety disorder, osteomyelitis of left tibia, left tibial fracture history, recurrent UTIs, EtOH abuse and liver Cirrhosis ([place]), active alcoholism, tobacco abuse with multiple readmissions to hospital with EtOH withdrawal syndrome and now being brought into MICU due to unstable vital signs and aggitated hyperactive delerium with concerns for delerium tremens. Blood work is notable for transaminitis with decreased synthetic function.     Patient had ziplock bag with two teeth in there due to loose teeth in mouth. Husband shares that patient has few false teeth that she had created herself.    Ive met with husband and son and explained that patient is unresponsive with GCS7 and is not able to protect airway. They understand the severity of situation and agree for intubation if needed. We discussed goals of care and patient is full code.   Pertinent  Medical History  MDD Anxiety Alcohol use disorder Alcoholic cirrhosis Smoking  Significant Hospital Events: Including procedures, antibiotic start and stop dates in addition to other pertinent events   10/24 transfer to ICU, intubation, CVL placed  Interim History / Subjective:  Little UOP with IVF, 100g albumin challenge.   Objective   Blood pressure (!) 88/42, pulse 72, temperature 99.7 F (37.6 C), resp. rate 17, height '5\' 5"'  (1.651 m), weight 79.4 kg, last menstrual period 09/07/2015, SpO2 96 %.    Vent Mode: PRVC FiO2 (%):  [35 %-80 %] 35 % Set Rate:  [16 bmp-50 bmp] 16 bmp Vt Set:  [400 mL] 400 mL PEEP:  [5 cmH20] 5 cmH20 Plateau Pressure:  [16 cmH20-17 cmH20] 17 cmH20   Intake/Output Summary (Last 24 hours) at 01/27/2021 1409 Last data filed at 01/27/2021 1300 Gross per 24 hour  Intake 3510.44  ml  Output 475 ml  Net 3035.44 ml   Filed Weights   01/19/21 1338 01/24/21 1549  Weight: 81.6 kg 79.4 kg    Examination:   Physical exam: General appearance: 55 y.o., female, intubated, sedated Eyes: icteric sclerae, pinpoint pupils Lungs:mech breath sounds bl, equal chest rise CV: RRR, no MRGs  Abdomen: Soft, non-tender; distended, BS present  Extremities: trace peripheral edema, warm Skin: Normal temperature, turgor and texture; no rash Neuro: deeply sedated  UCx 10/18 with E coli, Klebsiella  Resolved Hospital Problem list       Assessment & Plan:   Acute toxic /metabolic encephalopathy due to hepatic encephalopathy Moderate-severe alcohol withdrawal with delirium Continue Precedex Continue Valium via G-tube Continue as needed Versed Monitor CIWA protocol Continue thiamine, folate and multivitamins Decrease lactulose to once daily SAT/SBT  UTI due to E. coli/Klebsiella Aspiration pneumonia Continue ceftriaxone to complete 5 days therapy  Acute hypoxic respiratory failure Aspiration pneumonia Full vent support SAT/SBT as tolerated VAP bundle  Acute alcoholic hepatitis Does not meet criteria for steroid LFTs are trending down  Hypernatremia/hypophosphatemia  Oliguric Acute kidney injury - ?HRS  Stop IV fluids - while S Cr decreased somewhat, bedside US with distended IVC and she has had little urine output Monitor serum creatinine and electrolyte  Severe protein calorie malnutrition Continue tube feeds  Macrocytic anemia due to folate deficiency Thrombocytopenia Likely driven by liver disease/alcoholism Continue folic acid supplementation   Best Practice (right click and "Reselect all SmartList Selections" daily)   Diet/type: tubefeeds DVT  prophylaxis: SCD GI prophylaxis: PPI Lines: Central line Foley:  N/A Code Status:  full code Last date of multidisciplinary goals of care discussion [pending  Critical care time: 28 minutes    [place]

## 2021-01-27 NOTE — Progress Notes (Signed)
Patient with copious amounts of brown liquid stool from rectum.  Order received to place rectal tube.

## 2021-01-27 NOTE — TOC Progression Note (Signed)
Transition of Care [facility]) - Progression Note    Patient Details  Name: [name] Carrillo MRN: [id number] Date of Birth: 1965/11/29  [facility] CM/SW Contact  Marina Goodell Phone Number: [phone] 01/27/2021, 9:57 AM  Clinical Narrative:     Patient moved to ICU due unstable vital signs and aggitated hyperactive delerium with concerns for delerium tremens. Patient is currently intubated/sedated.  Patient's main contact spouse Mio Schellinger [phone].  TOC will continue to follow.   Expected Discharge Plan: Home/Self Care Barriers to Discharge: Continued Medical Work up  Expected Discharge Plan and Services Expected Discharge Plan: Home/Self Care     Post Acute Care Choice: NA Living arrangements for the past 2 months: Single Family Home                                       Social Determinants of Health (SDOH) Interventions    Readmission Risk Interventions No flowsheet data found. --

## 2021-01-27 NOTE — Progress Notes (Signed)
Precedex titrated off at 16:48.  Patient still very sleepy.  Patient not following any commands at this time but remains calm.

## 2021-01-28 ENCOUNTER — Inpatient Hospital Stay: Payer: BC Managed Care – PPO

## 2021-01-28 DIAGNOSIS — G9341 Metabolic encephalopathy: Secondary | ICD-10-CM | POA: Diagnosis not present

## 2021-01-28 LAB — BASIC METABOLIC PANEL
Anion gap: 8 (ref 5–15)
BUN: 38 mg/dL — ABNORMAL HIGH (ref 6–20)
CO2: 26 mmol/L (ref 22–32)
Calcium: 8.3 mg/dL — ABNORMAL LOW (ref 8.9–10.3)
Chloride: 108 mmol/L (ref 98–111)
Creatinine, Ser: 0.72 mg/dL (ref 0.44–1.00)
GFR, Estimated: >60 mL/min (ref >60)
Glucose, Bld: 184 mg/dL — ABNORMAL HIGH (ref 70–99)
Potassium: 3.8 mmol/L (ref 3.5–5.1)
Sodium: 142 mmol/L (ref 135–145)

## 2021-01-28 LAB — HEPATIC FUNCTION PANEL
ALT: 60 U/L — ABNORMAL HIGH (ref 0–44)
AST: 191 U/L — ABNORMAL HIGH (ref 15–41)
Albumin: 2.8 g/dL — ABNORMAL LOW (ref 3.5–5.0)
Alkaline Phosphatase: 110 U/L (ref 38–126)
Bilirubin, Direct: 4.6 mg/dL — ABNORMAL HIGH (ref 0.0–0.2)
Indirect Bilirubin: 3.2 mg/dL — ABNORMAL HIGH (ref 0.3–0.9)
Total Bilirubin: 7.8 mg/dL — ABNORMAL HIGH (ref 0.3–1.2)
Total Protein: 6.5 g/dL (ref 6.5–8.1)

## 2021-01-28 LAB — HEMOGLOBIN A1C
Hgb A1c MFr Bld: 4.4 % — ABNORMAL LOW (ref 4.8–5.6)
Mean Plasma Glucose: 79.58 mg/dL

## 2021-01-28 LAB — GLUCOSE, CAPILLARY
Glucose-Capillary: 169 mg/dL — ABNORMAL HIGH (ref 70–99)
Glucose-Capillary: 177 mg/dL — ABNORMAL HIGH (ref 70–99)
Glucose-Capillary: 191 mg/dL — ABNORMAL HIGH (ref 70–99)
Glucose-Capillary: 192 mg/dL — ABNORMAL HIGH (ref 70–99)
Glucose-Capillary: 202 mg/dL — ABNORMAL HIGH (ref 70–99)
Glucose-Capillary: 216 mg/dL — ABNORMAL HIGH (ref 70–99)

## 2021-01-28 LAB — CBC
HCT: 27.1 % — ABNORMAL LOW (ref 36.0–46.0)
Hemoglobin: 8.8 g/dL — ABNORMAL LOW (ref 12.0–15.0)
MCH: 40.2 pg — ABNORMAL HIGH (ref 26.0–34.0)
MCHC: 32.5 g/dL (ref 30.0–36.0)
MCV: 123.7 fL — ABNORMAL HIGH (ref 80.0–100.0)
Platelets: 114 10*3/uL — ABNORMAL LOW (ref 150–400)
RBC: 2.19 MIL/uL — ABNORMAL LOW (ref 3.87–5.11)
WBC: 18.1 10*3/uL — ABNORMAL HIGH (ref 4.0–10.5)
nRBC: 0 % (ref 0.0–0.2)

## 2021-01-28 LAB — PHOSPHORUS: Phosphorus: 1.7 mg/dL — ABNORMAL LOW (ref 2.5–4.6)

## 2021-01-28 LAB — MAGNESIUM: Magnesium: 1.9 mg/dL (ref 1.7–2.4)

## 2021-01-28 LAB — AMMONIA: Ammonia: 132 umol/L — ABNORMAL HIGH (ref 9–35)

## 2021-01-28 IMAGING — DX DG ABDOMEN 1V
1 series · 1 of 1 positions shown · non-contrast
Comparison: [DATE]

CLINICAL DATA: Acute respiratory failure, hypoxia, gastric tube
placement

EXAM:
ABDOMEN - 1 VIEW

[abdomen supine]
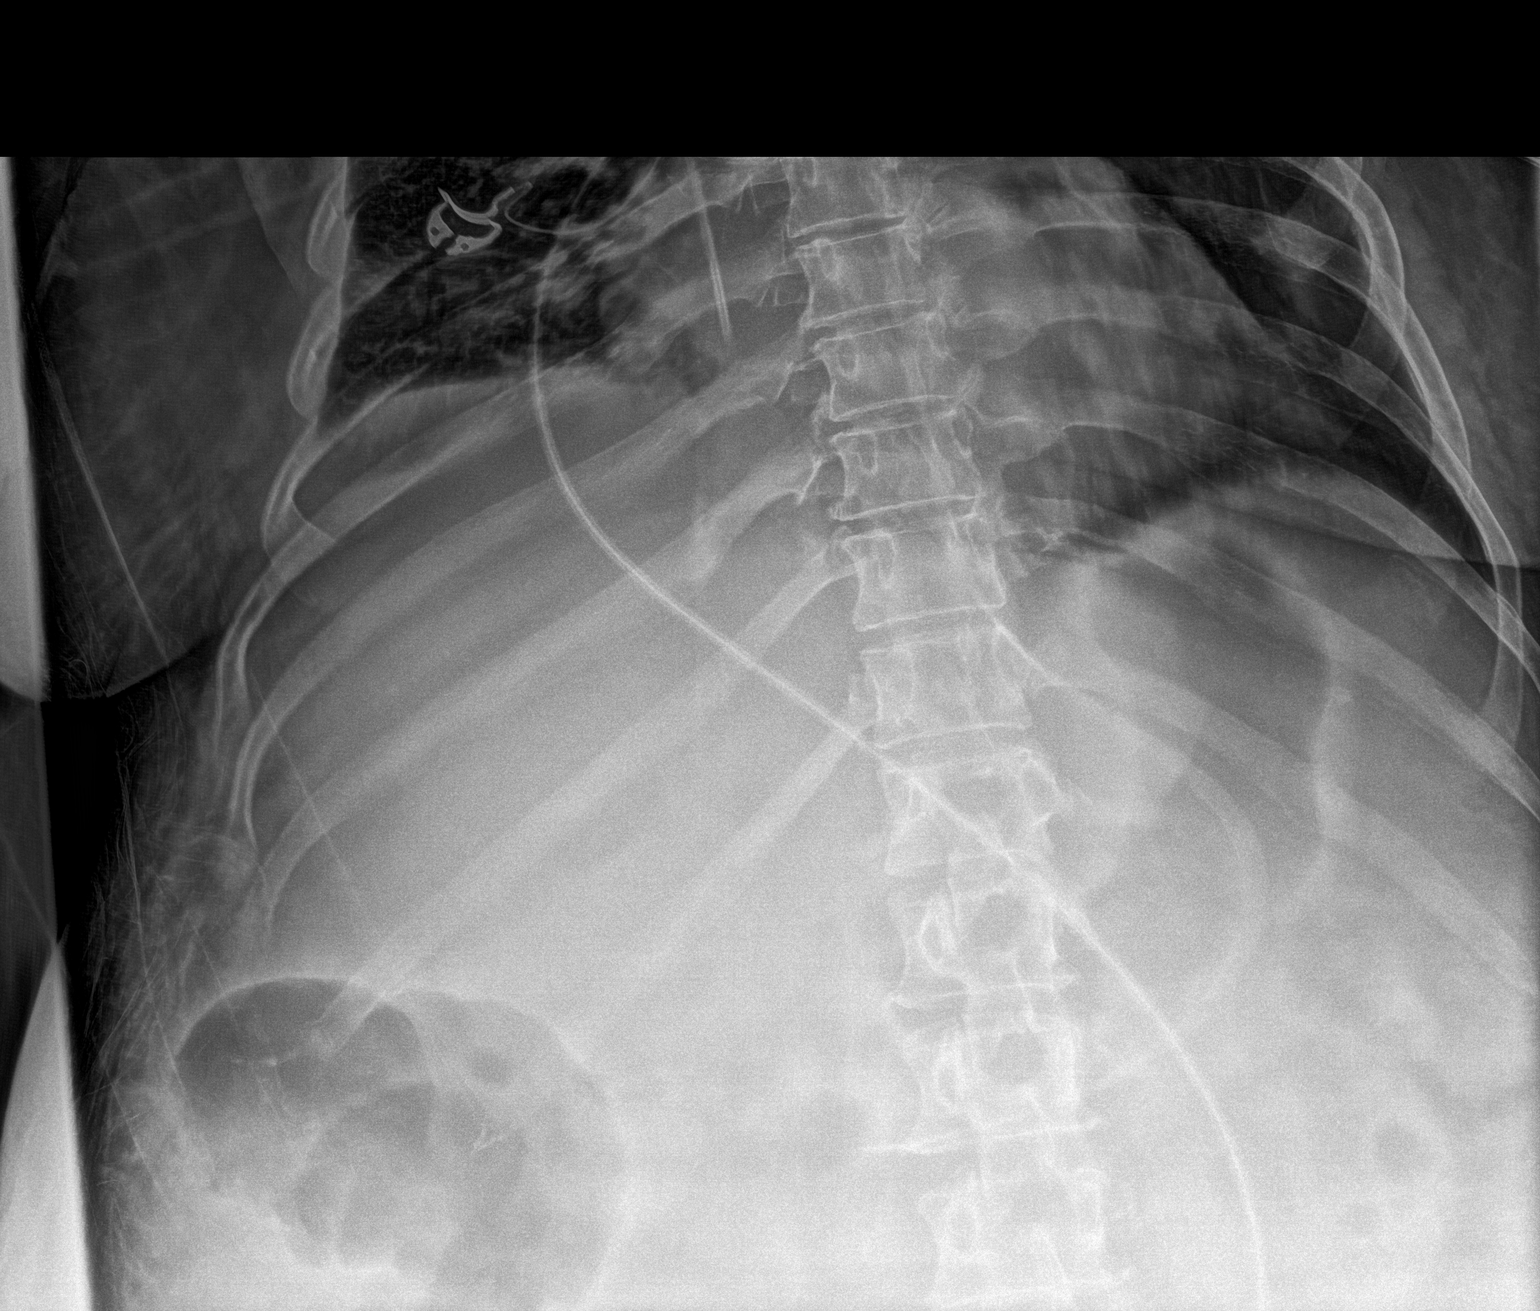

[1 of 1 positions shown; findings below may reference images not displayed]

FINDINGS: Gastric tube extends to the region of the gastric antrum. The
stomach is decompressed. Central venous catheter to the mid right
atrium. Gaseous distention of bowel in the right mid abdomen. The
lower abdomen is excluded
IMPRESSION: Gastric tube to the antrum of the decompressed stomach.

## 2021-01-28 IMAGING — DX DG CHEST 1V PORT
1 series · 1 of 1 positions shown · non-contrast
Comparison: [DATE]

CLINICAL DATA: Best obtainable images. Acute respiratory failure
with hypoxia, encounter for og tube placement

EXAM:
PORTABLE CHEST - 1 VIEW

[chest ap]
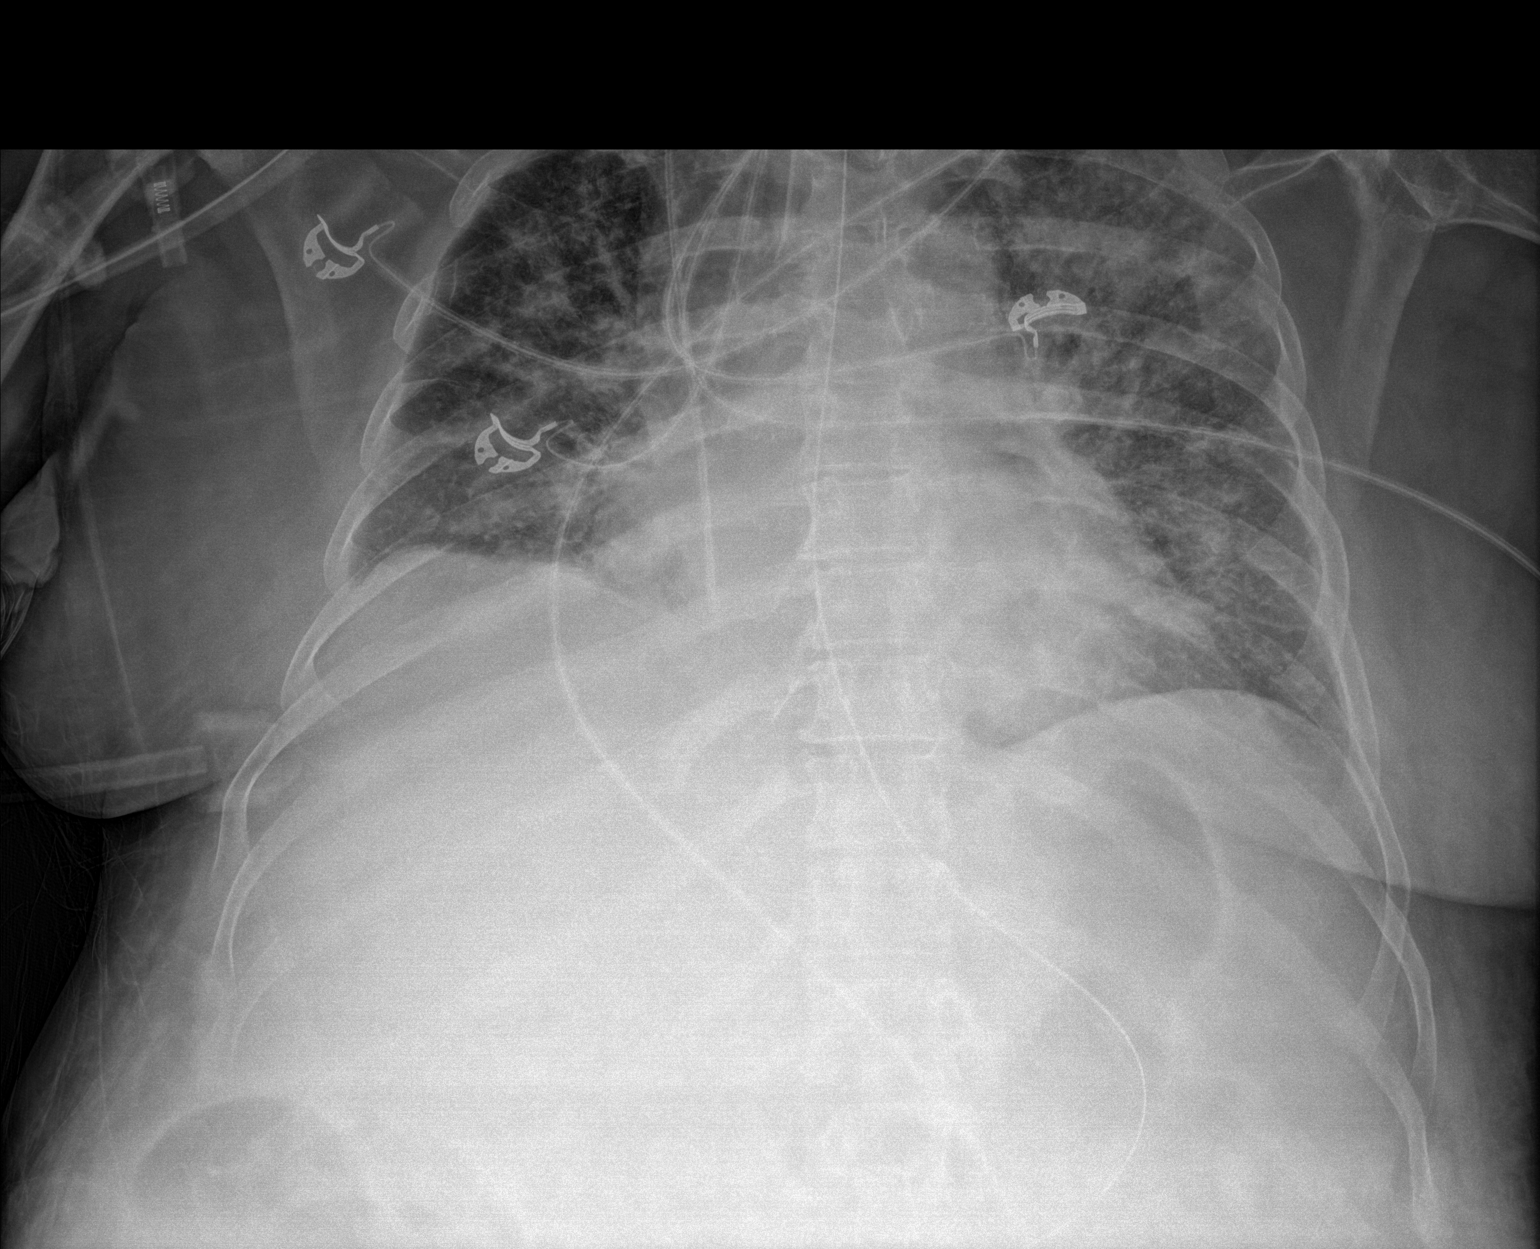

[1 of 1 positions shown; findings below may reference images not displayed]

FINDINGS: Endotracheal tube tip 3 cm above carina. Gastric tube extends to the
decompressed stomach. Right IJ central venous catheter to the mid
right atrium. Patchy airspace opacities throughout both lungs, left
greater than right, increased since previous.

Heart size and mediastinal contours are within normal limits.

No effusion.

Visualized bones unremarkable.
IMPRESSION: 1. Worsening asymmetric infiltrates or edema, left greater than
right.
2. Support hardware in stable position.

## 2021-01-28 MED ORDER — FUROSEMIDE 10 MG/ML IJ SOLN
40.0000 mg | INTRAMUSCULAR | Status: AC
  Administered 2021-01-28: 40 mg via INTRAVENOUS
  Filled 2021-01-28: qty 4

## 2021-01-28 MED ORDER — INSULIN ASPART 100 UNIT/ML IJ SOLN
0.0000 [IU] | INTRAMUSCULAR | Status: DC
Start: 2021-01-28 — End: 2021-02-13
  Administered 2021-01-28: 3 [IU] via SUBCUTANEOUS
  Administered 2021-01-28: 5 [IU] via SUBCUTANEOUS
  Administered 2021-01-29 (×2): 3 [IU] via SUBCUTANEOUS
  Administered 2021-01-29: 5 [IU] via SUBCUTANEOUS
  Administered 2021-01-29 – 2021-01-30 (×5): 3 [IU] via SUBCUTANEOUS
  Administered 2021-01-30: 5 [IU] via SUBCUTANEOUS
  Administered 2021-01-30 – 2021-01-31 (×8): 3 [IU] via SUBCUTANEOUS
  Administered 2021-01-31: 5 [IU] via SUBCUTANEOUS
  Administered 2021-01-31: 3 [IU] via SUBCUTANEOUS
  Administered 2021-02-01: 5 [IU] via SUBCUTANEOUS
  Administered 2021-02-01: 2 [IU] via SUBCUTANEOUS
  Administered 2021-02-01 (×2): 3 [IU] via SUBCUTANEOUS
  Administered 2021-02-01: 5 [IU] via SUBCUTANEOUS
  Administered 2021-02-01: 2 [IU] via SUBCUTANEOUS
  Administered 2021-02-02 (×3): 3 [IU] via SUBCUTANEOUS
  Administered 2021-02-02 (×2): 5 [IU] via SUBCUTANEOUS
  Administered 2021-02-02 – 2021-02-03 (×5): 3 [IU] via SUBCUTANEOUS
  Administered 2021-02-03: 2 [IU] via SUBCUTANEOUS
  Administered 2021-02-04 – 2021-02-07 (×24): 3 [IU] via SUBCUTANEOUS
  Administered 2021-02-08: 2 [IU] via SUBCUTANEOUS
  Administered 2021-02-08: 3 [IU] via SUBCUTANEOUS
  Administered 2021-02-08: 2 [IU] via SUBCUTANEOUS
  Administered 2021-02-08 – 2021-02-09 (×5): 3 [IU] via SUBCUTANEOUS
  Administered 2021-02-09: 5 [IU] via SUBCUTANEOUS
  Administered 2021-02-09: 3 [IU] via SUBCUTANEOUS
  Administered 2021-02-09: 2 [IU] via SUBCUTANEOUS
  Administered 2021-02-09: 3 [IU] via SUBCUTANEOUS
  Administered 2021-02-09: 2 [IU] via SUBCUTANEOUS
  Administered 2021-02-10 (×6): 3 [IU] via SUBCUTANEOUS
  Administered 2021-02-11: 5 [IU] via SUBCUTANEOUS
  Administered 2021-02-11 – 2021-02-12 (×4): 3 [IU] via SUBCUTANEOUS
  Administered 2021-02-12: 2 [IU] via SUBCUTANEOUS
  Administered 2021-02-12 (×2): 3 [IU] via SUBCUTANEOUS
  Administered 2021-02-12 (×2): 2 [IU] via SUBCUTANEOUS
  Administered 2021-02-13 (×2): 3 [IU] via SUBCUTANEOUS
  Administered 2021-02-13: 2 [IU] via SUBCUTANEOUS
  Filled 2021-01-28 (×92): qty 1

## 2021-01-28 MED ORDER — FENTANYL CITRATE PF 50 MCG/ML IJ SOSY
50.0000 ug | PREFILLED_SYRINGE | INTRAMUSCULAR | Status: DC | PRN
  Administered 2021-01-28: 50 ug via INTRAVENOUS
  Filled 2021-01-28: qty 1

## 2021-01-28 MED ORDER — RIFAXIMIN 550 MG PO TABS
550.0000 mg | ORAL_TABLET | Freq: Two times a day (BID) | ORAL | Status: DC
  Administered 2021-01-28 – 2021-02-13 (×33): 550 mg
  Filled 2021-01-28 (×33): qty 1

## 2021-01-28 NOTE — Progress Notes (Signed)
Chaplain Maggie made initial visit with pt's husband to introduce pastoral care. Continued care available per on call chaplain.

## 2021-01-28 NOTE — Progress Notes (Addendum)
NAME:  [name] Carrillo, MRN:  [id number], DOB:  [birth year], LOS: 57 ADMISSION DATE:  01/20/2021, CONSULTATION DATE:  01/24/21 REFERRING MD:  Priscella Mann, CHIEF COMPLAINT:  unresponsive   History of Present Illness:  55 yo F wthi hx of MDD, Anxiety disorder, osteomyelitis of left tibia, left tibial fracture history, recurrent UTIs, EtOH abuse and liver Cirrhosis ([place]), active alcoholism, tobacco abuse with multiple readmissions to hospital with EtOH withdrawal syndrome and now being brought into MICU due to unstable vital signs and aggitated hyperactive delerium with concerns for delerium tremens. Blood work is notable for transaminitis with decreased synthetic function.     Patient had ziplock bag with two teeth in there due to loose teeth in mouth. Husband shares that patient has few false teeth that she had created herself.    Ive met with husband and son and explained that patient is unresponsive with GCS7 and is not able to protect airway. They understand the severity of situation and agree for intubation if needed. We discussed goals of care and patient is full code.   Pertinent  Medical History  MDD Anxiety Alcohol use disorder Alcoholic cirrhosis Smoking  Significant Hospital Events: Including procedures, antibiotic start and stop dates in addition to other pertinent events   10/24 transfer to ICU, intubation, CVL placed  Interim History / Subjective:  Pt remains mechanically intubated FiO2 40% with O2 sats low to mid 90's.  Pt severely encephalopathic attempting to get out of bed, but unable to follow commands.  Pt appears more edematous with crackles and rhonchi throughout    Objective   Blood pressure 131/70, pulse 86, temperature 100 F (37.8 C), temperature source Esophageal, resp. rate 16, height _0  (1.651 m), weight 79.4 kg, last menstrual period 09/07/2015, SpO2 92 %.    Vent Mode: PRVC FiO2 (%):  [35 %-40 %] 40 % Set Rate:  [16 bmp] 16 bmp Vt Set:  [400 mL] 400  mL PEEP:  [5 cmH20] 5 cmH20 Pressure Support:  [10 cmH20] 10 cmH20 Plateau Pressure:  [14 cmH20-17 cmH20] 14 cmH20   Intake/Output Summary (Last 24 hours) at 01/28/2021 6629 Last data filed at 01/28/2021 4765 Gross per 24 hour  Intake 7246.16 ml  Output 1775 ml  Net 5471.16 ml   Filed Weights   01/19/21 1338 01/24/21 1549  Weight: 81.6 kg 79.4 kg    Examination: General: acutely ill appearing female, NAD mechanically intubated  HENT: supple, no JVD  Lungs: rhonchi with crackles throughout, even, non labored Cardiovascular: sinus rhythm, no R/G, 2+ radial/1+ distal pulses, 1+ generalized edema  Abdomen: +BS x4, soft, obese, non distended Extremities: normal bulk and tone Neuro: sedated, not following commands, intermittent agitation withdraws from painful stimulation, PERRL  GU: indwelling foley catheter draining dark yellow urine    Resolved Hospital Problem list   Hypernatremia   Assessment & Plan:   Acute toxic /metabolic encephalopathy due to hepatic encephalopathy~worsening ammonia 10/27: 132  Moderate-severe alcohol withdrawal with delirium Continue Precedex gtt  Continue Valium via G-tube Continue as needed Versed Monitor CIWA protocol Continue thiamine, folate and multivitamins Continue lactulose 30 g per tube daily and will add rifaximin   UTI due to E. coli/Klebsiella Aspiration pneumonia Continue ceftriaxone to complete 5 days therapy  Acute hypoxic respiratory failure Aspiration pneumonia Full vent support for now  SAT/SBT as tolerated VAP bundle implemented Intermittent CXR and ABG   Acute alcoholic hepatitis Monitor hepatic function panel   Hypophosphatemia  Oliguric Acute kidney injury - ?HRS  Pt  12L positive will stop iv maintenance fluids and give 40 mg iv lasix x1 dose  Monitor BMP Replace electrolytes as indicated  Monitor UOP  Severe protein calorie malnutrition Dietitian consulted appreciate input~continue tube feeds  Macrocytic  anemia due to folate deficiency Thrombocytopenia Likely driven by liver disease/alcoholism Continue folic acid supplementation Trend CBC Monitor for s/sx of bleeding   Best Practice (right click and "Reselect all SmartList Selections" daily)   Diet/type: tubefeeds DVT prophylaxis: SCD; subq heparin  GI prophylaxis: PPI Lines: Central line Foley:  Yes and still indicated  Code Status:  full code Last date of multidisciplinary goals of care discussion [01/28/2021]  Critical care time: 30 minutes    Rosilyn Mings, [place] Pager [phone] (please enter 7 digits) PCCM Consult Pager [phone] (please enter 7 digits)

## 2021-01-29 ENCOUNTER — Inpatient Hospital Stay: Payer: BC Managed Care – PPO

## 2021-01-29 ENCOUNTER — Encounter: Payer: Self-pay | Admitting: Internal Medicine

## 2021-01-29 DIAGNOSIS — G9341 Metabolic encephalopathy: Secondary | ICD-10-CM | POA: Diagnosis not present

## 2021-01-29 LAB — CBC
HCT: 29.2 % — ABNORMAL LOW (ref 36.0–46.0)
Hemoglobin: 9.2 g/dL — ABNORMAL LOW (ref 12.0–15.0)
MCH: 38 pg — ABNORMAL HIGH (ref 26.0–34.0)
MCHC: 31.5 g/dL (ref 30.0–36.0)
MCV: 120.7 fL — ABNORMAL HIGH (ref 80.0–100.0)
Platelets: 135 10*3/uL — ABNORMAL LOW (ref 150–400)
RBC: 2.42 MIL/uL — ABNORMAL LOW (ref 3.87–5.11)
WBC: 20.6 10*3/uL — ABNORMAL HIGH (ref 4.0–10.5)
nRBC: 0 % (ref 0.0–0.2)

## 2021-01-29 LAB — BASIC METABOLIC PANEL
Anion gap: 5 (ref 5–15)
BUN: 49 mg/dL — ABNORMAL HIGH (ref 6–20)
CO2: 30 mmol/L (ref 22–32)
Calcium: 8.7 mg/dL — ABNORMAL LOW (ref 8.9–10.3)
Chloride: 106 mmol/L (ref 98–111)
Creatinine, Ser: 0.79 mg/dL (ref 0.44–1.00)
GFR, Estimated: >60 mL/min (ref >60)
Glucose, Bld: 200 mg/dL — ABNORMAL HIGH (ref 70–99)
Potassium: 3.8 mmol/L (ref 3.5–5.1)
Sodium: 141 mmol/L (ref 135–145)

## 2021-01-29 LAB — GLUCOSE, CAPILLARY
Glucose-Capillary: 179 mg/dL — ABNORMAL HIGH (ref 70–99)
Glucose-Capillary: 186 mg/dL — ABNORMAL HIGH (ref 70–99)
Glucose-Capillary: 190 mg/dL — ABNORMAL HIGH (ref 70–99)
Glucose-Capillary: 181 mg/dL — ABNORMAL HIGH (ref 70–99)
Glucose-Capillary: 166 mg/dL — ABNORMAL HIGH (ref 70–99)
Glucose-Capillary: 172 mg/dL — ABNORMAL HIGH (ref 70–99)

## 2021-01-29 LAB — PHOSPHORUS
Phosphorus: 1.9 mg/dL — ABNORMAL LOW (ref 2.5–4.6)
Phosphorus: 2.6 mg/dL (ref 2.5–4.6)

## 2021-01-29 LAB — PROCALCITONIN: Procalcitonin: 0.89 ng/mL

## 2021-01-29 LAB — BRAIN NATRIURETIC PEPTIDE: B Natriuretic Peptide: 1204.6 pg/mL — ABNORMAL HIGH (ref 0.0–100.0)

## 2021-01-29 LAB — POTASSIUM: Potassium: 3.9 mmol/L (ref 3.5–5.1)

## 2021-01-29 LAB — LACTIC ACID, PLASMA: Lactic Acid, Venous: 1.7 mmol/L (ref 0.5–1.9)

## 2021-01-29 LAB — MAGNESIUM: Magnesium: 1.9 mg/dL (ref 1.7–2.4)

## 2021-01-29 LAB — MRSA NEXT GEN BY PCR, NASAL: MRSA by PCR Next Gen: NOT DETECTED

## 2021-01-29 IMAGING — DX DG ABDOMEN 1V
2 series · 2 of 2 positions shown · non-contrast
Comparison: [DATE]

CLINICAL DATA: Abdominal distention

EXAM:
ABDOMEN - 1 VIEW

[abdomen supine (1 of 2)]
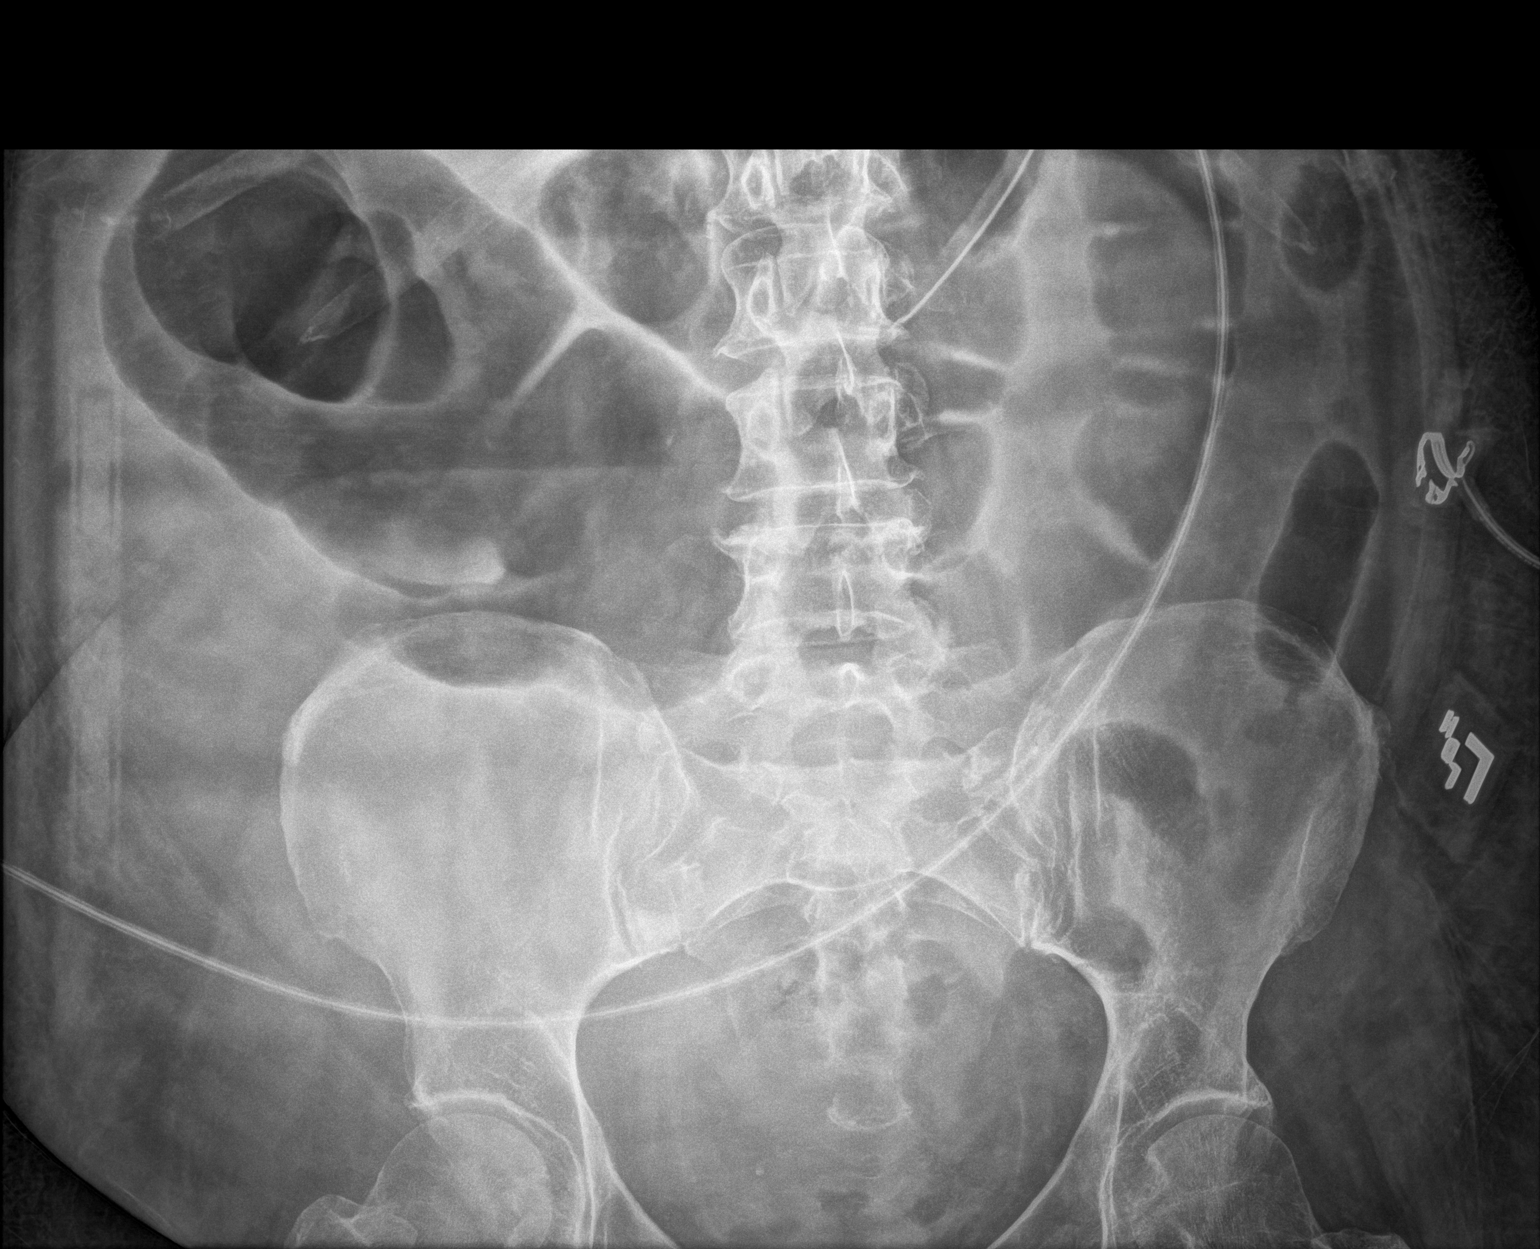

[abdomen supine (2 of 2)]
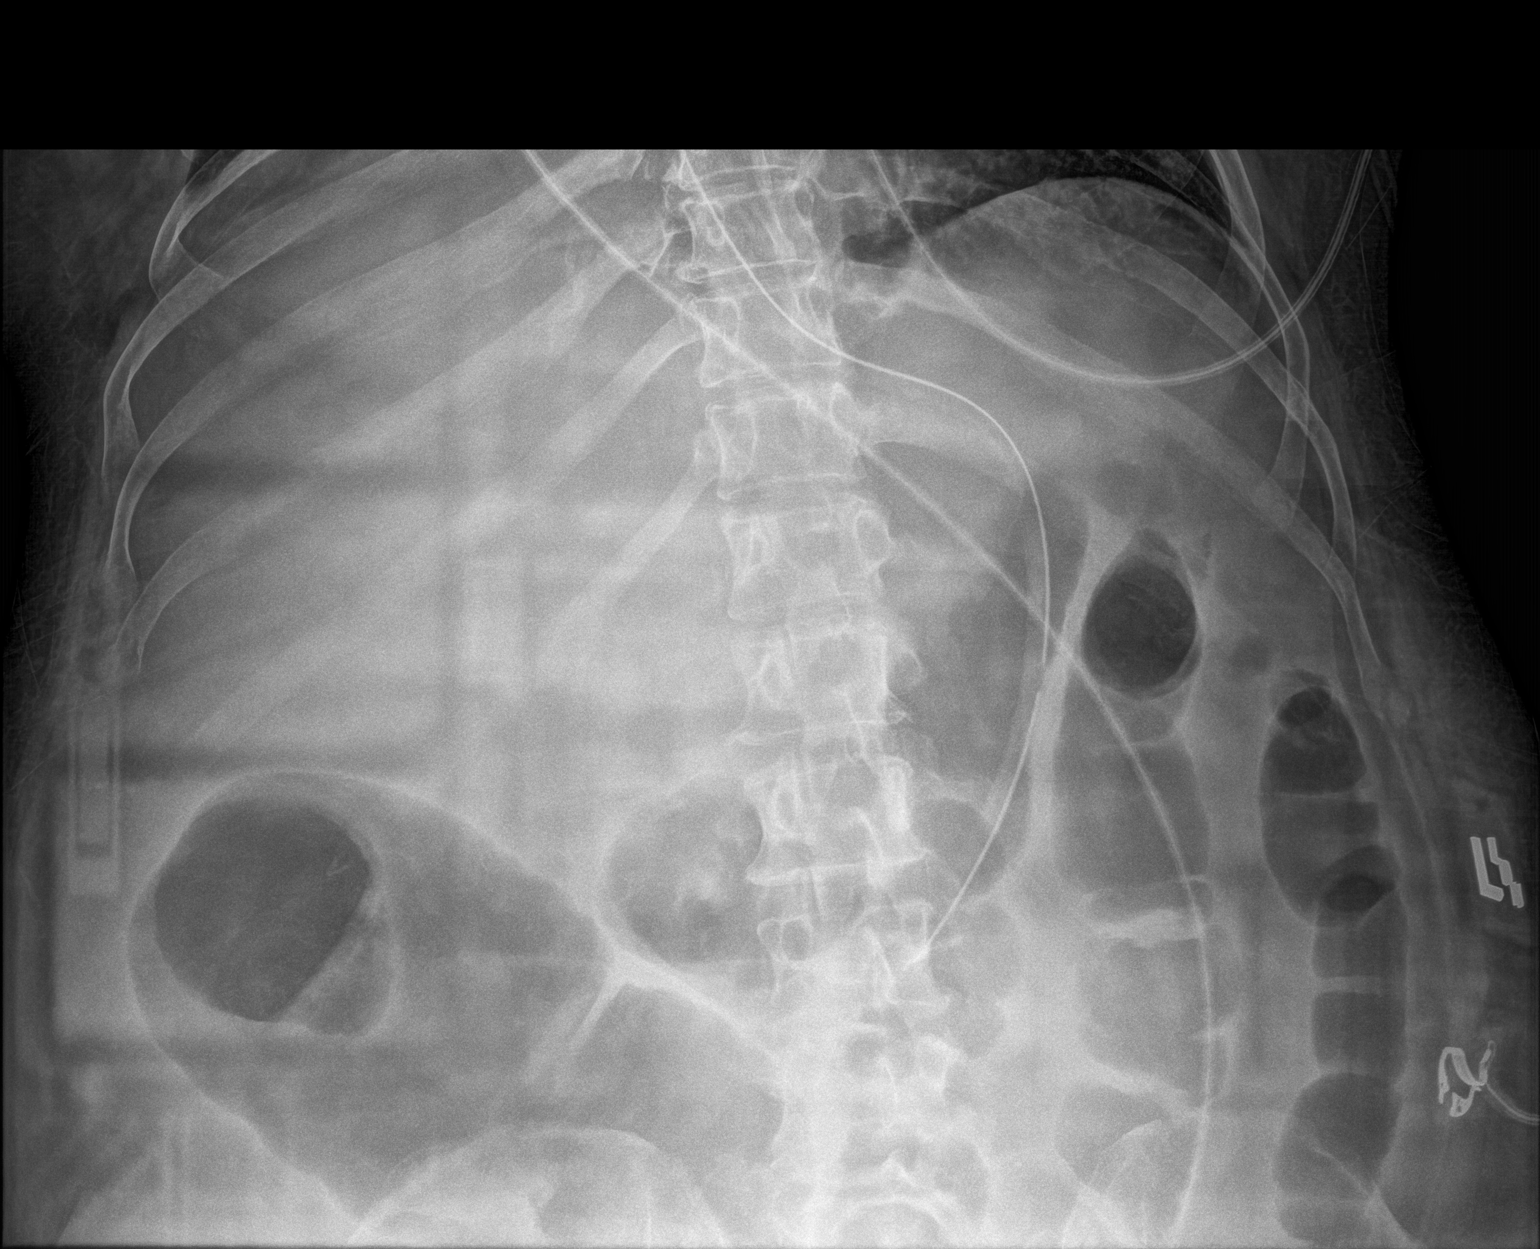

[2 of 2 positions shown; findings below may reference images not displayed]

FINDINGS: Tip of enteric tube is seen in the stomach. Stomach is not
distended. There is no significant small bowel dilation. There is
moderate gaseous distention of right colon.
IMPRESSION: There is no significant small bowel dilation. There is moderate
gaseous distention of right colon suggesting possible ileus.

Reading location: [place], [place].

## 2021-01-29 IMAGING — CT CT MAXILLOFACIAL W/ CM
3 of 4 series · 15 of 47 positions shown, 18 images · IV contrast (omnipaque)
Comparison: CT head without contrast [DATE] in [DATE]

CLINICAL DATA: Maxillofacial abscess. Encephalopathy. Possible
dental abscess.

EXAM:
CT MAXILLOFACIAL WITH CONTRAST
TECHNIQUE: Multidetector CT imaging of the maxillofacial structures was
performed with intravenous contrast. Multiplanar CT image
reconstructions were also generated.
CONTRAST:  75mL OMNIPAQUE IOHEXOL 300 MG/ML  SOLN

[Series 2: max soft · axial · 0.36mm/px · z∈[+134,+274]mm · 9 of 82 slices shown, 12 images]
[im 6/82  brain]
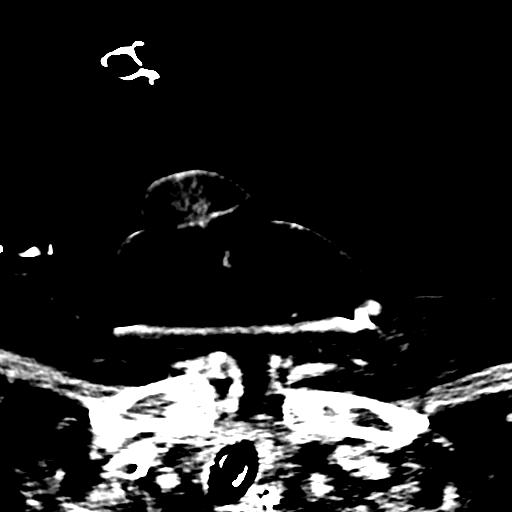
[im 6/82  bone]
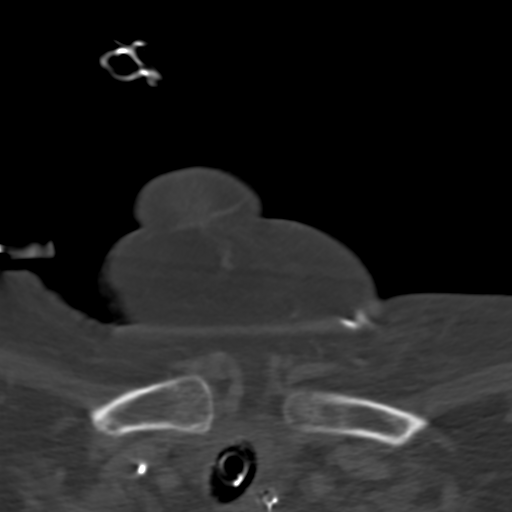
[im 15/82  bone]
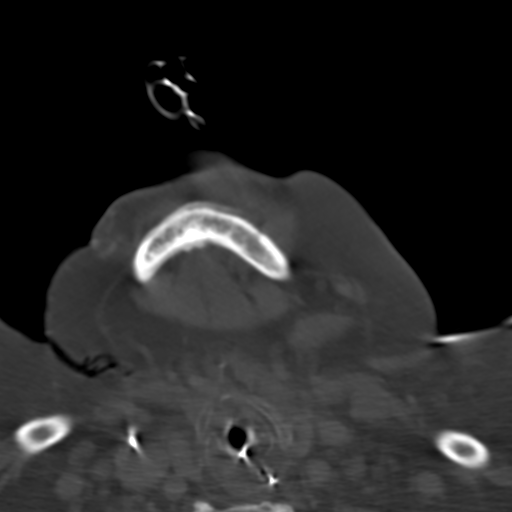
[im 24/82  bone]
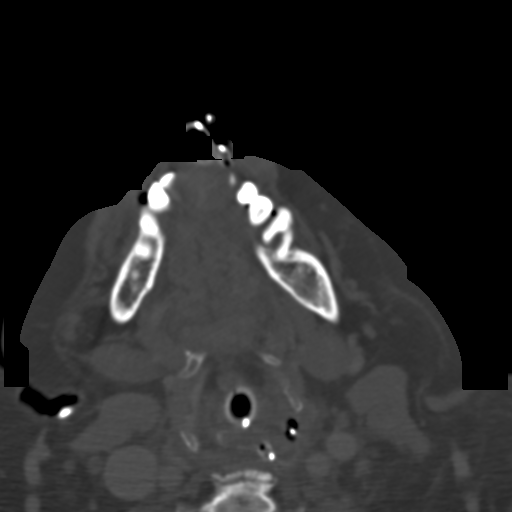
[im 32/82  bone]
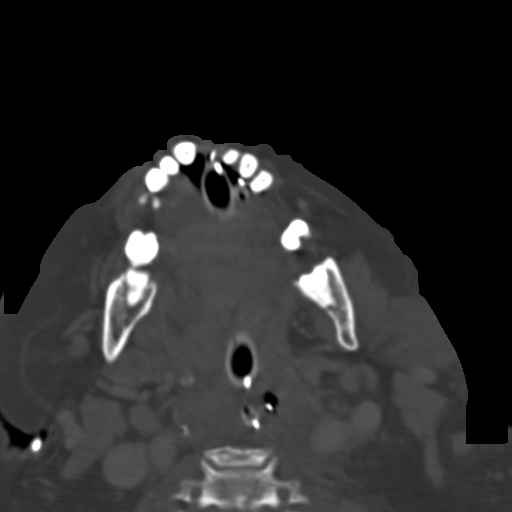
[im 41/82  brain]
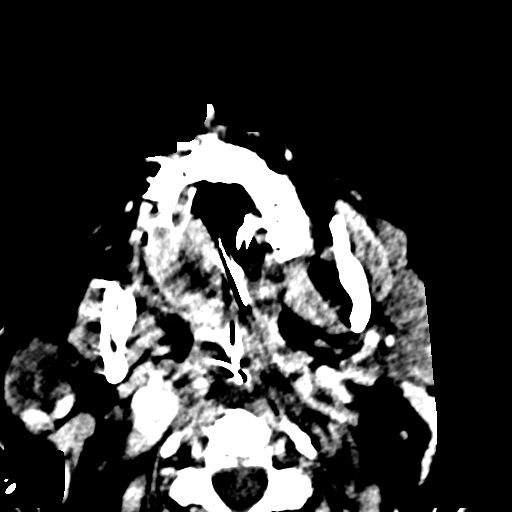
[im 41/82  bone]
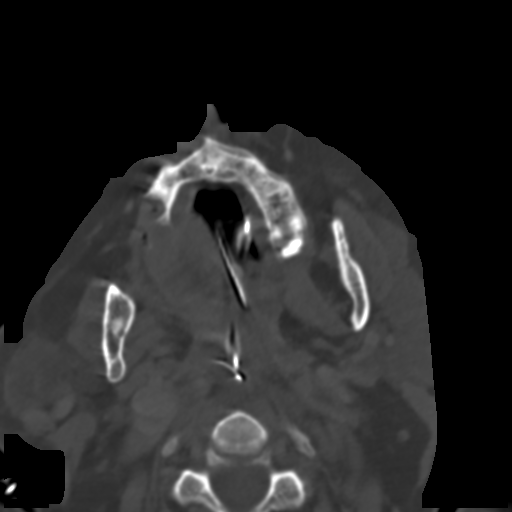
[im 50/82  bone]
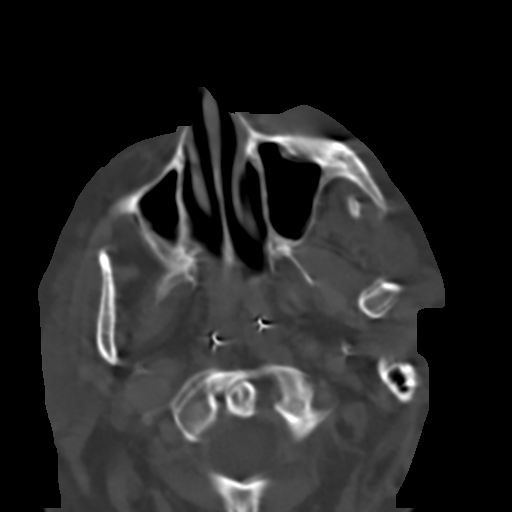
[im 58/82  bone]
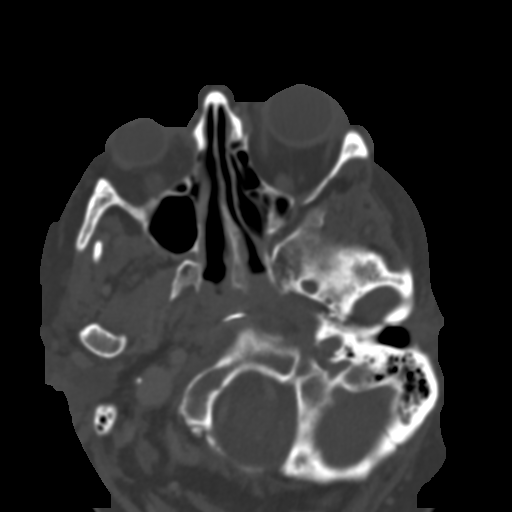
[im 67/82  bone]
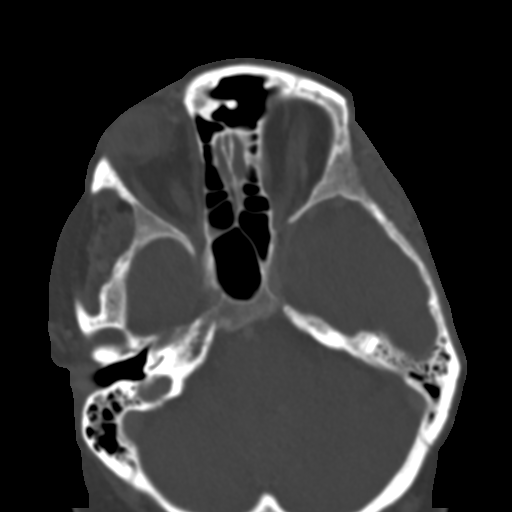
[im 76/82  brain]
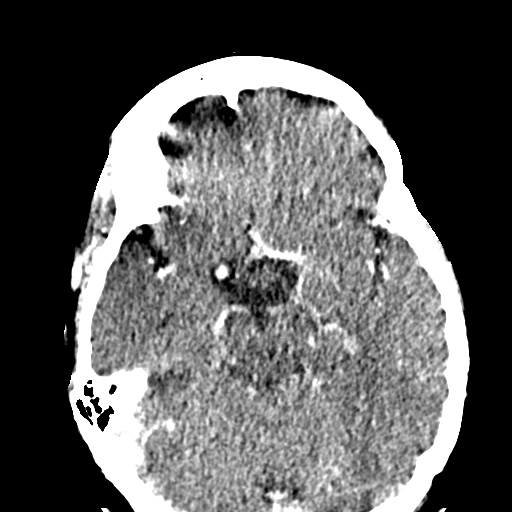
[im 76/82  bone]
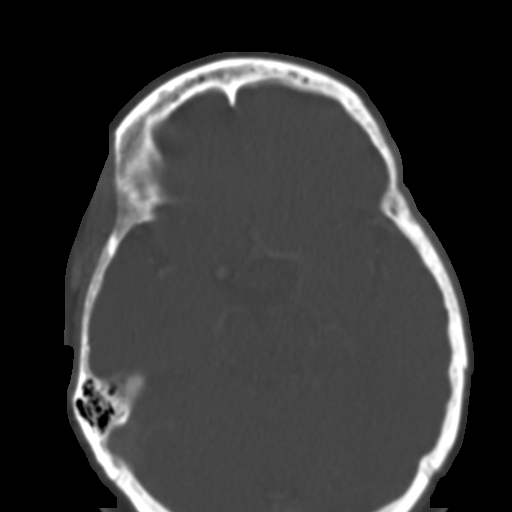

[Series 4: coronal soft · coronal · 0.40mm/px · 3 of 75 slices shown]
[im 25/75  bone]
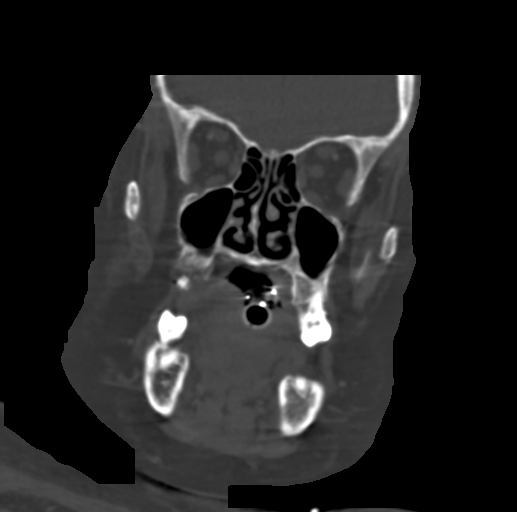
[im 33/75  bone]
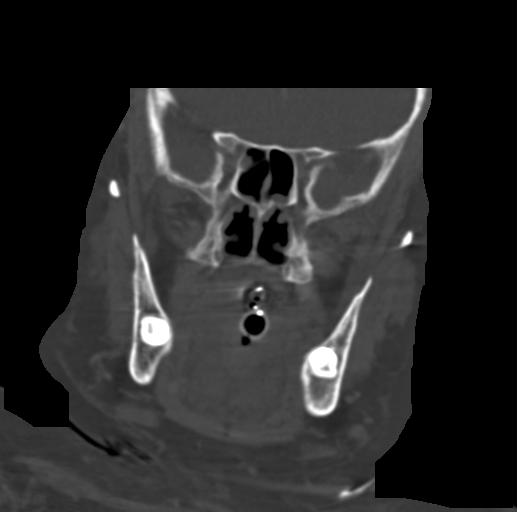
[im 42/75  bone]
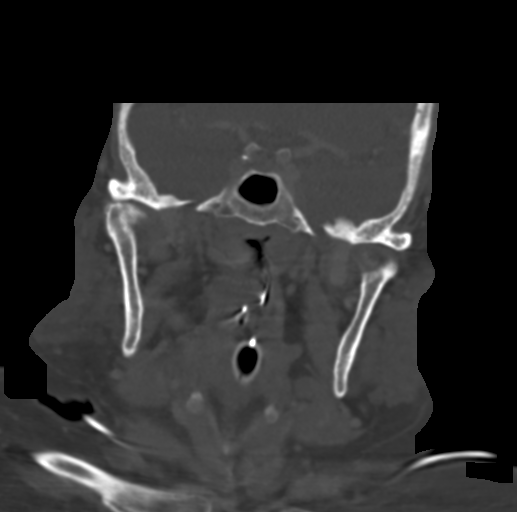

[Series 5: sagittal soft · sagittal · 0.36mm/px · 3 of 90 slices shown]
[im 30/90  bone]
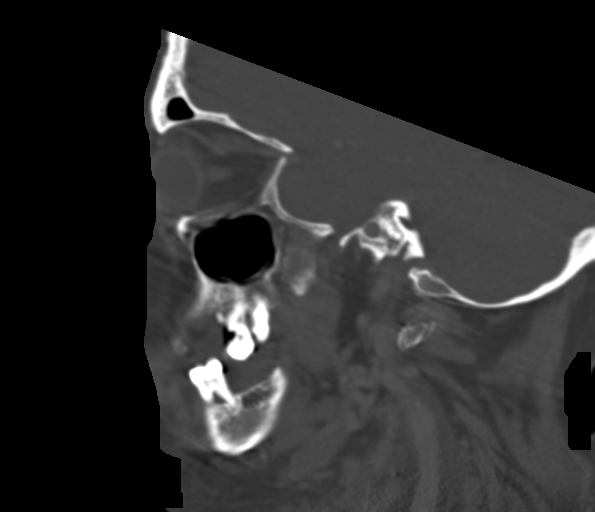
[im 45/90  bone]
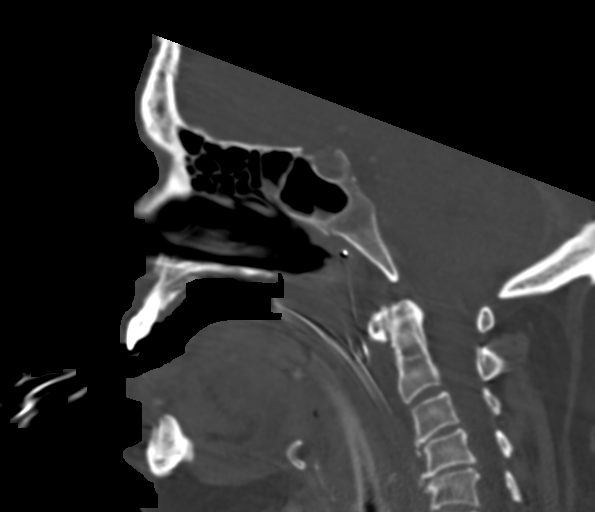
[im 60/90  bone]
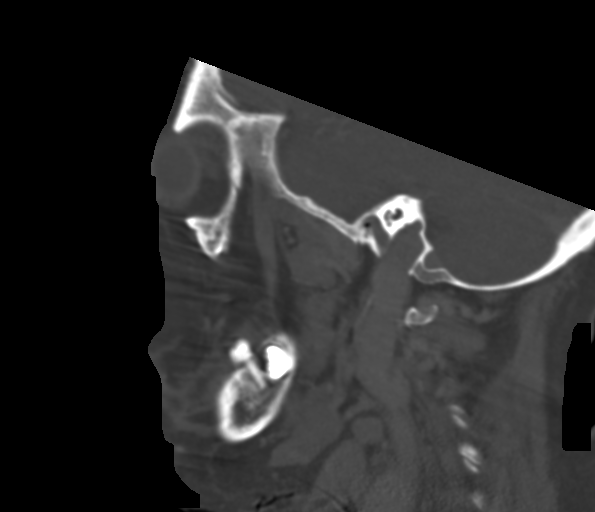

[15 of 47 positions shown; findings below may reference images not displayed]

FINDINGS: Osseous: No focal dental or periodontal disease is present other
than a large dental caries of the residual right maxillary molar. No
disease is present about the roots. No focal inflammation or abscess
is present. No focal osseous lesions are present. Mandible is intact
and located. The upper cervical spine is within normal limits.

Orbits: The globes and orbits are within normal limits.

Sinuses: The paranasal sinuses and mastoid air cells are clear.

Soft tissues: Patient is intubated. Orogastric tube is in place. No
focal inflammatory changes are present about the mandible or
maxilla. Minimal inflammatory changes are present about the lateral
right orbit.

Limited intracranial: Within normal limits.
IMPRESSION: 1. Prominent dental caries within the residual right maxillary
molar. No associated inflammation.
2. No other focal dental or periodontal disease to suggest
infection.
3. Minimal inflammatory changes about the lateral right orbit. No
abscess.

## 2021-01-29 IMAGING — US US ABDOMEN LIMITED
1 series · 8 of 8 positions shown · non-contrast
Comparison: [DATE]

CLINICAL DATA: Ascites

EXAM:
LIMITED ABDOMEN ULTRASOUND FOR ASCITES
TECHNIQUE: Limited ultrasound survey for ascites was performed in all four
abdominal quadrants.

[Series 1: us abdomen limited · 8 of 8 slices shown]
[im 1/8]
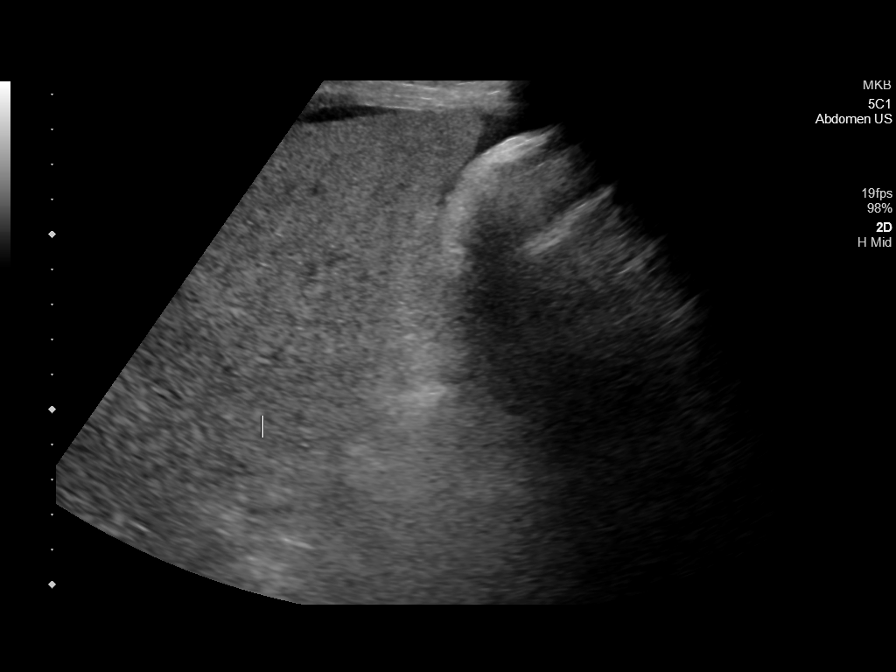
[im 2/8]
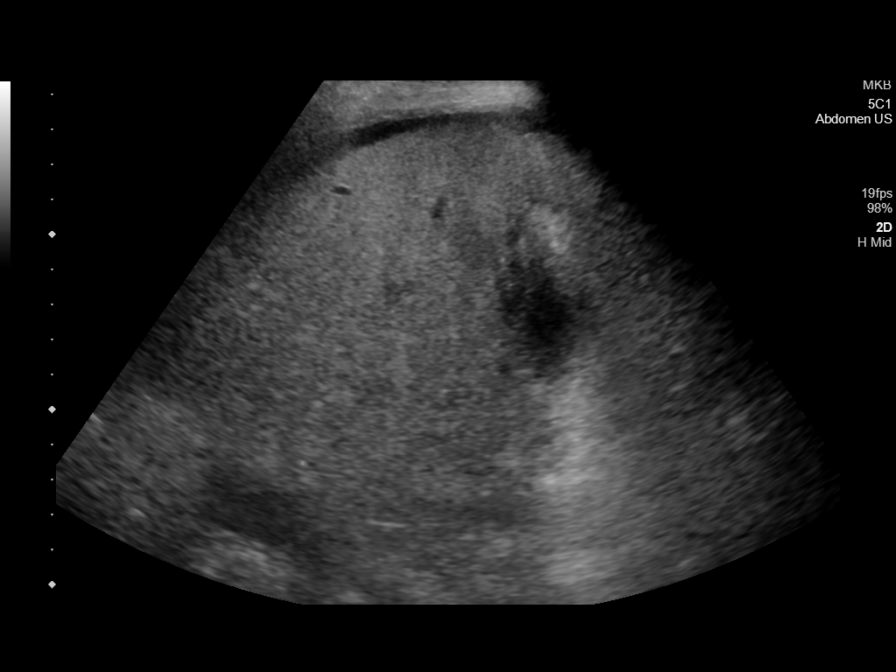
[im 3/8]
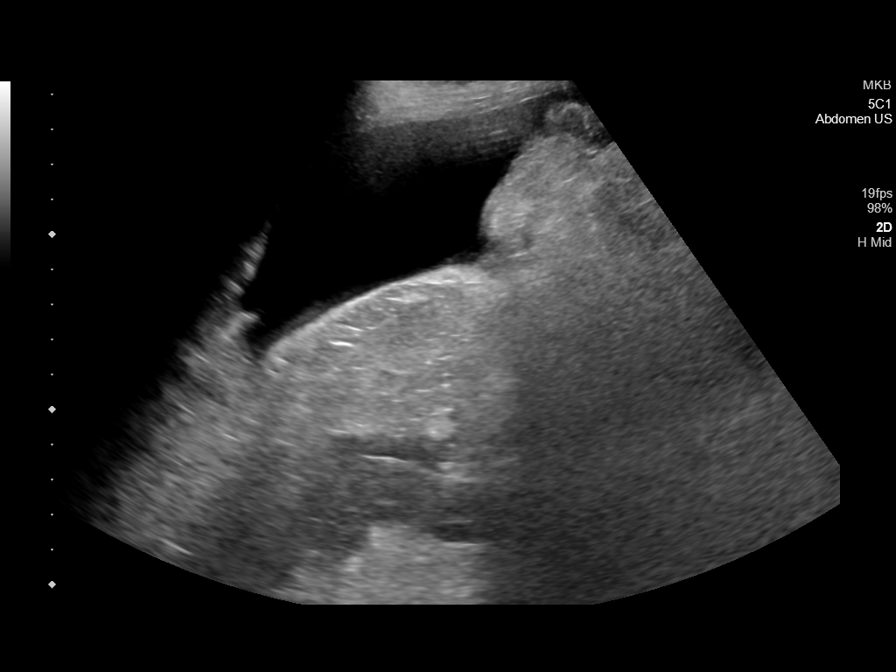
[im 4/8]
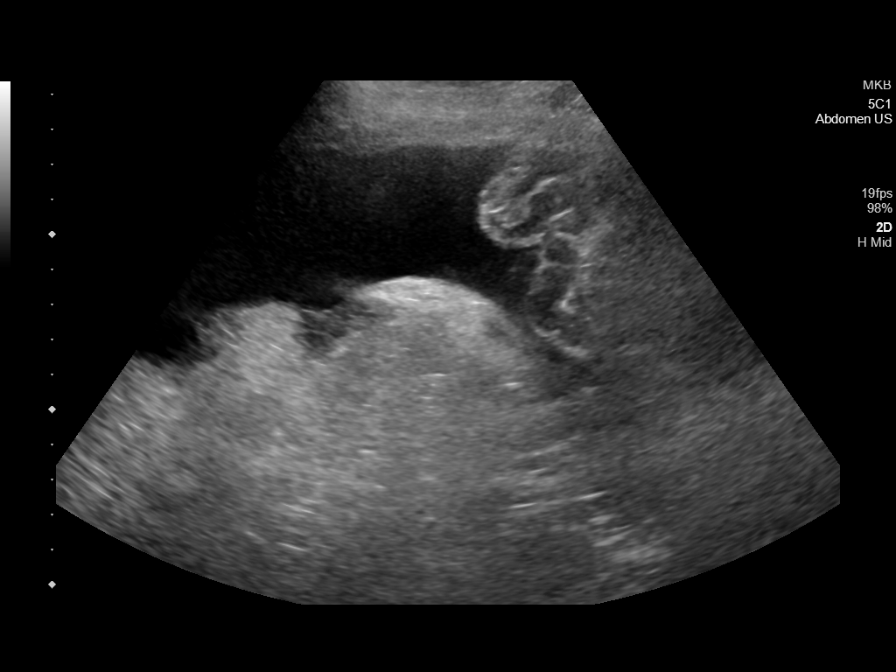
[im 5/8]
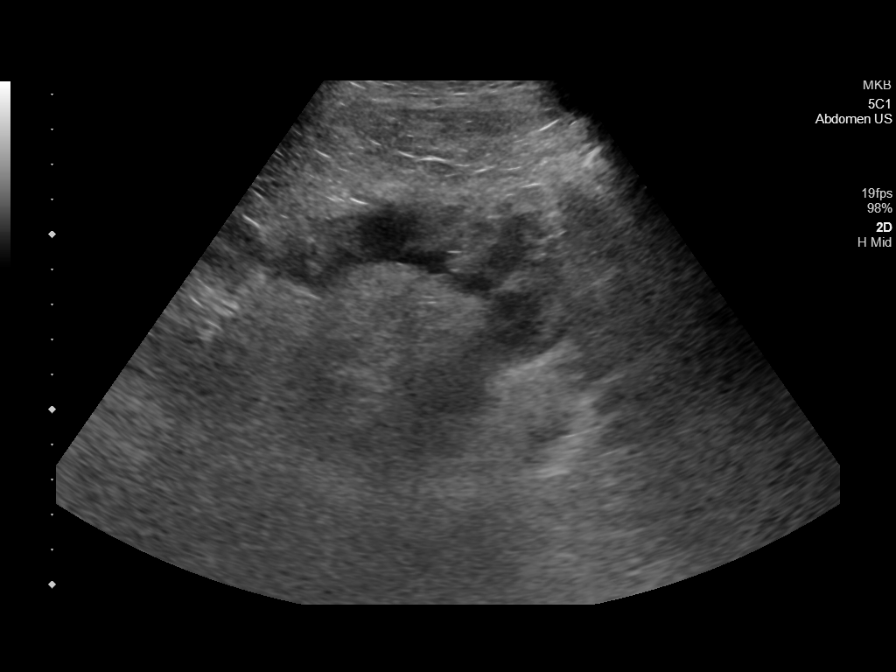
[im 6/8]
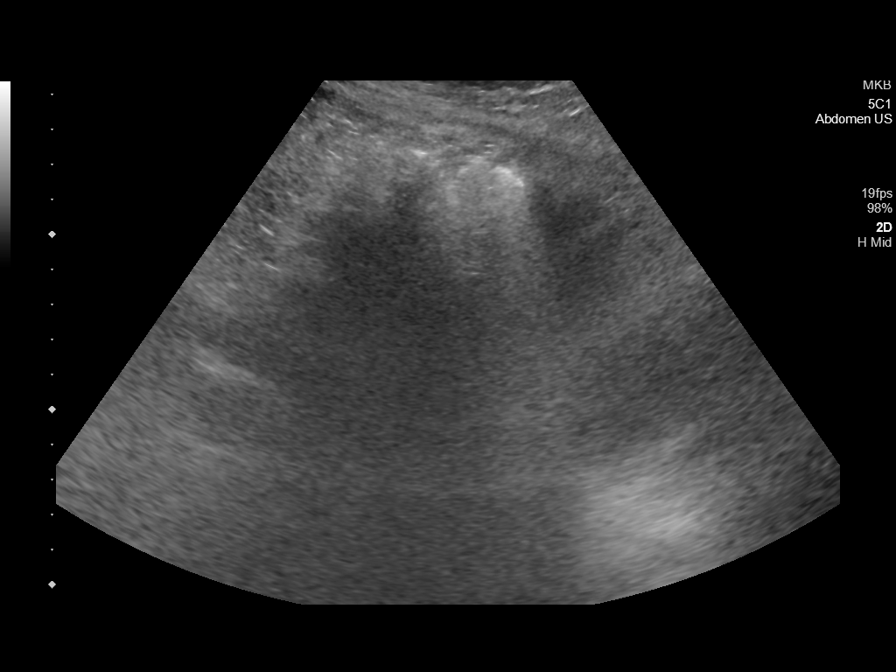
[im 7/8]
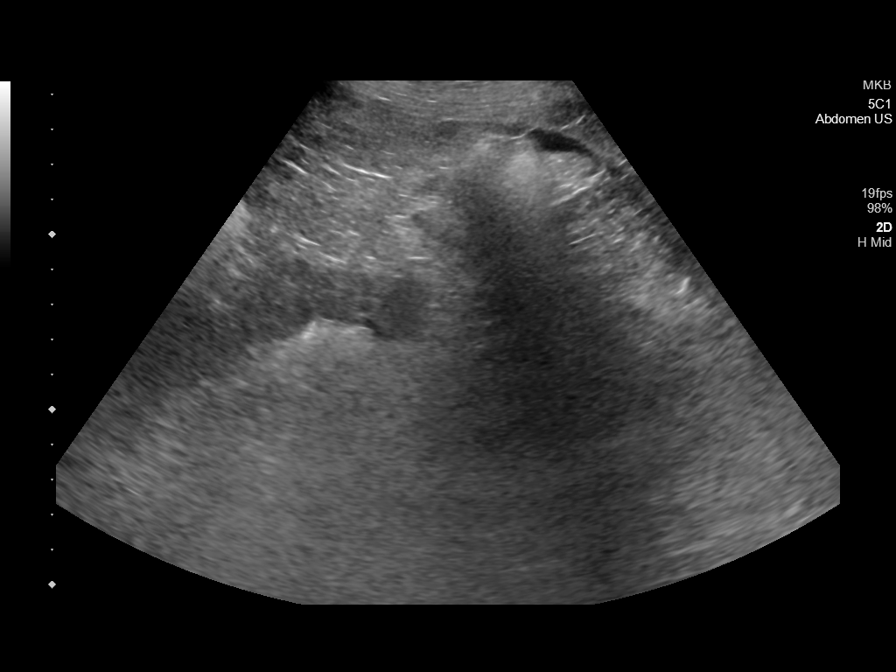
[im 8/8]
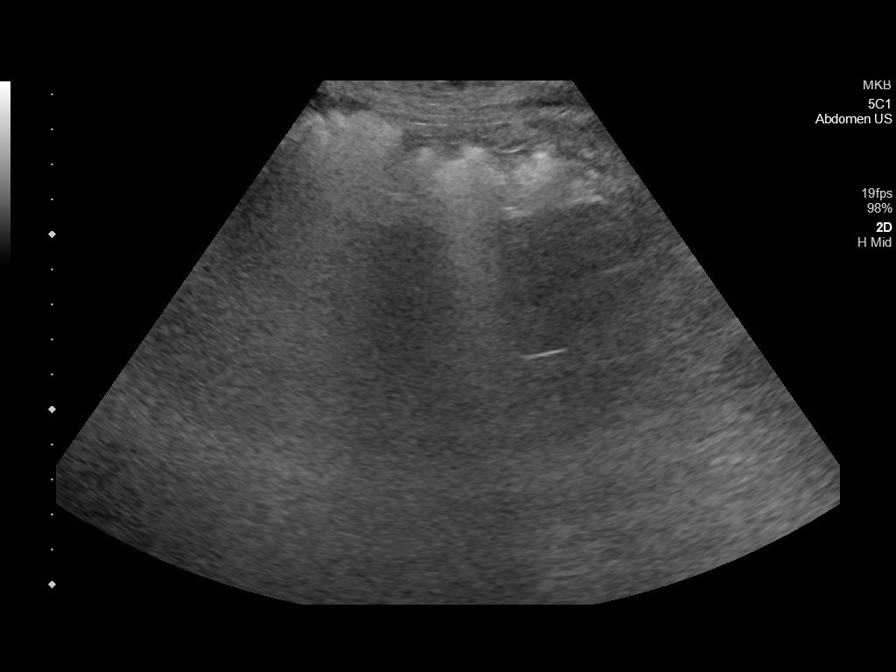

[8 of 8 positions shown; findings below may reference images not displayed]

FINDINGS: There is interval increase in size of small ascites in right upper
and right lower quadrants of abdomen.
IMPRESSION: There is interval increase in size of small ascites in right side of
abdomen.

Reading location: [place], [place].

## 2021-01-29 IMAGING — CT CT HEAD W/O CM
3 series · 16 of 47 positions shown, 19 images · non-contrast
Comparison: Head CT [DATE]

CLINICAL DATA: Hyperammonemia.  Encephalopathy.

EXAM:
CT HEAD WITHOUT CONTRAST
TECHNIQUE: Contiguous axial images were obtained from the base of the skull
through the vertex without intravenous contrast.

[Series 3: head wo · axial · 0.44mm/px · z∈[+253,+378]mm · 10 of 30 slices shown, 13 images]
[im 3/30  brain]
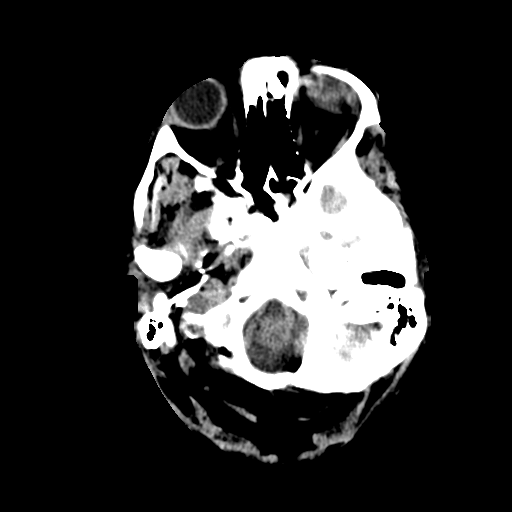
[im 3/30  bone]
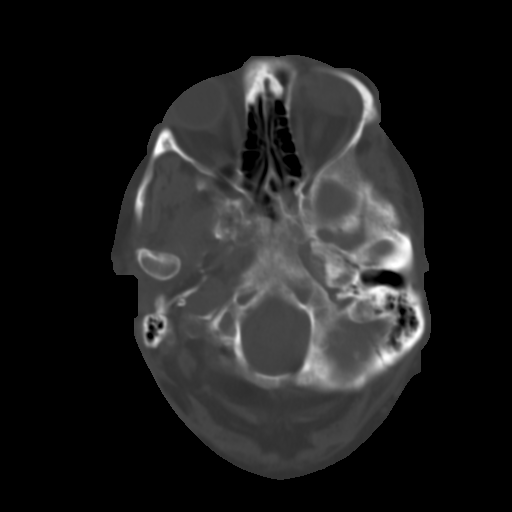
[im 6/30  brain]
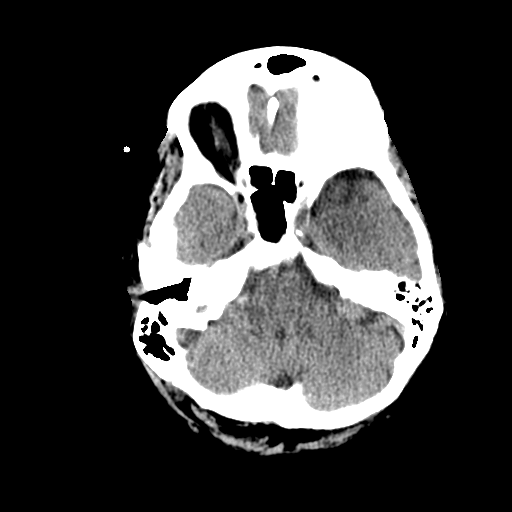
[im 9/30  brain]
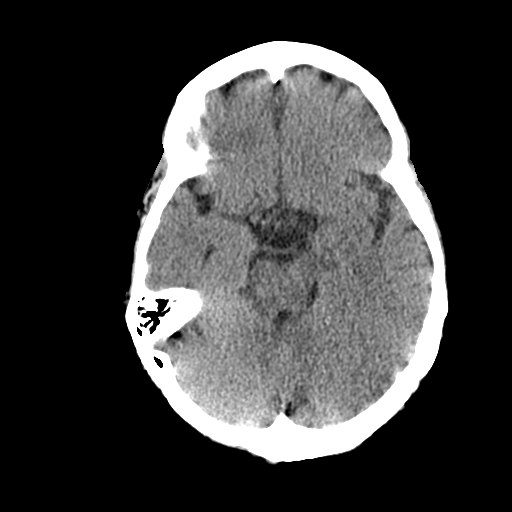
[im 11/30  brain]
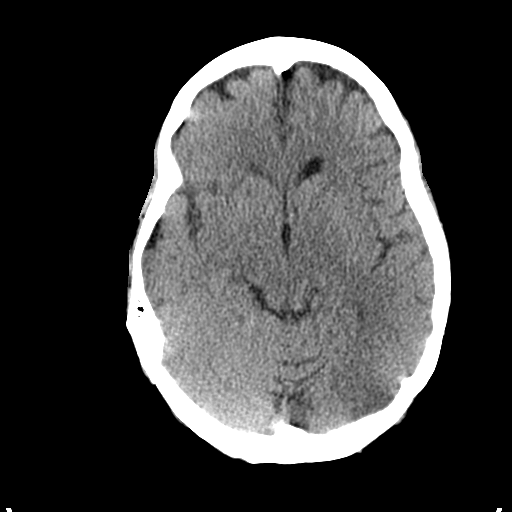
[im 14/30  brain]
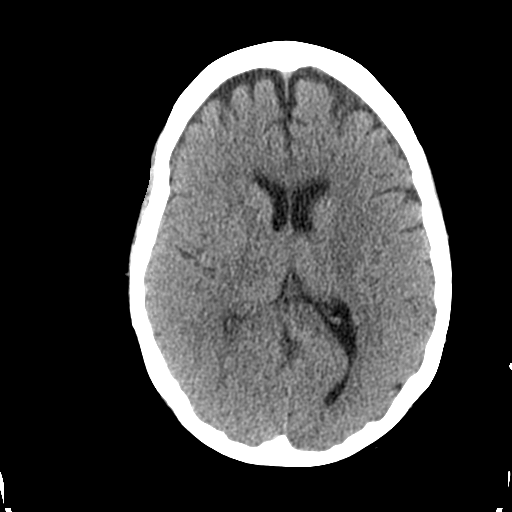
[im 14/30  bone]
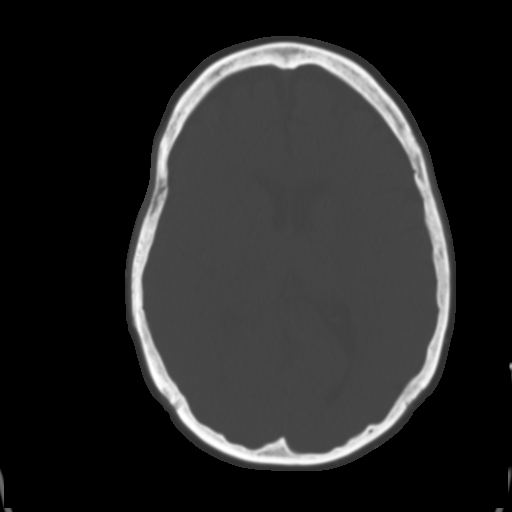
[im 17/30  brain]
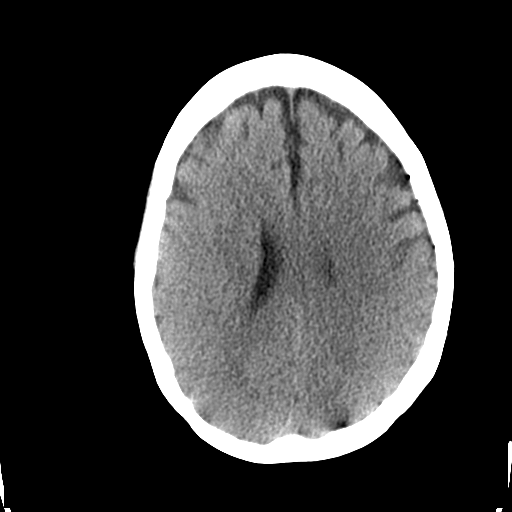
[im 20/30  brain]
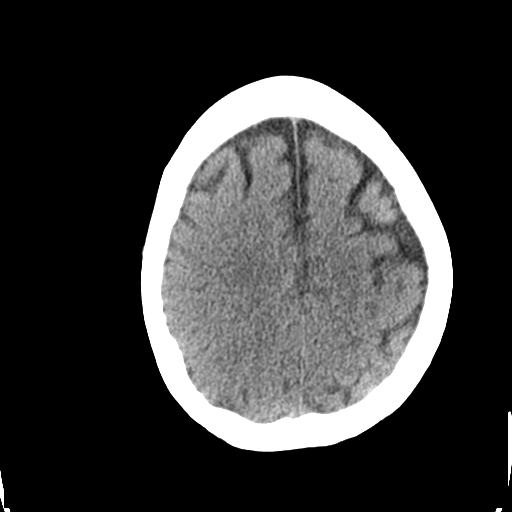
[im 23/30  brain]
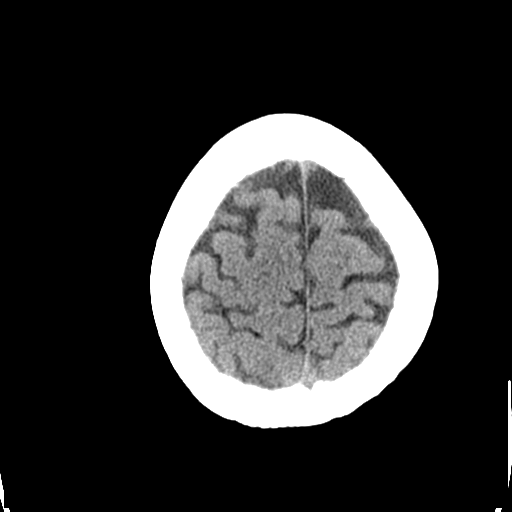
[im 25/30  brain]
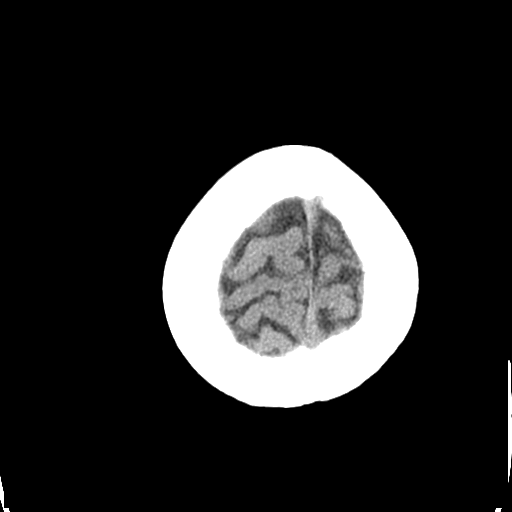
[im 25/30  bone]
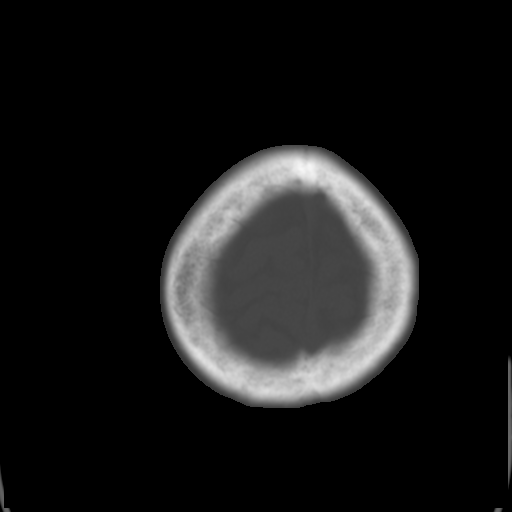
[im 28/30  brain]
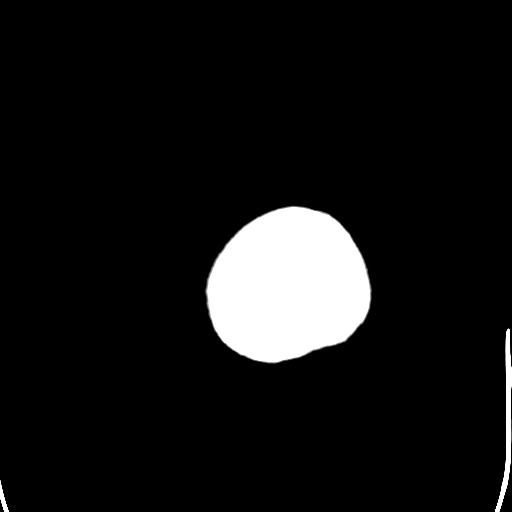

[Series 4: coronal soft tissue · coronal · 0.33mm/px · 3 of 66 slices shown]
[im 22/66  brain]
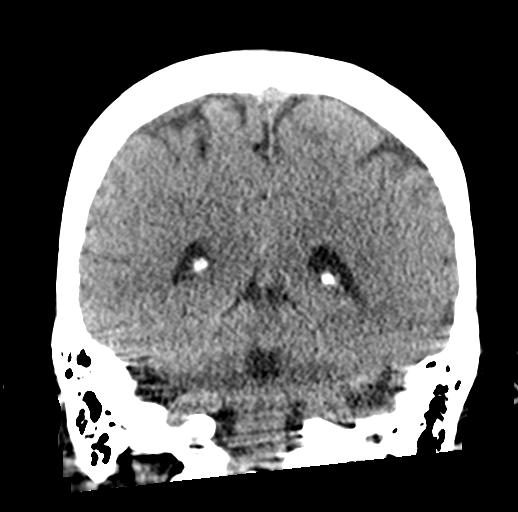
[im 29/66  brain]
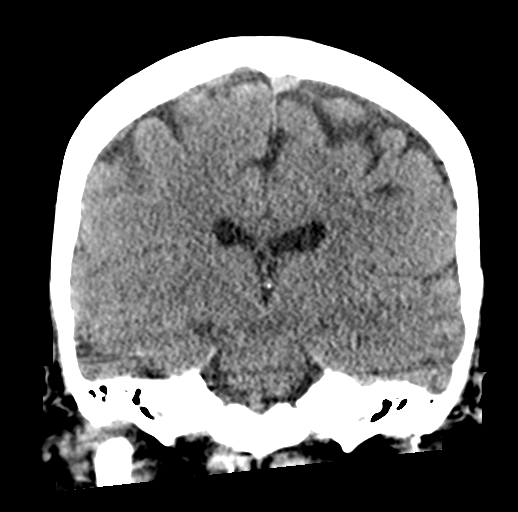
[im 37/66  brain]
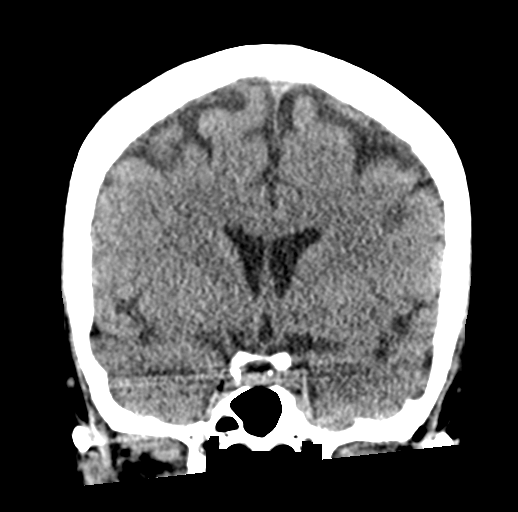

[Series 5: sagittal soft tissue · sagittal · 0.33mm/px · 3 of 53 slices shown]
[im 18/53  brain]
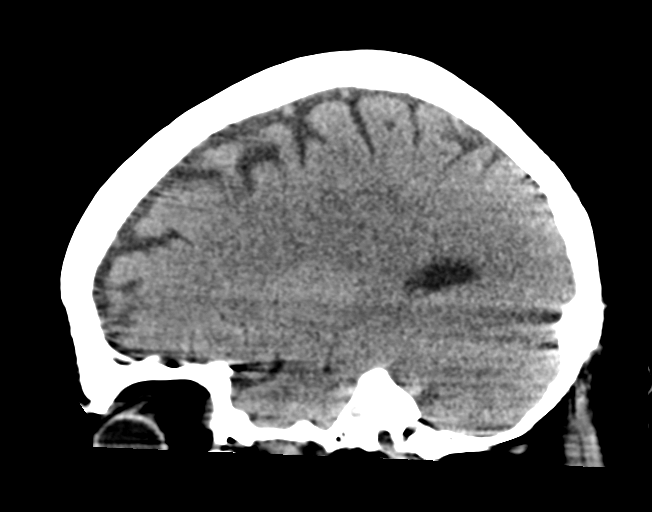
[im 27/53  brain]
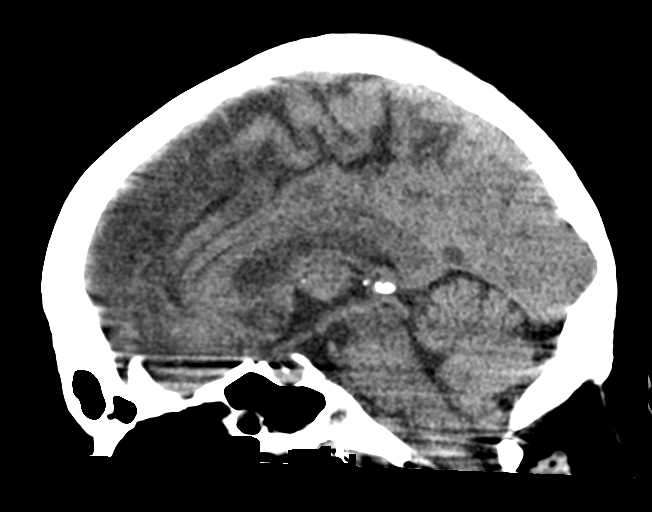
[im 35/53  brain]
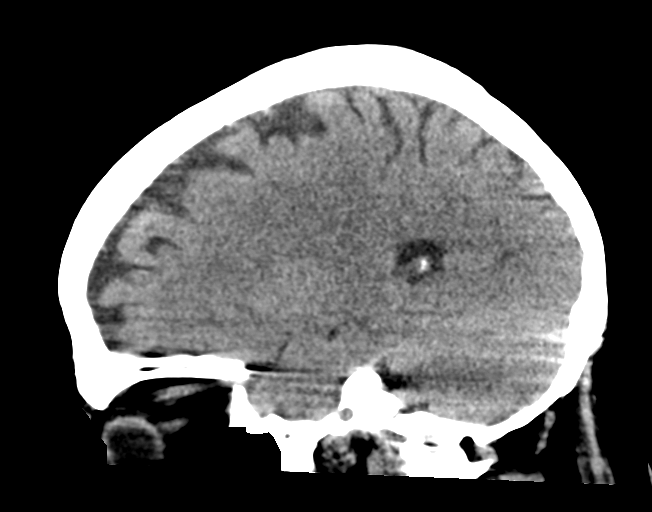

[16 of 47 positions shown; findings below may reference images not displayed]

FINDINGS: Brain: There is motion artifact which limits detailed assessment.
Allowing for this, no intracranial bleed or subdural collection.
Stable brain volume. No hydrocephalus. No evidence of acute
ischemia. No midline shift.

Vascular: Atherosclerosis of skullbase vasculature without
hyperdense vessel or abnormal calcification.

Skull: No fracture or focal lesion.

Sinuses/Orbits: Assessed on concurrent face CT, reported separately.

Other: None.
IMPRESSION: Motion limited exam. Allowing for this, no acute intracranial
abnormality.

## 2021-01-29 MED ORDER — FUROSEMIDE 10 MG/ML IJ SOLN
40.0000 mg | Freq: Once | INTRAMUSCULAR | Status: AC
  Administered 2021-01-29: 40 mg via INTRAVENOUS
  Filled 2021-01-29: qty 4

## 2021-01-29 MED ORDER — FENTANYL CITRATE PF 50 MCG/ML IJ SOSY
50.0000 ug | PREFILLED_SYRINGE | INTRAMUSCULAR | Status: DC | PRN
  Administered 2021-01-29: 50 ug via INTRAVENOUS
  Administered 2021-02-11 (×3): 100 ug via INTRAVENOUS
  Administered 2021-02-11: 50 ug via INTRAVENOUS
  Administered 2021-02-12 (×4): 100 ug via INTRAVENOUS
  Filled 2021-01-29 (×7): qty 2

## 2021-01-29 MED ORDER — FENTANYL CITRATE PF 50 MCG/ML IJ SOSY
50.0000 ug | PREFILLED_SYRINGE | INTRAMUSCULAR | Status: DC | PRN
  Administered 2021-02-11: 50 ug via INTRAVENOUS
  Filled 2021-01-29 (×2): qty 1

## 2021-01-29 MED ORDER — LACTULOSE 10 GM/15ML PO SOLN
30.0000 g | Freq: Two times a day (BID) | ORAL | Status: DC
  Administered 2021-01-29 – 2021-02-13 (×30): 30 g
  Filled 2021-01-29 (×30): qty 60

## 2021-01-29 MED ORDER — MIDODRINE HCL 5 MG PO TABS: 5.0000 mg | ORAL_TABLET | Freq: Three times a day (TID) | ORAL | Status: DC

## 2021-01-29 MED ORDER — BUDESONIDE 0.5 MG/2ML IN SUSP
0.5000 mg | Freq: Two times a day (BID) | RESPIRATORY_TRACT | Status: DC
  Administered 2021-01-29 – 2021-01-30 (×2): 0.5 mg via RESPIRATORY_TRACT
  Filled 2021-01-29 (×2): qty 2

## 2021-01-29 MED ORDER — IOHEXOL 300 MG/ML  SOLN
75.0000 mL | Freq: Once | INTRAMUSCULAR | Status: AC | PRN
  Administered 2021-01-29: 75 mL via INTRAVENOUS

## 2021-01-29 MED ORDER — ZINC SULFATE 220 (50 ZN) MG PO CAPS
220.0000 mg | ORAL_CAPSULE | Freq: Every day | ORAL | Status: DC
  Administered 2021-01-29 – 2021-02-13 (×16): 220 mg
  Filled 2021-01-29 (×16): qty 1

## 2021-01-29 MED ORDER — POLYETHYLENE GLYCOL 3350 17 G PO PACK
17.0000 g | PACK | Freq: Every day | ORAL | Status: DC
Start: 2021-01-29 — End: 2021-02-09
  Administered 2021-01-29 – 2021-02-07 (×5): 17 g
  Filled 2021-01-29 (×6): qty 1

## 2021-01-29 MED ORDER — POTASSIUM PHOSPHATES 15 MMOLE/5ML IV SOLN
30.0000 mmol | Freq: Once | INTRAVENOUS | Status: AC
  Administered 2021-01-29: 30 mmol via INTRAVENOUS
  Filled 2021-01-29: qty 10

## 2021-01-29 MED ORDER — MIDODRINE HCL 5 MG PO TABS
5.0000 mg | ORAL_TABLET | Freq: Three times a day (TID) | ORAL | Status: DC
  Administered 2021-01-29 – 2021-01-30 (×2): 5 mg
  Filled 2021-01-29 (×2): qty 1

## 2021-01-29 MED ORDER — FENTANYL CITRATE PF 50 MCG/ML IJ SOSY
50.0000 ug | PREFILLED_SYRINGE | INTRAMUSCULAR | Status: AC | PRN
  Administered 2021-01-29: 50 ug via INTRAVENOUS
  Filled 2021-01-29 (×2): qty 1

## 2021-01-29 MED ORDER — DEXMEDETOMIDINE HCL IN NACL 400 MCG/100ML IV SOLN
0.0000 ug/kg/h | INTRAVENOUS | Status: DC
  Administered 2021-01-29: 0.9 ug/kg/h via INTRAVENOUS
  Administered 2021-01-29: 0.8 ug/kg/h via INTRAVENOUS
  Administered 2021-01-30: 0.6 ug/kg/h via INTRAVENOUS
  Administered 2021-01-30: 0.7 ug/kg/h via INTRAVENOUS
  Administered 2021-01-31: 0.4 ug/kg/h via INTRAVENOUS
  Administered 2021-01-31: 0.402 ug/kg/h via INTRAVENOUS
  Administered 2021-02-01: 0.4 ug/kg/h via INTRAVENOUS
  Filled 2021-01-29 (×7): qty 100

## 2021-01-29 MED ORDER — DOCUSATE SODIUM 50 MG/5ML PO LIQD
100.0000 mg | Freq: Two times a day (BID) | ORAL | Status: DC
  Administered 2021-01-29 – 2021-02-13 (×14): 100 mg
  Filled 2021-01-29 (×16): qty 10

## 2021-01-29 MED ORDER — MAGNESIUM SULFATE 2 GM/50ML IV SOLN
2.0000 g | Freq: Once | INTRAVENOUS | Status: AC
  Administered 2021-01-29: 2 g via INTRAVENOUS
  Filled 2021-01-29: qty 50

## 2021-01-29 NOTE — Progress Notes (Signed)
eLink Physician-Brief Progress Note Patient Name: [name] Carrillo DOB: [birth year] MRN: [id number]   Date of Service  01/29/2021  HPI/Events of Note  Patient with sub-optimal sedation and "guppy breathing" on the ventilator She is on 0.9 mcg of Precedex gtt.Marland Kitchen  eICU Interventions  Fentanyl 50 mcg iv x 1 ordered, bedside RN advised  to increase the gtt rate of Precedex gradually, as tolerated.        [name] Carrillo 01/29/2021, 3:22 AM

## 2021-01-29 NOTE — Progress Notes (Addendum)
NAME:  [name] Carrillo, MRN:  [id number], DOB:  11-Mar-1966, LOS: 9 ADMISSION DATE:  01/20/2021, CONSULTATION DATE:  01/24/21 REFERRING MD:  Priscella Mann, CHIEF COMPLAINT:  unresponsive   History of Present Illness:  55 yo F wthi hx of MDD, Anxiety disorder, osteomyelitis of left tibia, left tibial fracture history, recurrent UTIs, EtOH abuse and liver Cirrhosis ([place]), active alcoholism, tobacco abuse with multiple readmissions to hospital with EtOH withdrawal syndrome and now being brought into MICU due to unstable vital signs and aggitated hyperactive delerium with concerns for delerium tremens. Blood work is notable for transaminitis with decreased synthetic function.     Patient had ziplock bag with two teeth in there due to loose teeth in mouth. Husband shares that patient has few false teeth that she had created herself.    Ive met with husband and son and explained that patient is unresponsive with GCS7 and is not able to protect airway. They understand the severity of situation and agree for intubation if needed. We discussed goals of care and patient is full code.   Pertinent  Medical History  MDD Anxiety Alcohol use disorder Alcoholic cirrhosis Smoking  Significant Hospital Events: Including procedures, antibiotic start and stop dates in addition to other pertinent events   10/19 Pt admitted to the [facility] unit with delirium tremors secondary to ETOH withdrawal  10/19: CT Head negative  10/19: CT Abd/Pelvis revealed Hepatomegaly with heterogeneous liver enhancement and evidence of portal venous hypertension including recanalized paraumbilical vein, small spleno-renal varices, and small volume ascites. Chronic liver disease strongly suspected although the enhancement pattern might indicate a superimposed acute hepatitis. No evidence of bile duct obstruction. No discrete liver lesion. No obstructive uropathy but absent renal contrast excretion on the delayed images suggesting acute renal  insufficiency. Punctate nephrolithiasis. Borderline to mild cardiomegaly. Bilateral lower lobe atelectasis. Mild aortic atherosclerosis.  10/24 Transfer to ICU, intubation, CVL placed 10/28: Pt with hepatic encephalopathy currently undergoing SBT 10/5.  14L positive will administer 40 mg iv lasix x1 dose   Interim History / Subjective:  Per EICU physician progress note overnight pt "guppy breathing"  and inadequately sedated requiring 50 mcg fentanyl bolus x1 dose and precedex gtt dose increased   Objective   Blood pressure (!) 117/57, pulse 72, temperature 98.8 F (37.1 C), resp. rate (!) 21, height '5\' 5"'  (1.651 m), weight 93.7 kg, last menstrual period 09/07/2015, SpO2 93 %.    Vent Mode: PRVC FiO2 (%):  [40 %-50 %] 50 % Set Rate:  [16 bmp] 16 bmp Vt Set:  [400 mL] 400 mL PEEP:  [5 cmH20] 5 cmH20 Plateau Pressure:  [14 cmH20-17 cmH20] 17 cmH20   Intake/Output Summary (Last 24 hours) at 01/29/2021 0729 Last data filed at 01/29/2021 0700 Gross per 24 hour  Intake 3371.69 ml  Output 1455 ml  Net 1916.69 ml   Filed Weights   01/19/21 1338 01/24/21 1549 01/29/21 0410  Weight: 81.6 kg 79.4 kg 93.7 kg    Examination: General: acutely ill appearing female, NAD mechanically intubated  HENT: supple, no JVD  Lungs: diffuse rhonchi with crackles throughout, even, non labored Cardiovascular: sinus rhythm, no R/G, 2+ radial/1+ distal pulses, 1+ generalized edema  Abdomen: +BS x4, soft, obese, abdominal distension present  Extremities: normal bulk; LLE cool to touch  Neuro: sedated, not following commands, intermittent agitation withdraws from painful stimulation, PERRL, icteric sclera  GU: indwelling foley catheter draining dark yellow urine    Resolved Hospital Problem list   Hypernatremia   Assessment &  Plan:   Acute toxic /metabolic encephalopathy due to hepatic encephalopathy~worsening ammonia 10/27: 132  Moderate-severe alcohol withdrawal with delirium Continue Precedex gtt   Continue Valium via G-tube Continue as needed Versed Monitor CIWA protocol Continue thiamine, folate and multivitamins Continue lactulose 30 g per tube daily and will add rifaximin  Repeat ammonia pending 10/29  UTI due to E. coli/Klebsiella Aspiration pneumonia Continue ceftriaxone to complete 5 days therapy Worsening leukocytosis CXR 10/27 concerning for infiltrates vs. edema~will check PCT and obtain tracheal aspirate; if pct worsening will broaden abx therapy   Acute hypoxic respiratory failure Aspiration pneumonia Full vent support for now  SAT/SBT as tolerated VAP bundle implemented Intermittent CXR and ABG   Acute alcoholic hepatitis Monitor hepatic function panel  US abdomen pending to assess for ascites   Hypophosphatemia  Oliguric Acute kidney injury - ?HRS  Pt 14L positive will give 40 mg iv lasix x1 dose  Monitor BMP Replace electrolytes as indicated  Monitor UOP  Severe protein calorie malnutrition Dietitian consulted appreciate input~continue tube feeds  Macrocytic anemia due to folate deficiency Thrombocytopenia Likely driven by liver disease/alcoholism Continue folic acid supplementation Trend CBC Monitor for s/sx of bleeding   Best Practice (right click and "Reselect all SmartList Selections" daily)   Diet/type: tubefeeds DVT prophylaxis: SCD; subq heparin  GI prophylaxis: PPI Lines: Central line Foley:  Yes and still indicated  Code Status:  full code Last date of multidisciplinary goals of care discussion [01/30/2021]  Critical care time: 30 minutes    Rosilyn Mings, [place] Pager [phone] (please enter 7 digits) PCCM Consult Pager [phone] (please enter 7 digits)

## 2021-01-29 NOTE — Progress Notes (Signed)
[facility] ADULT ICU REPLACEMENT PROTOCOL   The patient does apply for the [facility] Adult ICU Electrolyte Replacment Protocol based on the criteria listed below:   1.Exclusion criteria: TCTS patients, ECMO patients, and Dialysis patients 2. Is GFR >/= 30 ml/min? Yes.    Patient's GFR today is >60 3. Is SCr </= 2? Yes.   Patient's SCr is 0.79 mg/dL 4. Did SCr increase >/= 0.5 in 24 hours? No. 5.Pt's weight >40kg  Yes.   6. Abnormal electrolyte(s): Phos 1.9, Mg 1.9  7. Electrolytes replaced per protocol   Ardelle Park 01/29/2021 5:48 AM

## 2021-01-29 NOTE — Plan of Care (Signed)
Neuro: titrating Precedex to RASS, responds to pain, does not attempt to open eyes-significant eye edema Resp: stable on ventilator, significant ET (blood tinged and frothy) and oral secretions (yellow, thick)  CV: TMAX 38 with improvement following environmental changes, vital signs stable, generalized edema GIGU: foley and flexiseal in place, lasix given x 1, tolerating tube feeds well Skin:scattered petechiae and bruising, old surgical sites, otherwise intact Social: Husband visited for a short time this morning, all questions and concerns addressed, no further contact  Events: Travelled to CT for eval, with no complications  Problem: Education: Goal: Knowledge of General Education information will improve Description: Including pain rating scale, medication(s)/side effects and non-pharmacologic comfort measures Outcome: Not Progressing   Problem: Health Behavior/Discharge Planning: Goal: Ability to manage health-related needs will improve Outcome: Not Progressing   Problem: Clinical Measurements: Goal: Ability to maintain clinical measurements within normal limits will improve Outcome: Not Progressing Goal: Will remain free from infection Outcome: Not Progressing Goal: Diagnostic test results will improve Outcome: Not Progressing Goal: Respiratory complications will improve Outcome: Not Progressing Goal: Cardiovascular complication will be avoided Outcome: Not Progressing   Problem: Activity: Goal: Risk for activity intolerance will decrease Outcome: Not Progressing   Problem: Nutrition: Goal: Adequate nutrition will be maintained Outcome: Not Progressing   Problem: Coping: Goal: Level of anxiety will decrease Outcome: Not Progressing   Problem: Elimination: Goal: Will not experience complications related to bowel motility Outcome: Not Progressing Goal: Will not experience complications related to urinary retention Outcome: Not Progressing   Problem: Pain  Managment: Goal: General experience of comfort will improve Outcome: Not Progressing   Problem: Safety: Goal: Ability to remain free from injury will improve Outcome: Not Progressing   Problem: Skin Integrity: Goal: Risk for impaired skin integrity will decrease Outcome: Not Progressing   Problem: Respiratory: Goal: Ability to maintain a clear airway and adequate ventilation will improve Outcome: Not Progressing

## 2021-01-30 ENCOUNTER — Inpatient Hospital Stay: Payer: BC Managed Care – PPO

## 2021-01-30 DIAGNOSIS — G9341 Metabolic encephalopathy: Secondary | ICD-10-CM | POA: Diagnosis not present

## 2021-01-30 LAB — GLUCOSE, CAPILLARY
Glucose-Capillary: 176 mg/dL — ABNORMAL HIGH (ref 70–99)
Glucose-Capillary: 181 mg/dL — ABNORMAL HIGH (ref 70–99)
Glucose-Capillary: 183 mg/dL — ABNORMAL HIGH (ref 70–99)
Glucose-Capillary: 187 mg/dL — ABNORMAL HIGH (ref 70–99)
Glucose-Capillary: 193 mg/dL — ABNORMAL HIGH (ref 70–99)
Glucose-Capillary: 205 mg/dL — ABNORMAL HIGH (ref 70–99)

## 2021-01-30 LAB — CBC
HCT: 29.1 % — ABNORMAL LOW (ref 36.0–46.0)
Hemoglobin: 9.1 g/dL — ABNORMAL LOW (ref 12.0–15.0)
MCH: 37.4 pg — ABNORMAL HIGH (ref 26.0–34.0)
MCHC: 31.3 g/dL (ref 30.0–36.0)
MCV: 119.8 fL — ABNORMAL HIGH (ref 80.0–100.0)
Platelets: 156 10*3/uL (ref 150–400)
RBC: 2.43 MIL/uL — ABNORMAL LOW (ref 3.87–5.11)
RDW: 25.3 % — ABNORMAL HIGH (ref 11.5–15.5)
WBC: 20.7 10*3/uL — ABNORMAL HIGH (ref 4.0–10.5)
nRBC: 0.2 % (ref 0.0–0.2)

## 2021-01-30 LAB — BASIC METABOLIC PANEL
Anion gap: 7 (ref 5–15)
BUN: 44 mg/dL — ABNORMAL HIGH (ref 6–20)
CO2: 31 mmol/L (ref 22–32)
Calcium: 8.7 mg/dL — ABNORMAL LOW (ref 8.9–10.3)
Chloride: 106 mmol/L (ref 98–111)
Creatinine, Ser: 0.7 mg/dL (ref 0.44–1.00)
GFR, Estimated: >60 mL/min (ref >60)
Glucose, Bld: 209 mg/dL — ABNORMAL HIGH (ref 70–99)
Potassium: 3.7 mmol/L (ref 3.5–5.1)
Sodium: 144 mmol/L (ref 135–145)

## 2021-01-30 LAB — PHOSPHORUS: Phosphorus: 2.5 mg/dL (ref 2.5–4.6)

## 2021-01-30 LAB — HEPATIC FUNCTION PANEL
ALT: 69 U/L — ABNORMAL HIGH (ref 0–44)
AST: 209 U/L — ABNORMAL HIGH (ref 15–41)
Albumin: 2.6 g/dL — ABNORMAL LOW (ref 3.5–5.0)
Alkaline Phosphatase: 128 U/L — ABNORMAL HIGH (ref 38–126)
Bilirubin, Direct: 4.5 mg/dL — ABNORMAL HIGH (ref 0.0–0.2)
Indirect Bilirubin: 3 mg/dL — ABNORMAL HIGH (ref 0.3–0.9)
Total Bilirubin: 7.5 mg/dL — ABNORMAL HIGH (ref 0.3–1.2)
Total Protein: 6.8 g/dL (ref 6.5–8.1)

## 2021-01-30 LAB — AMMONIA: Ammonia: 85 umol/L — ABNORMAL HIGH (ref 9–35)

## 2021-01-30 LAB — MAGNESIUM: Magnesium: 2 mg/dL (ref 1.7–2.4)

## 2021-01-30 IMAGING — DX DG CHEST 1V PORT
1 series · 1 of 1 positions shown · non-contrast
Comparison: Two days prior

CLINICAL DATA: Acute respiratory failure

EXAM:
PORTABLE CHEST 1 VIEW

[chest ap]
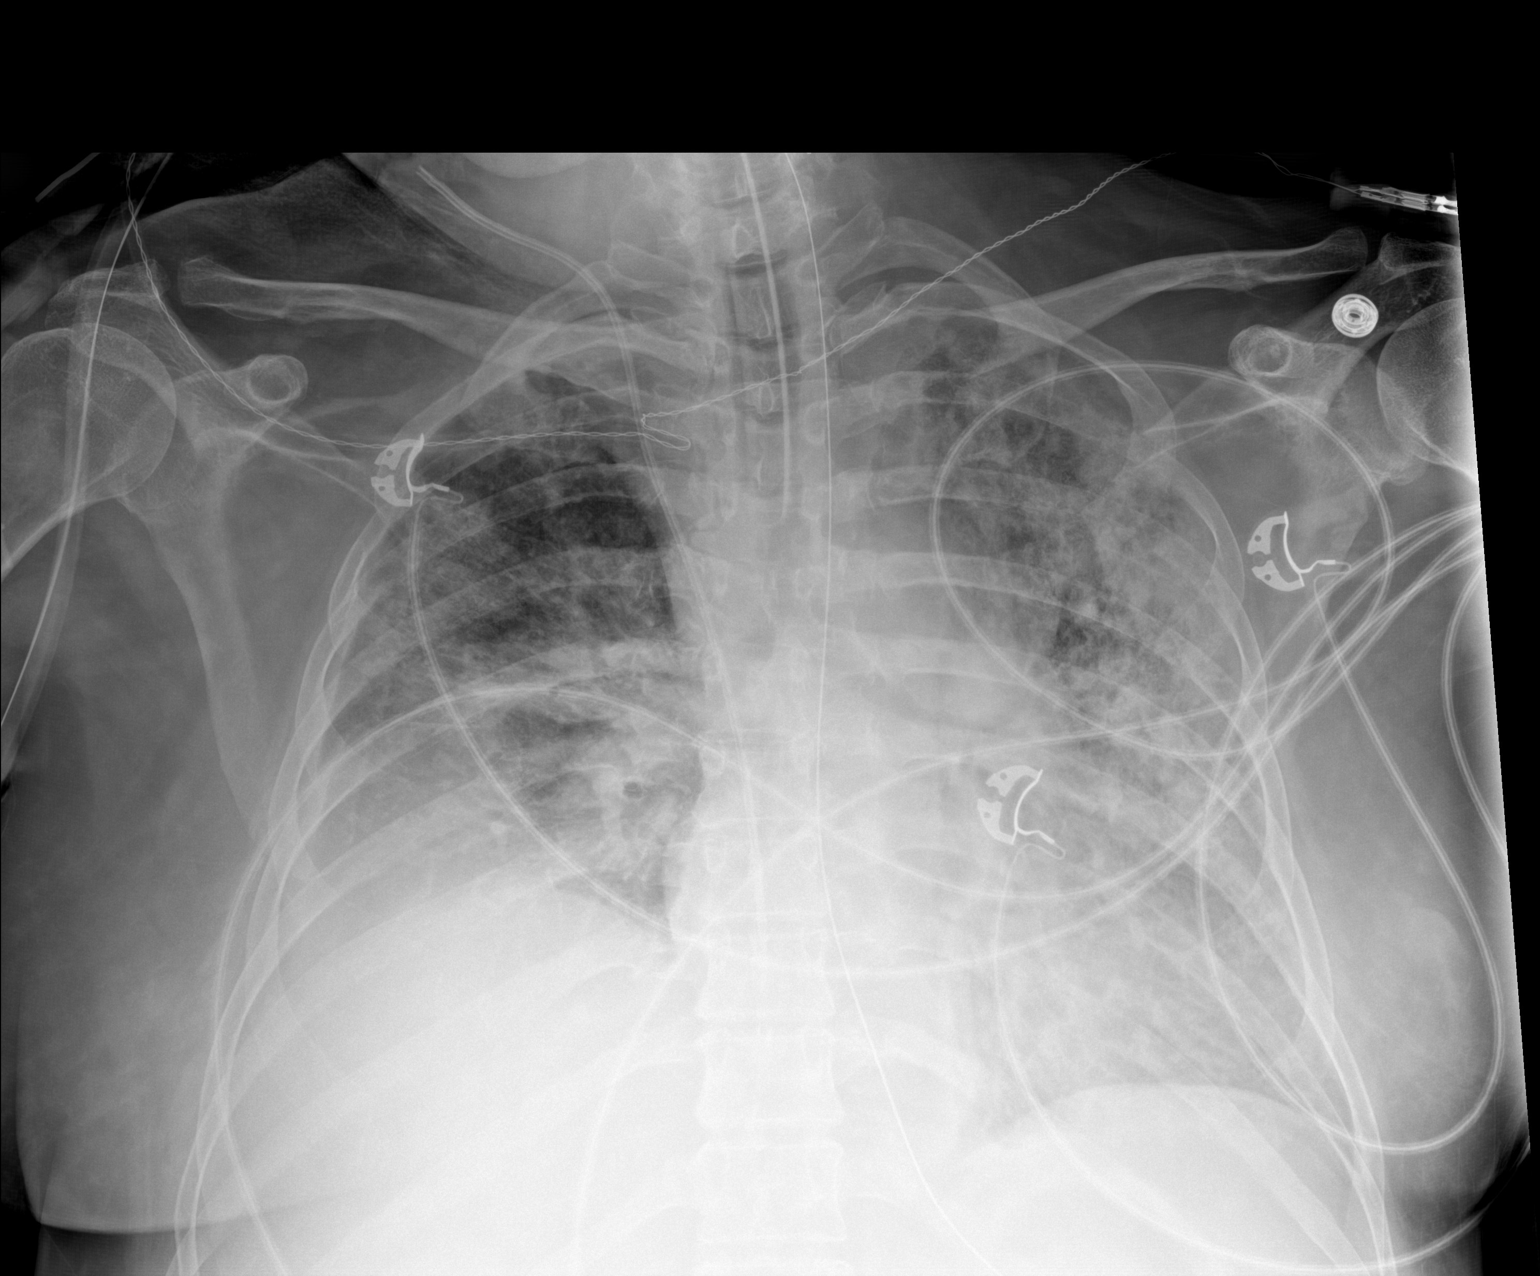

[1 of 1 positions shown; findings below may reference images not displayed]

FINDINGS: Stable positioning of the endotracheal tube terminating in the
midthoracic trachea. There is an enteric tube which courses below
the level of the diaphragm with the distal tip not visualized.
Esophageal temperature probe is present. There is a right-sided
approach central venous catheter with tip terminating the right
atrium. Stable appearance of the cardiomediastinal silhouette with
silhouetting of the left heart border. Probable small right pleural
effusion. Interval worsening of bilateral patchy pulmonary
opacities, particularly on the left side. No acute osseous
abnormality.
IMPRESSION: 1. Stable positioning of the support lines and tubes as described.
2. Worsening of bilateral diffuse patchy airspace opacities,
particularly on the left side. Probable small right pleural
effusion.

## 2021-01-30 MED ORDER — MIDODRINE HCL 5 MG PO TABS
10.0000 mg | ORAL_TABLET | Freq: Three times a day (TID) | ORAL | Status: DC
  Administered 2021-01-30 – 2021-02-05 (×19): 10 mg
  Filled 2021-01-30 (×20): qty 2

## 2021-01-30 MED ORDER — PIPERACILLIN-TAZOBACTAM 3.375 G IVPB
3.3750 g | Freq: Three times a day (TID) | INTRAVENOUS | Status: AC
  Administered 2021-01-30 – 2021-02-02 (×11): 3.375 g via INTRAVENOUS
  Filled 2021-01-30 (×11): qty 50

## 2021-01-30 MED ORDER — FUROSEMIDE 10 MG/ML IJ SOLN
40.0000 mg | Freq: Two times a day (BID) | INTRAMUSCULAR | Status: AC
  Administered 2021-01-30 (×2): 40 mg via INTRAVENOUS
  Filled 2021-01-30 (×2): qty 4

## 2021-01-30 MED ORDER — OXYCODONE HCL 5 MG PO TABS: 5.0000 mg | ORAL_TABLET | Freq: Four times a day (QID) | ORAL | Status: DC

## 2021-01-30 NOTE — Progress Notes (Signed)
Pharmacy Antibiotic Note  [name] Carrillo is a 54 y.o. female admitted on 01/20/2021 with moderate-sefvere alcohol withdrawal. UTI due to E. Coli/Klebsiella and aspiration pneumonia. Pharmacy has been consulted for Zosyn dosing.  Pt found to have UTI d/t E. Coli and Klebsiella and possible aspiration with unprotected airway. Now intubated on dexmetetomidine with RASS score -4.  WBC elevated 20.7 and febrile with TMAX 100.9. Renal function table (Scr < 1). Plan is to transition from ceftriaxone to broad spectrum ABX due to worsening pt status.  Plan: Start Zosyn 3.375g IV q8h (4 hour infusion). Monitor clinical course and LOT.  Height: 5\' 5"  (165.1 cm) Weight: 94.9 kg (209 lb 3.5 oz) IBW/kg (Calculated) : 57  Temp (24hrs), Avg:100 F (37.8 C), Min:97.6 F (36.4 C), Max:100.9 F (38.3 C)  Recent Labs  Lab 01/24/21 0522 01/25/21 0338 01/25/21 0458 01/26/21 0507 01/27/21 0359 01/28/21 0409 01/29/21 0401 01/29/21 1729 01/30/21 0403  WBC 16.6* 14.2*  --   --   --  18.1* 20.6*  --  20.7*  CREATININE 0.47  --    < > 1.15* 0.97 0.72 0.79  --  0.70  LATICACIDVEN  --   --   --   --   --   --   --  1.7  --    < > = values in this interval not displayed.    Estimated Creatinine Clearance: 89.5 mL/min (by C-G formula based on SCr of 0.7 mg/dL).    No Known Allergies  Antimicrobials this admission: 10/24 ceftriaxone >> 10/29 10/29 Zosyn >>  10/27 rifaximin (metabolic encephalopathy/alcohol withdrawal) >>  Dose adjustments this admission: None  Microbiology results: 10/19 BCx: NG x 5 days 10/18 UCx: E. Coli, Klebsiella pneumoniae  10/28 Tracheal aspirate: abundant WBC (PNM and mononuclear), rare yeast 10/28 MRSA PCR: Not detected  Thank you for allowing pharmacy to be a part of this patient's care.  11/28, PharmD Pharmacy Resident  01/30/2021 8:40 AM

## 2021-01-30 NOTE — TOC Initial Note (Signed)
Transition of Care [facility]) - Initial/Assessment Note    Patient Details  Name: [name] Carrillo MRN: [id number] Date of Birth: [birth year]  Transition of [facility]) CM/SW Contact:    Hetty Ely, RN Phone Number: 01/30/2021, 3:42 PM  Clinical Narrative:  TOCRN unable to assess, patient intubated/sedated, will continue to track.                 Expected Discharge Plan: Home/Self Care Barriers to Discharge: Continued Medical Work up   Patient Goals and CMS Choice        Expected Discharge Plan and Services Expected Discharge Plan: Home/Self Care     Post Acute Care Choice: NA Living arrangements for the past 2 months: Single Family Home                                      Prior Living Arrangements/Services Living arrangements for the past 2 months: Single Family Home Lives with:: Spouse Patient language and need for interpreter reviewed:: Yes Do you feel safe going back to the place where you live?: Yes      Need for Family Participation in Patient Care: Yes (Comment) Care giver support system in place?: Yes (comment)   Criminal Activity/Legal Involvement Pertinent to Current Situation/Hospitalization: No - Comment as needed  Activities of Daily Living Home Assistive Devices/Equipment: Eyeglasses ADL Screening (condition at time of admission) Patient's cognitive ability adequate to safely complete daily activities?: Yes Is the patient deaf or have difficulty hearing?: No Does the patient have difficulty seeing, even when wearing glasses/contacts?: No Does the patient have difficulty concentrating, remembering, or making decisions?: No Patient able to express need for assistance with ADLs?: Yes Does the patient have difficulty dressing or bathing?: No Independently performs ADLs?: Yes (appropriate for developmental age) Does the patient have difficulty walking or climbing stairs?: No Weakness of Legs: None Weakness of Arms/Hands: None  Permission  Sought/Granted                  Emotional Assessment Appearance:: Appears stated age Attitude/Demeanor/Rapport: Engaged Affect (typically observed): Appropriate, Calm Orientation: : Oriented to Self, Oriented to Place, Oriented to  Time, Oriented to Situation Alcohol / Substance Use: Alcohol Use Psych Involvement: No (comment)  Admission diagnosis:  Cirrhosis of liver (HCC) [K74.60] Hypoxia [R09.02] Cirrhosis of liver with ascites, unspecified hepatic cirrhosis type (HCC) [K74.60, R18.8] Altered mental status, unspecified altered mental status type [R41.82] Acute metabolic encephalopathy [G93.41] Patient Active Problem List   Diagnosis Date Noted   Cirrhosis of liver (HCC) 01/20/2021   Acute metabolic encephalopathy 01/20/2021   Depression with anxiety 01/20/2021   Alcohol abuse 01/20/2021   Acute hepatic encephalopathy 01/20/2021   Abnormal LFTs 01/20/2021   Hyponatremia 01/20/2021   UTI (urinary tract infection) 01/20/2021   Macrocytic anemia 01/20/2021   Leukocytosis 01/20/2021   Acute respiratory failure with hypoxia (HCC) 01/20/2021   Tibia fracture 02/12/2017   PCP:  Patient, No Pcp Per (Inactive) Pharmacy:   [id number] Nicholes Rough, [place] - [address] AT [facility] [address] [place] [address] Phone: [phone] Fax: [phone]     Social Determinants of Health (SDOH) Interventions    Readmission Risk Interventions No flowsheet data found.

## 2021-01-30 NOTE — Progress Notes (Signed)
NAME:  [name] Carrillo, MRN:  [id number], DOB:  06/14/1965, LOS: 17 ADMISSION DATE:  01/20/2021, CONSULTATION DATE:  01/24/21 REFERRING MD:  Priscella Mann, CHIEF COMPLAINT:  unresponsive   History of Present Illness:  55 yo F wthi hx of MDD, Anxiety disorder, osteomyelitis of left tibia, left tibial fracture history, recurrent UTIs, EtOH abuse and liver Cirrhosis ([place]), active alcoholism, tobacco abuse with multiple readmissions to hospital with EtOH withdrawal syndrome and now being brought into MICU due to unstable vital signs and aggitated hyperactive delerium with concerns for delerium tremens. Blood work is notable for transaminitis with decreased synthetic function.     Patient had ziplock bag with two teeth in there due to loose teeth in mouth. Husband shares that patient has few false teeth that she had created herself.    Ive met with husband and son and explained that patient is unresponsive with GCS7 and is not able to protect airway. They understand the severity of situation and agree for intubation if needed. We discussed goals of care and patient is full code.   Pertinent  Medical History  MDD Anxiety Alcohol use disorder Alcoholic cirrhosis Smoking  Significant Hospital Events: Including procedures, antibiotic start and stop dates in addition to other pertinent events   10/19 Pt admitted to the [facility] unit with delirium tremors secondary to ETOH withdrawal  10/19: CT Head negative  10/19: CT Abd/Pelvis revealed Hepatomegaly with heterogeneous liver enhancement and evidence of portal venous hypertension including recanalized paraumbilical vein, small spleno-renal varices, and small volume ascites. Chronic liver disease strongly suspected although the enhancement pattern might indicate a superimposed acute hepatitis. No evidence of bile duct obstruction. No discrete liver lesion. No obstructive uropathy but absent renal contrast excretion on the delayed images suggesting acute renal  insufficiency. Punctate nephrolithiasis. Borderline to mild cardiomegaly. Bilateral lower lobe atelectasis. Mild aortic atherosclerosis.  10/24 Transfer to ICU, intubation, CVL placed 10/28: Pt with hepatic encephalopathy currently undergoing SBT 10/5.  14L positive will administer 40 mg iv lasix x1 dose  10/29: Remains with hepatic encephalopathy, Ammonia improving today.  Net + 12L, will give 40 mg IV Lasix x2.  Low grade fever with persistent Leukocytosis ~ start Zosyn, tracheal aspirate pending  Interim History / Subjective:  -No significant events noted overnight -Remains Encephalopathic -Currently in Pressure Support Ventilation 15/5 -Low grade fever this morning (T max 100.4) -Requiring 3 mcg of Levophed ~ will increased Midodrine to 10 mg TID -Persistent Leukocytosis, tracheal aspirate pending from 10/28 ~ start empiric Zosyn after discussion with Dr. Jonnie Finner -Abdominal US on 10/28 with interval increase in size of small ascites in right abdomen ~ Consider Paracentesis -Ammonia improved to 85 (132) -2L of urine output with diuresis yesterday, remains 12L net + since admit ~ will give 40 mg IV Lasix x2 doses  Objective   Blood pressure (!) 124/54, pulse 85, temperature 99.9 F (37.7 C), resp. rate 19, height '5\' 5"'  (1.651 m), weight 94.9 kg, last menstrual period 09/07/2015, SpO2 92 %.    Vent Mode: PSV;CPAP FiO2 (%):  [40 %-50 %] 40 % Set Rate:  [16 bmp] 16 bmp Vt Set:  [400 mL] 400 mL PEEP:  [5 cmH20] 5 cmH20 Pressure Support:  [10 cmH20] 10 cmH20 Plateau Pressure:  [15 cmH20-22 cmH20] 15 cmH20   Intake/Output Summary (Last 24 hours) at 01/30/2021 0921 Last data filed at 01/30/2021 0800 Gross per 24 hour  Intake 889.31 ml  Output 2930 ml  Net -2040.69 ml    [employer]  01/24/21 1549 01/29/21 0410 01/30/21 0432  Weight: 79.4 kg 93.7 kg 94.9 kg    Examination: General: acutely ill appearing female, NAD ,mechanically intubated  HENT: supple, no JVD  Lungs: diffuse  rhonchi with crackles throughout, even, non labored Cardiovascular: sinus rhythm, no R/G, 2+ radial/1+ distal pulses, 1+ generalized edema  Abdomen: +BS x4, slightly taut, obese, abdominal distension present  Extremities: normal bulk; LLE cool to touch  Neuro: sedated, not following commands, intermittent agitation withdraws from painful stimulation, PERRL, icteric sclera  GU: indwelling foley catheter draining dark yellow urine    Resolved Hospital Problem list   Hypernatremia   Assessment & Plan:   Acute toxic /metabolic encephalopathy due to hepatic encephalopathy Moderate-severe alcohol withdrawal with delirium Sedation needs in setting of mechanical ventilation -Maintain a RASS goal of 0 to -1 -Precedex to maintain RASS goal -Avoid sedating medications as able -Daily wake up assessment -CIWA Protocol -Continue thiamine, folate and multivitamins -Continue lactulose 30 g per tube BID -Continue Rifaximin   UTI due to E. coli/Klebsiella Aspiration pneumonia -Monitor fever curve -Trend WBC's & Procalcitonin -Follow cultures as above -Completed course of Ceftriaxone -Worsening leukocytosis, CXR 10/27 concerning for infiltrates vs. Edema -Follow up Tracheal aspirate from 10/27 -Start empiric Zosyn 10/29 pending cultures & sensitivities  Acute hypoxic respiratory failure Aspiration pneumonia -Full vent support, implement lung protective strategies -Plateau pressures less than 30 cm H20 -Wean FiO2 & PEEP as tolerated to maintain O2 sats >92% -Follow intermittent Chest X-ray & ABG as needed -Spontaneous Breathing Trials when respiratory parameters met and mental status permits -Implement VAP Bundle -Prn Bronchodilators  Acute alcoholic hepatitis -Monitor hepatic function panel  -Abdominal US on 10/28 with interval increase in size of small ascites in right abdomen -Consider Paracentesis  Hypophosphatemia  Oliguric Acute kidney injury - ?HRS  -Monitor I&O's / urinary  output -Follow BMP -Ensure adequate renal perfusion -Avoid nephrotoxic agents as able -Replace electrolytes as indicated  Severe protein calorie malnutrition Dietitian consulted appreciate input~continue tube feeds  Macrocytic anemia due to folate deficiency Thrombocytopenia Likely driven by liver disease/alcoholism -Monitor for S/Sx of bleeding -Trend CBC -Heparin SQ for VTE Prophylaxis  -Transfuse for Hgb <7 -Continue folic acid supplementation   Best Practice (right click and "Reselect all SmartList Selections" daily)   Diet/type: tubefeeds DVT prophylaxis: SCD; subq heparin  GI prophylaxis: PPI Lines: Central line Foley:  Yes and still indicated  Code Status:  full code Last date of multidisciplinary goals of care discussion [01/30/2021]  Critical care time: 40 minutes    Darel Hong, AGACNP-BC  Pulmonary & Critical Care Prefer epic messenger for cross cover needs If after hours, please call E-link

## 2021-01-31 ENCOUNTER — Inpatient Hospital Stay: Payer: BC Managed Care – PPO

## 2021-01-31 DIAGNOSIS — R7401 Elevation of levels of liver transaminase levels: Secondary | ICD-10-CM

## 2021-01-31 DIAGNOSIS — J9601 Acute respiratory failure with hypoxia: Secondary | ICD-10-CM | POA: Diagnosis not present

## 2021-01-31 DIAGNOSIS — G9341 Metabolic encephalopathy: Secondary | ICD-10-CM | POA: Diagnosis not present

## 2021-01-31 DIAGNOSIS — K7682 Hepatic encephalopathy: Secondary | ICD-10-CM | POA: Diagnosis not present

## 2021-01-31 DIAGNOSIS — K7030 Alcoholic cirrhosis of liver without ascites: Secondary | ICD-10-CM

## 2021-01-31 DIAGNOSIS — I639 Cerebral infarction, unspecified: Secondary | ICD-10-CM

## 2021-01-31 DIAGNOSIS — F1010 Alcohol abuse, uncomplicated: Secondary | ICD-10-CM | POA: Diagnosis not present

## 2021-01-31 LAB — BRAIN NATRIURETIC PEPTIDE: B Natriuretic Peptide: 996.6 pg/mL — ABNORMAL HIGH (ref 0.0–100.0)

## 2021-01-31 LAB — CBC
HCT: 27.7 % — ABNORMAL LOW (ref 36.0–46.0)
Hemoglobin: 8.6 g/dL — ABNORMAL LOW (ref 12.0–15.0)
MCH: 38.4 pg — ABNORMAL HIGH (ref 26.0–34.0)
MCHC: 31 g/dL (ref 30.0–36.0)
MCV: 123.7 fL — ABNORMAL HIGH (ref 80.0–100.0)
Platelets: 158 10*3/uL (ref 150–400)
RBC: 2.24 MIL/uL — ABNORMAL LOW (ref 3.87–5.11)
RDW: 25.2 % — ABNORMAL HIGH (ref 11.5–15.5)
WBC: 17.5 10*3/uL — ABNORMAL HIGH (ref 4.0–10.5)
nRBC: 0.1 % (ref 0.0–0.2)

## 2021-01-31 LAB — GLUCOSE, CAPILLARY
Glucose-Capillary: 184 mg/dL — ABNORMAL HIGH (ref 70–99)
Glucose-Capillary: 169 mg/dL — ABNORMAL HIGH (ref 70–99)
Glucose-Capillary: 170 mg/dL — ABNORMAL HIGH (ref 70–99)
Glucose-Capillary: 201 mg/dL — ABNORMAL HIGH (ref 70–99)
Glucose-Capillary: 190 mg/dL — ABNORMAL HIGH (ref 70–99)
Glucose-Capillary: 177 mg/dL — ABNORMAL HIGH (ref 70–99)

## 2021-01-31 LAB — LIPID PANEL
Cholesterol: 127 mg/dL (ref 0–200)
HDL: <10 mg/dL — ABNORMAL LOW (ref >40)
Triglycerides: 211 mg/dL — ABNORMAL HIGH (ref <150)
VLDL: 42 mg/dL — ABNORMAL HIGH (ref 0–40)

## 2021-01-31 LAB — COMPREHENSIVE METABOLIC PANEL
ALT: 83 U/L — ABNORMAL HIGH (ref 0–44)
AST: 239 U/L — ABNORMAL HIGH (ref 15–41)
Albumin: 2.3 g/dL — ABNORMAL LOW (ref 3.5–5.0)
Alkaline Phosphatase: 123 U/L (ref 38–126)
Anion gap: 6 (ref 5–15)
BUN: 49 mg/dL — ABNORMAL HIGH (ref 6–20)
CO2: 33 mmol/L — ABNORMAL HIGH (ref 22–32)
Calcium: 8.7 mg/dL — ABNORMAL LOW (ref 8.9–10.3)
Chloride: 107 mmol/L (ref 98–111)
Creatinine, Ser: 0.89 mg/dL (ref 0.44–1.00)
GFR, Estimated: >60 mL/min (ref >60)
Glucose, Bld: 206 mg/dL — ABNORMAL HIGH (ref 70–99)
Potassium: 3.6 mmol/L (ref 3.5–5.1)
Sodium: 146 mmol/L — ABNORMAL HIGH (ref 135–145)
Total Bilirubin: 6.6 mg/dL — ABNORMAL HIGH (ref 0.3–1.2)
Total Protein: 6.7 g/dL (ref 6.5–8.1)

## 2021-01-31 LAB — TSH: TSH: 2.202 u[IU]/mL (ref 0.350–4.500)

## 2021-01-31 LAB — PROCALCITONIN: Procalcitonin: 0.9 ng/mL

## 2021-01-31 LAB — CULTURE, RESPIRATORY W GRAM STAIN: Culture: NORMAL

## 2021-01-31 LAB — MAGNESIUM: Magnesium: 2.1 mg/dL (ref 1.7–2.4)

## 2021-01-31 LAB — AMMONIA: Ammonia: 87 umol/L — ABNORMAL HIGH (ref 9–35)

## 2021-01-31 LAB — PHOSPHORUS: Phosphorus: 2.6 mg/dL (ref 2.5–4.6)

## 2021-01-31 IMAGING — MR MR HEAD W/O CM
11 series · 48 of 48 positions shown · non-contrast
Comparison: CT head without contrast [DATE]

CLINICAL DATA: Delirium.  Encephalopathy.

EXAM:
MRI HEAD WITHOUT CONTRAST
TECHNIQUE: Multiplanar, multiecho pulse sequences of the brain and surrounding
structures were obtained without intravenous contrast.

[Series 5: ax dwi_tracew · axial · 3.0mm · 1.80mm/px · z∈[-40,+113]mm · 4 of 48 slices shown]
[im 1/48]
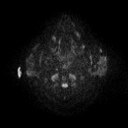
[im 16/48]
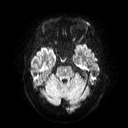
[im 32/48]
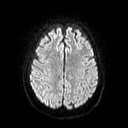
[im 48/48]
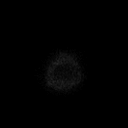

[Series 6: ax dwi_adc · axial · 3.0mm · 1.80mm/px · z∈[-40,+113]mm · 4 of 47 slices shown]
[im 1/47]
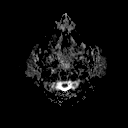
[im 16/47]
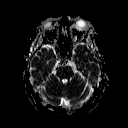
[im 31/47]
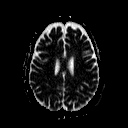
[im 47/47]
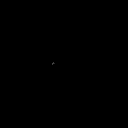

[Series 7: cor dwi_tracew · coronal · 5.0mm · 1.80mm/px · 3 of 32 slices shown]
[im 1/32]
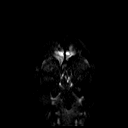
[im 16/32]
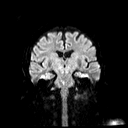
[im 32/32]
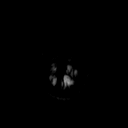

[Series 8: cor dwi_adc · coronal · 5.0mm · 1.80mm/px · 3 of 32 slices shown]
[im 1/32]
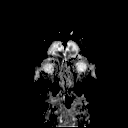
[im 16/32]
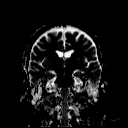
[im 32/32]
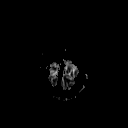

[Series 9: T1 · sagittal · 5.0mm · 0.62mm/px · 2 of 19 slices shown (1 of 2)]
[im 1/19]
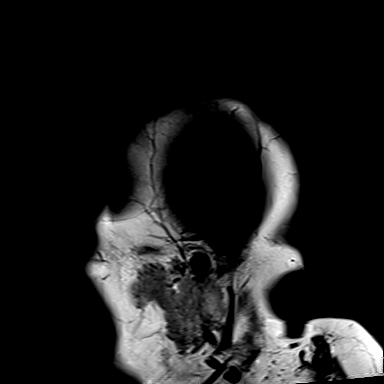
[im 19/19]
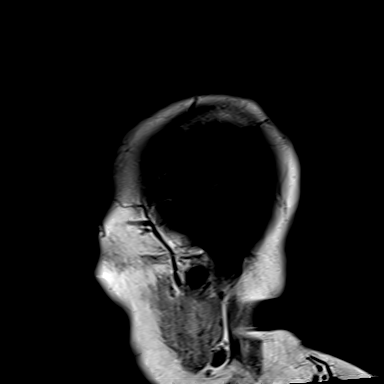

[Series 10: T2 · axial · 5.0mm · 0.86mm/px · z∈[-39,+109]mm · 2 of 25 slices shown (1 of 2)]
[im 1/25]
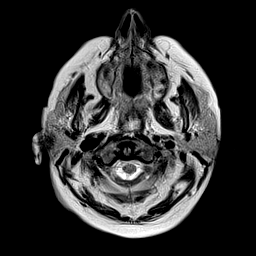
[im 25/25]
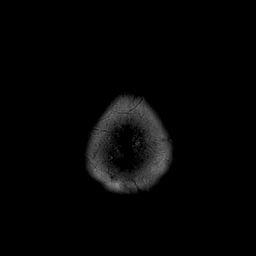

[Series 12: pha_images · axial · 3.0mm · 0.90mm/px · z∈[-37,+110]mm · 4 of 51 slices shown]
[im 1/51]
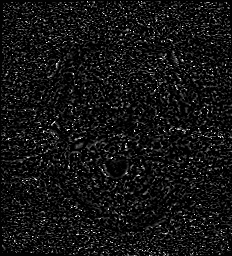
[im 17/51]
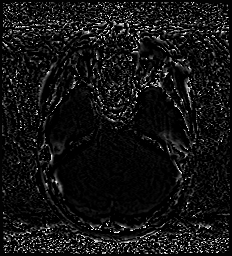
[im 34/51]
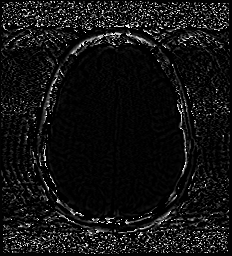
[im 51/51]
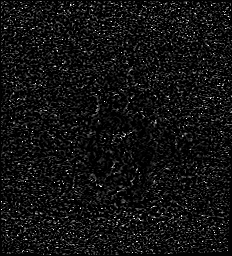

[Series 13: swi_images · axial · 3.0mm · 0.90mm/px · z∈[-40,+110]mm · 4 of 52 slices shown]
[im 1/52]
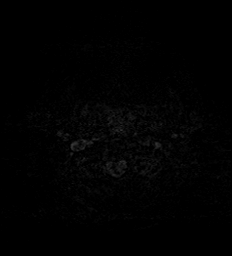
[im 18/52]
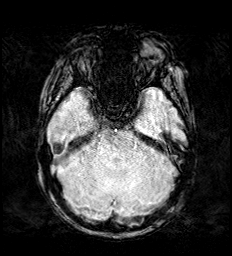
[im 35/52]
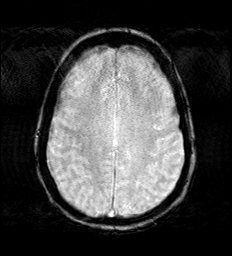
[im 52/52]
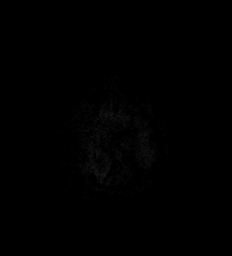

[Series 15: FLAIR · axial · 3.0mm · 0.69mm/px · z∈[-45,+115]mm · 5 of 55 slices shown]
[im 1/55]
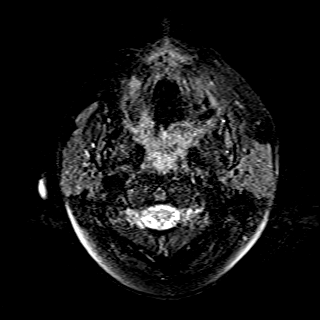
[im 14/55]
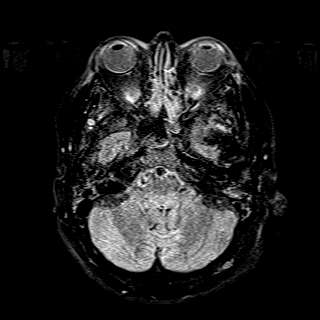
[im 28/55]
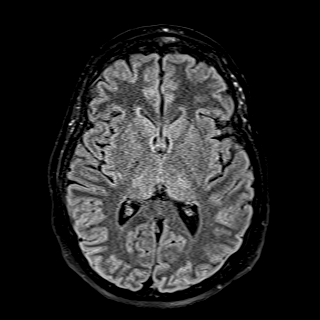
[im 41/55]
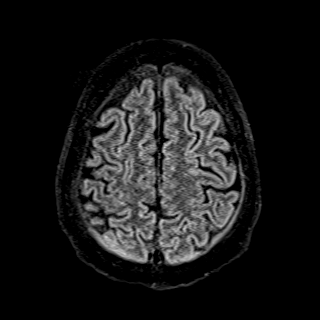
[im 55/55]
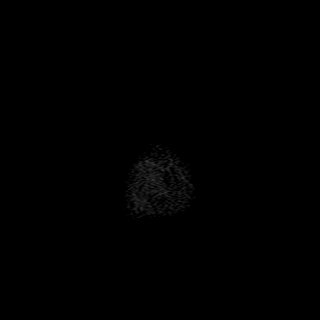

[Series 16: T1 · axial · 1.0mm · 0.98mm/px · z∈[-42,+130]mm · 15 of 176 slices shown (2 of 2)]
[im 1/176]
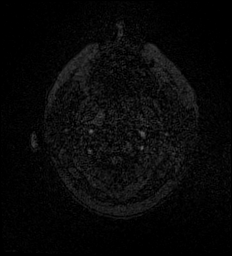
[im 13/176]
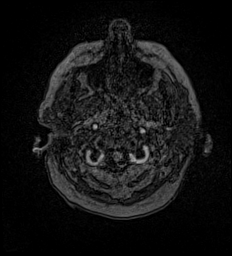
[im 26/176]
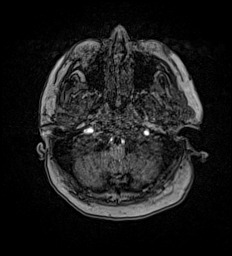
[im 38/176]
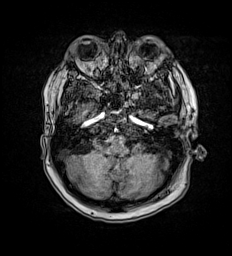
[im 51/176]
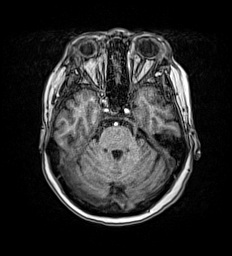
[im 63/176]
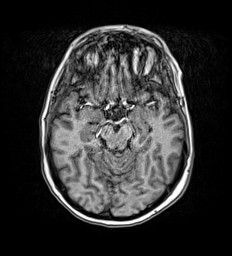
[im 76/176]
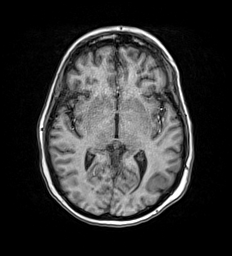
[im 88/176]
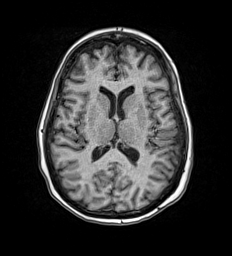
[im 101/176]
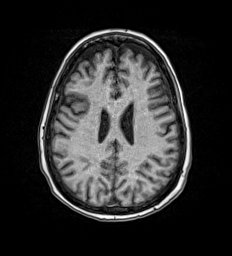
[im 113/176]
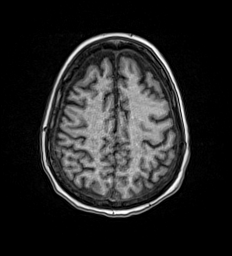
[im 126/176]
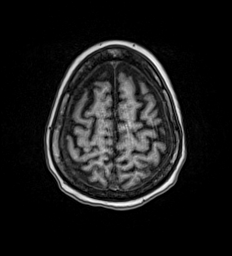
[im 138/176]
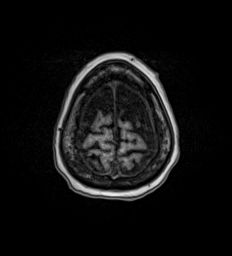
[im 151/176]
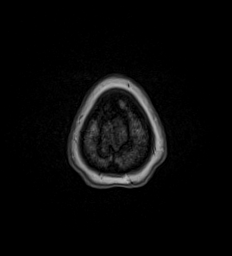
[im 163/176]
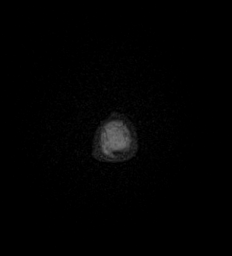
[im 176/176]
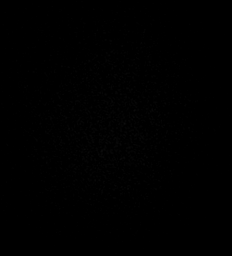

[Series 17: T2 · coronal · 5.0mm · 0.86mm/px · 2 of 25 slices shown (2 of 2)]
[im 1/25]
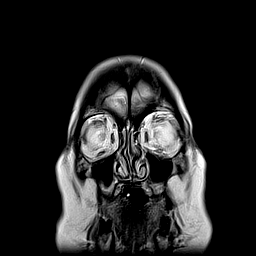
[im 25/25]
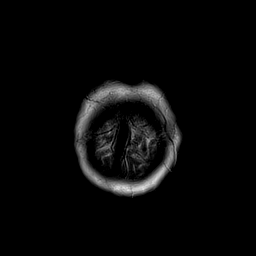

[48 of 48 positions shown; findings below may reference images not displayed]

FINDINGS: Brain: An acute 9 mm right paramedian nonhemorrhagic infarct is
present in the central pons. Symmetric T2 signal changes and
restricted diffusion is present in the thalami bilaterally.

No other significant white matter disease is present. The ventricles
are of normal size. No significant extraaxial fluid collection is
present.

The internal auditory canals are within normal limits.

Brainstem and cerebellum are otherwise unremarkable.

Vascular: Flow is present in the major intracranial arteries.

Skull and upper cervical spine: The craniocervical junction is
normal. Upper cervical spine is within normal limits. Marrow signal
is unremarkable.

Sinuses/Orbits: Fluid is present in the nasopharynx, likely related
to intubation. Bilateral mastoid effusions are present. Minimal
mucosal thickening is present in the sphenoid sinuses. Paranasal
sinuses are otherwise clear. The globes and orbits are within normal
limits.
IMPRESSION: 1. Acute/subacute 9 mm right paramedian nonhemorrhagic infarct in
the central pons.
2. Symmetric T2 signal changes and restricted diffusion in the
thalami bilaterally. This is nonspecific, but may represent artery
of AHMEDE infarcts.
3. Bilateral mastoid effusions. Fluid in the nasopharynx, likely
related to intubation.

## 2021-01-31 MED ORDER — FUROSEMIDE 10 MG/ML IJ SOLN
80.0000 mg | INTRAMUSCULAR | Status: AC
  Administered 2021-01-31 (×2): 80 mg via INTRAVENOUS
  Filled 2021-01-31 (×2): qty 8

## 2021-01-31 MED ORDER — ASPIRIN 81 MG PO CHEW
81.0000 mg | CHEWABLE_TABLET | Freq: Every day | ORAL | Status: DC
  Administered 2021-01-31 – 2021-02-07 (×8): 81 mg
  Filled 2021-01-31 (×8): qty 1

## 2021-01-31 MED ORDER — ALBUMIN HUMAN 25 % IV SOLN
25.0000 g | Freq: Four times a day (QID) | INTRAVENOUS | Status: DC
Start: 2021-01-31 — End: 2021-01-31
  Administered 2021-01-31: 25 g via INTRAVENOUS
  Filled 2021-01-31: qty 100

## 2021-01-31 NOTE — Progress Notes (Signed)
55 yo woman with hx MDD, EtOH abuse (active c/b liver cirrhosis, tobacco abuse, multiple previous hospital admissions for EtOH withdrawal who presented to ED 01/20/21 with hepatic encephalopathy, jaundice, and abd pain. She was admitted to floor but ultimately trasferred to ICU for unstable vital signs and hyperactive delirium c/f DTs. MRI brain 10/30 showed acute/subacute paramedian pontine ischemic infarct. Encephalopathy likely multifactorial in setting of hyperammonemia, hepatitis, aspiration PNA, acute/subacute ischemic pontine infarct.   - Suspect >48 hrs from acute ischemic event based on imaging. Goal normotension, avoid hypotension. Continue midodrine 10mg  tid for hypotension. - ASA 81mg  daily - Holding statin setting of hepatitis - MRA H&N - q4 hr neuro checks - STAT head CT for any change in neuro exam - Tele - PT/OT/SLP - Stroke education - Amb referral to neurology upon discharge  #Subacute encephalopathy #Delirium tremens - Continue thiamine 100mg  IV daily - rEEG Mon AM  Will continue to follow.

## 2021-01-31 NOTE — Progress Notes (Signed)
NAME:  [name] Carrillo, MRN:  [id number], DOB:  [birth year], LOS: 41 ADMISSION DATE:  01/20/2021, CONSULTATION DATE:  01/24/21 REFERRING MD:  Priscella Mann, CHIEF COMPLAINT:  unresponsive   History of Present Illness:  55 yo F wthi hx of MDD, Anxiety disorder, osteomyelitis of left tibia, left tibial fracture history, recurrent UTIs, EtOH abuse and liver Cirrhosis ([place]), active alcoholism, tobacco abuse with multiple readmissions to hospital with EtOH withdrawal syndrome and now being brought into MICU due to unstable vital signs and aggitated hyperactive delerium with concerns for delerium tremens. Blood work is notable for transaminitis with decreased synthetic function.     Patient had ziplock bag with two teeth in there due to loose teeth in mouth. Husband shares that patient has few false teeth that she had created herself.    Ive met with husband and son and explained that patient is unresponsive with GCS7 and is not able to protect airway. They understand the severity of situation and agree for intubation if needed. We discussed goals of care and patient is full code.   Pertinent  Medical History  MDD Anxiety Alcohol use disorder Alcoholic cirrhosis Smoking  Significant Hospital Events: Including procedures, antibiotic start and stop dates in addition to other pertinent events   10/19 Pt admitted to the [facility] unit with delirium tremors secondary to ETOH withdrawal  10/19: CT Head negative  10/19: CT Abd/Pelvis revealed Hepatomegaly with heterogeneous liver enhancement and evidence of portal venous hypertension including recanalized paraumbilical vein, small spleno-renal varices, and small volume ascites. Chronic liver disease strongly suspected although the enhancement pattern might indicate a superimposed acute hepatitis. No evidence of bile duct obstruction. No discrete liver lesion. No obstructive uropathy but absent renal contrast excretion on the delayed images suggesting acute renal  insufficiency. Punctate nephrolithiasis. Borderline to mild cardiomegaly. Bilateral lower lobe atelectasis. Mild aortic atherosclerosis.  10/24 Transfer to ICU, intubation, CVL placed 10/28: Pt with hepatic encephalopathy currently undergoing SBT 10/5.  14L positive will administer 40 mg iv lasix x1 dose  10/29: Remains with hepatic encephalopathy, Ammonia improving today.  Net + 12L, will give 40 mg IV Lasix x2.  Low grade fever with persistent Leukocytosis ~ start Zosyn, tracheal aspirate pending  Interim History / Subjective:  Fever curve downtrending along with improved WBC count after reinitiation of Zosyn on 10/29. Still mildly positive fluid balance last 24 hours. Off pressors.  Objective   Blood pressure (!) 109/55, pulse 66, temperature 99 F (37.2 C), resp. rate 15, height _0  (1.651 m), weight 94.1 kg, last menstrual period 09/07/2015, SpO2 98 %.    Vent Mode: PRVC FiO2 (%):  [40 %-50 %] 40 % Set Rate:  [16 bmp-40 bmp] 40 bmp Vt Set:  [400 mL] 400 mL PEEP:  [5 cmH20] 5 cmH20 Plateau Pressure:  [14 cmH20-18 cmH20] 15 cmH20   Intake/Output Summary (Last 24 hours) at 01/31/2021 1017 Last data filed at 01/31/2021 0800 Gross per 24 hour  Intake 2035.32 ml  Output 2267 ml  Net -231.68 ml   [employer]   01/29/21 0410 01/30/21 0432 01/31/21 0414  Weight: 93.7 kg 94.9 kg 94.1 kg    Examination: General: acutely ill appearing female, NAD ,mechanically intubated  HENT: supple, no JVD  Lungs: diffuse rhonchi with crackles throughout, even, non labored Cardiovascular: sinus rhythm, no R/G, 2+ radial/1+ distal pulses, 1+ generalized edema  Abdomen: +BS x4, slightly taut, obese, abdominal distension present  Extremities: normal bulk; LLE cool to touch  Neuro: sedated, not following commands,  intermittent agitation withdraws from painful stimulation, PERRL, icteric sclera  GU: indwelling foley catheter draining dark yellow urine    Resolved Hospital Problem list    Hypernatremia   Assessment & Plan:   Acute toxic /metabolic encephalopathy due to hepatic encephalopathy Moderate-severe alcohol withdrawal with delirium Sedation needs in setting of mechanical ventilation -Maintain a RASS goal of 0 to -1 -Precedex to maintain RASS goal -Avoid sedating medications as able -Daily wake up assessment -CIWA Protocol -Continue thiamine, folate and multivitamins -Continue lactulose 30 g per tube BID -Continue Rifaximin   UTI due to E. coli/Klebsiella Aspiration pneumonia -Monitor fever curve -Trend WBC's & Procalcitonin -Follow cultures as above -Completed course of Ceftriaxone -Worsening leukocytosis, CXR 10/27 concerning for infiltrates vs. Edema -Follow up Tracheal aspirate from 10/27 -Start empiric Zosyn 10/29 pending cultures & sensitivities  Acute hypoxic respiratory failure Aspiration pneumonia -Full vent support, implement lung protective strategies -Plateau pressures less than 30 cm H20 -Wean FiO2 & PEEP as tolerated to maintain O2 sats >92% -Follow intermittent Chest X-ray & ABG as needed -Spontaneous Breathing Trials when respiratory parameters met and mental status permits -Implement VAP Bundle -Prn Bronchodilators  Acute alcoholic hepatitis -Monitor hepatic function panel  -Abdominal US on 10/28 with interval increase in size of small ascites in right abdomen -Consider Paracentesis  Hypophosphatemia  Oliguric Acute kidney injury - ?HRS  -Monitor I&O's / urinary output -Follow BMP -Ensure adequate renal perfusion -Avoid nephrotoxic agents as able -Replace electrolytes as indicated  Severe protein calorie malnutrition Dietitian consulted appreciate input~continue tube feeds  Macrocytic anemia due to folate deficiency Thrombocytopenia Likely driven by liver disease/alcoholism -Monitor for S/Sx of bleeding -Trend CBC -Heparin SQ for VTE Prophylaxis  -Transfuse for Hgb <7 -Continue folic acid supplementation   Best  Practice (right click and "Reselect all SmartList Selections" daily)   Diet/type: tubefeeds DVT prophylaxis: SCD; subq heparin  GI prophylaxis: PPI Lines: Central line Foley:  Yes and still indicated  Code Status:  full code Last date of multidisciplinary goals of care discussion [01/30/2021]  Critical care time: 38 minutes    Bennie Pierini, MD 01/31/21 10:17 AM

## 2021-01-31 NOTE — Plan of Care (Signed)
  Problem: Education: Goal: Knowledge of General Education information will improve Description: Including pain rating scale, medication(s)/side effects and non-pharmacologic comfort measures Outcome: Not Progressing   Problem: Health Behavior/Discharge Planning: Goal: Ability to manage health-related needs will improve Outcome: Not Progressing   Problem: Clinical Measurements: Goal: Ability to maintain clinical measurements within normal limits will improve Outcome: Not Progressing Goal: Will remain free from infection Outcome: Not Progressing Goal: Diagnostic test results will improve Outcome: Not Progressing Goal: Respiratory complications will improve Outcome: Not Progressing Goal: Cardiovascular complication will be avoided Outcome: Not Progressing   Problem: Activity: Goal: Risk for activity intolerance will decrease Outcome: Not Progressing   Problem: Nutrition: Goal: Adequate nutrition will be maintained Outcome: Not Progressing   Problem: Coping: Goal: Level of anxiety will decrease Outcome: Not Progressing   Problem: Elimination: Goal: Will not experience complications related to bowel motility Outcome: Not Progressing Goal: Will not experience complications related to urinary retention Outcome: Not Progressing   Problem: Pain Managment: Goal: General experience of comfort will improve Outcome: Not Progressing   Problem: Safety: Goal: Ability to remain free from injury will improve Outcome: Not Progressing   Problem: Skin Integrity: Goal: Risk for impaired skin integrity will decrease Outcome: Not Progressing   Problem: Respiratory: Goal: Ability to maintain a clear airway and adequate ventilation will improve Outcome: Not Progressing   

## 2021-01-31 NOTE — Progress Notes (Signed)
Pharmacy Antibiotic Note  [name] Carrillo is a 55 y.o. female admitted on 01/20/2021 with moderate-sefvere alcohol withdrawal. UTI due to E. Coli/Klebsiella and aspiration pneumonia. Pharmacy has been consulted for Zosyn dosing.  Pt found to have UTI d/t E. Coli and Klebsiella and possible aspiration with unprotected airway. Now intubated on dexmetetomidine with RASS score -4.  WBC elevated 20.7 and febrile with TMAX 100.9. Renal function table (Scr < 1). Plan is to transition from ceftriaxone to broad spectrum ABX due to worsening pt status.  Day 8 antibiotics  Plan: Continue Zosyn 3.375g IV q8h (4 hour infusion). Monitor clinical course and LOT.  Height: 5\' 5"  (165.1 cm) Weight: 94.1 kg (207 lb 7.3 oz) IBW/kg (Calculated) : 57  Temp (24hrs), Avg:99.5 F (37.5 C), Min:99 F (37.2 C), Max:100.4 F (38 C)  Recent Labs  Lab 01/25/21 0338 01/25/21 0458 01/27/21 0359 01/28/21 0409 01/29/21 0401 01/29/21 1729 01/30/21 0403 01/31/21 0408  WBC 14.2*  --   --  18.1* 20.6*  --  20.7* 17.5*  CREATININE  --    < > 0.97 0.72 0.79  --  0.70 0.89  LATICACIDVEN  --   --   --   --   --  1.7  --   --    < > = values in this interval not displayed.     Estimated Creatinine Clearance: 80 mL/min (by C-G formula based on SCr of 0.89 mg/dL).    No Known Allergies  Antimicrobials this admission: 10/24 ceftriaxone >> 10/29 10/29 Zosyn >>  10/27 rifaximin (metabolic encephalopathy/alcohol withdrawal) >>  Dose adjustments this admission: None  Microbiology results: 10/19 BCx: NG x 5 days 10/18 UCx: E. Coli, Klebsiella pneumoniae  10/28 Tracheal aspirate: abundant WBC (PNM and mononuclear), rare yeast 10/28 MRSA PCR: Not detected  Thank you for allowing pharmacy to be a part of this patient's care.  11/28, PharmD Clinical Pharmacist 01/31/2021 11:24 AM

## 2021-01-31 NOTE — Plan of Care (Signed)
MRI brain showed acute/subacute pontine infarct and T2 signal abnormality with restricted diffusion in the bilateral thalami.  She remains on minimal sedation with Precedex.  Will consult Neurology (Dr. Selina Cooley). Start ASA 81 mg. Hold statin in setting of hepatitis.  Marcelo Baldy, MD 01/31/21 1:44 PM

## 2021-01-31 NOTE — Progress Notes (Signed)
RT assisted with patient transport to MRI while patient on the Trilogy ventilator. No complications occurred during transport.

## 2021-02-01 ENCOUNTER — Inpatient Hospital Stay: Payer: BC Managed Care – PPO

## 2021-02-01 ENCOUNTER — Encounter: Payer: Self-pay | Admitting: Internal Medicine

## 2021-02-01 ENCOUNTER — Inpatient Hospital Stay: Payer: Self-pay

## 2021-02-01 DIAGNOSIS — G9341 Metabolic encephalopathy: Secondary | ICD-10-CM | POA: Diagnosis not present

## 2021-02-01 LAB — CBC
HCT: 27.7 % — ABNORMAL LOW (ref 36.0–46.0)
Hemoglobin: 8.6 g/dL — ABNORMAL LOW (ref 12.0–15.0)
MCH: 38.7 pg — ABNORMAL HIGH (ref 26.0–34.0)
MCHC: 31 g/dL (ref 30.0–36.0)
MCV: 124.8 fL — ABNORMAL HIGH (ref 80.0–100.0)
Platelets: 204 10*3/uL (ref 150–400)
RBC: 2.22 MIL/uL — ABNORMAL LOW (ref 3.87–5.11)
RDW: 25.2 % — ABNORMAL HIGH (ref 11.5–15.5)
WBC: 19.6 10*3/uL — ABNORMAL HIGH (ref 4.0–10.5)
nRBC: 0.2 % (ref 0.0–0.2)

## 2021-02-01 LAB — COMPREHENSIVE METABOLIC PANEL
ALT: 87 U/L — ABNORMAL HIGH (ref 0–44)
AST: 236 U/L — ABNORMAL HIGH (ref 15–41)
Albumin: 2.6 g/dL — ABNORMAL LOW (ref 3.5–5.0)
Alkaline Phosphatase: 126 U/L (ref 38–126)
Anion gap: 7 (ref 5–15)
BUN: 54 mg/dL — ABNORMAL HIGH (ref 6–20)
CO2: 34 mmol/L — ABNORMAL HIGH (ref 22–32)
Calcium: 8.8 mg/dL — ABNORMAL LOW (ref 8.9–10.3)
Chloride: 108 mmol/L (ref 98–111)
Creatinine, Ser: 0.77 mg/dL (ref 0.44–1.00)
GFR, Estimated: >60 mL/min (ref >60)
Glucose, Bld: 183 mg/dL — ABNORMAL HIGH (ref 70–99)
Potassium: 3.3 mmol/L — ABNORMAL LOW (ref 3.5–5.1)
Sodium: 149 mmol/L — ABNORMAL HIGH (ref 135–145)
Total Bilirubin: 5.7 mg/dL — ABNORMAL HIGH (ref 0.3–1.2)
Total Protein: 6.9 g/dL (ref 6.5–8.1)

## 2021-02-01 LAB — GLUCOSE, CAPILLARY
Glucose-Capillary: 156 mg/dL — ABNORMAL HIGH (ref 70–99)
Glucose-Capillary: 160 mg/dL — ABNORMAL HIGH (ref 70–99)
Glucose-Capillary: 166 mg/dL — ABNORMAL HIGH (ref 70–99)
Glucose-Capillary: 193 mg/dL — ABNORMAL HIGH (ref 70–99)
Glucose-Capillary: 214 mg/dL — ABNORMAL HIGH (ref 70–99)
Glucose-Capillary: 241 mg/dL — ABNORMAL HIGH (ref 70–99)

## 2021-02-01 LAB — MAGNESIUM: Magnesium: 1.9 mg/dL (ref 1.7–2.4)

## 2021-02-01 LAB — POTASSIUM: Potassium: 4.3 mmol/L (ref 3.5–5.1)

## 2021-02-01 LAB — PHOSPHORUS: Phosphorus: 2.5 mg/dL (ref 2.5–4.6)

## 2021-02-01 LAB — AMMONIA: Ammonia: 47 umol/L — ABNORMAL HIGH (ref 9–35)

## 2021-02-01 IMAGING — MR MR MRA NECK WO/W CM
3 of 4 series · 27 of 48 positions shown · IV contrast (9ml Gadavist)
Comparison: Correlation is made with MRI [DATE], no prior
neurovascular imaging.

CLINICAL DATA: Neuro deficit, stroke suspected

EXAM:
MRA NECK WITHOUT AND WITH CONTRAST
MRA HEAD WITHOUT CONTRAST
TECHNIQUE: Multiplanar and multiecho pulse sequences of the neck were obtained
without and with intravenous contrast. Angiographic images of the
neck were obtained using MRA technique without and with intravenous
contrast; Angiographic images of the Circle of Willis were obtained
using MRA technique without intravenous contrast.
CONTRAST:  9mL GADAVIST GADOBUTROL 1 MMOL/ML IV SOLN

[Series 14: angio_fl3d_cor_pre_ttc=2.0s · coronal · 0.9mm · 0.85mm/px · 13 of 96 slices shown]
[im 1/96]
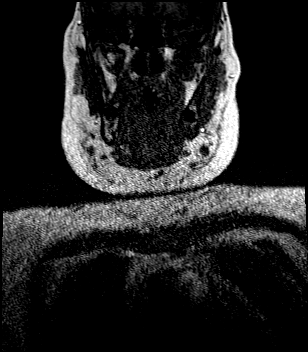
[im 8/96]
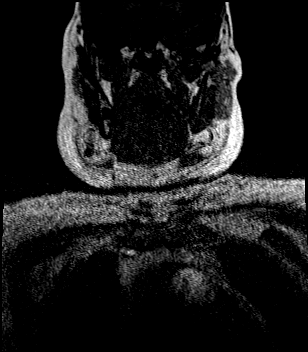
[im 16/96]
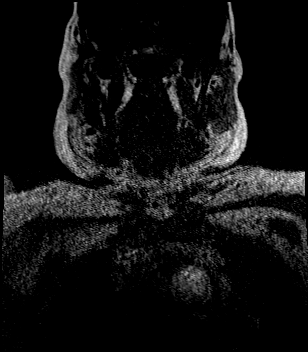
[im 24/96]
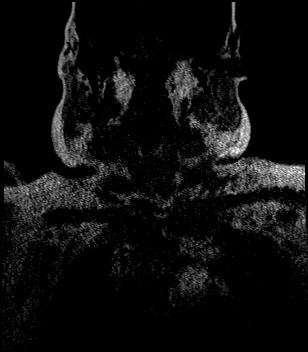
[im 32/96]
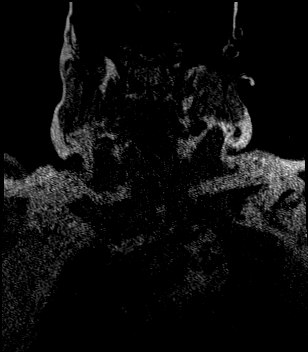
[im 40/96]
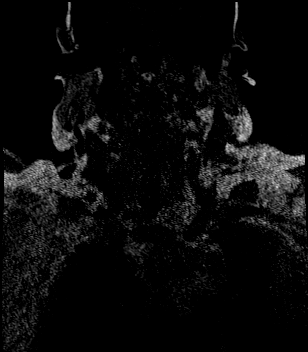
[im 48/96]
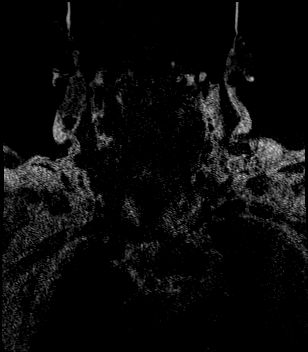
[im 56/96]
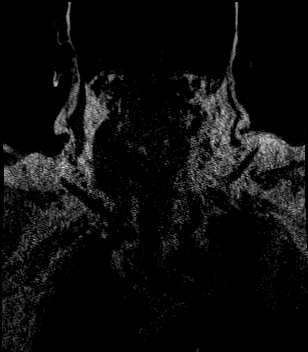
[im 64/96]
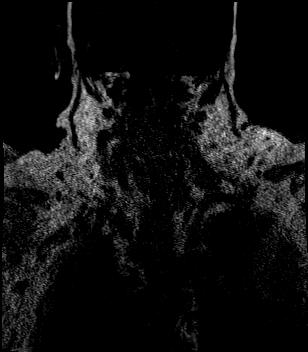
[im 72/96]
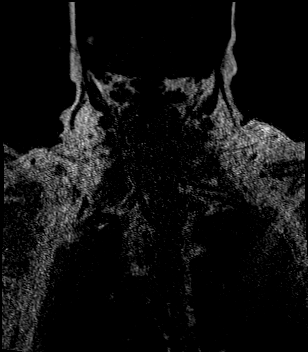
[im 80/96]
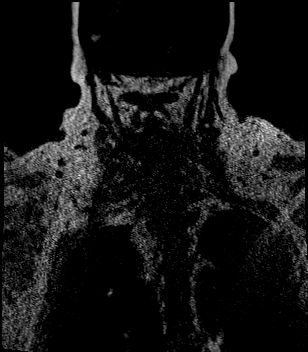
[im 88/96]
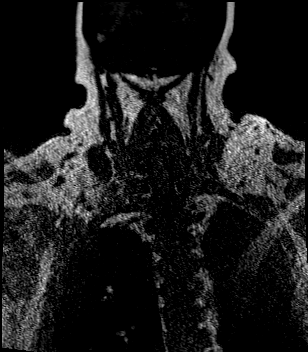
[im 96/96]
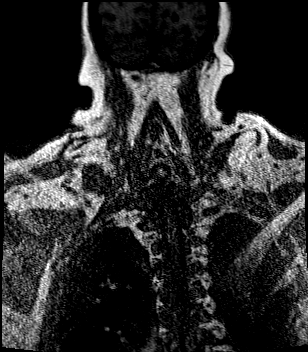

[Series 18: angio_fl3d_cor_post_ttc=2.0s_moco-adv_sub · coronal · 0.9mm · 0.85mm/px · 13 of 96 slices shown]
[im 1/96]
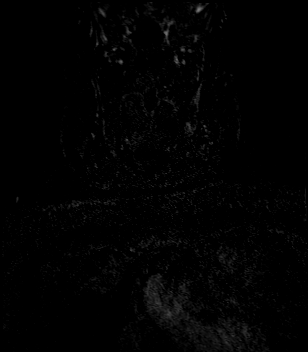
[im 8/96]
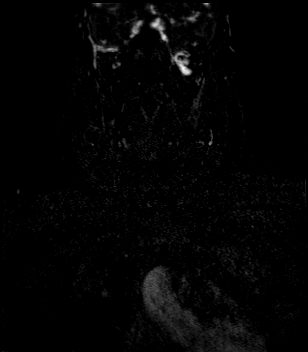
[im 16/96]
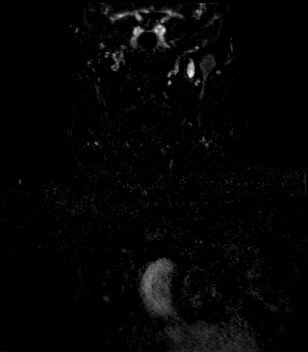
[im 24/96]
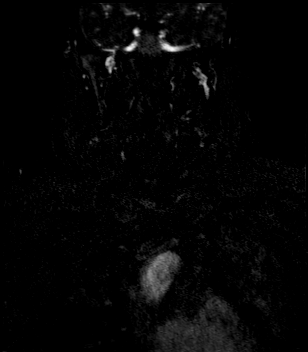
[im 32/96]
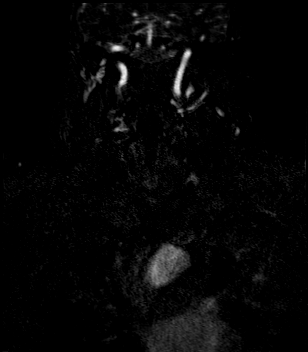
[im 40/96]
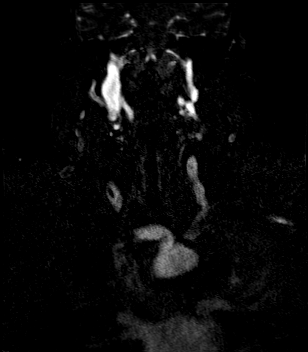
[im 48/96]
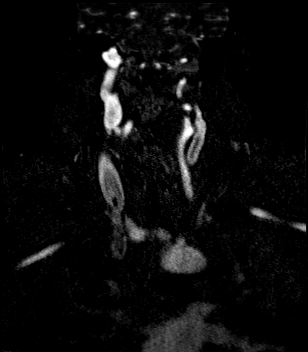
[im 56/96]
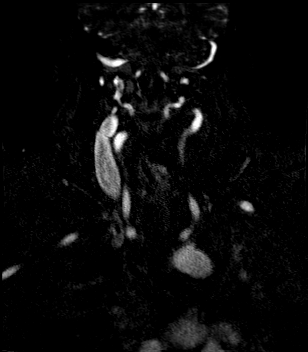
[im 64/96]
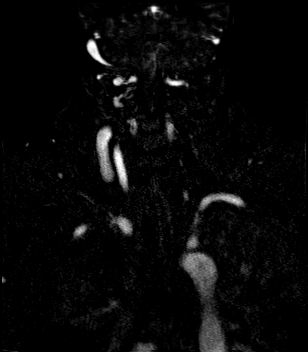
[im 72/96]
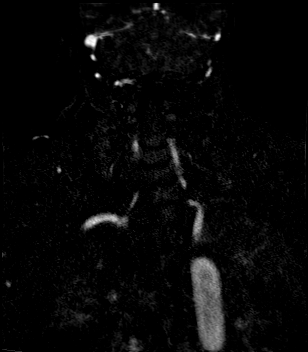
[im 80/96]
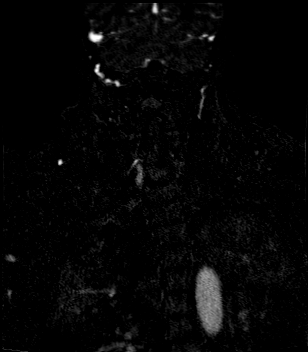
[im 88/96]
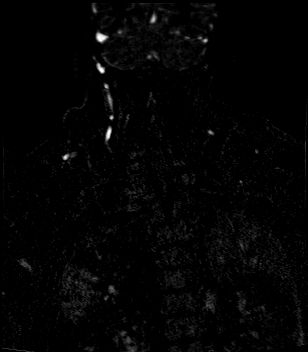
[im 96/96]
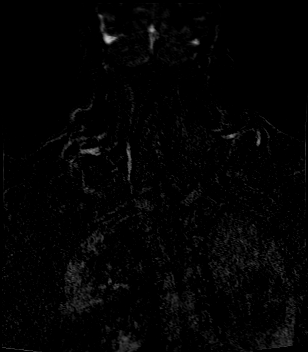

[Series 1078: vertebrals · sagittal · 0.9mm · 0.36mm/px · 1 of 6 slices shown]
[im 1/6]
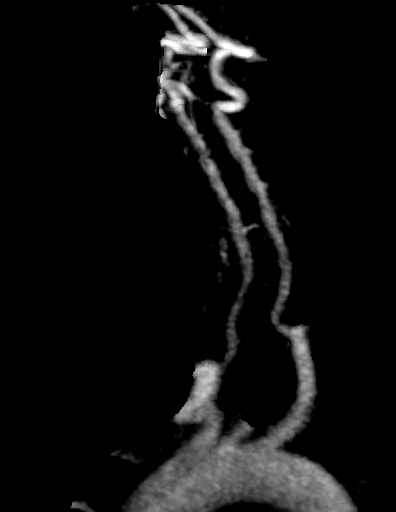

[27 of 48 positions shown; findings below may reference images not displayed]

FINDINGS: MRA NECK FINDINGS

Standard aortic branching. The bilateral carotid arteries and
vertebral arteries are patent, without stenosis (greater than 50%),
occlusion, or dissection.

MRA HEAD FINDINGS

Medially directed 6 mm outpouching from the distal left cavernous
carotid/ophthalmic segment (series 5, image 106), with a neck
measuring approximately 5 mm. Poor visualization of the right
cavernous carotid, which may indicate slow flow or stenosis (series
5, image 101). Both internal carotid arteries are otherwise patent
to the termini.

A1 segments patent. Normal anterior communicating artery. Anterior
cerebral arteries are patent to their distal aspects. No M1 stenosis
or occlusion. Normal MCA bifurcations. Distal MCA branches perfused
and symmetric.

Vertebral arteries widely patent to the vertebrobasilar junction
without stenosis. Posterior inferior cerebral arteries patent
bilaterally. Apparent narrowing of the basilar artery just proximal
to the takeoff the of the superior cerebellar arteries (series 5,
images 101-106), although this may be artifactual. Superior cerebral
arteries patent bilaterally. PCAs well perfused to their distal
aspects without stenosis. The bilateral posterior communicating
arteries are visualized.
IMPRESSION: 1. Medially directed 6 mm aneurysm from the distal left cavernous
carotid/ophthalmic segment.
2. Poor visualization of the right cavernous carotid, which may
indicate slow flow or stenosis. Consider CTA for further evaluation.
3. Apparent narrowing of the distal basilar artery, which may be
artifactual or indicate focal stenosis.
4. No hemodynamically significant stenosis in the neck.
5. No large vessel occlusion.

## 2021-02-01 IMAGING — DX DG CHEST 1V PORT
1 series · 1 of 1 positions shown · non-contrast
Comparison: [DATE]

CLINICAL DATA: Respiratory failure

EXAM:
PORTABLE CHEST 1 VIEW

[chest ap]
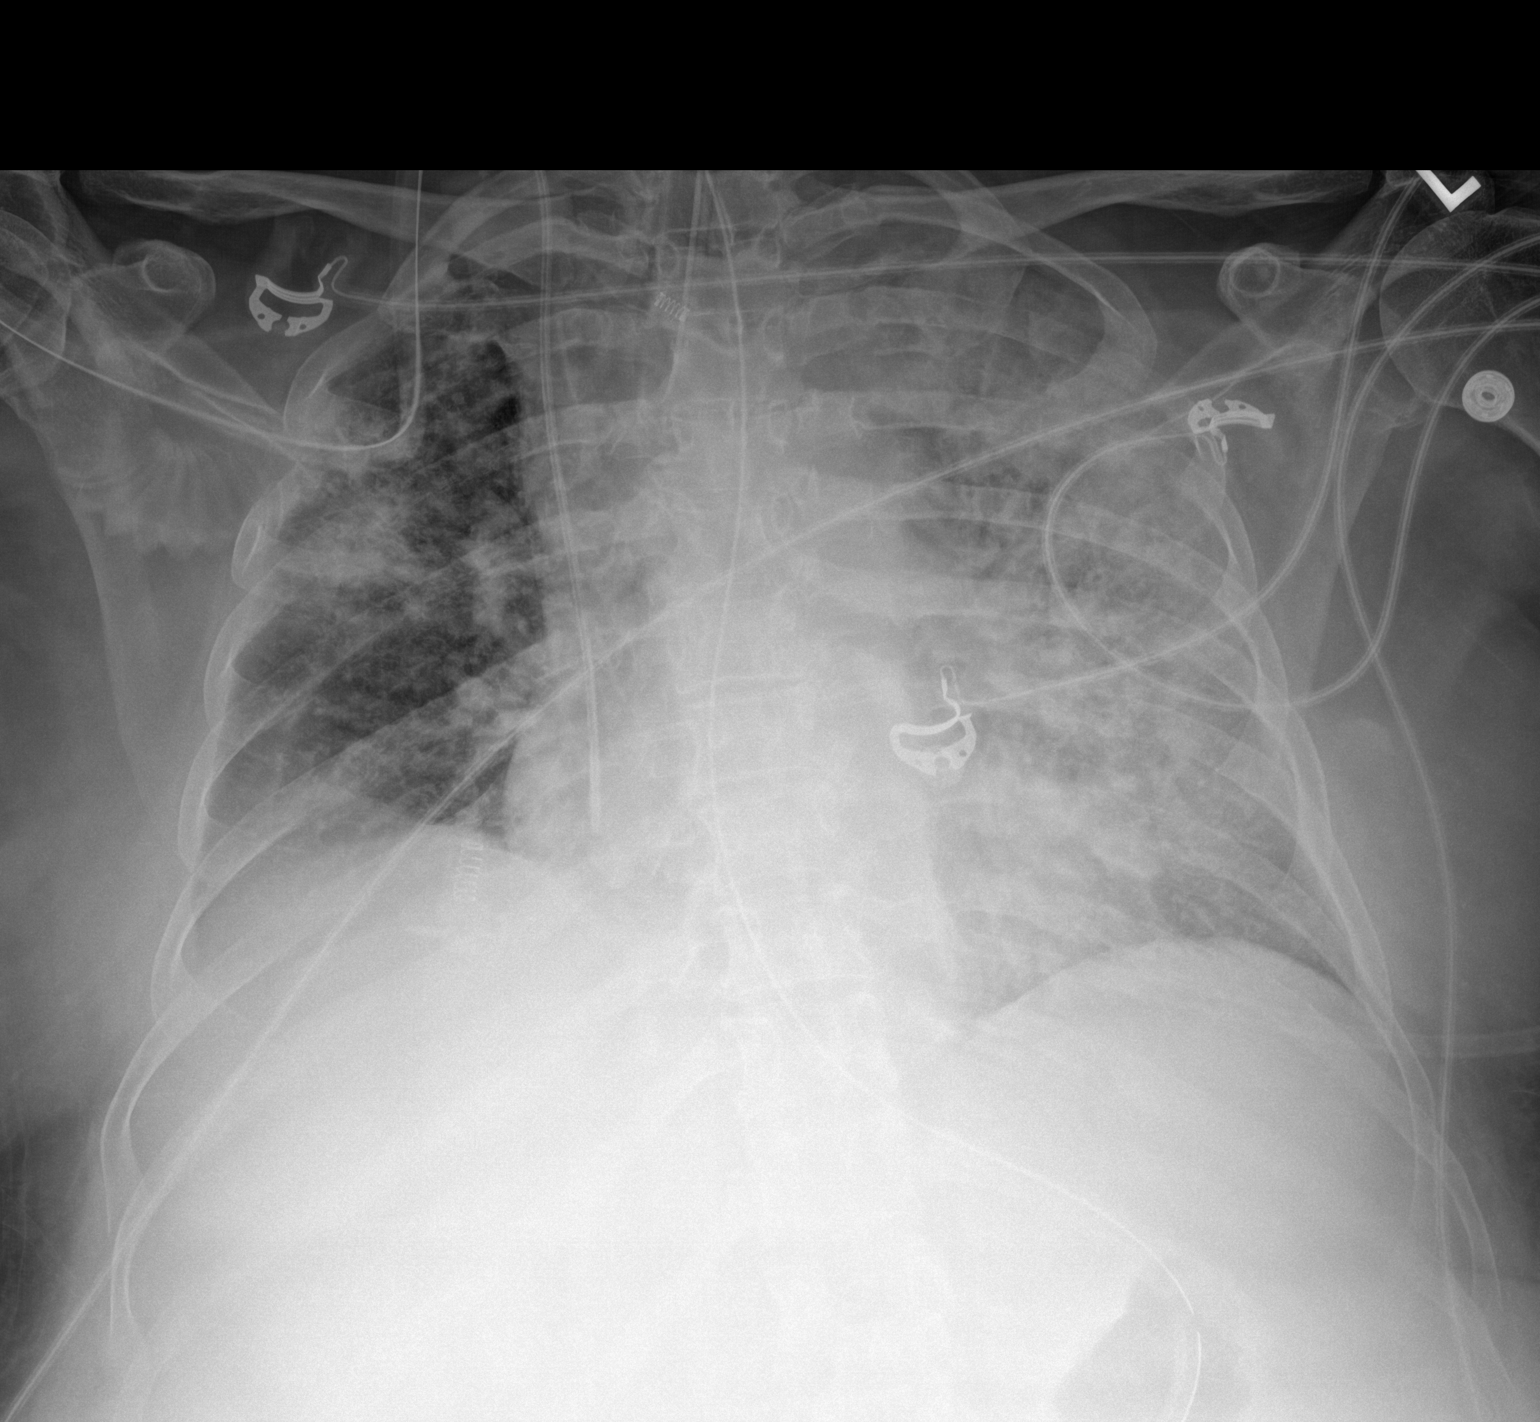

[1 of 1 positions shown; findings below may reference images not displayed]

FINDINGS: Stable positioning of endotracheal tube with tip approximately
cm above the carina. Central line position stable. Gastric
decompression tube extends into the stomach. Lungs demonstrate
persistent severe airspace disease throughout the left lung and some
more localized airspace disease in the right upper lung with
slightly improved aeration of the right lower lung. No pneumothorax
or significant pleural fluid volume. Stable heart size.
IMPRESSION: Stable airspace disease throughout the left lung. Airspace disease
localized to the right upper lobe. Slightly improved aeration of the
right lower lung.

## 2021-02-01 IMAGING — MR MR MRA HEAD W/O CM
1 series · 19 of 48 positions shown · IV contrast (gadavist)
Comparison: Correlation is made with MRI [DATE], no prior
neurovascular imaging.

CLINICAL DATA: Neuro deficit, stroke suspected

EXAM:
MRA NECK WITHOUT AND WITH CONTRAST
MRA HEAD WITHOUT CONTRAST
TECHNIQUE: Multiplanar and multiecho pulse sequences of the neck were obtained
without and with intravenous contrast. Angiographic images of the
neck were obtained using MRA technique without and with intravenous
contrast; Angiographic images of the Circle of Willis were obtained
using MRA technique without intravenous contrast.
CONTRAST:  9mL GADAVIST GADOBUTROL 1 MMOL/ML IV SOLN

[Series 5: TOF · axial · 0.5mm · 0.41mm/px · z∈[-103,-6]mm · 19 of 205 slices shown]
[im 1/205]
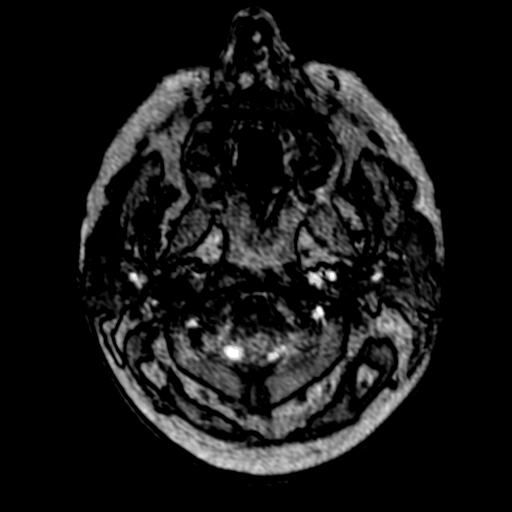
[im 5/205]
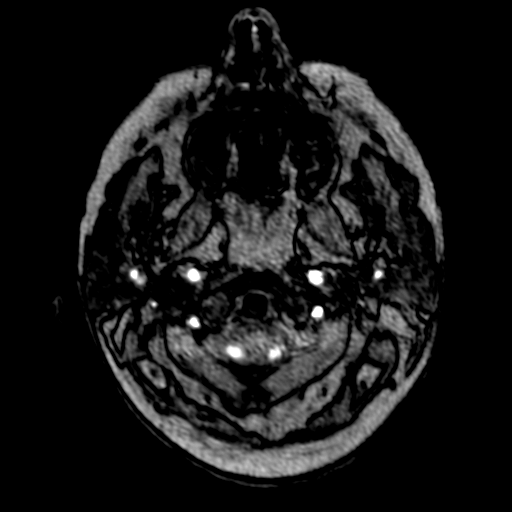
[im 9/205]
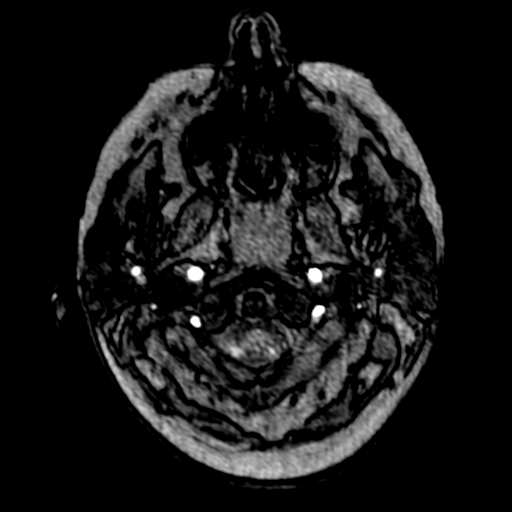
[im 14/205]
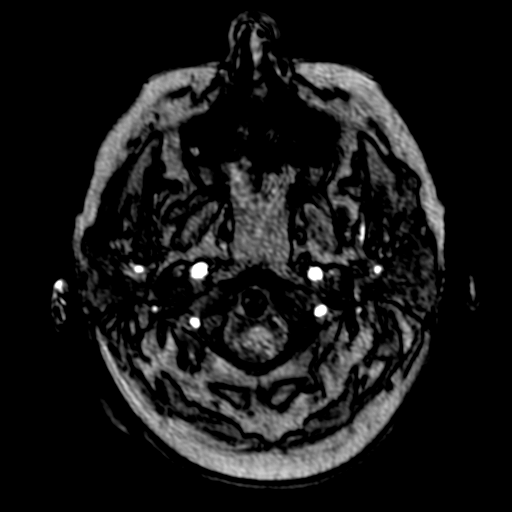
[im 18/205]
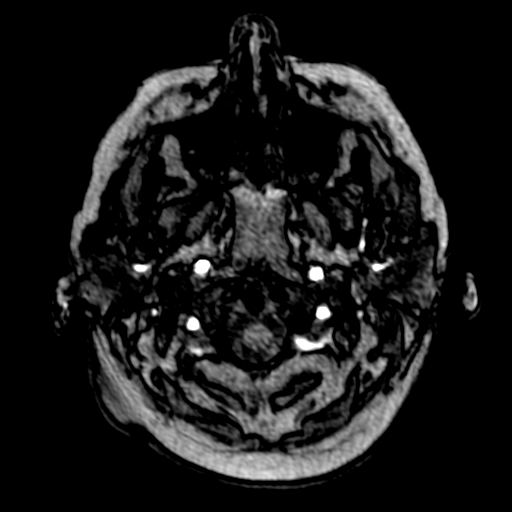
[im 22/205]
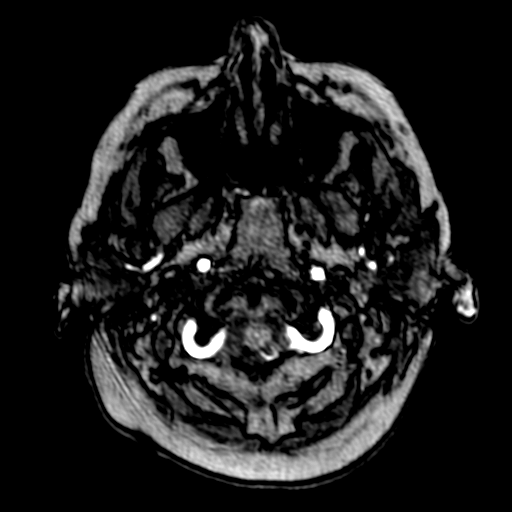
[im 27/205]
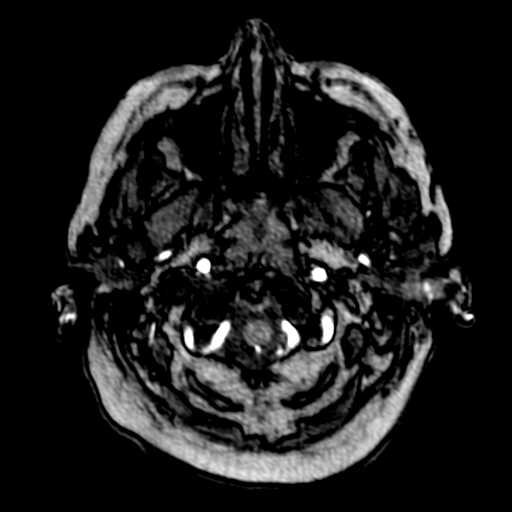
[im 31/205]
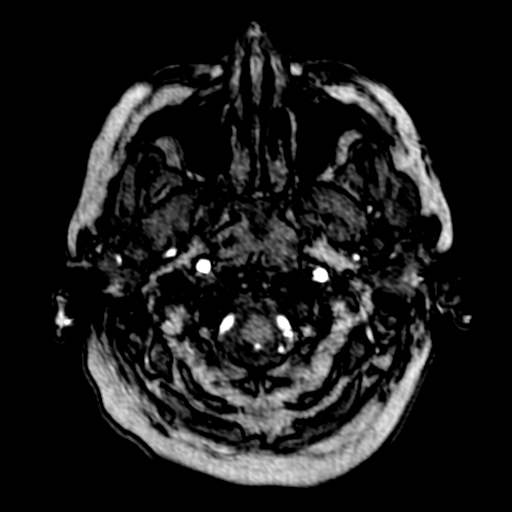
[im 35/205]
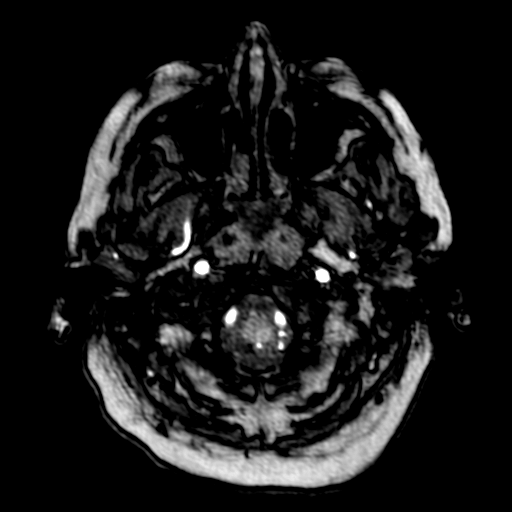
[im 40/205]
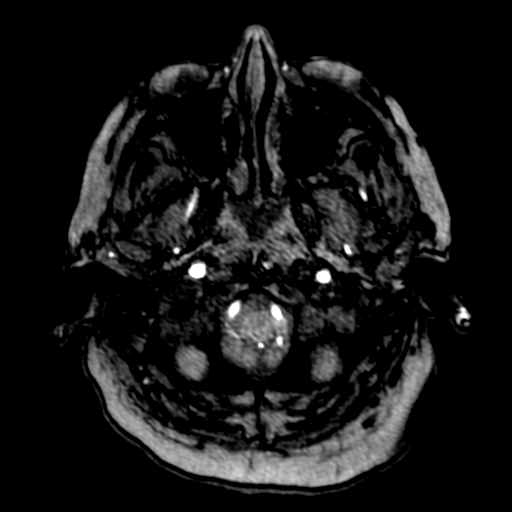
[im 44/205]
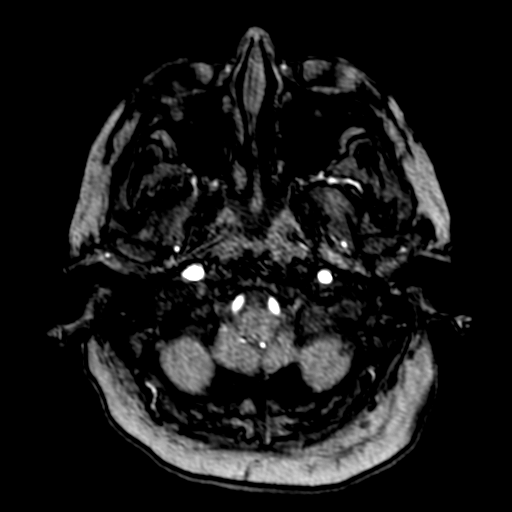
[im 66/205]
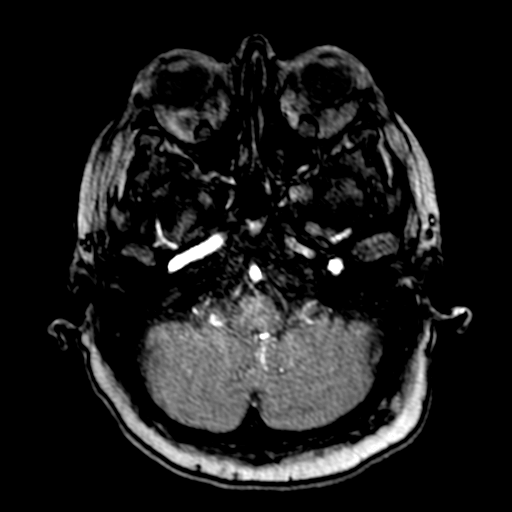
[im 92/205]
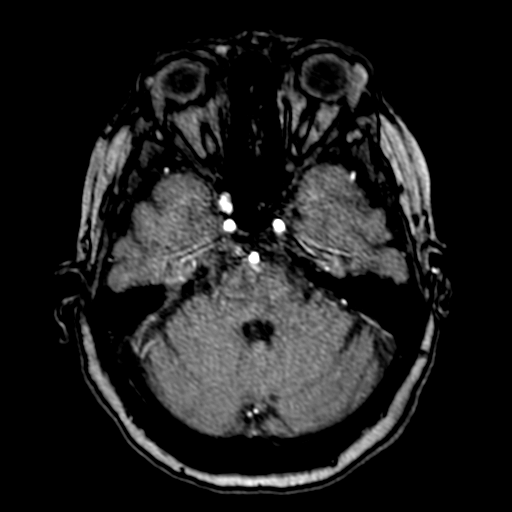
[im 105/205]
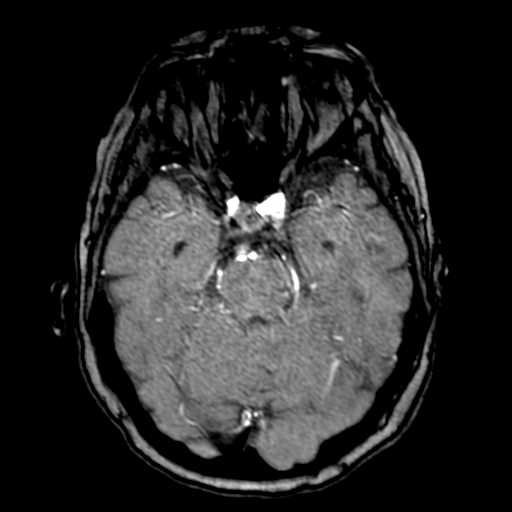
[im 118/205]
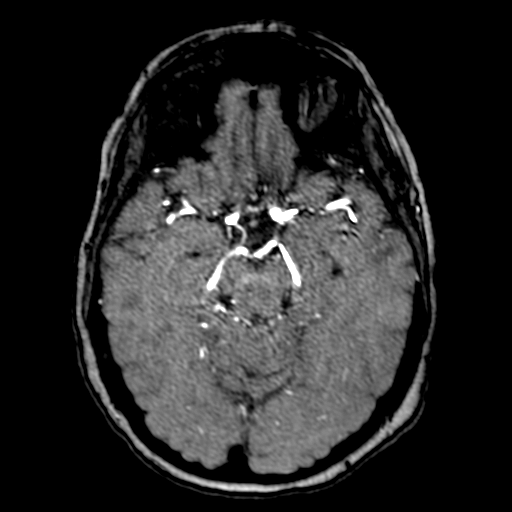
[im 144/205]
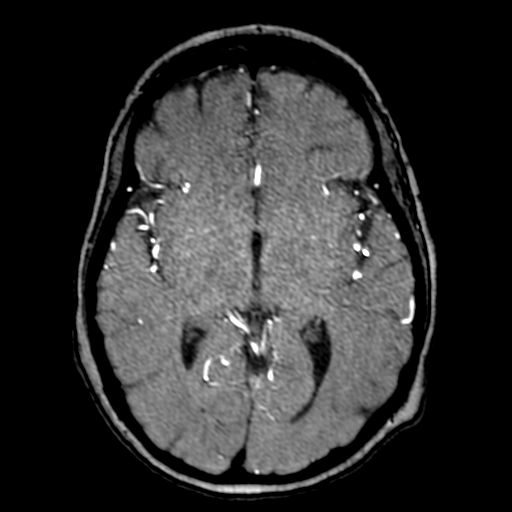
[im 170/205]
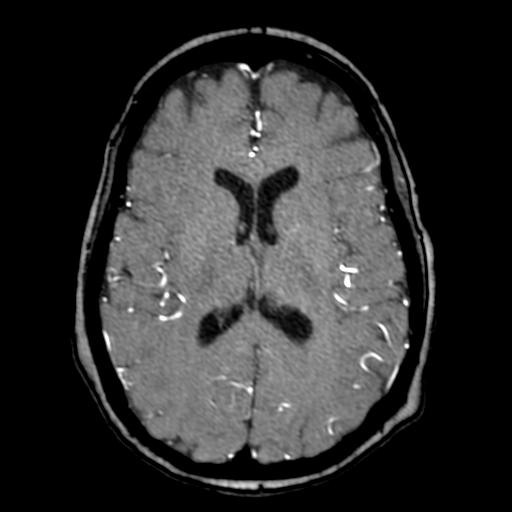
[im 174/205]
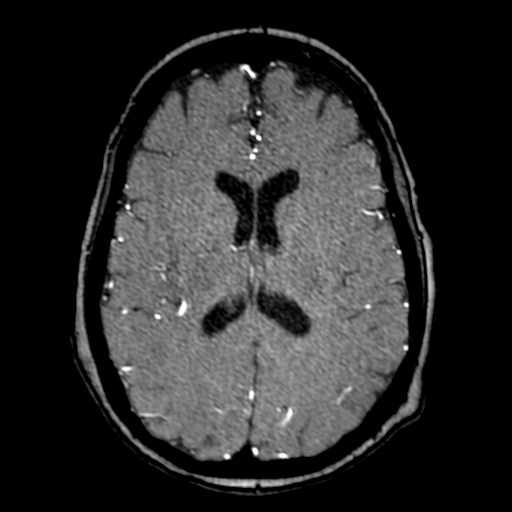
[im 196/205]
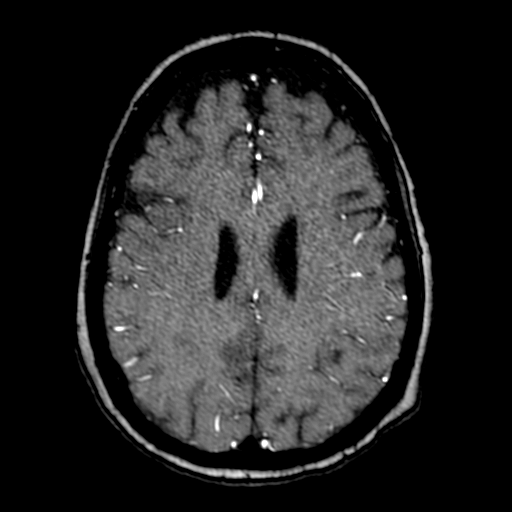

[19 of 48 positions shown; findings below may reference images not displayed]

FINDINGS: MRA NECK FINDINGS

Standard aortic branching. The bilateral carotid arteries and
vertebral arteries are patent, without stenosis (greater than 50%),
occlusion, or dissection.

MRA HEAD FINDINGS

Medially directed 6 mm outpouching from the distal left cavernous
carotid/ophthalmic segment (series 5, image 106), with a neck
measuring approximately 5 mm. Poor visualization of the right
cavernous carotid, which may indicate slow flow or stenosis (series
5, image 101). Both internal carotid arteries are otherwise patent
to the termini.

A1 segments patent. Normal anterior communicating artery. Anterior
cerebral arteries are patent to their distal aspects. No M1 stenosis
or occlusion. Normal MCA bifurcations. Distal MCA branches perfused
and symmetric.

Vertebral arteries widely patent to the vertebrobasilar junction
without stenosis. Posterior inferior cerebral arteries patent
bilaterally. Apparent narrowing of the basilar artery just proximal
to the takeoff the of the superior cerebellar arteries (series 5,
images 101-106), although this may be artifactual. Superior cerebral
arteries patent bilaterally. PCAs well perfused to their distal
aspects without stenosis. The bilateral posterior communicating
arteries are visualized.
IMPRESSION: 1. Medially directed 6 mm aneurysm from the distal left cavernous
carotid/ophthalmic segment.
2. Poor visualization of the right cavernous carotid, which may
indicate slow flow or stenosis. Consider CTA for further evaluation.
3. Apparent narrowing of the distal basilar artery, which may be
artifactual or indicate focal stenosis.
4. No hemodynamically significant stenosis in the neck.
5. No large vessel occlusion.

## 2021-02-01 IMAGING — CT CT ANGIO HEAD-NECK (W OR W/O PERF)
2 of 10 series · 7 of 36 positions shown · IV contrast (APPLIED)
Comparison: MR angiogram of the head and neck [DATE].

CLINICAL DATA: Neuro deficit, acute, stroke suspected. Abnormal MRA
head and neck.

EXAM:
CT ANGIOGRAPHY HEAD AND NECK
TECHNIQUE: Multidetector CT imaging of the head and neck was performed using
the standard protocol during bolus administration of intravenous
contrast. Multiplanar CT image reconstructions and MIPs were
obtained to evaluate the vascular anatomy. Carotid stenosis
measurements (when applicable) are obtained utilizing NASCET
criteria, using the distal internal carotid diameter as the
denominator.
CONTRAST:  75mL OMNIPAQUE IOHEXOL 350 MG/ML SOLN

[Series 10: ax thin · axial · 0.53mm/px · z∈[-229,-1]mm · 6 of 323 slices shown]
[im 47/323  soft-tissue]
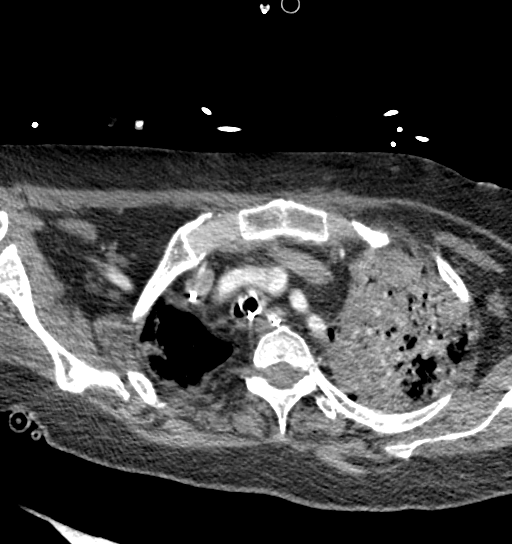
[im 93/323  bone]
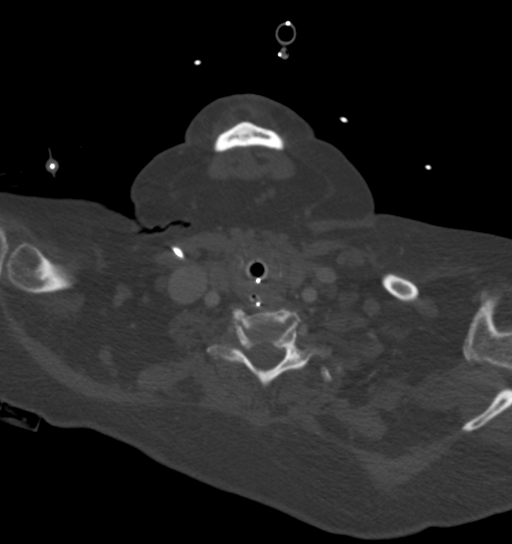
[im 139/323  soft-tissue]
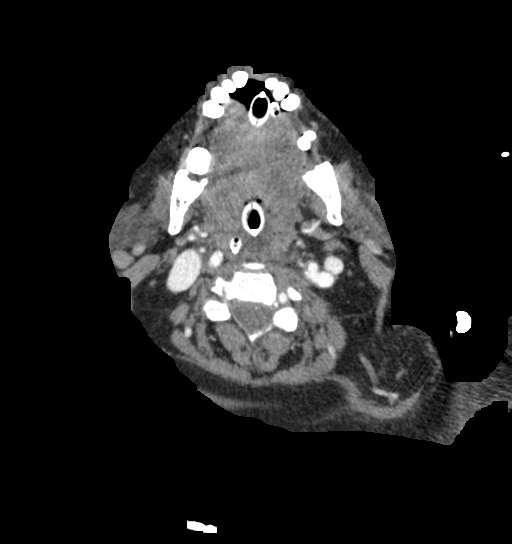
[im 185/323  bone]
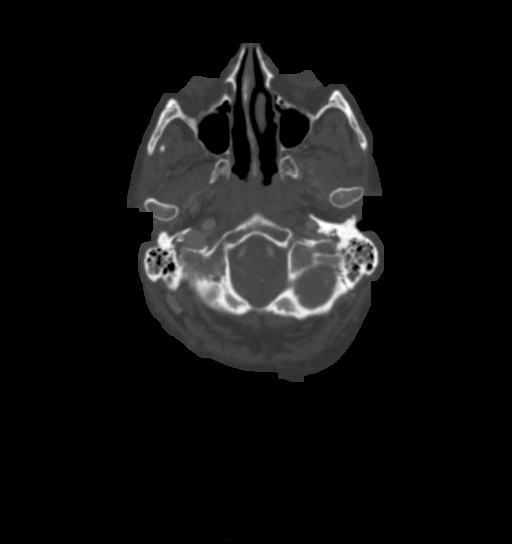
[im 231/323  soft-tissue]
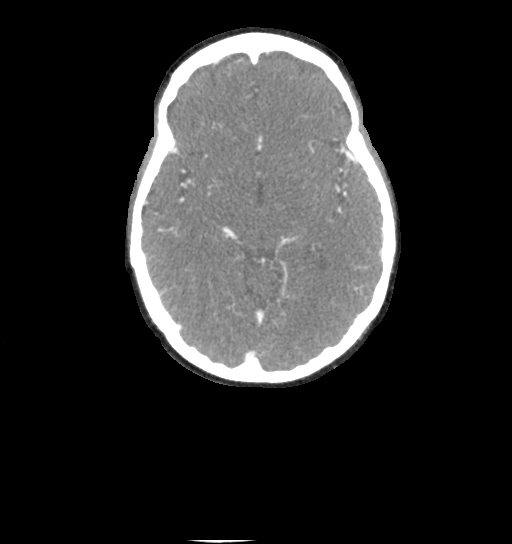
[im 277/323  bone]
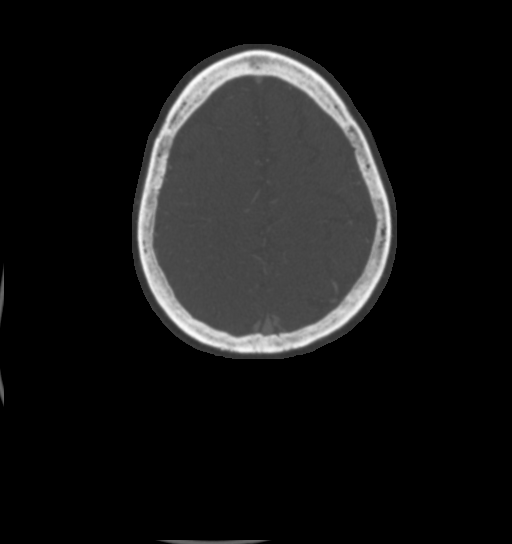

[Series 14: sagittal thin · sagittal · 0.50mm/px · 1 of 273 slices shown]
[im 90/273  soft-tissue]
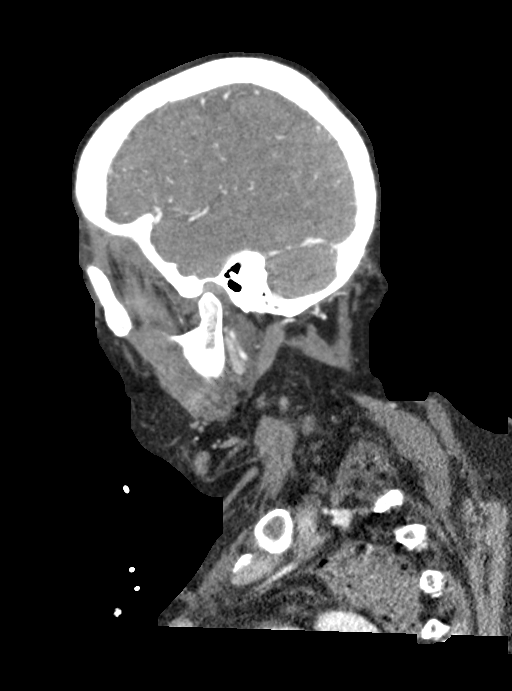

[7 of 36 positions shown; findings below may reference images not displayed]

FINDINGS: CT HEAD FINDINGS

Brain: No evidence of acute infarction, hemorrhage, hydrocephalus,
extra-axial collection or mass lesion/mass effect. Pontine infarct
is better seen on recent MRI of the brain.

Vascular: No hyperdense vessel or unexpected calcification.

Skull: Normal. Negative for fracture or focal lesion.

Sinuses: Imaged portions are clear.

Orbits: No acute finding.

Review of the MIP images confirms the above findings

CTA NECK FINDINGS

Aortic arch: Standard branching. Imaged portion shows no evidence of
aneurysm or dissection. No significant stenosis of the major arch
vessel origins.

Right carotid system: No evidence of dissection, stenosis (50% or
greater) or occlusion.

Left carotid system: Minimal atherosclerotic changes of the left
carotid bifurcation without hemodynamically significant stenosis.

Vertebral arteries: Codominant. No evidence of dissection, stenosis
(50% or greater) or occlusion.

Skeleton: No acute findings.

Other neck: Negative.

Upper chest: Extensive bilateral consolidations. Correlation with
dedicated study suggested.

Review of the MIP images confirms the above findings

CTA HEAD FINDINGS

Anterior circulation: Mild calcified plaques in the bilateral
carotid siphons without hemodynamically significant stenosis. A
medially projecting left paraclinoid ICA aneurysm measuring
approximately 6 mm. The bilateral ACA and MCA vascular trees are
maintained.

Posterior circulation: No significant stenosis, proximal occlusion,
aneurysm, or vascular malformation.

Venous sinuses: As permitted by contrast timing, patent.

Anatomic variants: None significant.

Review of the MIP images confirms the above findings
IMPRESSION: 1. No acute intracranial abnormality.
2. A 6 mm left paraclinoid ICA aneurysm. Consult to neuro
intervention suggested.
3. No intracranial large vessel occlusion or hemodynamically
significant stenosis. Intracranial stenosis described on prior MR
angiogram are likely artifactual.
4. Patent major neck arteries without hemodynamically significant
stenosis.

## 2021-02-01 MED ORDER — IOHEXOL 350 MG/ML SOLN
75.0000 mL | Freq: Once | INTRAVENOUS | Status: AC | PRN
  Administered 2021-02-01: 75 mL via INTRAVENOUS

## 2021-02-01 MED ORDER — FREE WATER
150.0000 mL | Status: DC
Start: 2021-02-01 — End: 2021-02-02
  Administered 2021-02-01 – 2021-02-02 (×7): 150 mL

## 2021-02-01 MED ORDER — GADOBUTROL 1 MMOL/ML IV SOLN
9.0000 mL | Freq: Once | INTRAVENOUS | Status: AC | PRN
  Administered 2021-02-01: 9 mL via INTRAVENOUS

## 2021-02-01 MED ORDER — POTASSIUM CHLORIDE 20 MEQ PO PACK
40.0000 meq | PACK | Freq: Once | ORAL | Status: AC
  Administered 2021-02-01: 40 meq
  Filled 2021-02-01: qty 2

## 2021-02-01 MED ORDER — POTASSIUM CHLORIDE 10 MEQ/50ML IV SOLN
10.0000 meq | INTRAVENOUS | Status: AC
  Administered 2021-02-01 (×2): 10 meq via INTRAVENOUS
  Filled 2021-02-01 (×2): qty 50

## 2021-02-01 MED ORDER — METHYLPREDNISOLONE SODIUM SUCC 40 MG IJ SOLR
40.0000 mg | INTRAMUSCULAR | Status: DC
  Administered 2021-02-01 – 2021-02-02 (×2): 40 mg via INTRAVENOUS
  Filled 2021-02-01 (×2): qty 1

## 2021-02-01 NOTE — Progress Notes (Signed)
RT assisted with patient transport to MRI while patient on the trilogy ventilator. No complications arose during trip. 

## 2021-02-01 NOTE — Progress Notes (Signed)
Subjective: Sedation turned off this AM. Densely encephalopathic. No seizures seen.   Objective: Current vital signs: BP (!) 129/51   Pulse 82   Temp 98.6 F (37 C)   Resp 17   Ht '5\' 5"'  (1.651 m)   Wt 92.3 kg   LMP 09/07/2015   SpO2 94%   BMI 33.86 kg/m  Vital signs in last 24 hours: Temp:  [98.6 F (37 C)-100.2 F (37.9 C)] 98.6 F (37 C) (10/31 2200) Pulse Rate:  [72-94] 82 (10/31 2200) Resp:  [10-23] 17 (10/31 2200) BP: (95-133)/(47-61) 129/51 (10/31 2200) SpO2:  [89 %-97 %] 94 % (10/31 2200) FiO2 (%):  [50 %] 50 % (10/31 1920) Weight:  [92.3 kg] 92.3 kg (10/31 0425)  Intake/Output from previous day: 10/30 0701 - 10/31 0700 In: 2108.5 [I.V.:179.7; NG/GT:1653.1; IV Piggyback:225.7] Out: 2190 [Urine:1690; Stool:500] Intake/Output this shift: No intake/output data recorded. Nutritional status:  Diet Order             Diet NPO time specified  Diet effective now                  HEENT: Scleral edema with icterus Lungs: Intubated Skin: Extremity edema noted.   Neurologic Exam: Ment: Furrows brow to noxious. No responses to verbal stimuli. No attempts to communicate.  CN: PERRL and sluggish. Doll's eye reflex intact. No nystagmus. Eyes are conjugate. Face symmetric. Will furrow brow and blink to corneal stimulation bilaterally. Intact cough reflex. Motor/Sensory: Flaccid tone x 4. No movement to light noxious Reflexes: Hypoactive x 4   Lab Results: Results for orders placed or performed during the hospital encounter of 01/20/21 (from the past 48 hour(s))  Glucose, capillary     Status: Abnormal   Collection Time: 01/30/21 11:41 PM  Result Value Ref Range   Glucose-Capillary 181 (H) 70 - 99 mg/dL    Comment: Glucose reference range applies only to samples taken after fasting for at least 8 hours.   Comment 1 Notify RN    Comment 2 Document in Chart   Glucose, capillary     Status: Abnormal   Collection Time: 01/31/21  3:55 AM  Result Value Ref Range    Glucose-Capillary 184 (H) 70 - 99 mg/dL    Comment: Glucose reference range applies only to samples taken after fasting for at least 8 hours.   Comment 1 Notify RN    Comment 2 Document in Chart   Magnesium     Status: None   Collection Time: 01/31/21  4:08 AM  Result Value Ref Range   Magnesium 2.1 1.7 - 2.4 mg/dL    Comment: Performed at [facility], [address] [place], [place] [address]  Phosphorus     Status: None   Collection Time: 01/31/21  4:08 AM  Result Value Ref Range   Phosphorus 2.6 2.5 - 4.6 mg/dL    Comment: Performed at [facility], [address] [place],  [address]  CBC     Status: Abnormal   Collection Time: 01/31/21  4:08 AM  Result Value Ref Range   WBC 17.5 (H) 4.0 - 10.5 K/uL   RBC 2.24 (L) 3.87 - 5.11 MIL/uL   Hemoglobin 8.6 (L) 12.0 - 15.0 g/dL   HCT 27.7 (L) 36.0 - 46.0 %   MCV 123.7 (H) 80.0 - 100.0 fL   MCH 38.4 (H) 26.0 - 34.0 pg   MCHC 31.0 30.0 - 36.0 g/dL   RDW 25.2 (H) 11.5 - 15.5 %  Platelets 158 150 - 400 K/uL   nRBC 0.1 0.0 - 0.2 %    Comment: Performed at [facility], [address] [place], [place] [address]  Comprehensive metabolic panel     Status: Abnormal   Collection Time: 01/31/21  4:08 AM  Result Value Ref Range   Sodium 146 (H) 135 - 145 mmol/L   Potassium 3.6 3.5 - 5.1 mmol/L   Chloride 107 98 - 111 mmol/L   CO2 33 (H) 22 - 32 mmol/L   Glucose, Bld 206 (H) 70 - 99 mg/dL    Comment: Glucose reference range applies only to samples taken after fasting for at least 8 hours.   BUN 49 (H) 6 - 20 mg/dL   Creatinine, Ser 0.89 0.44 - 1.00 mg/dL   Calcium 8.7 (L) 8.9 - 10.3 mg/dL   Total Protein 6.7 6.5 - 8.1 g/dL   Albumin 2.3 (L) 3.5 - 5.0 g/dL   AST 239 (H) 15 - 41 U/L   ALT 83 (H) 0 - 44 U/L   Alkaline Phosphatase 123 38 - 126 U/L   Total Bilirubin 6.6 (H) 0.3 - 1.2 mg/dL   GFR, Estimated >60 >60 mL/min    Comment: (NOTE) Calculated using the CKD-EPI Creatinine Equation (2021)     Anion gap 6 5 - 15    Comment: Performed at [facility], [address] [place], [place] [address]  Ammonia     Status: Abnormal   Collection Time: 01/31/21  4:08 AM  Result Value Ref Range   Ammonia 87 (H) 9 - 35 umol/L    Comment: Performed at [facility], [address] [place], [place] [address]  Procalcitonin     Status: None   Collection Time: 01/31/21  4:08 AM  Result Value Ref Range   Procalcitonin 0.90 ng/mL    Comment:        Interpretation: PCT > 0.5 ng/mL and <= 2 ng/mL: Systemic infection (sepsis) is possible, but other conditions are known to elevate PCT as well. (NOTE)       Sepsis PCT Algorithm           Lower Respiratory Tract                                      Infection PCT Algorithm    ----------------------------     ----------------------------         PCT < 0.25 ng/mL                PCT < 0.10 ng/mL          Strongly encourage             Strongly discourage   discontinuation of antibiotics    initiation of antibiotics    ----------------------------     -----------------------------       PCT 0.25 - 0.50 ng/mL            PCT 0.10 - 0.25 ng/mL               OR       >80% decrease in PCT            Discourage initiation of  antibiotics      Encourage discontinuation           of antibiotics    ----------------------------     -----------------------------         PCT >= 0.50 ng/mL              PCT 0.26 - 0.50 ng/mL                AND       <80% decrease in PCT             Encourage initiation of                                             antibiotics       Encourage continuation           of antibiotics    ----------------------------     -----------------------------        PCT >= 0.50 ng/mL                  PCT > 0.50 ng/mL               AND         increase in PCT                  Strongly encourage                                      initiation of antibiotics    Strongly encourage  escalation           of antibiotics                                     -----------------------------                                           PCT <= 0.25 ng/mL                                                 OR                                        > 80% decrease in PCT                                      Discontinue / Do not initiate                                             antibiotics  Performed at [facility], [address], [place], [place] [address]   Brain natriuretic peptide     Status: Abnormal   Collection  Time: 01/31/21  4:08 AM  Result Value Ref Range   B Natriuretic Peptide 996.6 (H) 0.0 - 100.0 pg/mL    Comment: Performed at [facility], [address] [place], [place] [address]  TSH     Status: None   Collection Time: 01/31/21  4:08 AM  Result Value Ref Range   TSH 2.202 0.350 - 4.500 uIU/mL    Comment: Performed by a 3rd Generation assay with a functional sensitivity of <=0.01 uIU/mL. Performed at [facility], [address] [place], [place] [address]   Lipid panel     Status: Abnormal   Collection Time: 01/31/21  4:08 AM  Result Value Ref Range   Cholesterol 127 0 - 200 mg/dL   Triglycerides 211 (H) <150 mg/dL   HDL <10 (L) >40 mg/dL   Total CHOL/HDL Ratio NOT CALCULATED RATIO   VLDL 42 (H) 0 - 40 mg/dL   LDL Cholesterol NOT CALCULATED 0 - 99 mg/dL    Comment: Performed at [facility], [address] [place], [place] [address]  Glucose, capillary     Status: Abnormal   Collection Time: 01/31/21  8:12 AM  Result Value Ref Range   Glucose-Capillary 169 (H) 70 - 99 mg/dL    Comment: Glucose reference range applies only to samples taken after fasting for at least 8 hours.  Glucose, capillary     Status: Abnormal   Collection Time: 01/31/21 11:48 AM  Result Value Ref Range   Glucose-Capillary 170 (H) 70 - 99 mg/dL    Comment: Glucose reference range applies only to samples taken after fasting for at least 8  hours.  Glucose, capillary     Status: Abnormal   Collection Time: 01/31/21  3:57 PM  Result Value Ref Range   Glucose-Capillary 201 (H) 70 - 99 mg/dL    Comment: Glucose reference range applies only to samples taken after fasting for at least 8 hours.  Glucose, capillary     Status: Abnormal   Collection Time: 01/31/21  7:42 PM  Result Value Ref Range   Glucose-Capillary 190 (H) 70 - 99 mg/dL    Comment: Glucose reference range applies only to samples taken after fasting for at least 8 hours.  Glucose, capillary     Status: Abnormal   Collection Time: 01/31/21 11:19 PM  Result Value Ref Range   Glucose-Capillary 177 (H) 70 - 99 mg/dL    Comment: Glucose reference range applies only to samples taken after fasting for at least 8 hours.  Glucose, capillary     Status: Abnormal   Collection Time: 02/01/21  3:44 AM  Result Value Ref Range   Glucose-Capillary 156 (H) 70 - 99 mg/dL    Comment: Glucose reference range applies only to samples taken after fasting for at least 8 hours.  Magnesium     Status: None   Collection Time: 02/01/21  4:39 AM  Result Value Ref Range   Magnesium 1.9 1.7 - 2.4 mg/dL    Comment: Performed at [facility], [address] [place], [place] [address]  Phosphorus     Status: None   Collection Time: 02/01/21  4:39 AM  Result Value Ref Range   Phosphorus 2.5 2.5 - 4.6 mg/dL    Comment: Performed at [facility], [address] [place], [place] [address]  CBC     Status: Abnormal   Collection Time: 02/01/21  4:39 AM  Result Value Ref Range   WBC 19.6 (H) 4.0 -  10.5 K/uL   RBC 2.22 (L) 3.87 - 5.11 MIL/uL   Hemoglobin 8.6 (L) 12.0 - 15.0 g/dL   HCT 27.7 (L) 36.0 - 46.0 %   MCV 124.8 (H) 80.0 - 100.0 fL   MCH 38.7 (H) 26.0 - 34.0 pg   MCHC 31.0 30.0 - 36.0 g/dL   RDW 25.2 (H) 11.5 - 15.5 %   Platelets 204 150 - 400 K/uL   nRBC 0.2 0.0 - 0.2 %    Comment: Performed at [facility], [address] [place], [place] [address]   Comprehensive metabolic panel     Status: Abnormal   Collection Time: 02/01/21  4:39 AM  Result Value Ref Range   Sodium 149 (H) 135 - 145 mmol/L   Potassium 3.3 (L) 3.5 - 5.1 mmol/L   Chloride 108 98 - 111 mmol/L   CO2 34 (H) 22 - 32 mmol/L   Glucose, Bld 183 (H) 70 - 99 mg/dL    Comment: Glucose reference range applies only to samples taken after fasting for at least 8 hours.   BUN 54 (H) 6 - 20 mg/dL   Creatinine, Ser 0.77 0.44 - 1.00 mg/dL   Calcium 8.8 (L) 8.9 - 10.3 mg/dL   Total Protein 6.9 6.5 - 8.1 g/dL   Albumin 2.6 (L) 3.5 - 5.0 g/dL   AST 236 (H) 15 - 41 U/L   ALT 87 (H) 0 - 44 U/L   Alkaline Phosphatase 126 38 - 126 U/L   Total Bilirubin 5.7 (H) 0.3 - 1.2 mg/dL   GFR, Estimated >60 >60 mL/min    Comment: (NOTE) Calculated using the CKD-EPI Creatinine Equation (2021)    Anion gap 7 5 - 15    Comment: Performed at [facility], [address] [place], [place] [address]  Glucose, capillary     Status: Abnormal   Collection Time: 02/01/21  7:51 AM  Result Value Ref Range   Glucose-Capillary 160 (H) 70 - 99 mg/dL    Comment: Glucose reference range applies only to samples taken after fasting for at least 8 hours.  Ammonia     Status: Abnormal   Collection Time: 02/01/21  7:59 AM  Result Value Ref Range   Ammonia 47 (H) 9 - 35 umol/L    Comment: Performed at [facility], [address] [place], [place] [address]  Glucose, capillary     Status: Abnormal   Collection Time: 02/01/21 12:30 PM  Result Value Ref Range   Glucose-Capillary 166 (H) 70 - 99 mg/dL    Comment: Glucose reference range applies only to samples taken after fasting for at least 8 hours.  Potassium     Status: None   Collection Time: 02/01/21  1:45 PM  Result Value Ref Range   Potassium 4.3 3.5 - 5.1 mmol/L    Comment: Performed at [facility], [address] [place], [place] [address]  Glucose, capillary     Status: Abnormal   Collection Time: 02/01/21  3:48 PM   Result Value Ref Range   Glucose-Capillary 214 (H) 70 - 99 mg/dL    Comment: Glucose reference range applies only to samples taken after fasting for at least 8 hours.  Glucose, capillary     Status: Abnormal   Collection Time: 02/01/21  7:39 PM  Result Value Ref Range   Glucose-Capillary 241 (H) 70 - 99 mg/dL    Comment: Glucose reference range applies only to samples taken after fasting for at least 8  hours.    Recent Results (from the past 240 hour(s))  Culture, Respiratory w Gram Stain     Status: None   Collection Time: 01/29/21  9:14 AM   Specimen: Tracheal Aspirate; Respiratory  Result Value Ref Range Status   Specimen Description   Final    TRACHEAL ASPIRATE Performed at [facility], [address], [place], [place] [address]    Special Requests   Final    NONE Performed at [facility], [place], [place] [address]    Gram Stain   Final    ABUNDANT WBC PRESENT,BOTH PMN AND MONONUCLEAR RARE YEAST    Culture   Final    RARE Normal respiratory flora-no Staph aureus or Pseudomonas seen Performed at [facility] [address] [place], [place] [address]    Report Status 01/31/2021 FINAL  Final  MRSA Next Gen by PCR, Nasal     Status: None   Collection Time: 01/29/21 11:35 AM   Specimen: Nasal Mucosa; Nasal Swab  Result Value Ref Range Status   MRSA by PCR Next Gen NOT DETECTED NOT DETECTED Final    Comment: (NOTE) The GeneXpert MRSA Assay (FDA approved for NASAL specimens only), is one component of a comprehensive MRSA colonization surveillance program. It is not intended to diagnose MRSA infection nor to guide or monitor treatment for MRSA infections. Test performance is not FDA approved in patients less than 26 years old. Performed at [facility], [address] [place], [place] [address]     Lipid Panel Recent Labs    01/31/21 0408  CHOL 127  TRIG 211*  HDL <10*  CHOLHDL NOT CALCULATED  VLDL 42*   LDLCALC NOT CALCULATED    Studies/Results: CT ANGIO HEAD NECK W WO CM  Result Date: 02/01/2021 CLINICAL DATA:  Neuro deficit, acute, stroke suspected. Abnormal MRA head and neck. EXAM: CT ANGIOGRAPHY HEAD AND NECK TECHNIQUE: Multidetector CT imaging of the head and neck was performed using the standard protocol during bolus administration of intravenous contrast. Multiplanar CT image reconstructions and MIPs were obtained to evaluate the vascular anatomy. Carotid stenosis measurements (when applicable) are obtained utilizing NASCET criteria, using the distal internal carotid diameter as the denominator. CONTRAST:  59m OMNIPAQUE IOHEXOL 350 MG/ML SOLN COMPARISON:  MR angiogram of the head and neck February 01, 2021. FINDINGS: CT HEAD FINDINGS Brain: No evidence of acute infarction, hemorrhage, hydrocephalus, extra-axial collection or mass lesion/mass effect. Pontine infarct is better seen on recent MRI of the brain. Vascular: No hyperdense vessel or unexpected calcification. Skull: Normal. Negative for fracture or focal lesion. Sinuses: Imaged portions are clear. Orbits: No acute finding. Review of the MIP images confirms the above findings CTA NECK FINDINGS Aortic arch: Standard branching. Imaged portion shows no evidence of aneurysm or dissection. No significant stenosis of the major arch vessel origins. Right carotid system: No evidence of dissection, stenosis (50% or greater) or occlusion. Left carotid system: Minimal atherosclerotic changes of the left carotid bifurcation without hemodynamically significant stenosis. Vertebral arteries: Codominant. No evidence of dissection, stenosis (50% or greater) or occlusion. Skeleton: No acute findings. Other neck: Negative. Upper chest: Extensive bilateral consolidations. Correlation with dedicated study suggested. Review of the MIP images confirms the above findings CTA HEAD FINDINGS Anterior circulation: Mild calcified plaques in the bilateral carotid siphons  without hemodynamically significant stenosis. A medially projecting left paraclinoid ICA aneurysm measuring approximately 6 mm. The bilateral ACA and MCA vascular trees are maintained. Posterior circulation: No significant  stenosis, proximal occlusion, aneurysm, or vascular malformation. Venous sinuses: As permitted by contrast timing, patent. Anatomic variants: None significant. Review of the MIP images confirms the above findings IMPRESSION: 1. No acute intracranial abnormality. 2. A 6 mm left paraclinoid ICA aneurysm. Consult to neuro intervention suggested. 3. No intracranial large vessel occlusion or hemodynamically significant stenosis. Intracranial stenosis described on prior MR angiogram are likely artifactual. 4. Patent major neck arteries without hemodynamically significant stenosis. Electronically Signed   By: Pedro Earls M.D.   On: 02/01/2021 17:15   MR ANGIO HEAD WO CONTRAST  Result Date: 02/01/2021 CLINICAL DATA:  Neuro deficit, stroke suspected EXAM: MRA NECK WITHOUT AND WITH CONTRAST MRA HEAD WITHOUT CONTRAST TECHNIQUE: Multiplanar and multiecho pulse sequences of the neck were obtained without and with intravenous contrast. Angiographic images of the neck were obtained using MRA technique without and with intravenous contrast; Angiographic images of the Circle of Willis were obtained using MRA technique without intravenous contrast. CONTRAST:  75m GADAVIST GADOBUTROL 1 MMOL/ML IV SOLN COMPARISON:  Correlation is made with MRI 01/31/2021, no prior neurovascular imaging. FINDINGS: MRA NECK FINDINGS Standard aortic branching. The bilateral carotid arteries and vertebral arteries are patent, without stenosis (greater than 50%), occlusion, or dissection. MRA HEAD FINDINGS Medially directed 6 mm outpouching from the distal left cavernous carotid/ophthalmic segment (series 5, image 106), with a neck measuring approximately 5 mm. Poor visualization of the right cavernous carotid,  which may indicate slow flow or stenosis (series 5, image 101). Both internal carotid arteries are otherwise patent to the termini. A1 segments patent. Normal anterior communicating artery. Anterior cerebral arteries are patent to their distal aspects. No M1 stenosis or occlusion. Normal MCA bifurcations. Distal MCA branches perfused and symmetric. Vertebral arteries widely patent to the vertebrobasilar junction without stenosis. Posterior inferior cerebral arteries patent bilaterally. Apparent narrowing of the basilar artery just proximal to the takeoff the of the superior cerebellar arteries (series 5, images 101-106), although this may be artifactual. Superior cerebral arteries patent bilaterally. PCAs well perfused to their distal aspects without stenosis. The bilateral posterior communicating arteries are visualized. IMPRESSION: 1. Medially directed 6 mm aneurysm from the distal left cavernous carotid/ophthalmic segment. 2. Poor visualization of the right cavernous carotid, which may indicate slow flow or stenosis. Consider CTA for further evaluation. 3. Apparent narrowing of the distal basilar artery, which may be artifactual or indicate focal stenosis. 4. No hemodynamically significant stenosis in the neck. 5. No large vessel occlusion. Electronically Signed   By: AMerilyn BabaM.D.   On: 02/01/2021 13:18   MR ANGIO NECK W WO CONTRAST  Result Date: 02/01/2021 CLINICAL DATA:  Neuro deficit, stroke suspected EXAM: MRA NECK WITHOUT AND WITH CONTRAST MRA HEAD WITHOUT CONTRAST TECHNIQUE: Multiplanar and multiecho pulse sequences of the neck were obtained without and with intravenous contrast. Angiographic images of the neck were obtained using MRA technique without and with intravenous contrast; Angiographic images of the Circle of Willis were obtained using MRA technique without intravenous contrast. CONTRAST:  972mGADAVIST GADOBUTROL 1 MMOL/ML IV SOLN COMPARISON:  Correlation is made with MRI 01/31/2021, no  prior neurovascular imaging. FINDINGS: MRA NECK FINDINGS Standard aortic branching. The bilateral carotid arteries and vertebral arteries are patent, without stenosis (greater than 50%), occlusion, or dissection. MRA HEAD FINDINGS Medially directed 6 mm outpouching from the distal left cavernous carotid/ophthalmic segment (series 5, image 106), with a neck measuring approximately 5 mm. Poor visualization of the right cavernous carotid, which may indicate slow flow or stenosis (series  5, image 101). Both internal carotid arteries are otherwise patent to the termini. A1 segments patent. Normal anterior communicating artery. Anterior cerebral arteries are patent to their distal aspects. No M1 stenosis or occlusion. Normal MCA bifurcations. Distal MCA branches perfused and symmetric. Vertebral arteries widely patent to the vertebrobasilar junction without stenosis. Posterior inferior cerebral arteries patent bilaterally. Apparent narrowing of the basilar artery just proximal to the takeoff the of the superior cerebellar arteries (series 5, images 101-106), although this may be artifactual. Superior cerebral arteries patent bilaterally. PCAs well perfused to their distal aspects without stenosis. The bilateral posterior communicating arteries are visualized. IMPRESSION: 1. Medially directed 6 mm aneurysm from the distal left cavernous carotid/ophthalmic segment. 2. Poor visualization of the right cavernous carotid, which may indicate slow flow or stenosis. Consider CTA for further evaluation. 3. Apparent narrowing of the distal basilar artery, which may be artifactual or indicate focal stenosis. 4. No hemodynamically significant stenosis in the neck. 5. No large vessel occlusion. Electronically Signed   By: Merilyn Baba M.D.   On: 02/01/2021 13:18   MR BRAIN WO CONTRAST  Result Date: 01/31/2021 CLINICAL DATA:  Delirium.  Encephalopathy. EXAM: MRI HEAD WITHOUT CONTRAST TECHNIQUE: Multiplanar, multiecho pulse  sequences of the brain and surrounding structures were obtained without intravenous contrast. 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 Symmetric T2 signal changes and restricted diffusion is present in the thalami bilaterally. No other significant white matter disease is present. The ventricles are of normal size. No significant extraaxial fluid collection is present. The internal auditory canals are within normal limits. Brainstem and cerebellum are otherwise unremarkable. Vascular: Flow is present in the major intracranial arteries. Skull and upper cervical spine: The craniocervical junction is normal. Upper cervical spine is within normal limits. Marrow signal is unremarkable. Sinuses/Orbits: Fluid is present in the nasopharynx, likely related to intubation. Bilateral mastoid effusions are present. Minimal mucosal thickening is present in the sphenoid sinuses. Paranasal sinuses are otherwise clear. The globes and orbits are within normal limits. IMPRESSION: 1. 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 2. Symmetric T2 signal changes and restricted diffusion in the thalami bilaterally. This is nonspecific, but may represent artery of [place] infarcts. 3. Bilateral mastoid effusions. Fluid in the nasopharynx, likely related to intubation. Electronically Signed   By: San Morelle M.D.   On: 01/31/2021 13:10   DG Chest Port 1 View  Result Date: 02/01/2021 CLINICAL DATA:  Respiratory failure EXAM: PORTABLE CHEST 1 VIEW COMPARISON:  01/30/2021 FINDINGS: Stable positioning of endotracheal tube with tip approximately 2.5 cm above the carina. Central line position stable. Gastric decompression tube extends into the stomach. Lungs demonstrate persistent severe airspace disease throughout the left lung and some more localized airspace disease in the right upper  lung with slightly improved aeration of the right lower lung. No pneumothorax or significant pleural fluid volume. Stable heart size. IMPRESSION: Stable airspace disease throughout the left lung. Airspace disease localized to the right upper lobe. Slightly improved aeration of the right lower lung. Electronically Signed   By: Aletta Edouard M.D.   On: 02/01/2021 08:23   EEG adult  Result Date: 02/01/2021 Lora Havens, MD     02/01/2021  1:41 PM Patient Name: [name] Carrillo MRN: [id number] Epilepsy Attending: Lora Havens Referring Physician/Provider: Dr Su Monks Date: 02/01/2021 Duration: 23.34 mins Patient history: 55 year old female with altered mental status.  EEG to evaluate for seizure. Level of alertness: lethargic AEDs during EEG study: None Technical aspects: This EEG study was done with scalp electrodes positioned according to the 10-20 International system of electrode placement. Electrical activity was acquired at a sampling rate of '500Hz'  and reviewed with a high frequency filter of '70Hz'  and a low frequency filter of '1Hz' . EEG data were recorded continuously and digitally stored. Description: EEG showed continuous generalized 3 to 6 Hz theta-delta slowing.  Hyperventilation and photic stimulation were not performed.   ABNORMALITY - Continuous slow, generalized IMPRESSION: This study is suggestive of moderate to severe diffuse encephalopathy, nonspecific etiology. No seizures or epileptiform discharges were seen throughout the recording. Priyanka O Yadav   [place] EKG SITE RITE  Result Date: 02/01/2021 If Site Rite image not attached, placement could not be confirmed due to current cardiac rhythm.   Medications: Scheduled:  vitamin C  500 mg Per Tube BID   aspirin  81 mg Per Tube Daily   chlorhexidine gluconate (MEDLINE KIT)  15 mL Mouth Rinse BID   Chlorhexidine Gluconate Cloth  6 each Topical Daily   docusate  100 mg Per Tube BID   feeding supplement (PROSource TF)  45 mL Per  Tube Daily   folic acid  1 mg Per Tube Daily   free water  150 mL Per Tube Q4H   heparin injection (subcutaneous)  5,000 Units Subcutaneous Q8H   influenza vac split quadrivalent PF  0.5 mL Intramuscular Tomorrow-1000   insulin aspart  0-15 Units Subcutaneous Q4H   lactulose  30 g Per Tube BID   mouth rinse  15 mL Mouth Rinse 10 times per day   methylPREDNISolone (SOLU-MEDROL) injection  40 mg Intravenous Q24H   midodrine  10 mg Per Tube TID WC   multivitamin with minerals  1 tablet Per Tube Daily   pantoprazole sodium  40 mg Per Tube Q1200   polyethylene glycol  17 g Per Tube Daily   rifaximin  550 mg Per Tube BID   sodium chloride flush  10-40 mL Intracatheter Q12H   thiamine injection  100 mg Intravenous Daily   zinc sulfate  220 mg Per Tube Daily   Continuous:  sodium chloride 10 mL/hr at 02/01/21 1900   dexmedetomidine (PRECEDEX) IV infusion Stopped (02/01/21 1122)   feeding supplement (VITAL AF 1.2 CAL) 1,000 mL (02/01/21 2155)   norepinephrine (LEVOPHED) Adult infusion Stopped (01/30/21 1017)   piperacillin-tazobactam (ZOSYN)  IV 3.375 g (02/01/21 2144)    Assessment: 55 yo woman with hx MDD, EtOH abuse (active c/b), liver cirrhosis, tobacco abuse, multiple previous hospital admissions for EtOH withdrawal who presented to ED 01/20/21 with hepatic encephalopathy, jaundice, and abd pain. She was admitted to floor but ultimately trasferred to ICU for unstable vital signs and hyperactive delirium c/f DTs. MRI brain 10/30 showed acute/subacute paramedian pontine ischemic infarct. Encephalopathy likely multifactorial in setting of hyperammonemia, hepatitis, aspiration PNA, acute/subacute ischemic pontine infarct.  - Suspect >72 hrs from acute ischemic event based on imaging. Goal normotension, avoid hypotension. Continue midodrine 52m tid for hypotension. - ASA 832mdaily - Holding statin in the setting of hepatitis - MRI brain: Acute/subacute 9 mm right paramedian nonhemorrhagic  infarct in the central pons. Symmetric T2 signal changes and restricted diffusion in the thalami bilaterally. This is nonspecific, but may represent artery of Percheron infarcts versus hepatic encephalopathy changes given the striking symmetry of the thalamic lesions - CTA head and neck: No acute intracranial abnormality. A 6 mm left paraclinoid  ICA aneurysm. Consult to neuro intervention suggested. No intracranial large vessel occlusion or hemodynamically significant stenosis. Intracranial stenosis described on prior MR angiogram are likely artifactual. Patent major neck arteries without hemodynamically significant stenosis. - EEG: This study is suggestive of moderate to severe diffuse encephalopathy, nonspecific etiology. No seizures or epileptiform discharges were seen throughout the recording.   Recommendations: - q4 hr neuro checks - Inpatient seizure precautions - STAT head CT for any change in neuro exam - Telemetry - PT/OT/SLP - Stroke education when she regains consciousness - Amb referral to neurology upon discharge #Delirium tremens: Continue thiamine 175m IV daily. CIWA protocol.   35 minutes spent in the neurological evaluation and management of this critically ill patient   LOS: 12 days   '@Electronically'  signed: Dr. EKerney Elbe10/31/2022  10:30 PM

## 2021-02-01 NOTE — Progress Notes (Signed)
Chaplain Maggie offered silent prayer at bedside. Pt was unresponsive. Chaplain expects to follow up.

## 2021-02-01 NOTE — Consult Note (Signed)
NEUROLOGY CONSULTATION NOTE   Date of service: February 01, 2021 Patient Name: [name] Carrillo MRN:  [id number] DOB:  Oct 06, 1965 Reason for consult: persistent encephalopathy, acute ischemic pontine stroke Requesting physician: Otelia Limes MD _ _ _   _ __   _ __ _ _  __ __   _ __   __ _  History of Present Illness   55 yo woman with hx MDD, EtOH abuse (active c/b liver cirrhosis, tobacco abuse, multiple previous hospital admissions for EtOH withdrawal who presented to ED 01/20/21 with hepatic encephalopathy, jaundice, and abd pain. She was admitted to floor but ultimately trasferred to ICU for unstable vital signs and hyperactive delirium c/f DTs. Upon transfer to ICU pt was intubated for airway protection.   MRI brain 10/30 showed acute/subacute paramedian pontine ischemic infarct. Hospitalization has been additionally complicated by acute alcoholic hepatitis, transaminitis with decreased synthetic function, hyperammonemia (peak 132 --> 87), AKI, acute hypoxic respiratory failure, aspiration PNA, UTI 2/2 E. Coli and klebsiella, thrombocytopenia (plts122->204). WBC today 19.6, stable x3 days. Patient has persistent encephalopathy, when precedex is stopped she has only brainstem reflexes intact, does not follow commands or attend examiner, no movement any extremity to noxious stimuli.  Relevant current medications Hepatic encephalopathy - lactulose, rifaximin Acute ischemic stroke - ASA 21m daily Aspiration PNA - zosyn Sedation - precedex (weaning) Hypotension - midodrine, was previously on pressors  Stroke workup this admission:  MRI brain wo contrast 1. 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 2. Symmetric T2 signal changes and restricted diffusion in the thalami bilaterally. This is nonspecific, but may represent artery of Percheron infarcts. 3. Bilateral mastoid effusions. Fluid in the nasopharynx, likely related to intubation.  CNS  imaging personally reviewed; I agree with above interpretation  TTE - normal LVEF, moderate TR, otherwise unremarkable. No intracardiac clot.     Component Value Date/Time   CHOL 127 01/31/2021 0408   TRIG 211 (H) 01/31/2021 0408   HDL <10 (L) 01/31/2021 0408   CHOLHDL NOT CALCULATED 01/31/2021 0408   VLDL 42 (H) 01/31/2021 0408   LDLCALC NOT CALCULATED 01/31/2021 0408    Lab Results  Component Value Date/Time   HGBA1C 4.4 (L) 01/28/2021 04:09 AM    ROS   UTA 2/2 encephalopathy  Past History   I have reviewed the following:  Past Medical History:  Diagnosis Date   Anxiety    Depression    Past Surgical History:  Procedure Laterality Date   APPLICATION OF WOUND VAC Left 03/08/2017   Procedure: APPLICATION OF WOUND VAC;  Surgeon: PLeim Fabry MD;  Location: [facility];  Service: Orthopedics;  Laterality: Left;   DEBRIDEMENT AND CLOSURE WOUND Left 04/03/2017   Procedure: DEBRIDEMENT AND CLOSURE WOUND;  Surgeon: DWallace Going DO;  [place] [facility]  Service: Plastics;  Laterality: Left;   HARDWARE REMOVAL Left 03/08/2017   Procedure: HARDWARE REMOVAL-LEFT LEG AND ANKLE;  Surgeon: PLeim Fabry MD;  Location: [facility];  Service: Orthopedics;  Laterality: Left;   right leg surgery Right 2014   .IM nailing right leg. screws and rod inserted.   TIBIA IM NAIL INSERTION Left 02/12/2017   Procedure: INTRAMEDULLARY (IM) NAIL TIBIAL;  Surgeon: PLeim Fabry MD;  Location: [facility];  Service: Orthopedics;  Laterality: Left;   TUBAL LIGATION  11/2002   History reviewed. No pertinent family history. Social History   Socioeconomic History   Marital status: Married    Spouse name: Not on file  Number of children: Not on file   Years of education: Not on file   Highest education level: Not on file  Occupational History   Not on file  Tobacco Use   Smoking status: Former    Packs/day: 0.50    Types: Cigarettes    Quit date: 05/04/2016    Years since  quitting: 4.7   Smokeless tobacco: Never  Vaping Use   Vaping Use: Never used  Substance and Sexual Activity   Alcohol use: Yes   Drug use: No   Sexual activity: Not on file  Other Topics Concern   Not on file  Social History Narrative   Not on file   Social Determinants of Health   Financial Resource Strain: Not on file  Food Insecurity: Not on file  Transportation Needs: Not on file  Physical Activity: Not on file  Stress: Not on file  Social Connections: Not on file   No Known Allergies  Medications   Medications Prior to Admission  Medication Sig Dispense Refill Last Dose   naproxen sodium (ALEVE) 220 MG tablet Take 440-660 mg by mouth 2 (two) times daily as needed (pain).    prn at prn   oxyCODONE (OXY IR/ROXICODONE) 5 MG immediate release tablet Take 1-2 tablets (5-10 mg total) by mouth every 4 (four) hours as needed for severe pain or breakthrough pain. (Patient not taking: Reported on 01/20/2021) 40 tablet 0 Not Taking   sodium chloride flush (NS) 0.9 % SOLN 10-40 mLs by Intracatheter route as needed (flush). (Patient not taking: Reported on 01/20/2021) 10 Syringe 1 Not Taking      Current Facility-Administered Medications:    0.9 %  sodium chloride infusion, , Intravenous, PRN, Ralene Muskrat B, MD, Last Rate: 10 mL/hr at 01/31/21 1323, New Bag at 01/31/21 1323   albuterol (PROVENTIL) (2.5 MG/3ML) 0.083% nebulizer solution 2.5 mg, 2.5 mg, Nebulization, Q4H PRN, Ivor Costa, MD   ascorbic acid (VITAMIN C) tablet 500 mg, 500 mg, Per Tube, BID, Maryjane Hurter, MD, 500 mg at 01/31/21 2137   aspirin chewable tablet 81 mg, 81 mg, Per Tube, Daily, Bennie Pierini, MD, 81 mg at 01/31/21 1710   chlorhexidine gluconate (MEDLINE KIT) (PERIDEX) 0.12 % solution 15 mL, 15 mL, Mouth Rinse, BID, Aleskerov, Fuad, MD, 15 mL at 01/31/21 1939   Chlorhexidine Gluconate Cloth 2 % PADS 6 each, 6 each, Topical, Daily, Sreenath, Sudheer B, MD, 6 each at 01/31/21 1600    dexmedetomidine (PRECEDEX) 400 MCG/100ML (4 mcg/mL) infusion, 0-1.2 mcg/kg/hr (Adjusted), Intravenous, Continuous, Schertz, Michele Mcalpine, MD, Last Rate: 7.17 mL/hr at 02/01/21 0600, 0.4 mcg/kg/hr at 02/01/21 0600   docusate (COLACE) 50 MG/5ML liquid 100 mg, 100 mg, Per Tube, BID, Bennie Pierini, MD, 100 mg at 01/31/21 2137   feeding supplement (PROSource TF) liquid 45 mL, 45 mL, Per Tube, Daily, Maryjane Hurter, MD, 45 mL at 01/31/21 1058   feeding supplement (VITAL AF 1.2 CAL) liquid 1,000 mL, 1,000 mL, Per Tube, Continuous, Maryjane Hurter, MD, Last Rate: 55 mL/hr at 02/01/21 0600, Infusion Verify at 02/01/21 0600   fentaNYL (SUBLIMAZE) injection 50 mcg, 50 mcg, Intravenous, Q15 min PRN, Schertz, Michele Mcalpine, MD   fentaNYL (SUBLIMAZE) injection 50-200 mcg, 50-200 mcg, Intravenous, Q30 min PRN, Bennie Pierini, MD, 50 mcg at 18/84/16 6063   folic acid (FOLVITE) tablet 1 mg, 1 mg, Per Tube, Daily, Rust-Chester, Britton L, NP, 1 mg at 01/31/21 1056   free water 150 mL, 150 mL, Per  Tube, Q4H, Rust-Chester, Toribio Harbour L, NP   heparin injection 5,000 Units, 5,000 Units, Subcutaneous, Q8H, Chand, Currie Paris, MD, 5,000 Units at 02/01/21 0552   influenza vac split quadrivalent PF (FLUARIX) injection 0.5 mL, 0.5 mL, Intramuscular, Tomorrow-1000, Meier, Hortencia Conradi, MD   insulin aspart (novoLOG) injection 0-15 Units, 0-15 Units, Subcutaneous, Q4H, Graves, Raeford Razor, NP, 3 Units at 02/01/21 0354   lactulose (CHRONULAC) 10 GM/15ML solution 30 g, 30 g, Per Tube, BID, Jonnie Finner, Michele Mcalpine, MD, 30 g at 01/31/21 2138   MEDLINE mouth rinse, 15 mL, Mouth Rinse, 10 times per day, Ottie Glazier, MD, 15 mL at 02/01/21 5732   midodrine (PROAMATINE) tablet 10 mg, 10 mg, Per Tube, TID WC, Darel Hong D, NP, 10 mg at 01/31/21 1710   multivitamin with minerals tablet 1 tablet, 1 tablet, Per Tube, Daily, Rust-Chester, Huel Cote, NP, 1 tablet at 01/31/21 1056   norepinephrine (LEVOPHED) 52m in 2516mpremix infusion, 0-40  mcg/min, Intravenous, Titrated, Rust-Chester, BrHuel CoteNP, Stopped at 01/30/21 1017   ondansetron (ZOFRAN) injection 4 mg, 4 mg, Intravenous, Q8H PRN, NiIvor CostaMD   pantoprazole sodium (PROTONIX) 40 mg/20 mL oral suspension 40 mg, 40 mg, Per Tube, Q1200, MeMaryjane HurterMD, 40 mg at 01/31/21 1200   piperacillin-tazobactam (ZOSYN) IVPB 3.375 g, 3.375 g, Intravenous, Q8H, ChWynelle ClevelandRPH, Last Rate: 12.5 mL/hr at 02/01/21 0553, 3.375 g at 02/01/21 0553   polyethylene glycol (MIRALAX / GLYCOLAX) packet 17 g, 17 g, Per Tube, Daily, ScBennie PieriniMD, 17 g at 01/31/21 1058   potassium chloride 10 mEq in 50 mL *CENTRAL LINE* IVPB, 10 mEq, Intravenous, Q1 Hr x 2, Rust-Chester, BrHuel CoteNP   rifaximin (XIFAXAN) tablet 550 mg, 550 mg, Per Tube, BID, MeMaryjane HurterMD, 550 mg at 01/31/21 2137   sodium chloride flush (NS) 0.9 % injection 10-40 mL, 10-40 mL, Intracatheter, Q12H, AlOttie GlazierMD, 10 mL at 01/31/21 2154   sodium chloride flush (NS) 0.9 % injection 10-40 mL, 10-40 mL, Intracatheter, PRN, AlOttie GlazierMD   thiamine (B-1) injection 100 mg, 100 mg, Intravenous, Daily, Aleskerov, Fuad, MD, 100 mg at 01/31/21 1057   zinc sulfate capsule 220 mg, 220 mg, Per Tube, Daily, ScBennie PieriniMD, 220 mg at 01/31/21 1056  Vitals   Vitals:   02/01/21 0425 02/01/21 0442 02/01/21 0500 02/01/21 0600  BP:   (!) 117/57 (!) 108/53  Pulse:   72 80  Resp:   16 18  Temp:      TempSrc:      SpO2:  95% 92% 96%  Weight: 92.3 kg     Height:         Body mass index is 33.86 kg/m.  Physical Exam   Precedex paused x30 min prior to exam  Physical Exam Gen: arousable to noxious stimuli but does not follow commands CV: extremities appear well-perfused Resp: ventilated Skin: jaundiced  Neuro: *MS: arousable to noxious stimuli but does not follow commands or attend examiner *Speech: intubated *CN: PERRL, roving eye movements, (+) corneals, oculocephalics, cough, gag.  Face symmetric. *Motor and sensory: no movement in any extremity to noxious stimuli, slight grimace w/ noxious stimuli in LUE *Reflexes: 1+ symmetric throughout, toes mute bilat *Coordination, gait: UTA   NIHSS  1a Level of Conscious.: 2 1b LOC Questions: 2 1c LOC Commands:2 2 Best Gaze: 0 3 Visual: 0 4 Facial Palsy: 0 5a Motor Arm - left: 4 5b Motor Arm - Right: 4 6a Motor Leg -  Left: 4 6b Motor Leg - Right: 4 7 Limb Ataxia: 0 8 Sensory: 1 9 Best Language: 3 10 Dysarthria: U 11 Extinct. and Inatten.: 0  TOTAL: 26 (due in large part to encephalopathy)   Premorbid mRS = 2 (uncorroborated)   Labs   CBC:  Recent Labs  Lab 01/31/21 0408 02/01/21 0439  WBC 17.5* 19.6*  HGB 8.6* 8.6*  HCT 27.7* 27.7*  MCV 123.7* 124.8*  PLT 158 287    Basic Metabolic Panel:  Lab Results  Component Value Date   NA 149 (H) 02/01/2021   K 3.3 (L) 02/01/2021   CO2 34 (H) 02/01/2021   GLUCOSE 183 (H) 02/01/2021   BUN 54 (H) 02/01/2021   CREATININE 0.77 02/01/2021   CALCIUM 8.8 (L) 02/01/2021   GFRNONAA >60 02/01/2021   GFRAA >60 03/10/2017   Lipid Panel:  Lab Results  Component Value Date   LDLCALC NOT CALCULATED 01/31/2021   HgbA1c:  Lab Results  Component Value Date   HGBA1C 4.4 (L) 01/28/2021   Urine Drug Screen:     Component Value Date/Time   LABOPIA NONE DETECTED 01/19/2021 2340   COCAINSCRNUR NONE DETECTED 01/19/2021 2340   LABBENZ NONE DETECTED 01/19/2021 2340   AMPHETMU NONE DETECTED 01/19/2021 2340   THCU NONE DETECTED 01/19/2021 2340   LABBARB NONE DETECTED 01/19/2021 2340    Alcohol Level No results found for: ETH  Impression   55 yo woman with hx MDD, EtOH abuse (active c/b liver cirrhosis, tobacco abuse, multiple previous hospital admissions for EtOH withdrawal who presented to ED 01/20/21 with hepatic encephalopathy, jaundice, and abd pain. She was admitted to floor but ultimately trasferred to ICU for unstable vital signs and hyperactive delirium  c/f DTs. MRI brain 10/30 showed acute/subacute paramedian pontine ischemic infarct. Encephalopathy likely multifactorial in setting of hyperammonemia, hepatitis, aspiration PNA, acute/subacute ischemic pontine infarct.   Recommendations   - Suspect >48 hrs from acute ischemic event based on imaging. Goal normotension, avoid hypotension. Continue midodrine 29m tid for hypotension. - ASA 860mdaily. Not dual antiplatelet 2/2 hepatic failure and c/f coagulopathy - Holding statin setting of hepatitis - MRA H&N - q4 hr neuro checks - STAT head CT for any change in neuro exam - Tele - PT/OT/SLP - Stroke education - Amb referral to neurology upon discharge   #Subacute encephalopathy #Delirium tremens - Continue thiamine 10042mV daily - rEEG Mon AM   Will continue to follow.   This patient is critically ill and at significant risk of neurological worsening, death and care requires constant monitoring of vital signs, hemodynamics,respiratory and cardiac monitoring, neurological assessment, discussion with family, other specialists and medical decision making of high complexity. I spent 75 minutes of neurocritical care time  in the care of  this patient. This was time spent independent of any time provided by nurse practitioner or PA.  ColSu MonksD [facility] [phone] 7pm- 7am, please page neurology on call as listed in AMISalamatof

## 2021-02-01 NOTE — Progress Notes (Signed)
Eeg done 

## 2021-02-01 NOTE — TOC Progression Note (Addendum)
Transition of Care [facility]) - Progression Note    Patient Details  Name: [name] Carrillo MRN: [id number] Date of Birth: 08-16-1965  Transition of Care [facility]) CM/SW Contact  Marina Goodell Phone Number: [phone] 02/01/2021, 2:55 PM  Clinical Narrative:     Patient remain intubated and unresponsive. Neurology performed EEG today 02/01/2021. Critical Care NP contacted Neurologist for plan of care conversation.  CTA Head/Neck ordered for further assessment of stenosis observed on MRA Head/Neck results recommended.   Patient's main contact spouse Deni Berti [phone]. TOC will continue to follow.  Expected Discharge Plan: Home/Self Care Barriers to Discharge: Continued Medical Work up  Expected Discharge Plan and Services Expected Discharge Plan: Home/Self Care     Post Acute Care Choice: NA Living arrangements for the past 2 months: Single Family Home                                       Social Determinants of Health (SDOH) Interventions    Readmission Risk Interventions No flowsheet data found.

## 2021-02-01 NOTE — Progress Notes (Signed)
Pt has an order for PICC placement. Discuss with Sherrill Raring about placing picc tonight but she said it can wait till tomorrow. PICC RN will follow up in am.

## 2021-02-01 NOTE — Procedures (Signed)
Patient Name: AVINA EBERLE  MRN: [id number]  Epilepsy Attending: Charlsie Quest  Referring Physician/Provider: Dr Bing Neighbors Date: 02/01/2021 Duration: 23.34 mins  Patient history: 55 year old female with altered mental status.  EEG to evaluate for seizure.  Level of alertness: lethargic   AEDs during EEG study: None  Technical aspects: This EEG study was done with scalp electrodes positioned according to the 10-20 International system of electrode placement. Electrical activity was acquired at a sampling rate of 500Hz  and reviewed with a high frequency filter of 70Hz  and a low frequency filter of 1Hz . EEG data were recorded continuously and digitally stored.   Description: EEG showed continuous generalized 3 to 6 Hz theta-delta slowing.  Hyperventilation and photic stimulation were not performed.     ABNORMALITY - Continuous slow, generalized  IMPRESSION: This study is suggestive of moderate to severe diffuse encephalopathy, nonspecific etiology. No seizures or epileptiform discharges were seen throughout the recording.  Tawanna Funk 

## 2021-02-01 NOTE — Progress Notes (Signed)
Discussed MRA Head/Neck results via secure chat in Epic with neurology Dr. Otelia Limes to determine plan of care going forward.  Per Dr. Otelia Limes recommendations CTA Head/Neck ordered for further assessment of stenosis observed on MRA Head/Neck results, and also if aneurysm is verified on CTA this can be followed in the outpatient setting.  Will continue to monitor and assess pt.   Camie Patience, AGNP  Pulmonary/Critical Care Pager [phone] (please enter 7 digits) PCCM Consult Pager [phone] (please enter 7 digits)

## 2021-02-01 NOTE — Plan of Care (Signed)

## 2021-02-01 NOTE — Progress Notes (Signed)
Nutrition Follow-up  DOCUMENTATION CODES:   Not applicable  INTERVENTION:   Continue TF via OGT:   Vital 1.2 @ 55 ml/hr (1320 ml daily)  45 ml Prosource TF daily  150 ml free water flush every 4 hours  Tube feeding regimen provides 1624 kcal (100% of needs), 110 grams of protein, and 1071 ml of H2O.  Total free water: 1971 ml daily  NUTRITION DIAGNOSIS:   Inadequate oral intake related to inability to eat (pt sedated and ventilated) as evidenced by NPO status.  Ongoing  GOAL:   Provide needs based on ASPEN/SCCM guidelines  Met with TF  MONITOR:   Vent status, Labs, Weight trends, Skin, I & O's, TF tolerance  REASON FOR ASSESSMENT:   Consult, Ventilator Assessment of nutrition requirement/status  ASSESSMENT:   55 y/o female with h/o etoh abuse, anxiety, depression and cirrhosis who is admitted with encephalopathy.  Patient is currently intubated on ventilator support MV: 6.7 L/min Temp (24hrs), Avg:99.2 F (37.3 C), Min:98.7 F (37.1 C), Max:100.2 F (37.9 C)  Reviewed I/O's: -82 ml x 24 hours and +14.2 L since admission  UOP: 1.7 L x 24 hours  Rectal tube output: 500 ml x 24 hours   Case discussed with MD, RN, and during ICU rounds.   Per MD, plan to follow-up on neuro consult and further studies given MRI results yesterday.   Pt remains on lactulose with rectal tube. She is off precedex.  Pt may require trach and PEG pending goals of care.   Medications reviewed and include vitamin C, colace, folic acid, lactulose, solu-medrol, miralax, and thiamine,  Labs reviewed: CBGS: 166-214 (inpatient orders for glycemic control are 0-15 units insulin aspart every 4 hours).    Diet Order:   Diet Order             Diet NPO time specified  Diet effective now                   EDUCATION NEEDS:   No education needs have been identified at this time  Skin:  Skin Assessment: Reviewed RN Assessment (ecchymosis)  Last BM:  02/01/21 (150 ml via  rectal tube)  Height:   Ht Readings from Last 1 Encounters:  01/19/21 '5\' 5"'  (1.651 m)    Weight:   Wt Readings from Last 1 Encounters:  02/01/21 92.3 kg    Ideal Body Weight:  56.8 kg  BMI:  Body mass index is 33.86 kg/m.  Estimated Nutritional Needs:   Kcal:  4360-6770  Protein:  > 110 grams  Fluid:  > 1 L    Loistine Chance, RD, LDN, [place] Registered Dietitian II Certified Diabetes Care and Education Specialist Please refer to [facility] for RD and/or RD on-call/weekend/after hours pager

## 2021-02-01 NOTE — Progress Notes (Addendum)
NAME:  [name] Carrillo, MRN:  [id number], DOB:  09/29/1965, LOS: 38 ADMISSION DATE:  01/20/2021, CONSULTATION DATE:  01/24/21 REFERRING MD:  Priscella Mann, CHIEF COMPLAINT:  unresponsive   History of Present Illness:  55 yo F wthi hx of MDD, Anxiety disorder, osteomyelitis of left tibia, left tibial fracture history, recurrent UTIs, EtOH abuse and liver Cirrhosis ([place]), active alcoholism, tobacco abuse with multiple readmissions to hospital with EtOH withdrawal syndrome and now being brought into MICU due to unstable vital signs and aggitated hyperactive delerium with concerns for delerium tremens. Blood work is notable for transaminitis with decreased synthetic function.     Patient had ziplock bag with two teeth in there due to loose teeth in mouth. Husband shares that patient has few false teeth that she had created herself.    Ive met with husband and son and explained that patient is unresponsive with GCS7 and is not able to protect airway. They understand the severity of situation and agree for intubation if needed. We discussed goals of care and patient is full code.   Pertinent  Medical History  MDD Anxiety Alcohol use disorder Alcoholic cirrhosis Smoking  Significant Hospital Events: Including procedures, antibiotic start and stop dates in addition to other pertinent events   10/19 Pt admitted to the [facility] unit with delirium tremors secondary to ETOH withdrawal  10/19: CT Head negative  10/19: CT Abd/Pelvis revealed Hepatomegaly with heterogeneous liver enhancement and evidence of portal venous hypertension including recanalized paraumbilical vein, small spleno-renal varices, and small volume ascites. Chronic liver disease strongly suspected although the enhancement pattern might indicate a superimposed acute hepatitis. No evidence of bile duct obstruction. No discrete liver lesion. No obstructive uropathy but absent renal contrast excretion on the delayed images suggesting acute renal  insufficiency. Punctate nephrolithiasis. Borderline to mild cardiomegaly. Bilateral lower lobe atelectasis. Mild aortic atherosclerosis.  10/24 Transfer to ICU, intubation, CVL placed 10/28: Pt with hepatic encephalopathy currently undergoing SBT 10/5.  14L positive will administer 40 mg iv lasix x1 dose   Interim History / Subjective:  No acute events overnight.  Pt remains mechanically intubated.  Will plan to perform WUA once pt returns from MRI   Objective   Blood pressure (!) 108/53, pulse 80, temperature 100.2 F (37.9 C), temperature source Oral, resp. rate 18, height 5' 5" (1.651 m), weight 92.3 kg, last menstrual period 09/07/2015, SpO2 96 %.    Vent Mode: PRVC FiO2 (%):  [40 %-50 %] 50 % Set Rate:  [16 bmp-40 bmp] 16 bmp Vt Set:  [400 mL] 400 mL PEEP:  [5 cmH20] 5 cmH20 Plateau Pressure:  [15 cmH20-20 cmH20] 20 cmH20   Intake/Output Summary (Last 24 hours) at 02/01/2021 7026 Last data filed at 02/01/2021 0600 Gross per 24 hour  Intake 2108.47 ml  Output 2190 ml  Net -81.53 ml   Filed Weights   01/30/21 0432 01/31/21 0414 02/01/21 0425  Weight: 94.9 kg 94.1 kg 92.3 kg    Examination: General: acutely ill appearing female, NAD mechanically intubated  HENT: supple, no JVD  Lungs: faint rhonchi throughout, even, non labored Cardiovascular: sinus rhythm, no R/G, 2+ radial/1+ distal pulses, 1+ generalized edema  Abdomen: +BS x4, soft, obese, abdominal distension present  Extremities: normal bulk  Neuro: sedated, not following commands, withdraws from painful stimulation, PERRL, icteric sclera  GU: indwelling foley catheter draining dark yellow urine    Resolved Hospital Problem list     Assessment & Plan:   Acute encephalopathy multifactorial secondary acute/subacute ischemic pontine  infarct, hyperammonemia and infectious process  Moderate-severe alcohol withdrawal with delirium Continue Precedex gtt as needed  MRA Head/Neck pending  EEG pending  Neurology  consulted appreciate input  Monitor CIWA protocol Continue thiamine, folate and multivitamins Continue lactulose 30 g per tube daily and rifaximin  Trend ammonia   UTI due to E. coli/Klebsiella~completed course of ceftriaxone  Worsening leukocytosis secondary to pneumonia  Trend WBC and monitor fever curve  Follow cultures  Continue zosyn   Acute hypoxic respiratory failure Pneumonia Full vent support for now  SAT/SBT as tolerated VAP bundle implemented Intermittent CXR and ABG   Acute alcoholic hepatitis Ascites  Monitor hepatic function panel   Mild Hypernatremia  Hypokalemia  Monitor BMP Replace electrolytes as indicated  Monitor UOP Continue free water flushes _0  ml q4hrs   Severe protein calorie malnutrition Dietitian consulted appreciate input~continue tube feeds  Macrocytic anemia due to folate deficiency Thrombocytopenia Likely driven by liver disease/alcoholism Continue folic acid supplementation Trend CBC Monitor for s/sx of bleeding   Best Practice (right click and "Reselect all SmartList Selections" daily)   Diet/type: tubefeeds DVT prophylaxis: SCD; subq heparin  GI prophylaxis: PPI Lines: Central line Foley:  Yes and still indicated  Code Status:  full code Last date of multidisciplinary goals of care discussion [02/01/2021]  Critical care time: 30 minutes    Rosilyn Mings, [place] Pager [phone] (please enter 7 digits) PCCM Consult Pager [phone] (please enter 7 digits)

## 2021-02-02 ENCOUNTER — Inpatient Hospital Stay: Payer: BC Managed Care – PPO

## 2021-02-02 DIAGNOSIS — G9341 Metabolic encephalopathy: Secondary | ICD-10-CM | POA: Diagnosis not present

## 2021-02-02 LAB — COMPREHENSIVE METABOLIC PANEL
ALT: 99 U/L — ABNORMAL HIGH (ref 0–44)
AST: 251 U/L — ABNORMAL HIGH (ref 15–41)
Albumin: 2.5 g/dL — ABNORMAL LOW (ref 3.5–5.0)
Alkaline Phosphatase: 128 U/L — ABNORMAL HIGH (ref 38–126)
Anion gap: 7 (ref 5–15)
BUN: 54 mg/dL — ABNORMAL HIGH (ref 6–20)
CO2: 35 mmol/L — ABNORMAL HIGH (ref 22–32)
Calcium: 9.4 mg/dL (ref 8.9–10.3)
Chloride: 106 mmol/L (ref 98–111)
Creatinine, Ser: 0.8 mg/dL (ref 0.44–1.00)
GFR, Estimated: >60 mL/min (ref >60)
Glucose, Bld: 194 mg/dL — ABNORMAL HIGH (ref 70–99)
Potassium: 4 mmol/L (ref 3.5–5.1)
Sodium: 148 mmol/L — ABNORMAL HIGH (ref 135–145)
Total Bilirubin: 5.4 mg/dL — ABNORMAL HIGH (ref 0.3–1.2)
Total Protein: 6.9 g/dL (ref 6.5–8.1)

## 2021-02-02 LAB — CBC
HCT: 28.9 % — ABNORMAL LOW (ref 36.0–46.0)
Hemoglobin: 8.9 g/dL — ABNORMAL LOW (ref 12.0–15.0)
MCH: 38.9 pg — ABNORMAL HIGH (ref 26.0–34.0)
MCHC: 30.8 g/dL (ref 30.0–36.0)
MCV: 126.2 fL — ABNORMAL HIGH (ref 80.0–100.0)
Platelets: 296 10*3/uL (ref 150–400)
RBC: 2.29 MIL/uL — ABNORMAL LOW (ref 3.87–5.11)
RDW: 25.4 % — ABNORMAL HIGH (ref 11.5–15.5)
WBC: 27.8 10*3/uL — ABNORMAL HIGH (ref 4.0–10.5)
nRBC: 0.4 % — ABNORMAL HIGH (ref 0.0–0.2)

## 2021-02-02 LAB — BLOOD GAS, ARTERIAL
Acid-Base Excess: 10.7 mmol/L — ABNORMAL HIGH (ref 0.0–2.0)
Bicarbonate: 38.5 mmol/L — ABNORMAL HIGH (ref 20.0–28.0)
FIO2: 55
MECHVT: 400 mL
Mechanical Rate: 16
O2 Saturation: 96.8 %
Patient temperature: 37
RATE: 16 resp/min
pCO2 arterial: 73 mmHg (ref 32.0–48.0)
pH, Arterial: 7.33 — ABNORMAL LOW (ref 7.350–7.450)
pO2, Arterial: 94 mmHg (ref 83.0–108.0)

## 2021-02-02 LAB — GLUCOSE, CAPILLARY
Glucose-Capillary: 189 mg/dL — ABNORMAL HIGH (ref 70–99)
Glucose-Capillary: 196 mg/dL — ABNORMAL HIGH (ref 70–99)
Glucose-Capillary: 170 mg/dL — ABNORMAL HIGH (ref 70–99)
Glucose-Capillary: 234 mg/dL — ABNORMAL HIGH (ref 70–99)
Glucose-Capillary: 192 mg/dL — ABNORMAL HIGH (ref 70–99)
Glucose-Capillary: 189 mg/dL — ABNORMAL HIGH (ref 70–99)

## 2021-02-02 LAB — PROTIME-INR
INR: 1.3 — ABNORMAL HIGH (ref 0.8–1.2)
Prothrombin Time: 15.9 seconds — ABNORMAL HIGH (ref 11.4–15.2)

## 2021-02-02 LAB — AMMONIA: Ammonia: 70 umol/L — ABNORMAL HIGH (ref 9–35)

## 2021-02-02 LAB — HEMOGLOBIN AND HEMATOCRIT, BLOOD
HCT: 27.8 % — ABNORMAL LOW (ref 36.0–46.0)
Hemoglobin: 8.8 g/dL — ABNORMAL LOW (ref 12.0–15.0)

## 2021-02-02 LAB — MAGNESIUM: Magnesium: 2.1 mg/dL (ref 1.7–2.4)

## 2021-02-02 LAB — PHOSPHORUS: Phosphorus: 2.3 mg/dL — ABNORMAL LOW (ref 2.5–4.6)

## 2021-02-02 IMAGING — US US ABDOMEN LIMITED
1 series · 5 of 5 positions shown · non-contrast
Comparison: None.

CLINICAL DATA: Abdominal distension

EXAM:
LIMITED ABDOMEN ULTRASOUND FOR ASCITES
TECHNIQUE: Limited ultrasound survey for ascites was performed in all four
abdominal quadrants.

[Series 1: us abdomen limited · 5 of 5 slices shown]
[im 1/5]
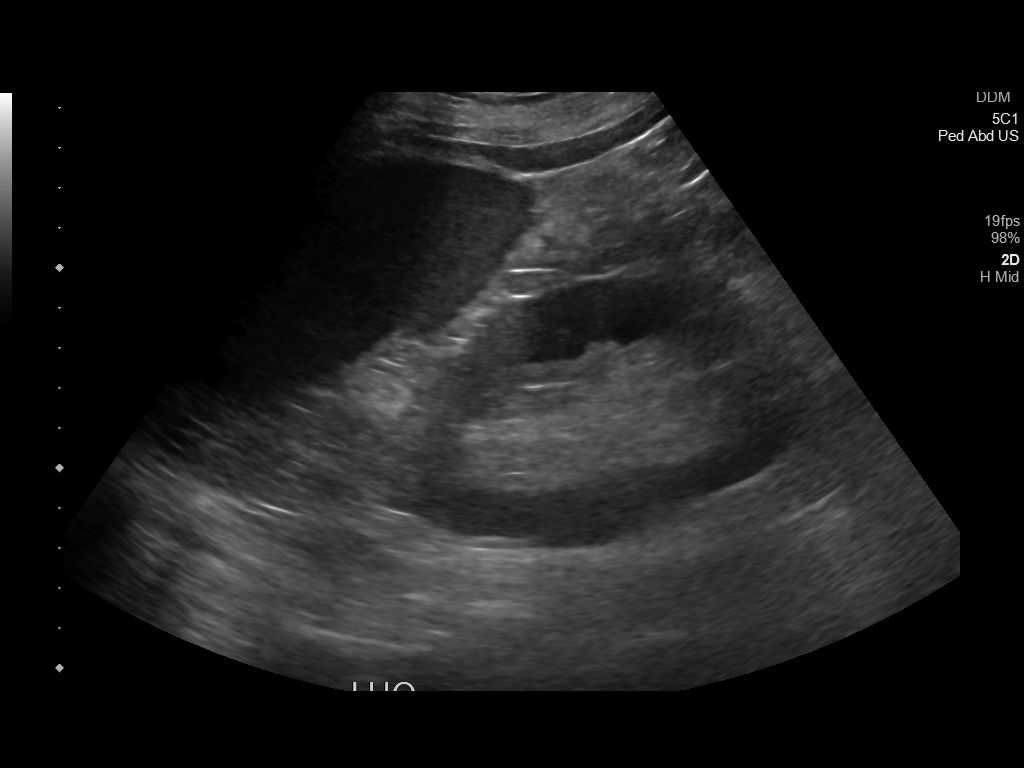
[im 2/5]
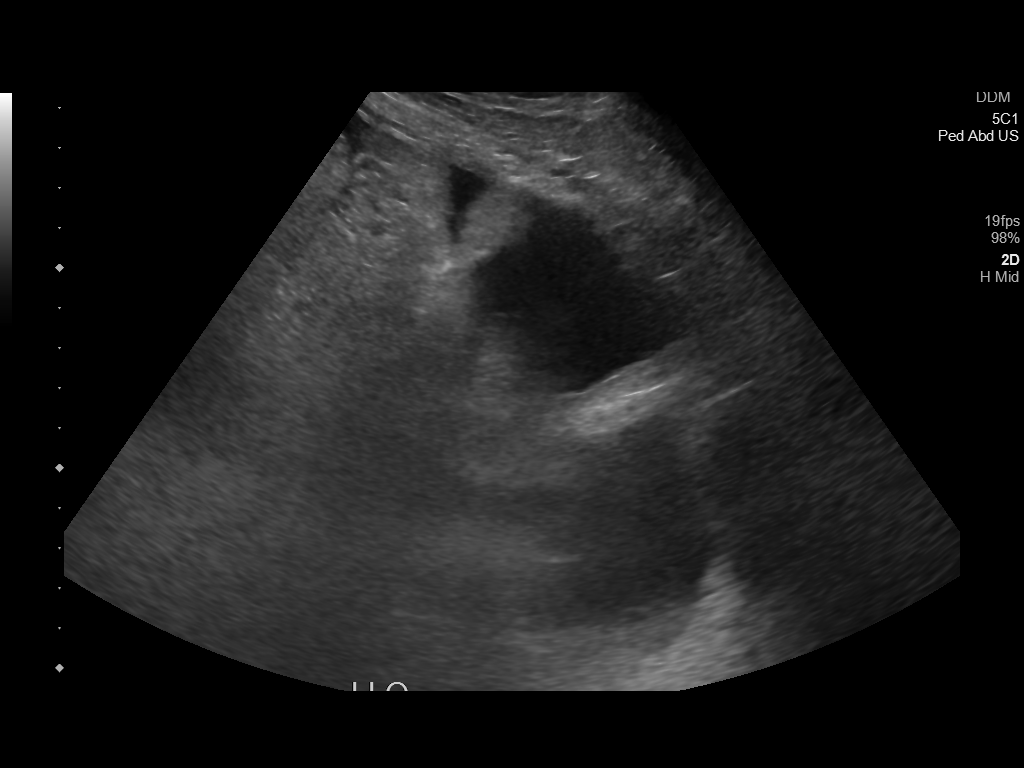
[im 3/5]
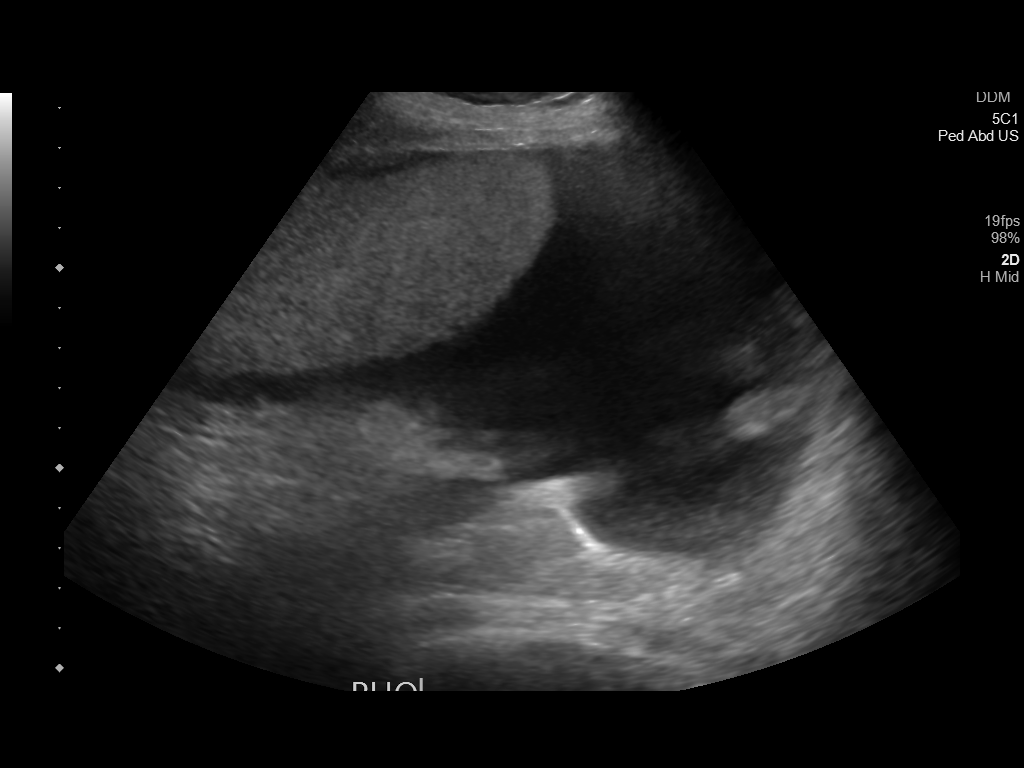
[im 4/5]
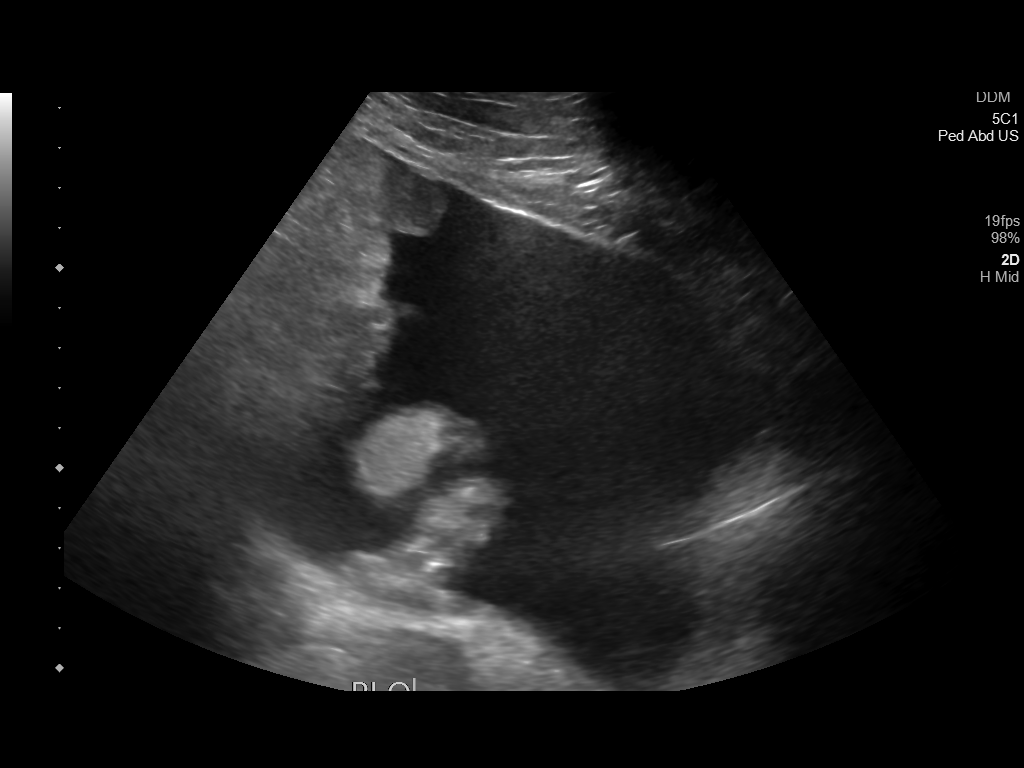
[im 5/5]
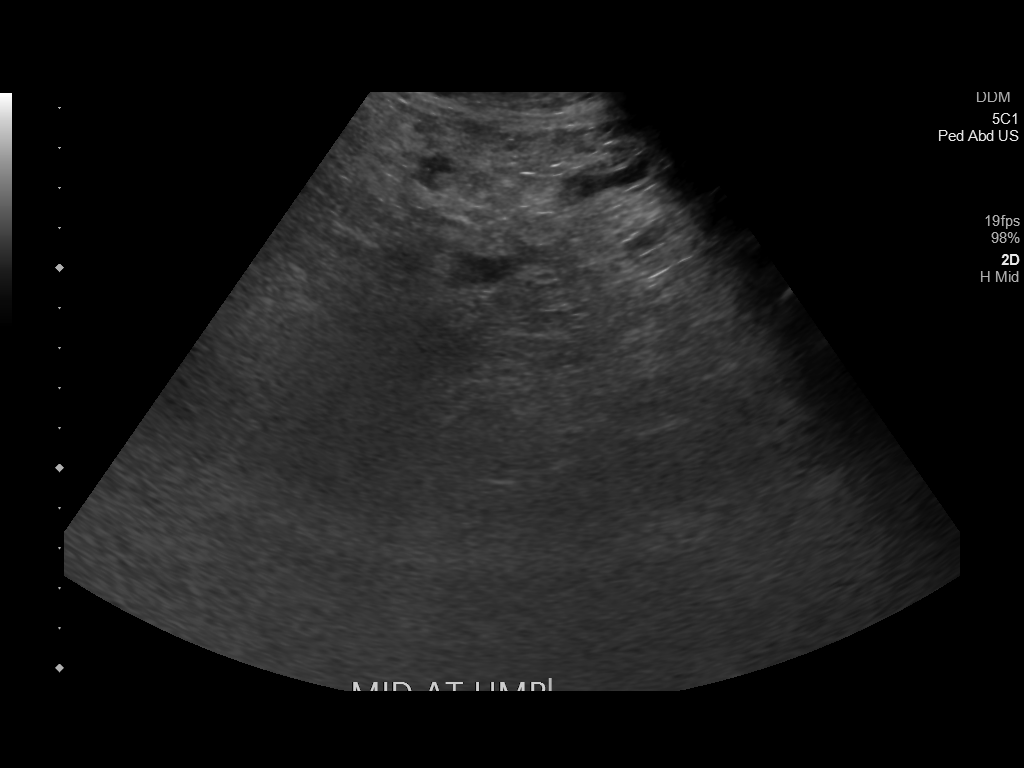

[5 of 5 positions shown; findings below may reference images not displayed]

FINDINGS: Ascites is identified in the right upper and lower quadrants as well
as the left lower quadrant.
IMPRESSION: Overall small to moderate volume ascites, greater on the right.

## 2021-02-02 MED ORDER — FUROSEMIDE 10 MG/ML IJ SOLN
80.0000 mg | Freq: Once | INTRAMUSCULAR | Status: AC
  Administered 2021-02-02: 80 mg via INTRAVENOUS
  Filled 2021-02-02: qty 8

## 2021-02-02 MED ORDER — SODIUM PHOSPHATES 45 MMOLE/15ML IV SOLN: 15.0000 mmol | Freq: Once | INTRAVENOUS | Status: DC

## 2021-02-02 MED ORDER — POTASSIUM PHOSPHATES 15 MMOLE/5ML IV SOLN
15.0000 mmol | Freq: Once | INTRAVENOUS | Status: AC
  Administered 2021-02-02: 15 mmol via INTRAVENOUS
  Filled 2021-02-02: qty 5

## 2021-02-02 MED ORDER — ALBUMIN HUMAN 25 % IV SOLN
25.0000 g | Freq: Once | INTRAVENOUS | Status: AC
  Administered 2021-02-02: 25 g via INTRAVENOUS
  Filled 2021-02-02: qty 100

## 2021-02-02 MED ORDER — FREE WATER
175.0000 mL | Status: DC
  Administered 2021-02-02 – 2021-02-03 (×6): 175 mL

## 2021-02-02 NOTE — Progress Notes (Signed)
Peripherally Inserted Central Catheter Placement  The IV Nurse has discussed with the patient and/or persons authorized to consent for the patient, the purpose of this procedure and the potential benefits and risks involved with this procedure.  The benefits include less needle sticks, lab draws from the catheter, and the patient may be discharged home with the catheter. Risks include, but not limited to, infection, bleeding, blood clot (thrombus formation), and puncture of an artery; nerve damage and irregular heartbeat and possibility to perform a PICC exchange if needed/ordered by physician.  Alternatives to this procedure were also discussed.  Bard Power PICC patient education guide, fact sheet on infection prevention and patient information card has been provided to patient /or left at bedside.    PICC Placement Documentation        Stacie Glaze Horton 02/02/2021, 10:55 AM

## 2021-02-02 NOTE — Consult Note (Signed)
..  [name] Carrillo, [name] Carrillo [id number] [birth year] [name] Fulling, MD  Reason for Consult: tracheostomy tube placement  HPI: 55 y.o. female with history of liver failure and asked to consult for need for long term ventilatory support.    Allergies: No Known Allergies  ROS: Review of systems normal other than 12 systems except per HPI.  PMH:  Past Medical History:  Diagnosis Date   Anxiety    Depression     FH: History reviewed. No pertinent family history.  SH:  Social History   Socioeconomic History   Marital status: Married    Spouse name: Not on file   Number of children: Not on file   Years of education: Not on file   Highest education level: Not on file  Occupational History   Not on file  Tobacco Use   Smoking status: Former    Packs/day: 0.50    Types: Cigarettes    Quit date: 05/04/2016    Years since quitting: 4.7   Smokeless tobacco: Never  Vaping Use   Vaping Use: Never used  Substance and Sexual Activity   Alcohol use: Yes   Drug use: No   Sexual activity: Not on file  Other Topics Concern   Not on file  Social History Narrative   Not on file   Social Determinants of Health   Financial Resource Strain: Not on file  Food Insecurity: Not on file  Transportation Needs: Not on file  Physical Activity: Not on file  Stress: Not on file  Social Connections: Not on file  Intimate Partner Violence: Not on file    PSH:  Past Surgical History:  Procedure Laterality Date   APPLICATION OF WOUND VAC Left 03/08/2017   Procedure: APPLICATION OF WOUND VAC;  Surgeon: Signa Kell, MD;  Location: [facility] ORS;  Service: Orthopedics;  Laterality: Left;   DEBRIDEMENT AND CLOSURE WOUND Left 04/03/2017   Procedure: DEBRIDEMENT AND CLOSURE WOUND;  Surgeon: Peggye Form, DO;  Location: [facility];  Service: Plastics;  Laterality: Left;   HARDWARE REMOVAL Left 03/08/2017   Procedure: HARDWARE REMOVAL-LEFT LEG AND ANKLE;  Surgeon: Signa Kell, MD;  Location: [facility]  ORS;  Service: Orthopedics;  Laterality: Left;   right leg surgery Right 2014   .IM nailing right leg. screws and rod inserted.   TIBIA IM NAIL INSERTION Left 02/12/2017   Procedure: INTRAMEDULLARY (IM) NAIL TIBIAL;  Surgeon: Signa Kell, MD;  Location: [facility] ORS;  Service: Orthopedics;  Laterality: Left;   TUBAL LIGATION  11/2002    Physical  Exam:  GEN-  sedated and intubated, no response to stimuli NEURO-  sedated and unresponsive EARS-  external ears clear NOSE-  clear anteriorly OC/OP-  ETT 8.0 secure and patent NECK-  Limited thyroid landmarks palpated due to patient's body habitus and edema.  A/P: Respiratory failure with need for long term tracheostomy tube  Plan:  Family wishes to wait until next week prior to placement of tracheostomy tube.  Will check with OR and see when we can get time.  Discussed risks and benefits of trach with family.  Patient will be at high risk of post-operative bleeding due to coagulopathy from liver failure.  Will need ICU assistance to help with coagulopathy prior to surgery once the date has been finalized with family.   [name] Carrillo 02/02/2021 11:09 PM

## 2021-02-02 NOTE — Progress Notes (Signed)
Attempted right upper arm PICC x2 without success. Unable to thread into SVC, tip continues to thread in to R IJ. Tolerated well. 2 20 g x 1.75 PIVs inserted with [place] without difficulty. Will continue to monitor.

## 2021-02-02 NOTE — Plan of Care (Signed)

## 2021-02-02 NOTE — Progress Notes (Addendum)
NAME:  [name] Carrillo, MRN:  [id number], DOB:  [birth year], LOS: 59 ADMISSION DATE:  01/20/2021, CONSULTATION DATE:  01/24/21 REFERRING MD:  Priscella Mann, CHIEF COMPLAINT:  unresponsive   History of Present Illness:  55 yo F wthi hx of MDD, Anxiety disorder, osteomyelitis of left tibia, left tibial fracture history, recurrent UTIs, EtOH abuse and liver Cirrhosis ([place]), active alcoholism, tobacco abuse with multiple readmissions to hospital with EtOH withdrawal syndrome and now being brought into MICU due to unstable vital signs and aggitated hyperactive delerium with concerns for delerium tremens. Blood work is notable for transaminitis with decreased synthetic function.     Patient had ziplock bag with two teeth in there due to loose teeth in mouth. Husband shares that patient has few false teeth that she had created herself.    Ive met with husband and son and explained that patient is unresponsive with GCS7 and is not able to protect airway. They understand the severity of situation and agree for intubation if needed. We discussed goals of care and patient is full code.   Pertinent  Medical History  MDD Anxiety Alcohol use disorder Alcoholic cirrhosis Smoking  Significant Hospital Events: Including procedures, antibiotic start and stop dates in addition to other pertinent events   10/19 Pt admitted to the [facility] unit with delirium tremors secondary to ETOH withdrawal  10/19: CT Head negative  10/19: CT Abd/Pelvis revealed Hepatomegaly with heterogeneous liver enhancement and evidence of portal venous hypertension including recanalized paraumbilical vein, small spleno-renal varices, and small volume ascites. Chronic liver disease strongly suspected although the enhancement pattern might indicate a superimposed acute hepatitis. No evidence of bile duct obstruction. No discrete liver lesion. No obstructive uropathy but absent renal contrast excretion on the delayed images suggesting acute renal  insufficiency. Punctate nephrolithiasis. Borderline to mild cardiomegaly. Bilateral lower lobe atelectasis. Mild aortic atherosclerosis.  10/24 Transfer to ICU, intubation, CVL placed 10/25: Echo revealed EF 60 to 65% 10/28: Pt with hepatic encephalopathy currently undergoing SBT 10/5.  14L positive will administer 40 mg iv lasix x1 dose  10/28: CT Head motion limited exam. Allowing for this, no acute intracranial abnormality. 10/28: CT Maxillofacial revealed prominent dental caries within the residual right maxillary molar. No associated inflammation. No other focal dental or periodontal disease to suggest infection. Minimal inflammatory changes about the lateral right orbit. No abscess. 10/31: CTA Head revealed no acute intracranial abnormality. A 6 mm left paraclinoid ICA aneurysm. Consult to neuro intervention suggested. No intracranial large vessel occlusion or hemodynamically significant stenosis. Intracranial stenosis described on prior MR angiogram are likely artifactual. Patent major neck arteries without hemodynamically significant stenosis. 10/31: MRA Head/Neck revealed medially directed 6 mm aneurysm from the distal left cavernous carotid/ophthalmic segment. Poor visualization of the right cavernous carotid, which may indicate slow flow or stenosis. Consider CTA for further evaluation. Apparent narrowing of the distal basilar artery, which may be artifactual or indicate focal stenosis. No hemodynamically significant stenosis in the neck. No large vessel occlusion. 10/31: EEG suggestive of moderate to severe diffuse encephalopathy, nonspecific etiology. No seizures or epileptiform discharges were seen throughout the recording.  11/1: Pt remains severely encephalopathic despite sedation remaining off since 10/31 _0 :22 am   Interim History / Subjective:  No acute events overnight.  Pt remains mechanically intubated.    Objective   Blood pressure 126/60, pulse (!) 104, temperature 99 F  (37.2 C), resp. rate 18, height _1  (1.651 m), weight 92.3 kg, last menstrual period 09/07/2015, SpO2 94 %.  Vent Mode: PRVC FiO2 (%):  [50 %-65 %] 65 % Set Rate:  [16 bmp] 16 bmp Vt Set:  [400 mL] 400 mL PEEP:  [5 cmH20] 5 cmH20 Plateau Pressure:  [14 cmH20-18 cmH20] 18 cmH20   Intake/Output Summary (Last 24 hours) at 02/02/2021 0940 Last data filed at 02/02/2021 0600 Gross per 24 hour  Intake 2688.37 ml  Output 3050 ml  Net -361.63 ml   Filed Weights   01/30/21 0432 01/31/21 0414 02/01/21 0425  Weight: 94.9 kg 94.1 kg 92.3 kg    Examination: General: acutely ill appearing female, NAD mechanically intubated  HENT: supple, no JVD  Lungs: diffuse rhonchi and crackles throughout, even, non labored Cardiovascular: sinus rhythm, no R/G, 2+ radial/1+ distal pulses, 2+ generalized edema  Abdomen: +BS x4, soft, obese, abdominal distension present  Extremities: normal bulk  Neuro: not following commands, facial grimaces and gag reflex present when suctioned , PERRL, icteric sclera  GU: indwelling foley catheter draining dark yellow urine    Resolved Hospital Problem list     Assessment & Plan:   Acute encephalopathy multifactorial secondary acute/subacute ischemic pontine infarct, hyperammonemia and infectious process  Moderate-severe alcohol withdrawal with delirium Will hold all sedation for now to assess neurological status  Neurology consulted appreciate input~per neuro aneurysm on CTA Head/Neck and MRA Head Neck can be followed in the outpatient setting  Monitor CIWA protocol Continue thiamine, folate and multivitamins Continue lactulose 30 g per tube daily and rifaximin  Trend ammonia   UTI due to E. coli/Klebsiella~completed course of ceftriaxone  Worsening leukocytosis secondary to pneumonia  Trend WBC and monitor fever curve  Follow cultures  Continue zosyn   Acute hypoxic respiratory failure Pneumonia Full vent support for now  SAT/SBT as tolerated VAP  bundle implemented Intermittent CXR and ABG  Pt 13L positive will administer 80 mg iv lasix   Acute alcoholic hepatitis Ascites  Monitor hepatic function panel  US Abdomen pending to assess ascites   Hypernatremia  Monitor BMP Replace electrolytes as indicated  Monitor UOP Increased free water flushes to 175 ml q4hrs   Severe protein calorie malnutrition Dietitian consulted appreciate input~continue tube feeds  Macrocytic anemia due to folate deficiency Thrombocytopenia Likely driven by liver disease/alcoholism Continue folic acid supplementation Trend CBC Monitor for s/sx of bleeding   Best Practice (right click and "Reselect all SmartList Selections" daily)   Diet/type: tubefeeds DVT prophylaxis: SCD; subq heparin  GI prophylaxis: PPI Lines: Central line Foley:  Yes and still indicated  Code Status:  full code Last date of multidisciplinary goals of care discussion [02/02/2021]  Critical care time: 30 minutes    Rosilyn Mings, [place] Pager [phone] (please enter 7 digits) PCCM Consult Pager [phone] (please enter 7 digits)

## 2021-02-02 NOTE — Progress Notes (Signed)
GOALS OF CARE DISCUSSION  The Clinical status was relayed to family in detail. Husband and Aunt at bedside  Updated and notified of patients medical condition.    Patient remains unresponsive and will not open eyes to command.   Patient is having a weak cough and struggling to remove secretions.   Patient with increased WOB and using accessory muscles to breathe Explained to family course of therapy and the modalities  End stage liver cirrhosis with resp failure with severe aspiration pneumonia Needs TRACH for survival Family has agreed and consented to [facility] consult ENT  Patient with Progressive multiorgan failure with a very high probablity of a very minimal chance of meaningful recovery despite all aggressive and optimal medical therapy.  PATIENT REMAINS FULL CODE  Family understands the situation.    Family are satisfied with Plan of action and management. All questions answered  Additional CC time 35 mins   Markiesha Delia Santiago Glad, M.D.  [facility]  Medical Director [facility] Medical Director [facility] Cardio-Pulmonary Department

## 2021-02-03 DIAGNOSIS — G9341 Metabolic encephalopathy: Secondary | ICD-10-CM | POA: Diagnosis not present

## 2021-02-03 LAB — COMPREHENSIVE METABOLIC PANEL
ALT: 93 U/L — ABNORMAL HIGH (ref 0–44)
AST: 219 U/L — ABNORMAL HIGH (ref 15–41)
Albumin: 2.6 g/dL — ABNORMAL LOW (ref 3.5–5.0)
Alkaline Phosphatase: 124 U/L (ref 38–126)
Anion gap: 8 (ref 5–15)
BUN: 51 mg/dL — ABNORMAL HIGH (ref 6–20)
CO2: 34 mmol/L — ABNORMAL HIGH (ref 22–32)
Calcium: 9.2 mg/dL (ref 8.9–10.3)
Chloride: 108 mmol/L (ref 98–111)
Creatinine, Ser: 0.7 mg/dL (ref 0.44–1.00)
GFR, Estimated: >60 mL/min (ref >60)
Glucose, Bld: 211 mg/dL — ABNORMAL HIGH (ref 70–99)
Potassium: 3.9 mmol/L (ref 3.5–5.1)
Sodium: 150 mmol/L — ABNORMAL HIGH (ref 135–145)
Total Bilirubin: 4.9 mg/dL — ABNORMAL HIGH (ref 0.3–1.2)
Total Protein: 6.9 g/dL (ref 6.5–8.1)

## 2021-02-03 LAB — CBC WITH DIFFERENTIAL/PLATELET
Abs Immature Granulocytes: 0.83 10*3/uL — ABNORMAL HIGH (ref 0.00–0.07)
Basophils Absolute: 0.1 10*3/uL (ref 0.0–0.1)
Basophils Relative: 0 %
Eosinophils Absolute: 0 10*3/uL (ref 0.0–0.5)
Eosinophils Relative: 0 %
HCT: 26.5 % — ABNORMAL LOW (ref 36.0–46.0)
Hemoglobin: 8.3 g/dL — ABNORMAL LOW (ref 12.0–15.0)
Immature Granulocytes: 3 %
Lymphocytes Relative: 8 %
Lymphs Abs: 2.1 10*3/uL (ref 0.7–4.0)
MCH: 39.2 pg — ABNORMAL HIGH (ref 26.0–34.0)
MCHC: 31.3 g/dL (ref 30.0–36.0)
MCV: 125 fL — ABNORMAL HIGH (ref 80.0–100.0)
Monocytes Absolute: 2.2 10*3/uL — ABNORMAL HIGH (ref 0.1–1.0)
Monocytes Relative: 9 %
Neutro Abs: 20.4 10*3/uL — ABNORMAL HIGH (ref 1.7–7.7)
Neutrophils Relative %: 80 %
Platelets: 270 10*3/uL (ref 150–400)
RBC: 2.12 MIL/uL — ABNORMAL LOW (ref 3.87–5.11)
RDW: 26.1 % — ABNORMAL HIGH (ref 11.5–15.5)
Smear Review: NORMAL
WBC: 25.6 10*3/uL — ABNORMAL HIGH (ref 4.0–10.5)
nRBC: 0.5 % — ABNORMAL HIGH (ref 0.0–0.2)

## 2021-02-03 LAB — GLUCOSE, CAPILLARY
Glucose-Capillary: 176 mg/dL — ABNORMAL HIGH (ref 70–99)
Glucose-Capillary: 156 mg/dL — ABNORMAL HIGH (ref 70–99)
Glucose-Capillary: 147 mg/dL — ABNORMAL HIGH (ref 70–99)
Glucose-Capillary: 212 mg/dL — ABNORMAL HIGH (ref 70–99)
Glucose-Capillary: 176 mg/dL — ABNORMAL HIGH (ref 70–99)

## 2021-02-03 LAB — PHOSPHORUS: Phosphorus: 2.4 mg/dL — ABNORMAL LOW (ref 2.5–4.6)

## 2021-02-03 LAB — MAGNESIUM: Magnesium: 2 mg/dL (ref 1.7–2.4)

## 2021-02-03 MED ORDER — VITAL 1.5 CAL PO LIQD
1000.0000 mL | ORAL | Status: DC
  Administered 2021-02-03 – 2021-02-13 (×10): 1000 mL

## 2021-02-03 MED ORDER — ACETAZOLAMIDE SODIUM 500 MG IJ SOLR
500.0000 mg | Freq: Once | INTRAMUSCULAR | Status: AC
Start: 2021-02-03 — End: 2021-02-03
  Administered 2021-02-03: 500 mg via INTRAVENOUS
  Filled 2021-02-03: qty 500

## 2021-02-03 MED ORDER — STERILE WATER FOR INJECTION IJ SOLN
INTRAMUSCULAR | Status: AC
  Administered 2021-02-03: 5 mL
  Filled 2021-02-03: qty 10

## 2021-02-03 MED ORDER — DEXTROSE 5 % IV SOLN: INTRAVENOUS | Status: DC

## 2021-02-03 MED ORDER — FREE WATER
200.0000 mL | Status: DC
  Administered 2021-02-03 – 2021-02-13 (×58): 200 mL

## 2021-02-03 NOTE — Progress Notes (Signed)
Nutrition Follow Up Note   DOCUMENTATION CODES:   Not applicable  INTERVENTION:   Change to Vital 1.5 @ 65m/hr + Pro-Source 464mdaily via tube  Free water flushes 20036m4 hours   Regimen provides 2200kcal/day, 108g/day protein and 2300m72my free water   NUTRITION DIAGNOSIS:   Inadequate oral intake related to inability to eat (pt sedated and ventilated) as evidenced by NPO status.  GOAL:   Provide needs based on ASPEN/SCCM guidelines -met with tube feeds   MONITOR:   Vent status, Labs, Weight trends, Skin, I & O's, TF tolerance  ASSESSMENT:   55 y51 female with h/o etoh abuse, anxiety, depression and cirrhosis who is admitted with encephalopathy.  Pt remains sedated and ventilated. OGT in place. Pt tolerating tube feeds well at goal rate. Per chart, pt is up ~24lbs since admit; pt + 13.4L on her I & Os. Plan is for trach/PEG next week.   Medications reviewed and include: diamox, vitamin C, aspirin, colace, folic acid, heparin, insulin, lactulose, MVI, protonix, miralax, thiamine, zinc, 5% dextrose '@50ml' /hr  Labs reviewed: Na 150(H), K 3.9 wnl, BUN 51(H), P 2.4(L), Mg 2.0 wnl Wbc- 25.6(H), Hgb 8.3(L), Hct 26.5(L) Cbgs- 176, 156, 147 x 24 hrs  Patient is currently intubated on ventilator support MV: 8.4 L/min Temp (24hrs), Avg:100.1 F (37.8 C), Min:99.7 F (37.6 C), Max:100.6 F (38.1 C)  Propofol: none   MAP- >65mm63mUOP- 2650ml 71met Order:   Diet Order             Diet NPO time specified  Diet effective now                  EDUCATION NEEDS:   No education needs have been identified at this time  Skin:  Skin Assessment: Reviewed RN Assessment (ecchymosis)  Last BM:  11/2- type 7  Height:   Ht Readings from Last 1 Encounters:  01/19/21 '5\' 5"'  (1.651 m)    Weight:   Wt Readings from Last 1 Encounters:  02/03/21 90.5 kg    Ideal Body Weight:  56.8 kg  BMI:  Body mass index is 33.2 kg/m.  Estimated Nutritional Needs:   Kcal:   1800-2100kcal/day  Protein:  > 110 grams  Fluid:  1.7-2.0L/day  Keairra Bardon Koleen DistanceD, LDN Please refer to [facility] and/or RD on-call/weekend/after hours pager

## 2021-02-03 NOTE — Progress Notes (Signed)
NAME:  [name], MRN:  [id number], DOB:  Apr 01, 1966, LOS: 37 ADMISSION DATE:  01/20/2021, CONSULTATION DATE:  01/24/21 REFERRING MD:  [name], CHIEF COMPLAINT:  unresponsive   History of Present Illness:  55 yo F wthi hx of MDD, Anxiety disorder, osteomyelitis of left tibia, left tibial fracture history, recurrent UTIs, EtOH abuse and liver Cirrhosis ([place]), active alcoholism, tobacco abuse with multiple readmissions to [facility] with EtOH withdrawal syndrome and now being brought into MICU due to unstable vital signs and aggitated hyperactive delerium with concerns for delerium tremens. Blood work is notable for transaminitis with decreased synthetic function.     Patient had ziplock bag with two teeth in there due to loose teeth in mouth. Husband shares that patient has few false teeth that she had created herself.    Ive met with husband and son and explained that patient is unresponsive with GCS7 and is not able to protect airway. They understand the severity of situation and agree for intubation if needed. We discussed goals of care and patient is full code.   Pertinent  Medical History  MDD Anxiety Alcohol use disorder Alcoholic cirrhosis Smoking  Significant [facility] Events: Including procedures, antibiotic start and stop dates in addition to other pertinent events   10/19 Pt admitted to the [name] with delirium tremors secondary to ETOH withdrawal  10/19: CT Head negative  10/19: CT Abd/Pelvis revealed Hepatomegaly with heterogeneous liver enhancement and evidence of portal venous hypertension including recanalized paraumbilical vein, small spleno-renal varices, and small volume ascites. Chronic liver disease strongly suspected although the enhancement pattern might indicate a superimposed acute hepatitis. No evidence of bile duct obstruction. No discrete liver lesion. No obstructive uropathy but absent renal contrast excretion on the delayed images suggesting acute renal  insufficiency. Punctate nephrolithiasis. Borderline to mild cardiomegaly. Bilateral lower lobe atelectasis. Mild aortic atherosclerosis.  10/24 Transfer to ICU, intubation, CVL placed 10/25: Echo revealed EF 60 to 65% 10/28: Pt with hepatic encephalopathy currently undergoing SBT 10/5.  14L positive will administer 40 mg iv lasix x1 dose  10/28: CT Head motion limited exam. Allowing for this, no acute intracranial abnormality. 10/28: CT Maxillofacial revealed prominent dental caries within the residual right maxillary molar. No associated inflammation. No other focal dental or periodontal disease to suggest infection. Minimal inflammatory changes about the lateral right orbit. No abscess. 10/31: CTA Head revealed no acute intracranial abnormality. A 6 mm left paraclinoid ICA aneurysm. Consult to neuro intervention suggested. No intracranial large vessel occlusion or hemodynamically significant stenosis. Intracranial stenosis described on prior MR angiogram are likely artifactual. Patent major neck arteries without hemodynamically significant stenosis. 10/31: MRA Head/Neck revealed medially directed 6 mm aneurysm from the distal left cavernous carotid/ophthalmic segment. Poor visualization of the right cavernous carotid, which may indicate slow flow or stenosis. Consider CTA for further evaluation. Apparent narrowing of the distal basilar artery, which may be artifactual or indicate focal stenosis. No hemodynamically significant stenosis in the neck. No large vessel occlusion. 10/31: EEG suggestive of moderate to severe diffuse encephalopathy, nonspecific etiology. No seizures or epileptiform discharges were seen throughout the recording.  11/1: Pt remains severely encephalopathic despite sedation remaining off since 10/31 _0 :22 am  11/2: Remains encephalopathic off sedation.  ENT consulted for [name], family wishes to proceed next week.  Interim History / Subjective:  -No significant events  overnight -Remains encephalopathic, off sedation -Will grimace and slightly withdraw to pain, but does not follow commands -ENT consulted yesterday for [name] ~ family wishes to  proceed next week  -Na slowly trending up, 150 today (148 yesterday).  Will hold off on diuresis today and will increase Free water flushes -Low grade fever last night, T max 100.6 -Leukocytosis slowly improving to 25.6 (27.8) -Creatinine normal, diuresed 2.6 L yesterday (net + 13 L since admit)   Objective   Blood pressure (!) 124/57, pulse 92, temperature 99.9 F (37.7 C), resp. rate 18, height _0  (1.651 m), weight 90.5 kg, last menstrual period 09/07/2015, SpO2 94 %.    Vent Mode: PRVC FiO2 (%):  [45 %-55 %] 45 % Set Rate:  [16 bmp-20 bmp] 20 bmp Vt Set:  [400 mL] 400 mL PEEP:  [5 cmH20] 5 cmH20 Plateau Pressure:  [14 cmH20-17 cmH20] 14 cmH20   Intake/Output Summary (Last 24 hours) at 02/03/2021 0836 Last data filed at 02/03/2021 0600 Gross per 24 hour  Intake 2925.51 ml  Output 3600 ml  Net -674.49 ml    Filed Weights   01/31/21 0414 02/01/21 0425 02/03/21 0415  Weight: 94.1 kg 92.3 kg 90.5 kg    Examination: General: acutely ill appearing female, NAD, mechanically intubated  HENT: Atraumatic, normocephalic, supple, no JVD  Lungs: diffuse rhonchi throughout, even, non labored, overbreathes the vent Cardiovascular: Regular rhythm, sinus tachycardia on telemetry, no R/G, 2+ radial/1+ distal pulses, 2+ generalized edema  Abdomen: +BS x4, soft, obese, abdominal distension present  Extremities: normal bulk and tone Neuro: not following commands, facial grimaces to pain and gag reflex present when suctioned , PERRL, icteric sclera  GU: indwelling foley catheter draining dark yellow urine    Resolved [facility] Problem list     Assessment & Plan:   Acute encephalopathy multifactorial secondary acute/subacute ischemic pontine infarct, hyperammonemia and infectious process  Moderate-severe  alcohol withdrawal with delirium Sedation needs in setting of mechanical ventilation -Continue to hold all sedation for now to assess neurological status  -Neurology consulted, appreciate input~per neuro aneurysm on CTA Head/Neck and MRA Head Neck can be followed in the outpatient setting  -Monitor CIWA protocol -Continue thiamine, folate and multivitamins -Continue lactulose 30 g per tube daily and rifaximin  -Trend ammonia   UTI due to E. coli/Klebsiella~completed course of ceftriaxone  Worsening leukocytosis secondary to questionable pneumonia  -Monitor fever curve -Trend WBC's & Procalcitonin -Follow cultures as above -Zosyn discontinued (10/29 - 11/1) as follow up Tracheal aspirate from 10/28 with rare normal respiratory flora  Acute hypoxic respiratory failure in the setting of Hepatic Encephalopathy and suspected Pneumonia -Full vent support, implement lung protective strategies -Plateau pressures less than 30 cm H20 -Wean FiO2 & PEEP as tolerated to maintain O2 sats >92% -Follow intermittent Chest X-ray & ABG as needed -Spontaneous Breathing Trials when respiratory parameters met and mental status permits -Mental Status is currently Precluding weaning -Will need TRACH ~ ENT consulted, family wishes to proceed with [facility] next week -Implement VAP Bundle -Prn Bronchodilators -Holding diuresis today due to worsening Hypernatremia  Acute alcoholic hepatitis Ascites  -Monitor hepatic function panel  -US Abdomen 11/1 with small to moderate volume ascites ~ consider Paracentesis  Hypernatremia  -Monitor I&O's / urinary output -Follow BMP -Ensure adequate renal perfusion -Avoid nephrotoxic agents as able -Replace electrolytes as indicated -Hold Diuresis today 11/2 -Increase free water flushes to 200 cc q4h  Severe protein calorie malnutrition Dietitian consulted appreciate input~continue tube feeds  Macrocytic anemia due to folate deficiency Thrombocytopenia Likely  driven by liver disease/alcoholism -Monitor for S/Sx of bleeding -Trend CBC -Heparin SQ for VTE Prophylaxis  -Transfuse for Hgb <7 -  Continue folic acid supplementation   Best Practice (right click and "Reselect all SmartList Selections" daily)   Diet/type: tubefeeds DVT prophylaxis: SCD; subq heparin  GI prophylaxis: PPI Lines: [name] line Foley:  Yes and still indicated  Code Status:  full code Last date of multidisciplinary goals of care discussion [02/03/2021]  Critical care time: 40 minutes    Darel Hong, AGACNP-BC [facility] Prefer epic messenger for cross cover needs If after hours, please call E-link

## 2021-02-03 NOTE — Progress Notes (Signed)
Updated pt's husband at bedside.  All questions answered.  He is appreciative of update.     Harlon Ditty, AGACNP-BC [facility] Prefer epic messenger for cross cover needs If after hours, please call E-link

## 2021-02-04 ENCOUNTER — Inpatient Hospital Stay: Payer: BC Managed Care – PPO

## 2021-02-04 DIAGNOSIS — G9341 Metabolic encephalopathy: Secondary | ICD-10-CM | POA: Diagnosis not present

## 2021-02-04 LAB — CBC WITH DIFFERENTIAL/PLATELET
Abs Immature Granulocytes: 0.73 10*3/uL — ABNORMAL HIGH (ref 0.00–0.07)
Basophils Absolute: 0.1 10*3/uL (ref 0.0–0.1)
Basophils Relative: 0 %
Eosinophils Absolute: 0.6 10*3/uL — ABNORMAL HIGH (ref 0.0–0.5)
Eosinophils Relative: 2 %
HCT: 25 % — ABNORMAL LOW (ref 36.0–46.0)
Hemoglobin: 7.5 g/dL — ABNORMAL LOW (ref 12.0–15.0)
Immature Granulocytes: 3 %
Lymphocytes Relative: 10 %
Lymphs Abs: 2.5 10*3/uL (ref 0.7–4.0)
MCH: 38.7 pg — ABNORMAL HIGH (ref 26.0–34.0)
MCHC: 30 g/dL (ref 30.0–36.0)
MCV: 128.9 fL — ABNORMAL HIGH (ref 80.0–100.0)
Monocytes Absolute: 1.7 10*3/uL — ABNORMAL HIGH (ref 0.1–1.0)
Monocytes Relative: 7 %
Neutro Abs: 18.2 10*3/uL — ABNORMAL HIGH (ref 1.7–7.7)
Neutrophils Relative %: 78 %
Platelets: 254 10*3/uL (ref 150–400)
RBC: 1.94 MIL/uL — ABNORMAL LOW (ref 3.87–5.11)
RDW: 26.6 % — ABNORMAL HIGH (ref 11.5–15.5)
Smear Review: NORMAL
WBC: 23.7 10*3/uL — ABNORMAL HIGH (ref 4.0–10.5)
nRBC: 0.5 % — ABNORMAL HIGH (ref 0.0–0.2)

## 2021-02-04 LAB — PROTIME-INR
INR: 1.3 — ABNORMAL HIGH (ref 0.8–1.2)
Prothrombin Time: 15.9 seconds — ABNORMAL HIGH (ref 11.4–15.2)

## 2021-02-04 LAB — PROTEIN, PLEURAL OR PERITONEAL FLUID: Total protein, fluid: <3 g/dL

## 2021-02-04 LAB — GLUCOSE, CAPILLARY
Glucose-Capillary: 151 mg/dL — ABNORMAL HIGH (ref 70–99)
Glucose-Capillary: 167 mg/dL — ABNORMAL HIGH (ref 70–99)
Glucose-Capillary: 169 mg/dL — ABNORMAL HIGH (ref 70–99)
Glucose-Capillary: 170 mg/dL — ABNORMAL HIGH (ref 70–99)
Glucose-Capillary: 171 mg/dL — ABNORMAL HIGH (ref 70–99)
Glucose-Capillary: 197 mg/dL — ABNORMAL HIGH (ref 70–99)
Glucose-Capillary: 197 mg/dL — ABNORMAL HIGH (ref 70–99)

## 2021-02-04 LAB — COMPREHENSIVE METABOLIC PANEL
ALT: 97 U/L — ABNORMAL HIGH (ref 0–44)
AST: 225 U/L — ABNORMAL HIGH (ref 15–41)
Albumin: 2.2 g/dL — ABNORMAL LOW (ref 3.5–5.0)
Alkaline Phosphatase: 124 U/L (ref 38–126)
Anion gap: 5 (ref 5–15)
BUN: 44 mg/dL — ABNORMAL HIGH (ref 6–20)
CO2: 37 mmol/L — ABNORMAL HIGH (ref 22–32)
Calcium: 8.9 mg/dL (ref 8.9–10.3)
Chloride: 108 mmol/L (ref 98–111)
Creatinine, Ser: 0.6 mg/dL (ref 0.44–1.00)
GFR, Estimated: >60 mL/min (ref >60)
Glucose, Bld: 195 mg/dL — ABNORMAL HIGH (ref 70–99)
Potassium: 3.4 mmol/L — ABNORMAL LOW (ref 3.5–5.1)
Sodium: 150 mmol/L — ABNORMAL HIGH (ref 135–145)
Total Bilirubin: 4.3 mg/dL — ABNORMAL HIGH (ref 0.3–1.2)
Total Protein: 6.5 g/dL (ref 6.5–8.1)

## 2021-02-04 LAB — BODY FLUID CELL COUNT WITH DIFFERENTIAL
Eos, Fluid: 0 %
Lymphs, Fluid: 40 %
Monocyte-Macrophage-Serous Fluid: 48 %
Neutrophil Count, Fluid: 12 %
Other Cells, Fluid: 0 %
Total Nucleated Cell Count, Fluid: 54 cu mm

## 2021-02-04 LAB — AMMONIA: Ammonia: 65 umol/L — ABNORMAL HIGH (ref 9–35)

## 2021-02-04 LAB — LACTATE DEHYDROGENASE, PLEURAL OR PERITONEAL FLUID: LD, Fluid: 42 U/L — ABNORMAL HIGH (ref 3–23)

## 2021-02-04 LAB — MAGNESIUM: Magnesium: 2.1 mg/dL (ref 1.7–2.4)

## 2021-02-04 LAB — PHOSPHORUS: Phosphorus: 2.5 mg/dL (ref 2.5–4.6)

## 2021-02-04 IMAGING — US US PARACENTESIS
1 series · 7 of 7 positions shown · non-contrast
Comparison: none

INDICATION: Ascites.

[Series 1: us paracentesis · 0.23mm/px · 7 of 7 slices shown]
[im 1/7]
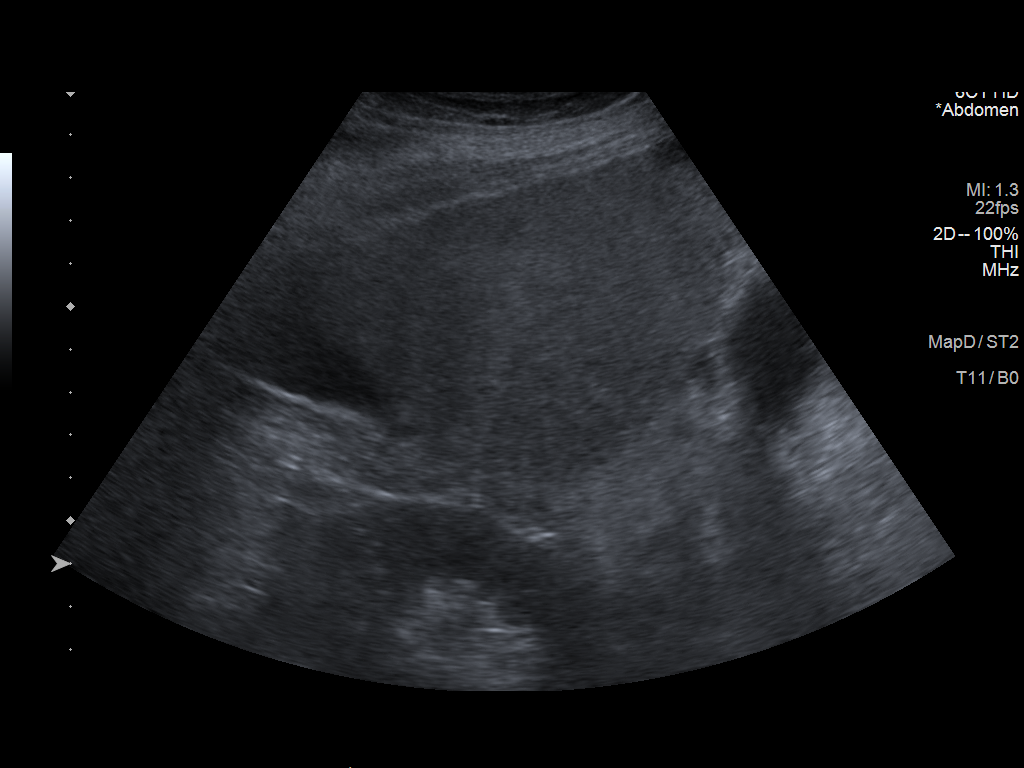
[im 2/7]
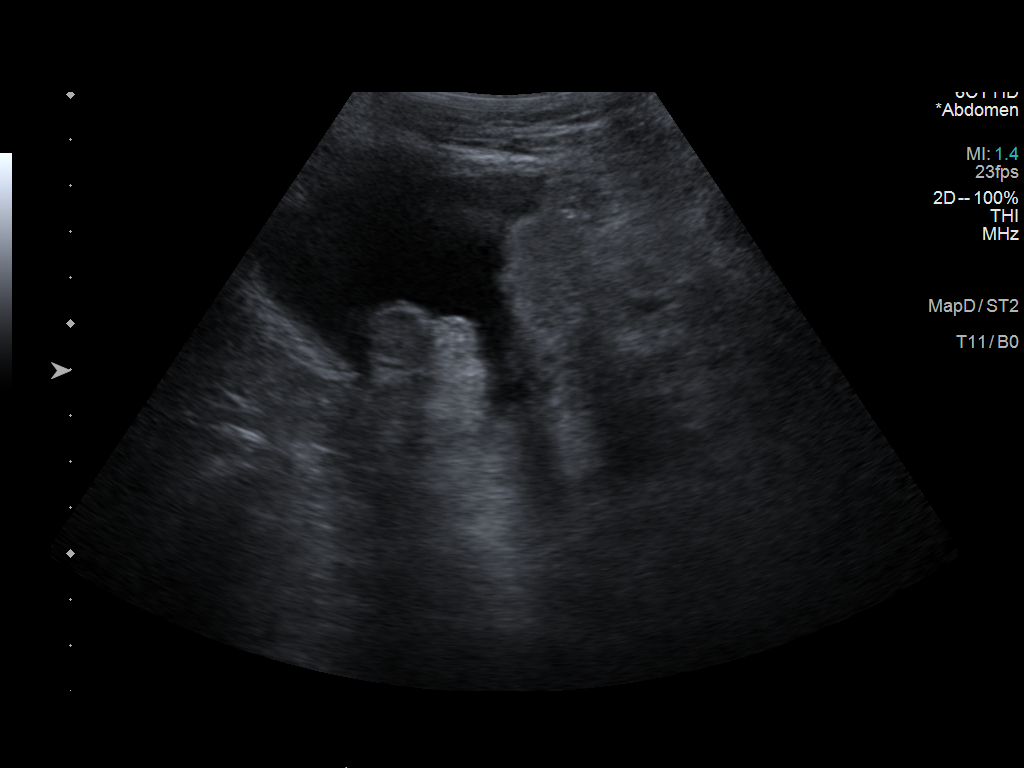
[im 3/7]
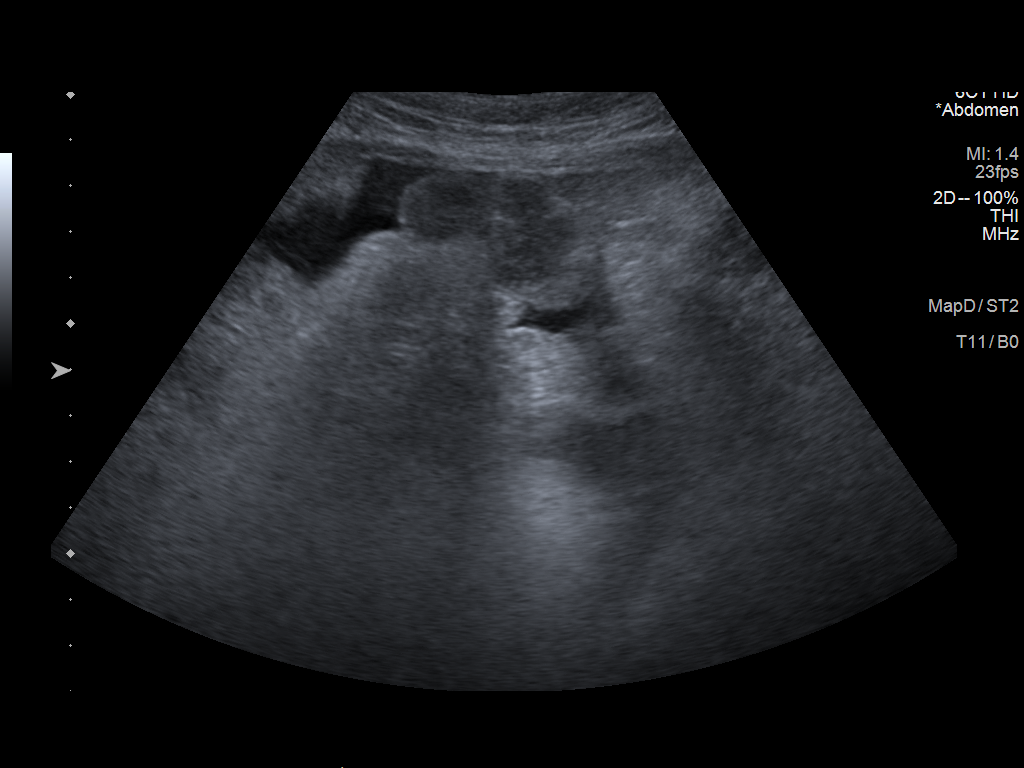
[im 4/7]
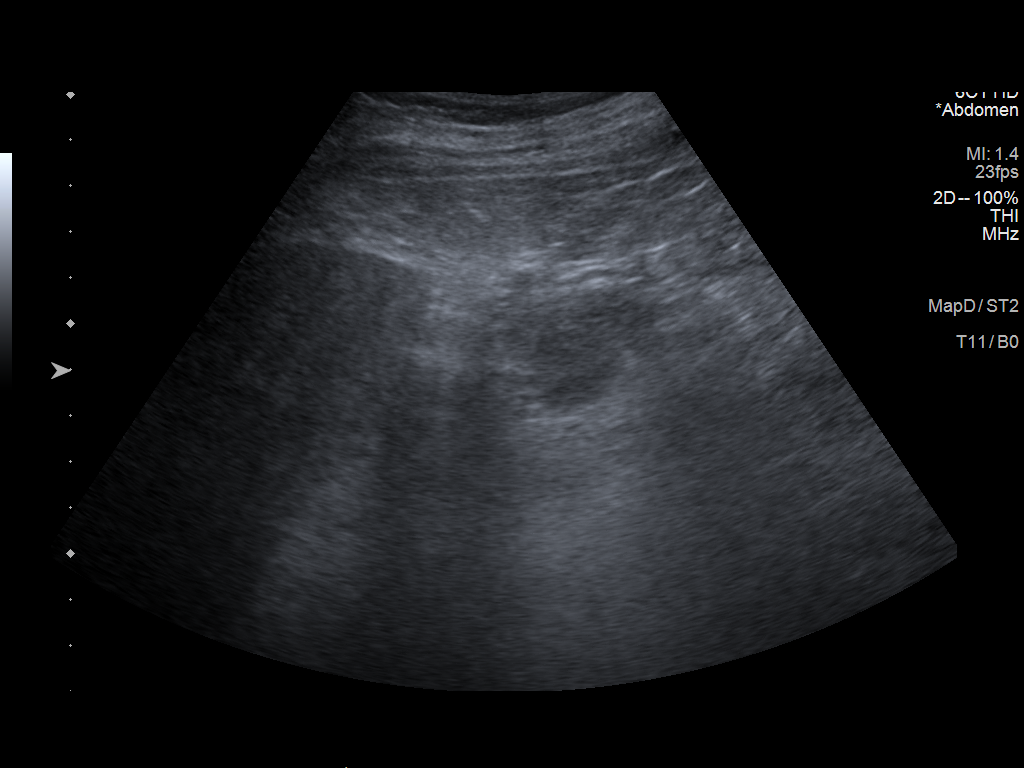
[im 5/7]
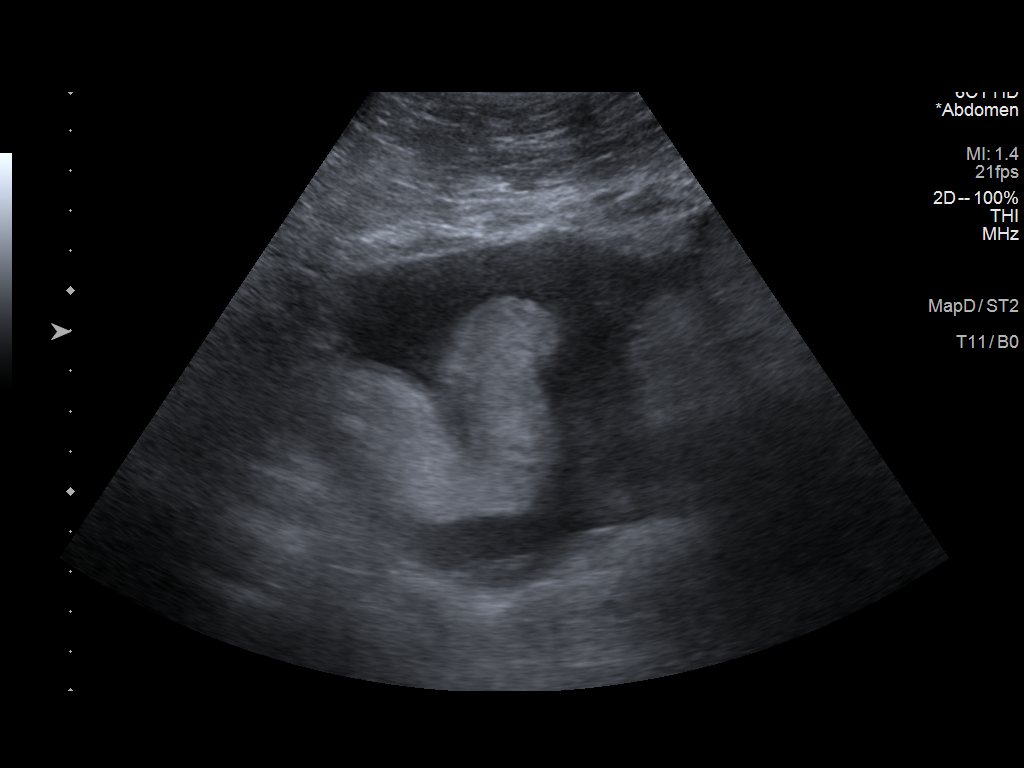
[im 6/7]
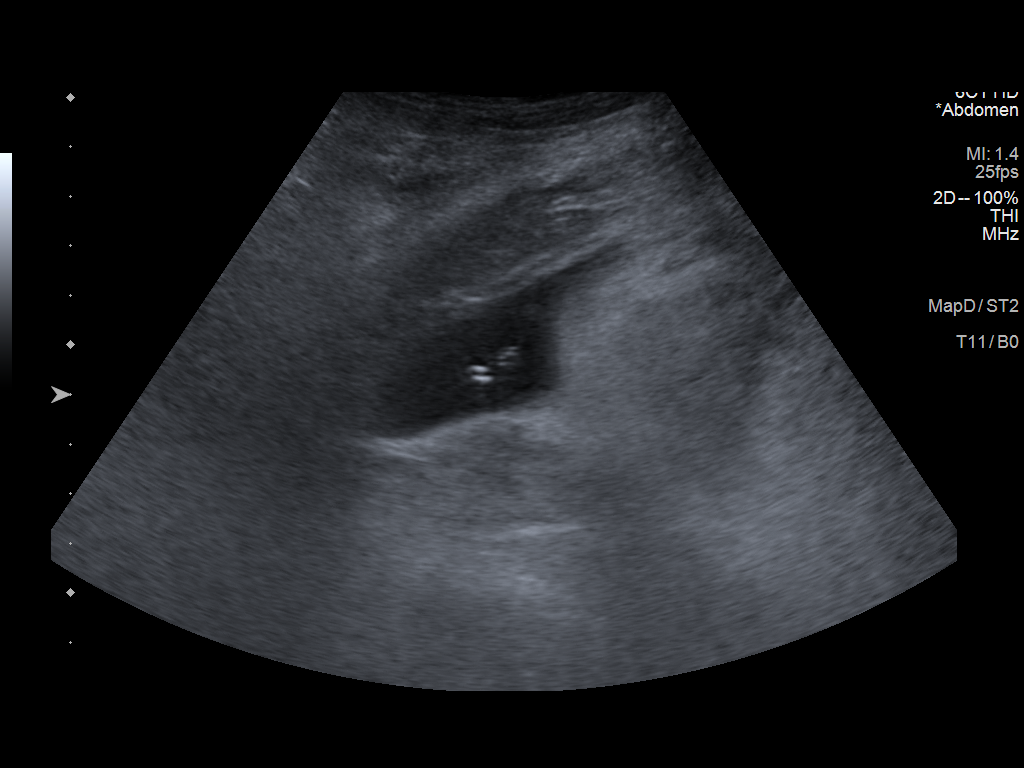
[im 7/7]
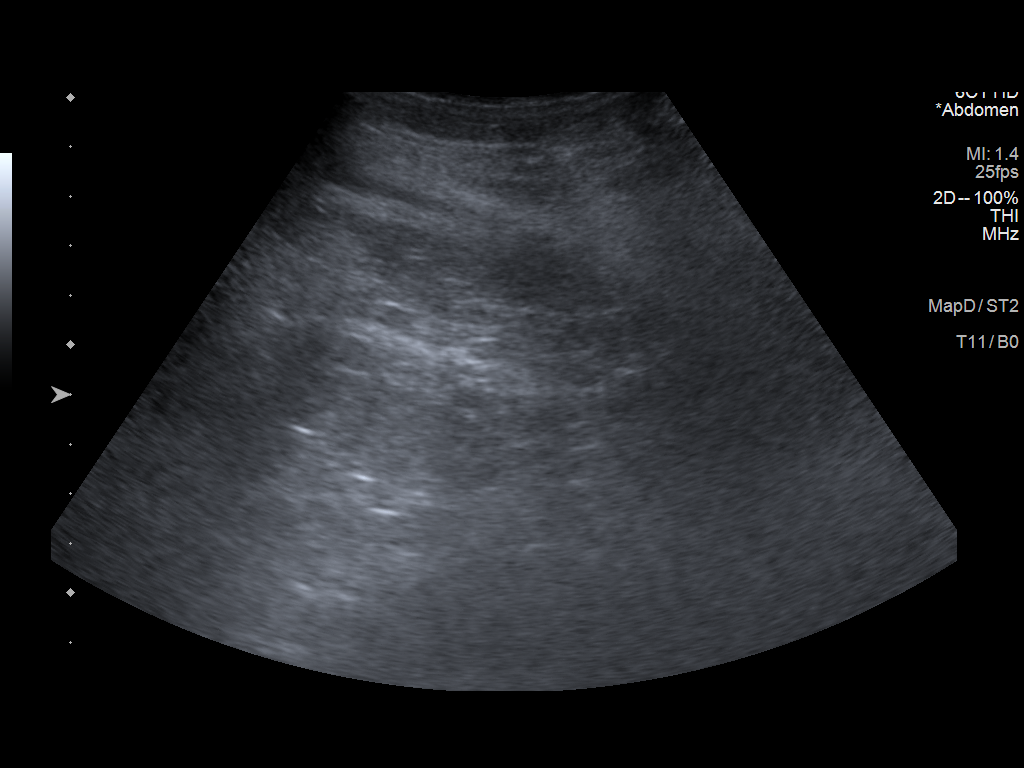

[7 of 7 positions shown; findings below may reference images not displayed]

EXAM:
ULTRASOUND GUIDED PARACENTESIS

MEDICATIONS:
None.

COMPLICATIONS:
None immediate.

PROCEDURE:
Informed written consent was obtained from the patient's husband
after a discussion of the risks, benefits and alternatives to
treatment. A timeout was performed prior to the initiation of the
procedure.

Initial ultrasound was performed to localize ascites. The right
lower abdomen was prepped and draped in the usual sterile fashion.
1% lidocaine was used for local anesthesia.

Following this, a 6 Fr Safe-T-Centesis catheter was introduced under
direct ultrasound guidance. An ultrasound image was saved for
documentation purposes. The paracentesis was performed. The catheter
was removed and a dressing was applied. The patient tolerated the
procedure well without immediate post procedural complication.
FINDINGS: A total of approximately 1.5 L of yellow, slightly turbid fluid was
removed. Samples were sent to the laboratory as requested by the
clinical team.
IMPRESSION: Successful ultrasound-guided paracentesis yielding 1.5 liters of
peritoneal fluid.

## 2021-02-04 IMAGING — DX DG CHEST 1V PORT
1 series · 1 of 1 positions shown · non-contrast
Comparison: Chest x-ray [DATE]

CLINICAL DATA: Hypoxia

EXAM:
PORTABLE CHEST 1 VIEW

[chest ap]
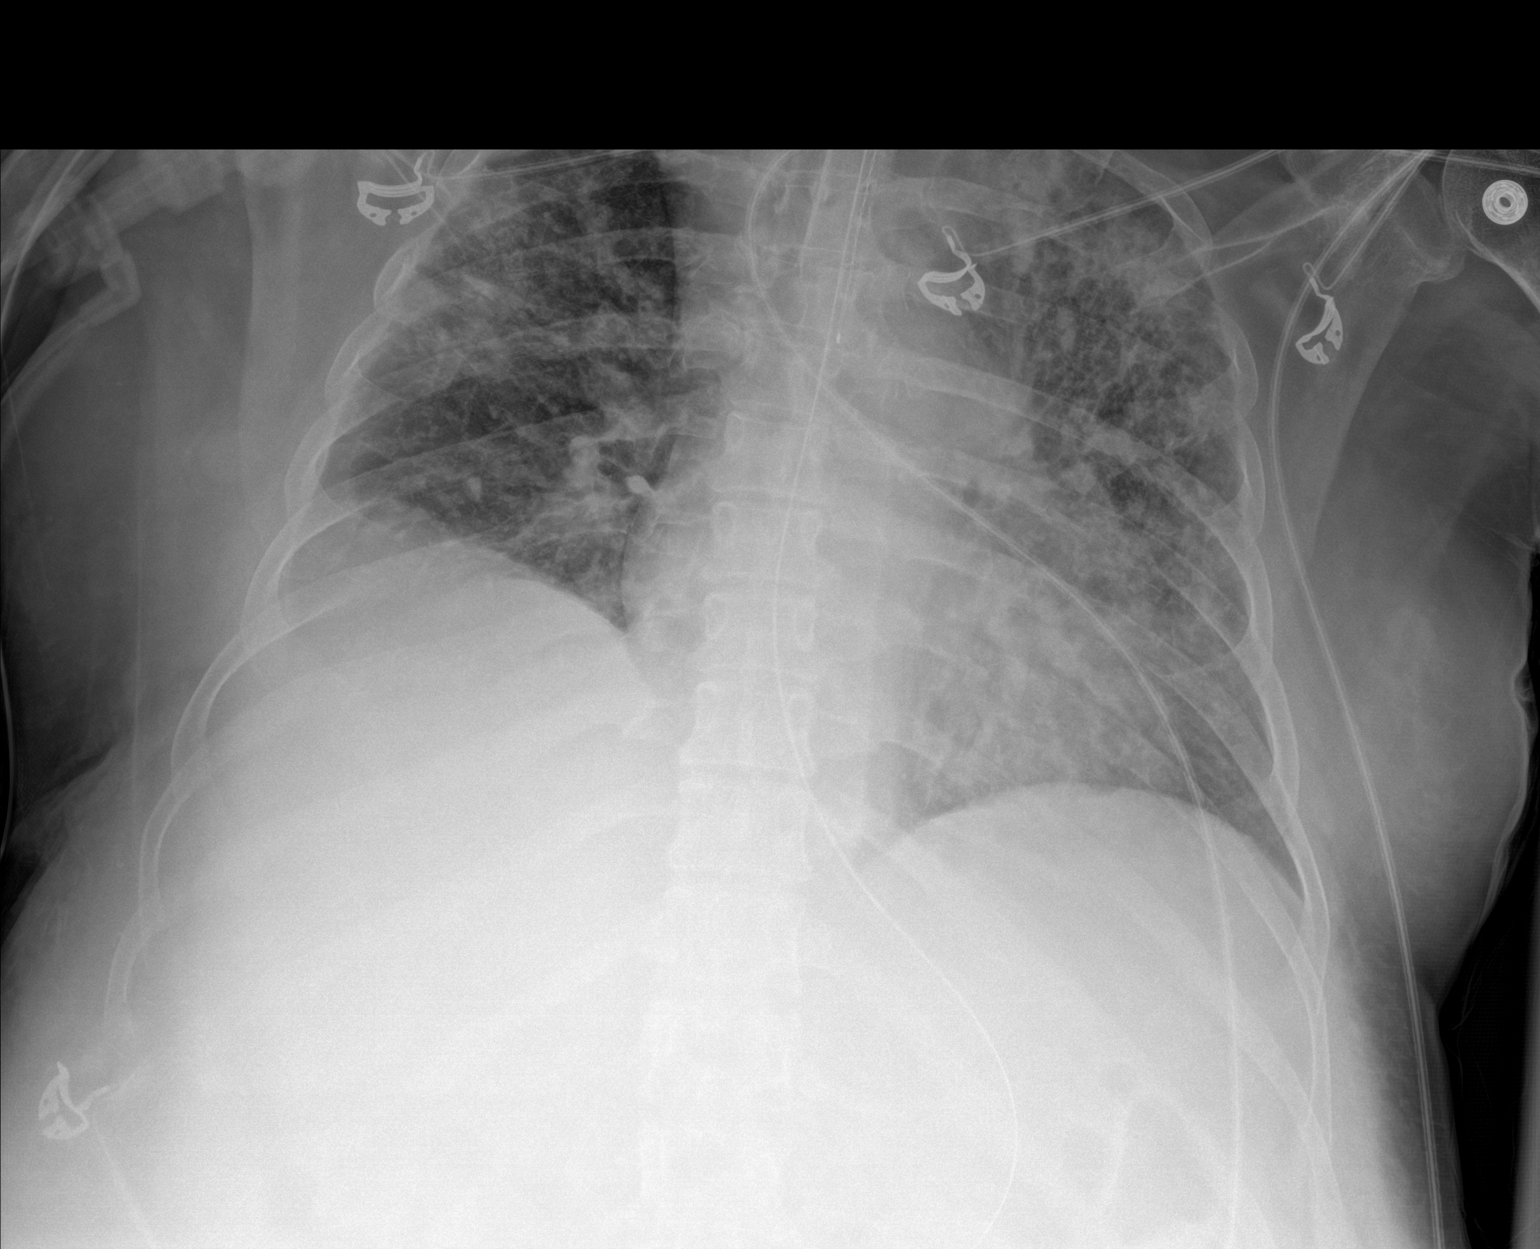

[1 of 1 positions shown; findings below may reference images not displayed]

FINDINGS: Endotracheal tube tip is 3.8 cm above the carina. Enteric tube tip
is below the diaphragm beyond the field of view.

Cardiomediastinal silhouette is grossly unchanged. Persistent
diffuse prominent interstitial and patchy irregular airspace
opacities bilaterally with improved aeration since previous study is
specially on the left. Possible small right pleural effusion. No
pneumothorax visualized.
IMPRESSION: 1. Medical devices as described.
2. Mild improved aeration of the lungs since previous study.

## 2021-02-04 MED ORDER — SODIUM CHLORIDE 0.9% FLUSH: 10.0000 mL | INTRAVENOUS | Status: DC | PRN

## 2021-02-04 NOTE — Progress Notes (Signed)
NAME:  [name], MRN:  [id number], DOB:  [birth year], LOS: 39 ADMISSION DATE:  01/20/2021, CONSULTATION DATE:  01/24/21 REFERRING MD:  Priscella Mann, CHIEF COMPLAINT:  unresponsive   History of Present Illness:  56 yo F wthi hx of MDD, Anxiety disorder, osteomyelitis of left tibia, left tibial fracture history, recurrent UTIs, EtOH abuse and liver Cirrhosis ([place]), active alcoholism, tobacco abuse with multiple readmissions to hospital with EtOH withdrawal syndrome and now being brought into MICU due to unstable vital signs and aggitated hyperactive delerium with concerns for delerium tremens. Blood work is notable for transaminitis with decreased synthetic function.     Patient had ziplock bag with two teeth in there due to loose teeth in mouth. Husband shares that patient has few false teeth that she had created herself.    Ive met with husband and son and explained that patient is unresponsive with GCS7 and is not able to protect airway. They understand the severity of situation and agree for intubation if needed. We discussed goals of care and patient is full code.   Pertinent  Medical History  MDD Anxiety Alcohol use disorder Alcoholic cirrhosis Smoking  Significant Hospital Events: Including procedures, antibiotic start and stop dates in addition to other pertinent events   10/19 Pt admitted to the [facility] unit with delirium tremors secondary to ETOH withdrawal  10/19: CT Head negative  10/19: CT Abd/Pelvis revealed Hepatomegaly with heterogeneous liver enhancement and evidence of portal venous hypertension including recanalized paraumbilical vein, small spleno-renal varices, and small volume ascites. Chronic liver disease strongly suspected although the enhancement pattern might indicate a superimposed acute hepatitis. No evidence of bile duct obstruction. No discrete liver lesion. No obstructive uropathy but absent renal contrast excretion on the delayed images suggesting acute renal  insufficiency. Punctate nephrolithiasis. Borderline to mild cardiomegaly. Bilateral lower lobe atelectasis. Mild aortic atherosclerosis.  10/24 Transfer to ICU, intubation, CVL placed 10/25: Echo revealed EF 60 to 65% 10/28: Pt with hepatic encephalopathy currently undergoing SBT 10/5.  14L positive will administer 40 mg iv lasix x1 dose  10/28: CT Head motion limited exam. Allowing for this, no acute intracranial abnormality. 10/28: CT Maxillofacial revealed prominent dental caries within the residual right maxillary molar. No associated inflammation. No other focal dental or periodontal disease to suggest infection. Minimal inflammatory changes about the lateral right orbit. No abscess. 10/31: CTA Head revealed no acute intracranial abnormality. A 6 mm left paraclinoid ICA aneurysm. Consult to neuro intervention suggested. No intracranial large vessel occlusion or hemodynamically significant stenosis. Intracranial stenosis described on prior MR angiogram are likely artifactual. Patent major neck arteries without hemodynamically significant stenosis. 10/31: MRA Head/Neck revealed medially directed 6 mm aneurysm from the distal left cavernous carotid/ophthalmic segment. Poor visualization of the right cavernous carotid, which may indicate slow flow or stenosis. Consider CTA for further evaluation. Apparent narrowing of the distal basilar artery, which may be artifactual or indicate focal stenosis. No hemodynamically significant stenosis in the neck. No large vessel occlusion. 10/31: EEG suggestive of moderate to severe diffuse encephalopathy, nonspecific etiology. No seizures or epileptiform discharges were seen throughout the recording.  11/1: Pt remains severely encephalopathic despite sedation remaining off since 10/31 '@11' :22 am  11/2: Remains encephalopathic off sedation.  ENT consulted for [facility], family wishes to proceed next week. 11/3: No change in mental status, remains encephalopathic.  Hypernatremia unchanged (150), increase Free Water flushes to 250 cc q4h, hold off on diuresis.  IR to evaluate for Paracentesis  Interim History / Subjective:  -No  significant events overnight -No change in mental status, Remains encephalopathic, off sedation for several days now -Will grimace and slightly withdraw to pain, but does not follow commands, cough/gag/corneal reflexes remain intact -Ammonia slightly improved today to 65 (70) -Hemodynamically stable, NO Vasopressors -UOP 725 ml last 24 hrs (net + 12.6 L since admit), Creatinine remains normal -Na+ unchanged at 150, increase free water flushes to 250 cc q4h, will hold Diuresis today -Low grade temperature overnight, T max overnight 100.2 -Leukocytosis continues to improve slowly to 23.7 (25.6) -Consult IR to evaluate for Paracentesis   Objective   Blood pressure (!) 124/54, pulse 93, temperature 100 F (37.8 C), resp. rate 20, height '5\' 5"'  (1.651 m), weight 91 kg, last menstrual period 09/07/2015, SpO2 91 %.    Vent Mode: PRVC FiO2 (%):  [45 %] 45 % Set Rate:  [20 bmp] 20 bmp Vt Set:  [400 mL] 400 mL PEEP:  [5 cmH20] 5 cmH20 Plateau Pressure:  [14 cmH20-16 cmH20] 16 cmH20   Intake/Output Summary (Last 24 hours) at 02/04/2021 0739 Last data filed at 02/04/2021 0601 Gross per 24 hour  Intake 2985.37 ml  Output 1925 ml  Net 1060.37 ml    Filed Weights   02/01/21 0425 02/03/21 0415 02/04/21 0500  Weight: 92.3 kg 90.5 kg 91 kg    Examination: General: acutely ill appearing female, NAD, mechanically intubated on no sedation HENT: Atraumatic, normocephalic, supple, no JVD  Lungs: diffuse rhonchi throughout, even, non labored, overbreathes the vent Cardiovascular: Regular rhythm, sinus tachycardia on telemetry, no R/G, 2+ radial/1+ distal pulses, 2+ generalized edema  Abdomen: +BS x4, soft, obese, abdominal distension present  Extremities: normal bulk and tone Neuro: not following commands, facial grimaces to pain,  cough/gag reflex present when suctioned , PERRL, icteric sclera  GU: indwelling foley catheter draining yellow urine    Resolved Hospital Problem list     Assessment & Plan:   Acute encephalopathy multifactorial secondary acute/subacute ischemic pontine infarct, hyperammonemia and infectious process  Moderate-severe alcohol withdrawal with delirium Sedation needs in setting of mechanical ventilation -Continue to hold all sedation for now to assess neurological status  -Neurology following, appreciate input~per neuro aneurysm on CTA Head/Neck and MRA Head Neck can be followed in the outpatient setting  -Monitor CIWA protocol -Continue thiamine, folate and multivitamins -Continue lactulose 30 g per tube daily and rifaximin  -Trend ammonia   UTI due to E. coli/Klebsiella~completed course of ceftriaxone  Worsening leukocytosis secondary to questionable pneumonia  -Monitor fever curve -Trend WBC's & Procalcitonin -Follow cultures as above -Zosyn discontinued (10/29 - 11/1) as follow up Tracheal aspirate from 10/28 with rare normal respiratory flora -IR consulted 11/3 to evaluate for Paracentesis  Acute hypoxic respiratory failure in the setting of Hepatic Encephalopathy and suspected Pneumonia -Full vent support, implement lung protective strategies -Plateau pressures less than 30 cm H20 -Wean FiO2 & PEEP as tolerated to maintain O2 sats >92% -Follow intermittent Chest X-ray & ABG as needed -Spontaneous Breathing Trials when respiratory parameters met and mental status permits -Mental Status is currently Precluding weaning -Will need TRACH ~ ENT consulted, family wishes to proceed with [facility] next week -Implement VAP Bundle -Prn Bronchodilators -Diuresis as BP and renal function permits  Acute alcoholic hepatitis Ascites  -Monitor hepatic function panel  -US Abdomen 11/1 with small to moderate volume ascites ~ IR consulted 11/3 to evaluate for Paracentesis  Hypernatremia   -Monitor I&O's / urinary output -Follow BMP -Ensure adequate renal perfusion -Avoid nephrotoxic agents as able -Replace electrolytes  as indicated -Hold Diuresis today 11/3 -Increase free water flushes to 250 cc q4h, continue D5 @ 50 ml/hr  Severe protein calorie malnutrition Dietitian consulted appreciate input~continue tube feeds  Macrocytic anemia due to folate deficiency Thrombocytopenia Likely driven by liver disease/alcoholism -Monitor for S/Sx of bleeding -Trend CBC -Heparin SQ for VTE Prophylaxis  -Transfuse for Hgb <7 -Continue folic acid supplementation   Best Practice (right click and "Reselect all SmartList Selections" daily)   Diet/type: tubefeeds DVT prophylaxis: SCD; subq heparin  GI prophylaxis: PPI Lines: N/A Foley:  Yes and still indicated  Code Status:  full code Last date of multidisciplinary goals of care discussion [02/04/2021]  Critical care time: 38 minutes    Darel Hong, AGACNP-BC  [facility] Prefer epic messenger for cross cover needs If after hours, please call E-link

## 2021-02-04 NOTE — Procedures (Signed)
Interventional Radiology Procedure Note  Procedure: US guided paracentesis  Complications: None  Estimated Blood Loss: None  Findings: 1.5 L of ascites removed.  Jodi Marble. Fredia Sorrow, M.D Pager:  [phone]

## 2021-02-04 NOTE — Progress Notes (Addendum)
Subjective: Continues to be intubated.   Objective: Current vital signs: BP (!) 124/55   Pulse 92   Temp 99.5 F (37.5 C)   Resp (!) 21   Ht 5' 5" (1.651 m)   Wt 91 kg   LMP 09/07/2015   SpO2 95%   BMI 33.38 kg/m  Vital signs in last 24 hours: Temp:  [99.2 F (37.3 C)-100.2 F (37.9 C)] 99.5 F (37.5 C) (11/03 1200) Pulse Rate:  [87-102] 92 (11/03 1200) Resp:  [18-26] 21 (11/03 1200) BP: (111-143)/(49-60) 124/55 (11/03 1200) SpO2:  [86 %-95 %] 95 % (11/03 1200) FiO2 (%):  [45 %] 45 % (11/03 1125) Weight:  [91 kg] 91 kg (11/03 0500)  Intake/Output from previous day: 11/02 0701 - 11/03 0700 In: 2985.4 [I.V.:1221.4; NG/GT:1764] Out: 1925 [Urine:725; Stool:1200] Intake/Output this shift: Total I/O In: -  Out: 1100 [Urine:750; Stool:350] Nutritional status:  Diet Order             Diet NPO time specified  Diet effective now                  HEENT: [place]/AT. Scleral edema with icterus again noted.  Lungs: Intubated Ext: No cyanosis or pallor  Ment: Furrows brow to noxious. No responses to verbal stimuli. No attempts to communicate except for unintelligible mouthing during one noxious stimulus. .  CN: PERRL and sluggish. Blinks to threat bilaterally Doll's eye reflex intact. No nystagmus. Eyes are conjugate. Face symmetric. Will furrow brow and blink briskly to corneal stimulation bilaterally. Motor/Sensory: Flaccid tone x 4. No movement to light noxious Reflexes: Hypoactive x 4. Toes mute bilaterally   Lab Results: Results for orders placed or performed during the hospital encounter of 01/20/21 (from the past 48 hour(s))  Protime-INR     Status: Abnormal   Collection Time: 02/02/21  3:40 PM  Result Value Ref Range   Prothrombin Time 15.9 (H) 11.4 - 15.2 seconds   INR 1.3 (H) 0.8 - 1.2    Comment: (NOTE) INR goal varies based on device and disease states. Performed at [facility], [address] [place], [place] [address]   Glucose, capillary      Status: Abnormal   Collection Time: 02/02/21  4:28 PM  Result Value Ref Range   Glucose-Capillary 234 (H) 70 - 99 mg/dL    Comment: Glucose reference range applies only to samples taken after fasting for at least 8 hours.  Glucose, capillary     Status: Abnormal   Collection Time: 02/02/21  7:46 PM  Result Value Ref Range   Glucose-Capillary 192 (H) 70 - 99 mg/dL    Comment: Glucose reference range applies only to samples taken after fasting for at least 8 hours.  Glucose, capillary     Status: Abnormal   Collection Time: 02/02/21 11:16 PM  Result Value Ref Range   Glucose-Capillary 189 (H) 70 - 99 mg/dL    Comment: Glucose reference range applies only to samples taken after fasting for at least 8 hours.  Glucose, capillary     Status: Abnormal   Collection Time: 02/03/21  4:09 AM  Result Value Ref Range   Glucose-Capillary 176 (H) 70 - 99 mg/dL    Comment: Glucose reference range applies only to samples taken after fasting for at least 8 hours.  Magnesium     Status: None   Collection Time: 02/03/21  4:37 AM  Result Value Ref Range   Magnesium 2.0 1.7 - 2.4 mg/dL    Comment:   Performed at [facility], [address] [place], [place] [address]  Phosphorus     Status: Abnormal   Collection Time: 02/03/21  4:37 AM  Result Value Ref Range   Phosphorus 2.4 (L) 2.5 - 4.6 mg/dL    Comment: Performed at [facility], [address] [place], [place] [address]  CBC with Differential/Platelet     Status: Abnormal   Collection Time: 02/03/21  4:37 AM  Result Value Ref Range   WBC 25.6 (H) 4.0 - 10.5 K/uL   RBC 2.12 (L) 3.87 - 5.11 MIL/uL   Hemoglobin 8.3 (L) 12.0 - 15.0 g/dL   HCT 26.5 (L) 36.0 - 46.0 %   MCV 125.0 (H) 80.0 - 100.0 fL   MCH 39.2 (H) 26.0 - 34.0 pg   MCHC 31.3 30.0 - 36.0 g/dL   RDW 26.1 (H) 11.5 - 15.5 %   Platelets 270 150 - 400 K/uL   nRBC 0.5 (H) 0.0 - 0.2 %   Neutrophils Relative % 80 %   Neutro Abs 20.4 (H) 1.7 - 7.7 K/uL   Lymphocytes  Relative 8 %   Lymphs Abs 2.1 0.7 - 4.0 K/uL   Monocytes Relative 9 %   Monocytes Absolute 2.2 (H) 0.1 - 1.0 K/uL   Eosinophils Relative 0 %   Eosinophils Absolute 0.0 0.0 - 0.5 K/uL   Basophils Relative 0 %   Basophils Absolute 0.1 0.0 - 0.1 K/uL   WBC Morphology MORPHOLOGY UNREMARKABLE    RBC Morphology MORPHOLOGY UNREMARKABLE    Smear Review Normal platelet morphology    Immature Granulocytes 3 %   Abs Immature Granulocytes 0.83 (H) 0.00 - 0.07 K/uL    Comment: Performed at [facility], [address] [place], [place] [address]  Comprehensive metabolic panel     Status: Abnormal   Collection Time: 02/03/21  4:37 AM  Result Value Ref Range   Sodium 150 (H) 135 - 145 mmol/L   Potassium 3.9 3.5 - 5.1 mmol/L   Chloride 108 98 - 111 mmol/L   CO2 34 (H) 22 - 32 mmol/L   Glucose, Bld 211 (H) 70 - 99 mg/dL    Comment: Glucose reference range applies only to samples taken after fasting for at least 8 hours.   BUN 51 (H) 6 - 20 mg/dL   Creatinine, Ser 0.70 0.44 - 1.00 mg/dL   Calcium 9.2 8.9 - 10.3 mg/dL   Total Protein 6.9 6.5 - 8.1 g/dL   Albumin 2.6 (L) 3.5 - 5.0 g/dL   AST 219 (H) 15 - 41 U/L   ALT 93 (H) 0 - 44 U/L   Alkaline Phosphatase 124 38 - 126 U/L   Total Bilirubin 4.9 (H) 0.3 - 1.2 mg/dL   GFR, Estimated >60 >60 mL/min    Comment: (NOTE) Calculated using the CKD-EPI Creatinine Equation (2021)    Anion gap 8 5 - 15    Comment: Performed at [facility], [address] [place], [place] [address]  Glucose, capillary     Status: Abnormal   Collection Time: 02/03/21  7:10 AM  Result Value Ref Range   Glucose-Capillary 156 (H) 70 - 99 mg/dL    Comment: Glucose reference range applies only to samples taken after fasting for at least 8 hours.  Glucose, capillary     Status: Abnormal   Collection Time: 02/03/21 11:04 AM  Result Value Ref Range   Glucose-Capillary 147 (H) 70 - 99 mg/dL    Comment: Glucose reference  range applies only to samples taken  after fasting for at least 8 hours.   Comment 1 Glucose Stabilizer   Glucose, capillary     Status: Abnormal   Collection Time: 02/03/21  4:56 PM  Result Value Ref Range   Glucose-Capillary 212 (H) 70 - 99 mg/dL    Comment: Glucose reference range applies only to samples taken after fasting for at least 8 hours.  Glucose, capillary     Status: Abnormal   Collection Time: 02/03/21  8:09 PM  Result Value Ref Range   Glucose-Capillary 176 (H) 70 - 99 mg/dL    Comment: Glucose reference range applies only to samples taken after fasting for at least 8 hours.  Glucose, capillary     Status: Abnormal   Collection Time: 02/04/21 12:21 AM  Result Value Ref Range   Glucose-Capillary 197 (H) 70 - 99 mg/dL    Comment: Glucose reference range applies only to samples taken after fasting for at least 8 hours.  Glucose, capillary     Status: Abnormal   Collection Time: 02/04/21  3:49 AM  Result Value Ref Range   Glucose-Capillary 171 (H) 70 - 99 mg/dL    Comment: Glucose reference range applies only to samples taken after fasting for at least 8 hours.  Glucose, capillary     Status: Abnormal   Collection Time: 02/04/21  7:42 AM  Result Value Ref Range   Glucose-Capillary 170 (H) 70 - 99 mg/dL    Comment: Glucose reference range applies only to samples taken after fasting for at least 8 hours.  Magnesium     Status: None   Collection Time: 02/04/21  9:03 AM  Result Value Ref Range   Magnesium 2.1 1.7 - 2.4 mg/dL    Comment: Performed at [facility], [address] [place], [place] [address]  Phosphorus     Status: None   Collection Time: 02/04/21  9:03 AM  Result Value Ref Range   Phosphorus 2.5 2.5 - 4.6 mg/dL    Comment: Performed at [facility], [address] [place], [place] [address]  CBC with Differential/Platelet     Status: Abnormal   Collection Time: 02/04/21  9:03 AM  Result Value Ref Range   WBC 23.7 (H) 4.0 - 10.5 K/uL   RBC 1.94 (L) 3.87 - 5.11 MIL/uL    Hemoglobin 7.5 (L) 12.0 - 15.0 g/dL   HCT 25.0 (L) 36.0 - 46.0 %   MCV 128.9 (H) 80.0 - 100.0 fL   MCH 38.7 (H) 26.0 - 34.0 pg   MCHC 30.0 30.0 - 36.0 g/dL   RDW 26.6 (H) 11.5 - 15.5 %   Platelets 254 150 - 400 K/uL   nRBC 0.5 (H) 0.0 - 0.2 %   Neutrophils Relative % 78 %   Neutro Abs 18.2 (H) 1.7 - 7.7 K/uL   Lymphocytes Relative 10 %   Lymphs Abs 2.5 0.7 - 4.0 K/uL   Monocytes Relative 7 %   Monocytes Absolute 1.7 (H) 0.1 - 1.0 K/uL   Eosinophils Relative 2 %   Eosinophils Absolute 0.6 (H) 0.0 - 0.5 K/uL   Basophils Relative 0 %   Basophils Absolute 0.1 0.0 - 0.1 K/uL   WBC Morphology MORPHOLOGY UNREMARKABLE    RBC Morphology ANISOCYTOSIS    Smear Review Normal platelet morphology    Immature Granulocytes 3 %   Abs Immature Granulocytes 0.73 (H) 0.00 - 0.07 K/uL   Polychromasia PRESENT    Stomatocytes PRESENT  Comment: Performed at [facility], [address] [place], [place] [address]  Comprehensive metabolic panel     Status: Abnormal   Collection Time: 02/04/21  9:03 AM  Result Value Ref Range   Sodium 150 (H) 135 - 145 mmol/L   Potassium 3.4 (L) 3.5 - 5.1 mmol/L   Chloride 108 98 - 111 mmol/L   CO2 37 (H) 22 - 32 mmol/L   Glucose, Bld 195 (H) 70 - 99 mg/dL    Comment: Glucose reference range applies only to samples taken after fasting for at least 8 hours.   BUN 44 (H) 6 - 20 mg/dL   Creatinine, Ser 0.60 0.44 - 1.00 mg/dL   Calcium 8.9 8.9 - 10.3 mg/dL   Total Protein 6.5 6.5 - 8.1 g/dL   Albumin 2.2 (L) 3.5 - 5.0 g/dL   AST 225 (H) 15 - 41 U/L   ALT 97 (H) 0 - 44 U/L   Alkaline Phosphatase 124 38 - 126 U/L   Total Bilirubin 4.3 (H) 0.3 - 1.2 mg/dL   GFR, Estimated >60 >60 mL/min    Comment: (NOTE) Calculated using the CKD-EPI Creatinine Equation (2021)    Anion gap 5 5 - 15    Comment: Performed at [facility], [address] [place], [place] [address]  Ammonia     Status: Abnormal   Collection Time: 02/04/21  9:03 AM  Result Value  Ref Range   Ammonia 65 (H) 9 - 35 umol/L    Comment: Performed at [facility], [address] [place], [place] [address]  Protime-INR     Status: Abnormal   Collection Time: 02/04/21  9:03 AM  Result Value Ref Range   Prothrombin Time 15.9 (H) 11.4 - 15.2 seconds   INR 1.3 (H) 0.8 - 1.2    Comment: (NOTE) INR goal varies based on device and disease states. Performed at [facility], [address] [place], [place] [address]   Glucose, capillary     Status: Abnormal   Collection Time: 02/04/21 11:14 AM  Result Value Ref Range   Glucose-Capillary 167 (H) 70 - 99 mg/dL    Comment: Glucose reference range applies only to samples taken after fasting for at least 8 hours.    Recent Results (from the past 240 hour(s))  Culture, Respiratory w Gram Stain     Status: None   Collection Time: 01/29/21  9:14 AM   Specimen: Tracheal Aspirate; Respiratory  Result Value Ref Range Status   Specimen Description   Final    TRACHEAL ASPIRATE Performed at [facility], [address] [place], [place] [address]    Special Requests   Final    NONE Performed at [facility], [address] [place], [place] [address]    Gram Stain   Final    ABUNDANT WBC PRESENT,BOTH PMN AND MONONUCLEAR RARE YEAST    Culture   Final    RARE Normal respiratory flora-no Staph aureus or Pseudomonas seen Performed at [facility], [address] [place], [place] [address]    Report Status 01/31/2021 FINAL  Final  MRSA Next Gen by PCR, Nasal     Status: None   Collection Time: 01/29/21 11:35 AM   Specimen: Nasal Mucosa; Nasal Swab  Result Value Ref Range Status   MRSA by PCR Next Gen NOT DETECTED NOT DETECTED Final    Comment: (NOTE) The GeneXpert MRSA Assay (FDA approved for NASAL specimens only), is one component   of a comprehensive MRSA colonization surveillance program. It is not intended to diagnose MRSA infection nor to guide or monitor treatment for MRSA  infections. Test performance is not FDA approved in patients less than 69 years old. Performed at [facility], [address] [place], [place] [address]     Lipid Panel No results for input(s): CHOL, TRIG, HDL, CHOLHDL, VLDL, LDLCALC in the last 72 hours.  Studies/Results: DG Chest Port 1 View  Result Date: 02/04/2021 CLINICAL DATA:  Hypoxia EXAM: PORTABLE CHEST 1 VIEW COMPARISON:  Chest x-ray 02/01/2021 FINDINGS: Endotracheal tube tip is 3.8 cm above the carina. Enteric tube tip is below the diaphragm beyond the field of view. Cardiomediastinal silhouette is grossly unchanged. Persistent diffuse prominent interstitial and patchy irregular airspace opacities bilaterally with improved aeration since previous study is specially on the left. Possible small right pleural effusion. No pneumothorax visualized. IMPRESSION: 1. Medical devices as described. 2. Mild improved aeration of the lungs since previous study. Electronically Signed   By: Ofilia Neas M.D.   On: 02/04/2021 08:07    Medications: Scheduled:  vitamin C  500 mg Per Tube BID   aspirin  81 mg Per Tube Daily   chlorhexidine gluconate (MEDLINE KIT)  15 mL Mouth Rinse BID   Chlorhexidine Gluconate Cloth  6 each Topical Daily   docusate  100 mg Per Tube BID   feeding supplement (PROSource TF)  45 mL Per Tube Daily   folic acid  1 mg Per Tube Daily   free water  200 mL Per Tube Q4H   heparin injection (subcutaneous)  5,000 Units Subcutaneous Q8H   influenza vac split quadrivalent PF  0.5 mL Intramuscular Tomorrow-1000   insulin aspart  0-15 Units Subcutaneous Q4H   lactulose  30 g Per Tube BID   mouth rinse  15 mL Mouth Rinse 10 times per day   midodrine  10 mg Per Tube TID WC   pantoprazole sodium  40 mg Per Tube Q1200   polyethylene glycol  17 g Per Tube Daily   rifaximin  550 mg Per Tube BID   sodium chloride flush  10-40 mL Intracatheter Q12H   thiamine injection  100 mg Intravenous Daily   zinc sulfate  220  mg Per Tube Daily   Continuous:  sodium chloride 10 mL/hr at 02/04/21 0601   dexmedetomidine (PRECEDEX) IV infusion Stopped (02/01/21 1122)   dextrose 50 mL/hr at 02/04/21 0601   feeding supplement (VITAL 1.5 CAL) 1,000 mL (02/04/21 0959)   norepinephrine (LEVOPHED) Adult infusion Stopped (01/30/21 1017)    Assessment: 55 yo woman with hx MDD, EtOH abuse (active c/b), liver cirrhosis, tobacco abuse, multiple previous hospital admissions for EtOH withdrawal who presented to ED 01/20/21 with hepatic encephalopathy, jaundice, and abd pain. She was admitted to floor but ultimately trasferred to ICU for unstable vital signs and hyperactive delirium c/f DTs. MRI brain 10/30 showed acute/subacute paramedian pontine ischemic infarct. Encephalopathy likely multifactorial in setting of hyperammonemia, hepatitis, aspiration PNA, acute/subacute ischemic pontine infarct.  - Exam essentially unchanged from Monday, except now blinks to threat and attempts to mouth one word during noxious stimuli.  - MRI brain: Acute/subacute 9 mm right paramedian nonhemorrhagic infarct in the central pons. Symmetric T2 signal changes and restricted diffusion in the thalami bilaterally. This is nonspecific, but may represent artery of [place] infarcts versus hepatic encephalopathy changes given the striking symmetry of the thalamic lesions - CTA head and neck: No acute intracranial abnormality. A 6 mm left paraclinoid  ICA aneurysm. Consult to neuro intervention suggested. No intracranial large vessel occlusion or hemodynamically significant stenosis. Intracranial stenosis described on prior MR angiogram are likely artifactual. Patent major neck arteries without hemodynamically significant stenosis. - EEG: This study is suggestive of moderate to severe diffuse encephalopathy, nonspecific etiology. No seizures or epileptiform discharges were seen throughout the recording.  - Encephalopathy is multifactorial. Main contributor is  hepatic encephalopathy with severe hyperammonemia. Subtle thalamic and basal ganglia diffuse signal changes on MRI are compatible with a severe metabolic encephalopathy. Other contributing factors include alcohol withdrawal, hospital delirium and infection.  - Moderate-severe alcohol withdrawal with delirium   Recommendations: - Monitor and manage her hyperammonemia and acute alcoholic hepatitis.  - Agree with thiamine and CIWA protocol - q4 hr neuro checks - Inpatient seizure precautions - STAT head CT for any change in neuro exam - Telemetry - PT/OT/SLP - Stroke education when she regains consciousness - ASA 18m daily - Would not resume statin now or in the future - Discussed the patient's neurological injuries (stroke and hyperammonemia with signal changes on MRI) with family as well as her current exam findings and prognosis. All questions answered. Educated family that improvement in mentation can lag improvements in serum ammonia levels by multiple days as it takes longer for CNS metabolite levels to equilibrate with serum levels.  - Ambulatory referral to neurology upon discharge   35 minutes spent in the neurological evaluation and management of this critically ill patient   LOS: 15 days   _0  signed: Dr. EKerney Elbe11/06/2020  2:15 PM

## 2021-02-04 NOTE — Progress Notes (Signed)
Updated pt's husband and and pt's aunt at bedside.  Discussed encephalopathy (multifactorial in etiology: pontine stroke, hepatic/hyperammonemia) precluding extubation.  Tentative plan for [facility] next week. Also discussed IR to evaluate for possible Paracentesis.   All questions answered.  Have requested Neurology to assist with discussion regarding prognostication for Neurological recovery.       Harlon Ditty, AGACNP-BC [facility] Prefer epic messenger for cross cover needs If after hours, please call E-link

## 2021-02-05 DIAGNOSIS — G9341 Metabolic encephalopathy: Secondary | ICD-10-CM | POA: Diagnosis not present

## 2021-02-05 LAB — CBC WITH DIFFERENTIAL/PLATELET
Abs Immature Granulocytes: 0.67 10*3/uL — ABNORMAL HIGH (ref 0.00–0.07)
Basophils Absolute: 0.2 10*3/uL — ABNORMAL HIGH (ref 0.0–0.1)
Basophils Relative: 1 %
Eosinophils Absolute: 0.7 10*3/uL — ABNORMAL HIGH (ref 0.0–0.5)
Eosinophils Relative: 3 %
HCT: 26.9 % — ABNORMAL LOW (ref 36.0–46.0)
Hemoglobin: 8.2 g/dL — ABNORMAL LOW (ref 12.0–15.0)
Immature Granulocytes: 3 %
Lymphocytes Relative: 10 %
Lymphs Abs: 2.3 10*3/uL (ref 0.7–4.0)
MCH: 38.3 pg — ABNORMAL HIGH (ref 26.0–34.0)
MCHC: 30.5 g/dL (ref 30.0–36.0)
MCV: 125.7 fL — ABNORMAL HIGH (ref 80.0–100.0)
Monocytes Absolute: 1.5 10*3/uL — ABNORMAL HIGH (ref 0.1–1.0)
Monocytes Relative: 7 %
Neutro Abs: 17.7 10*3/uL — ABNORMAL HIGH (ref 1.7–7.7)
Neutrophils Relative %: 76 %
Platelets: 240 10*3/uL (ref 150–400)
RBC: 2.14 MIL/uL — ABNORMAL LOW (ref 3.87–5.11)
RDW: 26.9 % — ABNORMAL HIGH (ref 11.5–15.5)
Smear Review: NORMAL
WBC: 23.1 10*3/uL — ABNORMAL HIGH (ref 4.0–10.5)
nRBC: 0.5 % — ABNORMAL HIGH (ref 0.0–0.2)

## 2021-02-05 LAB — COMPREHENSIVE METABOLIC PANEL
ALT: 99 U/L — ABNORMAL HIGH (ref 0–44)
AST: 210 U/L — ABNORMAL HIGH (ref 15–41)
Albumin: 2.3 g/dL — ABNORMAL LOW (ref 3.5–5.0)
Alkaline Phosphatase: 128 U/L — ABNORMAL HIGH (ref 38–126)
Anion gap: 7 (ref 5–15)
BUN: 35 mg/dL — ABNORMAL HIGH (ref 6–20)
CO2: 36 mmol/L — ABNORMAL HIGH (ref 22–32)
Calcium: 8.9 mg/dL (ref 8.9–10.3)
Chloride: 106 mmol/L (ref 98–111)
Creatinine, Ser: 0.49 mg/dL (ref 0.44–1.00)
GFR, Estimated: >60 mL/min (ref >60)
Glucose, Bld: 176 mg/dL — ABNORMAL HIGH (ref 70–99)
Potassium: 3.8 mmol/L (ref 3.5–5.1)
Sodium: 149 mmol/L — ABNORMAL HIGH (ref 135–145)
Total Bilirubin: 4.3 mg/dL — ABNORMAL HIGH (ref 0.3–1.2)
Total Protein: 6.5 g/dL (ref 6.5–8.1)

## 2021-02-05 LAB — GLUCOSE, CAPILLARY
Glucose-Capillary: 178 mg/dL — ABNORMAL HIGH (ref 70–99)
Glucose-Capillary: 165 mg/dL — ABNORMAL HIGH (ref 70–99)
Glucose-Capillary: 173 mg/dL — ABNORMAL HIGH (ref 70–99)
Glucose-Capillary: 177 mg/dL — ABNORMAL HIGH (ref 70–99)
Glucose-Capillary: 169 mg/dL — ABNORMAL HIGH (ref 70–99)
Glucose-Capillary: 171 mg/dL — ABNORMAL HIGH (ref 70–99)

## 2021-02-05 LAB — MAGNESIUM: Magnesium: 2 mg/dL (ref 1.7–2.4)

## 2021-02-05 LAB — PHOSPHORUS: Phosphorus: 2.5 mg/dL (ref 2.5–4.6)

## 2021-02-05 MED ORDER — IPRATROPIUM-ALBUTEROL 0.5-2.5 (3) MG/3ML IN SOLN
3.0000 mL | Freq: Three times a day (TID) | RESPIRATORY_TRACT | Status: DC
  Administered 2021-02-05 – 2021-02-13 (×23): 3 mL via RESPIRATORY_TRACT
  Filled 2021-02-05 (×23): qty 3

## 2021-02-05 NOTE — TOC Initial Note (Signed)
Transition of Care [facility]) - Initial/Assessment Note    Patient Details  Name: [name] Carrillo MRN: [id number] Date of Birth: Aug 11, 1965  [facility] CM/SW Contact:    Hetty Ely, RN Phone Number: 02/05/2021, 2:14 PM  Clinical Narrative: Patient is intubated/sedated in ICU at this time. Spoke with husband, [name] Carrillo who says patient did not have a PCP, worked in a HR office doing payroll. Lives at home with himself and 18yo son. Drives and use the [employer], however not currently on any prescribed medication because she did not go to the doctor. Patient independent with ADL's did walk with a limp due to having bilateral leg fractures. Discussed possibility of LTAC with husband and Aunt, they both agreed and did not have a choice just want to make sure it's covered by Insurance. TOC to continue to track.                  Expected Discharge Plan: IP Rehab Facility Barriers to Discharge: Continued Medical Work up   Patient Goals and CMS Choice Patient states their goals for this hospitalization and ongoing recovery are:: Patient intubated/sedated, husband receptive to [employer] Medicare.gov Compare Post Acute Care list provided to:: Legal Guardian Choice offered to / list presented to : Spouse  Expected Discharge Plan and Services Expected Discharge Plan: IP Rehab Facility     Post Acute Care Choice: NA Living arrangements for the past 2 months: Single Family Home                                      Prior Living Arrangements/Services Living arrangements for the past 2 months: Single Family Home Lives with:: Spouse, Adult Children Patient language and need for interpreter reviewed:: No (Assessment done with Husband, patient is intubated/sedated.) Do you feel safe going back to the place where you live?: Yes      Need for Family Participation in Patient Care: Yes (Comment) Care giver support system in place?: Yes (comment)   Criminal  Activity/Legal Involvement Pertinent to Current Situation/Hospitalization: No - Comment as needed  Activities of Daily Living Home Assistive Devices/Equipment: Eyeglasses ADL Screening (condition at time of admission) Patient's cognitive ability adequate to safely complete daily activities?: Yes Is the patient deaf or have difficulty hearing?: No Does the patient have difficulty seeing, even when wearing glasses/contacts?: No Does the patient have difficulty concentrating, remembering, or making decisions?: No Patient able to express need for assistance with ADLs?: Yes Does the patient have difficulty dressing or bathing?: No Independently performs ADLs?: Yes (appropriate for developmental age) Does the patient have difficulty walking or climbing stairs?: No Weakness of Legs: None Weakness of Arms/Hands: None  Permission Sought/Granted                  Emotional Assessment Appearance:: Appears stated age Attitude/Demeanor/Rapport: Unable to Assess Affect (typically observed): Unable to Assess Orientation: : Oriented to Self, Oriented to Place, Oriented to  Time, Oriented to Situation Alcohol / Substance Use: Not Applicable Psych Involvement: No (comment)  Admission diagnosis:  Cirrhosis of liver (HCC) [K74.60] Hypoxia [R09.02] Cirrhosis of liver with ascites, unspecified hepatic cirrhosis type (HCC) [K74.60, R18.8] Altered mental status, unspecified altered mental status type [R41.82] Acute metabolic encephalopathy [G93.41] Patient Active Problem List   Diagnosis Date Noted   Cirrhosis of liver (HCC) 01/20/2021   Acute metabolic encephalopathy 01/20/2021   Depression with anxiety  01/20/2021   Alcohol abuse 01/20/2021   Acute hepatic encephalopathy 01/20/2021   Abnormal LFTs 01/20/2021   Hyponatremia 01/20/2021   UTI (urinary tract infection) 01/20/2021   Macrocytic anemia 01/20/2021   Leukocytosis 01/20/2021   Acute respiratory failure with hypoxia (HCC) 01/20/2021    Tibia fracture 02/12/2017   PCP:  Patient, No Pcp Per (Inactive) Pharmacy:   [id number] [place], [place] - [address] AT [facility] [address] [place] [address] Phone: [phone] Fax: [phone]     Social Determinants of Health (SDOH) Interventions    Readmission Risk Interventions Readmission Risk Prevention Plan 02/05/2021  Transportation Screening Complete  PCP or Specialist Appt within 5-7 Days Not Complete  Not Complete comments NMS for Discharge  Home Care Screening Complete  Medication Review (RN CM) Complete  Some recent data might be hidden

## 2021-02-05 NOTE — Progress Notes (Signed)
NAME:  [name] Carrillo, MRN:  [id number], DOB:  1966-03-12, LOS: 59 ADMISSION DATE:  01/20/2021, CONSULTATION DATE:  01/24/21 REFERRING MD:  Priscella Mann, CHIEF COMPLAINT:  unresponsive   History of Present Illness:  55 yo F wthi hx of MDD, Anxiety disorder, osteomyelitis of left tibia, left tibial fracture history, recurrent UTIs, EtOH abuse and liver Cirrhosis ([place]), active alcoholism, tobacco abuse with multiple readmissions to hospital with EtOH withdrawal syndrome and now being brought into MICU due to unstable vital signs and aggitated hyperactive delerium with concerns for delerium tremens. Blood work is notable for transaminitis with decreased synthetic function.     Patient had ziplock bag with two teeth in there due to loose teeth in mouth. Husband shares that patient has few false teeth that she had created herself.    Ive met with husband and son and explained that patient is unresponsive with GCS7 and is not able to protect airway. They understand the severity of situation and agree for intubation if needed. We discussed goals of care and patient is full code.   Pertinent  Medical History  MDD Anxiety Alcohol use disorder Alcoholic cirrhosis Smoking  Significant Hospital Events: Including procedures, antibiotic start and stop dates in addition to other pertinent events   10/19 Pt admitted to the [facility] unit with delirium tremors secondary to ETOH withdrawal  10/19: CT Head negative  10/19: CT Abd/Pelvis revealed Hepatomegaly with heterogeneous liver enhancement and evidence of portal venous hypertension including recanalized paraumbilical vein, small spleno-renal varices, and small volume ascites. Chronic liver disease strongly suspected although the enhancement pattern might indicate a superimposed acute hepatitis. No evidence of bile duct obstruction. No discrete liver lesion. No obstructive uropathy but absent renal contrast excretion on the delayed images suggesting acute renal  insufficiency. Punctate nephrolithiasis. Borderline to mild cardiomegaly. Bilateral lower lobe atelectasis. Mild aortic atherosclerosis.  10/24 Transfer to ICU, intubation, CVL placed 10/25: Echo revealed EF 60 to 65% 10/28: Pt with hepatic encephalopathy currently undergoing SBT 10/5.  14L positive will administer 40 mg iv lasix x1 dose  10/28: CT Head motion limited exam. Allowing for this, no acute intracranial abnormality. 10/28: CT Maxillofacial revealed prominent dental caries within the residual right maxillary molar. No associated inflammation. No other focal dental or periodontal disease to suggest infection. Minimal inflammatory changes about the lateral right orbit. No abscess. 10/31: CTA Head revealed no acute intracranial abnormality. A 6 mm left paraclinoid ICA aneurysm. Consult to neuro intervention suggested. No intracranial large vessel occlusion or hemodynamically significant stenosis. Intracranial stenosis described on prior MR angiogram are likely artifactual. Patent major neck arteries without hemodynamically significant stenosis. 10/31: MRA Head/Neck revealed medially directed 6 mm aneurysm from the distal left cavernous carotid/ophthalmic segment. Poor visualization of the right cavernous carotid, which may indicate slow flow or stenosis. Consider CTA for further evaluation. Apparent narrowing of the distal basilar artery, which may be artifactual or indicate focal stenosis. No hemodynamically significant stenosis in the neck. No large vessel occlusion. 10/31: EEG suggestive of moderate to severe diffuse encephalopathy, nonspecific etiology. No seizures or epileptiform discharges were seen throughout the recording.  11/1: Pt remains severely encephalopathic despite sedation remaining off since 10/31 '@11' :22 am  11/2: Remains encephalopathic off sedation.  ENT consulted for [facility], family wishes to proceed next week. 11/3: No change in mental status, remains encephalopathic.  Hypernatremia unchanged (150), increase Free Water flushes to 250 cc q4h, hold off on diuresis.  S/p paracentesis with removal of 1.5L of fluid   Interim History /  Subjective:  No acute events overnight.  Pt remains mechanically intubated and off sedation, however unable to follow commands, opening eyes and blinking to vigorous stimulation.    Objective   Blood pressure 124/60, pulse 86, temperature 98.8 F (37.1 C), resp. rate 20, height '5\' 5"'  (1.651 m), weight 92.8 kg, last menstrual period 09/07/2015, SpO2 97 %.    Vent Mode: PRVC FiO2 (%):  [45 %] 45 % Set Rate:  [20 bmp] 20 bmp Vt Set:  [400 mL] 400 mL PEEP:  [5 cmH20] 5 cmH20 Plateau Pressure:  [16 cmH20] 16 cmH20   Intake/Output Summary (Last 24 hours) at 02/05/2021 0912 Last data filed at 02/05/2021 0600 Gross per 24 hour  Intake 5160.25 ml  Output 2600 ml  Net 2560.25 ml   Filed Weights   02/03/21 0415 02/04/21 0500 02/05/21 0500  Weight: 90.5 kg 91 kg 92.8 kg    Examination: General: Acutely ill appearing female, NAD, mechanically intubated on no sedation HENT: Atraumatic, normocephalic, supple, no JVD  Lungs: Faint rhonchi throughout, even, non labored Cardiovascular: Regular rhythm, sinus rhythm, no R/G, 2+ radial/1+ distal pulses, 1+ generalized edema  Abdomen: +BS x4, soft, obese, abdominal distension present  Extremities: Normal bulk and tone Neuro: Not following commands, opens eyes to vigorous stimulation, cough/gag reflex present when suctioned , PERRL, icteric sclera  GU: Indwelling foley catheter draining yellow urine    Resolved Hospital Problem list   Thrombocytopenia   Assessment & Plan:   Acute encephalopathy multifactorial secondary acute/subacute ischemic pontine infarct, hyperammonemia and infectious process  Moderate-severe alcohol withdrawal with delirium Sedation needs in setting of mechanical ventilation -Continue to hold all sedation for now to assess neurological status  -Neurology  following, appreciate input~per neuro aneurysm on CTA Head/Neck and MRA Head Neck can be followed in the outpatient setting  -Monitor CIWA protocol -Continue thiamine, folate and multivitamins -Continue lactulose 30 g per tube bid and rifaximin  -Trend ammonia   UTI due to E. coli/Klebsiella~completed course of ceftriaxone  Worsening leukocytosis secondary to questionable pneumonia  -Monitor fever curve -Trend WBC's & Procalcitonin -Follow cultures as above -Zosyn discontinued (10/29 - 11/1) as follow up Tracheal aspirate from 10/28 with rare normal respiratory flora  Acute hypoxic respiratory failure in the setting of Hepatic Encephalopathy and suspected Pneumonia -Full vent support, implement lung protective strategies -Plateau pressures less than 30 cm H20 -Wean FiO2 & PEEP as tolerated to maintain O2 sats >92% -Follow intermittent Chest X-ray & ABG as needed -Spontaneous Breathing Trials when respiratory parameters met and mental status permits -Mental Status is currently Precluding weaning -Will need TRACH ~ ENT consulted, family wishes to proceed with [facility] next week -Implement VAP Bundle -Prn Bronchodilators -Diuresis as BP and renal function permits  Acute alcoholic hepatitis Ascites  -Monitor hepatic function panel  -Pt s/p paracentesis with removal of 1.5L~11/3  Hypernatremia  -Monitor I&O's / urinary output -Follow BMP -Ensure adequate renal perfusion -Avoid nephrotoxic agents as able -Replace electrolytes as indicated -Continue free water flushes 200 cc q4h, continue D5 '@50'  ml/hr  Severe protein calorie malnutrition Dietitian consulted appreciate input~continue tube feeds  Macrocytic anemia due to folate deficiency Likely driven by liver disease/alcoholism -Monitor for S/Sx of bleeding -Trend CBC -Heparin SQ for VTE Prophylaxis  -Transfuse for Hgb <7 -Continue folic acid supplementation  Best Practice (right click and "Reselect all SmartList Selections"  daily)   Diet/type: tubefeeds DVT prophylaxis: SCD; subq heparin  GI prophylaxis: PPI Lines: N/A Foley:  Yes and still indicated  Code Status:  full code Last date of multidisciplinary goals of care discussion [02/05/2021]  Critical care time: 35 minutes     Rosilyn Mings, [place] Pager [phone] (please enter 7 digits) PCCM Consult Pager [phone] (please enter 7 digits)

## 2021-02-06 DIAGNOSIS — G9341 Metabolic encephalopathy: Secondary | ICD-10-CM | POA: Diagnosis not present

## 2021-02-06 LAB — CBC WITH DIFFERENTIAL/PLATELET
Abs Immature Granulocytes: 0.5 10*3/uL — ABNORMAL HIGH (ref 0.00–0.07)
Basophils Absolute: 0.1 10*3/uL (ref 0.0–0.1)
Basophils Relative: 0 %
Eosinophils Absolute: 0.7 10*3/uL — ABNORMAL HIGH (ref 0.0–0.5)
Eosinophils Relative: 3 %
HCT: 26.2 % — ABNORMAL LOW (ref 36.0–46.0)
Hemoglobin: 8.2 g/dL — ABNORMAL LOW (ref 12.0–15.0)
Immature Granulocytes: 2 %
Lymphocytes Relative: 9 %
Lymphs Abs: 2.1 10*3/uL (ref 0.7–4.0)
MCH: 38.9 pg — ABNORMAL HIGH (ref 26.0–34.0)
MCHC: 31.3 g/dL (ref 30.0–36.0)
MCV: 124.2 fL — ABNORMAL HIGH (ref 80.0–100.0)
Monocytes Absolute: 1.4 10*3/uL — ABNORMAL HIGH (ref 0.1–1.0)
Monocytes Relative: 6 %
Neutro Abs: 18.5 10*3/uL — ABNORMAL HIGH (ref 1.7–7.7)
Neutrophils Relative %: 80 %
Platelets: 248 10*3/uL (ref 150–400)
RBC: 2.11 MIL/uL — ABNORMAL LOW (ref 3.87–5.11)
RDW: 27 % — ABNORMAL HIGH (ref 11.5–15.5)
WBC: 23.2 10*3/uL — ABNORMAL HIGH (ref 4.0–10.5)
nRBC: 0.4 % — ABNORMAL HIGH (ref 0.0–0.2)

## 2021-02-06 LAB — COMPREHENSIVE METABOLIC PANEL
ALT: 94 U/L — ABNORMAL HIGH (ref 0–44)
AST: 192 U/L — ABNORMAL HIGH (ref 15–41)
Albumin: 2.3 g/dL — ABNORMAL LOW (ref 3.5–5.0)
Alkaline Phosphatase: 138 U/L — ABNORMAL HIGH (ref 38–126)
Anion gap: 4 — ABNORMAL LOW (ref 5–15)
BUN: 30 mg/dL — ABNORMAL HIGH (ref 6–20)
CO2: 36 mmol/L — ABNORMAL HIGH (ref 22–32)
Calcium: 8.7 mg/dL — ABNORMAL LOW (ref 8.9–10.3)
Chloride: 103 mmol/L (ref 98–111)
Creatinine, Ser: 0.45 mg/dL (ref 0.44–1.00)
GFR, Estimated: >60 mL/min (ref >60)
Glucose, Bld: 184 mg/dL — ABNORMAL HIGH (ref 70–99)
Potassium: 3.4 mmol/L — ABNORMAL LOW (ref 3.5–5.1)
Sodium: 143 mmol/L (ref 135–145)
Total Bilirubin: 3.9 mg/dL — ABNORMAL HIGH (ref 0.3–1.2)
Total Protein: 6.6 g/dL (ref 6.5–8.1)

## 2021-02-06 LAB — GLUCOSE, CAPILLARY
Glucose-Capillary: 162 mg/dL — ABNORMAL HIGH (ref 70–99)
Glucose-Capillary: 162 mg/dL — ABNORMAL HIGH (ref 70–99)
Glucose-Capillary: 164 mg/dL — ABNORMAL HIGH (ref 70–99)
Glucose-Capillary: 166 mg/dL — ABNORMAL HIGH (ref 70–99)
Glucose-Capillary: 166 mg/dL — ABNORMAL HIGH (ref 70–99)
Glucose-Capillary: 167 mg/dL — ABNORMAL HIGH (ref 70–99)

## 2021-02-06 LAB — AMMONIA: Ammonia: 60 umol/L — ABNORMAL HIGH (ref 9–35)

## 2021-02-06 LAB — MAGNESIUM: Magnesium: 1.9 mg/dL (ref 1.7–2.4)

## 2021-02-06 LAB — PHOSPHORUS: Phosphorus: 2.3 mg/dL — ABNORMAL LOW (ref 2.5–4.6)

## 2021-02-06 MED ORDER — POTASSIUM PHOSPHATES 15 MMOLE/5ML IV SOLN
15.0000 mmol | Freq: Once | INTRAVENOUS | Status: AC
  Administered 2021-02-06: 15 mmol via INTRAVENOUS
  Filled 2021-02-06: qty 5

## 2021-02-06 MED ORDER — MIDODRINE HCL 5 MG PO TABS
5.0000 mg | ORAL_TABLET | Freq: Three times a day (TID) | ORAL | Status: DC
Start: 2021-02-06 — End: 2021-02-13
  Administered 2021-02-06 – 2021-02-12 (×20): 5 mg
  Filled 2021-02-06 (×19): qty 1

## 2021-02-06 MED ORDER — ACETAZOLAMIDE 250 MG PO TABS
500.0000 mg | ORAL_TABLET | Freq: Once | ORAL | Status: AC
Start: 2021-02-06 — End: 2021-02-06
  Administered 2021-02-06: 500 mg
  Filled 2021-02-06: qty 2

## 2021-02-06 MED ORDER — FLUCONAZOLE 40 MG/ML PO SUSR
150.0000 mg | Freq: Once | ORAL | Status: AC
Start: 2021-02-06 — End: 2021-02-06
  Administered 2021-02-06: 152 mg
  Filled 2021-02-06: qty 3.8

## 2021-02-06 MED ORDER — METOLAZONE 2.5 MG PO TABS
2.5000 mg | ORAL_TABLET | Freq: Once | ORAL | Status: AC
  Administered 2021-02-06: 2.5 mg
  Filled 2021-02-06: qty 1

## 2021-02-06 NOTE — Progress Notes (Signed)
NAME:  [name] Carrillo, MRN:  [id number], DOB:  10-Dec-1965, LOS: 3 ADMISSION DATE:  01/20/2021, CONSULTATION DATE:  01/24/21 REFERRING MD:  Priscella Mann, CHIEF COMPLAINT:  unresponsive   History of Present Illness:  55 yo F wthi hx of MDD, Anxiety disorder, osteomyelitis of left tibia, left tibial fracture history, recurrent UTIs, EtOH abuse and liver Cirrhosis ([place]), active alcoholism, tobacco abuse with multiple readmissions to hospital with EtOH withdrawal syndrome and now being brought into MICU due to unstable vital signs and aggitated hyperactive delerium with concerns for delerium tremens. Blood work is notable for transaminitis with decreased synthetic function.     Patient had ziplock bag with two teeth in there due to loose teeth in mouth. Husband shares that patient has few false teeth that she had created herself.    Ive met with husband and son and explained that patient is unresponsive with GCS7 and is not able to protect airway. They understand the severity of situation and agree for intubation if needed. We discussed goals of care and patient is full code.   Pertinent  Medical History  MDD Anxiety Alcohol use disorder Alcoholic cirrhosis Smoking  Significant Hospital Events: Including procedures, antibiotic start and stop dates in addition to other pertinent events   10/19 Pt admitted to the [facility] unit with delirium tremors secondary to ETOH withdrawal  10/19: CT Head negative  10/19: CT Abd/Pelvis revealed Hepatomegaly with heterogeneous liver enhancement and evidence of portal venous hypertension including recanalized paraumbilical vein, small spleno-renal varices, and small volume ascites. Chronic liver disease strongly suspected although the enhancement pattern might indicate a superimposed acute hepatitis. No evidence of bile duct obstruction. No discrete liver lesion. No obstructive uropathy but absent renal contrast excretion on the delayed images suggesting acute renal  insufficiency. Punctate nephrolithiasis. Borderline to mild cardiomegaly. Bilateral lower lobe atelectasis. Mild aortic atherosclerosis.  10/24 Transfer to ICU, intubation, CVL placed 10/25: Echo revealed EF 60 to 65% 10/28: Pt with hepatic encephalopathy currently undergoing SBT 10/5.  14L positive will administer 40 mg iv lasix x1 dose  10/28: CT Head motion limited exam. Allowing for this, no acute intracranial abnormality. 10/28: CT Maxillofacial revealed prominent dental caries within the residual right maxillary molar. No associated inflammation. No other focal dental or periodontal disease to suggest infection. Minimal inflammatory changes about the lateral right orbit. No abscess. 10/31: CTA Head revealed no acute intracranial abnormality. A 6 mm left paraclinoid ICA aneurysm. Consult to neuro intervention suggested. No intracranial large vessel occlusion or hemodynamically significant stenosis. Intracranial stenosis described on prior MR angiogram are likely artifactual. Patent major neck arteries without hemodynamically significant stenosis. 10/31: MRA Head/Neck revealed medially directed 6 mm aneurysm from the distal left cavernous carotid/ophthalmic segment. Poor visualization of the right cavernous carotid, which may indicate slow flow or stenosis. Consider CTA for further evaluation. Apparent narrowing of the distal basilar artery, which may be artifactual or indicate focal stenosis. No hemodynamically significant stenosis in the neck. No large vessel occlusion. 10/31: EEG suggestive of moderate to severe diffuse encephalopathy, nonspecific etiology. No seizures or epileptiform discharges were seen throughout the recording.  11/1: Pt remains severely encephalopathic despite sedation remaining off since 10/31 _0 :22 am  11/2: Remains encephalopathic off sedation.  ENT consulted for [facility], family wishes to proceed next week. 11/3: No change in mental status, remains encephalopathic.  Hypernatremia unchanged (150), increase Free Water flushes to 250 cc q4h, hold off on diuresis.  S/p paracentesis with removal of 1.5L of fluid  11/5: Opens eyes  to voice but not following commands, plan to diurese today  Interim History / Subjective:  -No significant events reported overnight -Remains off sedation, pt slightly more arouseable today, will open eyes to voice, but not follow commands -Afebrile, hemodynamically stable -Leukocytosis virtually unchanged, culture from Paracentesis on 11/3 with no growth to date -Na+ improved to 143 -Renal function normal, UOP 800 cc last 24 hrs, (net + 14L since admit) -Plan to diurese today with Diamox + Metolazone  Objective   Blood pressure (!) 143/60, pulse 95, temperature 99.1 F (37.3 C), resp. rate (!) 21, height _0  (1.651 m), weight 95.3 kg, last menstrual period 09/07/2015, SpO2 96 %.    Vent Mode: PRVC FiO2 (%):  [45 %] 45 % Set Rate:  [20 bmp] 20 bmp Vt Set:  [400 mL] 400 mL PEEP:  [5 cmH20] 5 cmH20 Plateau Pressure:  [18 cmH20] 18 cmH20   Intake/Output Summary (Last 24 hours) at 02/06/2021 0740 Last data filed at 02/06/2021 0600 Gross per 24 hour  Intake 1305.54 ml  Output 1700 ml  Net -394.46 ml    Filed Weights   02/04/21 0500 02/05/21 0500 02/06/21 0451  Weight: 91 kg 92.8 kg 95.3 kg    Examination: General: Acutely ill appearing female, NAD, mechanically intubated on no sedation HENT: Atraumatic, normocephalic, supple, no JVD  Lungs: rhonchi throughout, no wheezing even, non labored, overbreathes the vent Cardiovascular: Regular rhythm, sinus rhythm, no R/G, 2+ radial/1+ distal pulses, 1+ generalized edema  Abdomen: +BS x4, soft, obese, abdominal distension present  Extremities: Normal bulk and tone Neuro: Not following commands, opens eyes to voice, cough/gag reflex present when suctioned , PERRL, icteric sclera  GU: Indwelling foley catheter draining yellow urine    Resolved Hospital Problem list    Thrombocytopenia   Assessment & Plan:   Acute encephalopathy multifactorial secondary acute/subacute ischemic pontine infarct, hyperammonemia and infectious process  Moderate-severe alcohol withdrawal with delirium Sedation needs in setting of mechanical ventilation -Continue to hold all sedation for now to assess neurological status  -Neurology following, appreciate input~per neuro aneurysm on CTA Head/Neck and MRA Head Neck can be followed in the outpatient setting  -Monitor CIWA protocol -Continue thiamine, folate and multivitamins -Continue lactulose 30 g per tube bid and rifaximin  -Trend ammonia   UTI due to E. coli/Klebsiella~completed course of ceftriaxone  Worsening leukocytosis secondary to questionable pneumonia ~ treated -Monitor fever curve -Trend WBC's & Procalcitonin -Follow cultures as above -Zosyn discontinued (10/29 - 11/1) as follow up Tracheal aspirate from 10/28 with rare normal respiratory flora -Paracentesis culture from 11/3 currently with no growth to date  Acute hypoxic respiratory failure in the setting of Hepatic Encephalopathy and suspected Pneumonia -Full vent support, implement lung protective strategies -Plateau pressures less than 30 cm H20 -Wean FiO2 & PEEP as tolerated to maintain O2 sats >92% -Follow intermittent Chest X-ray & ABG as needed -Spontaneous Breathing Trials when respiratory parameters met and mental status permits -Mental Status is currently Precluding weaning -Will need TRACH ~ ENT consulted, family wishes to proceed with [facility] next week -Implement VAP Bundle -Prn Bronchodilators -Diuresis as BP and renal function permits  Acute alcoholic hepatitis Ascites  -Monitor hepatic function panel  -Pt s/p paracentesis with removal of 1.5L~11/3  Hypernatremia ~ resolved -Monitor I&O's / urinary output -Follow BMP -Ensure adequate renal perfusion -Avoid nephrotoxic agents as able -Replace electrolytes as indicated -Pharmacy  following for assistance with electrolyte replacement -Continue free water flushes 200 cc q4h, continue D5 _1  ml/hr  Severe  protein calorie malnutrition Dietitian consulted appreciate input~continue tube feeds  Macrocytic anemia due to folate deficiency Likely driven by liver disease/alcoholism -Monitor for S/Sx of bleeding -Trend CBC -Heparin SQ for VTE Prophylaxis  -Transfuse for Hgb <7 -Continue folic acid supplementation  Best Practice (right click and "Reselect all SmartList Selections" daily)   Diet/type: tubefeeds DVT prophylaxis: SCD; subq heparin  GI prophylaxis: PPI Lines: N/A Foley:  Yes and still indicated  Code Status:  full code Last date of multidisciplinary goals of care discussion [02/06/2021]  Critical care time: 38 minutes     Darel Hong, AGACNP-BC [facility] Prefer epic messenger for cross cover needs If after hours, please call [email or web address]-link

## 2021-02-06 NOTE — Progress Notes (Signed)
Updated pt's husband at bedside.   Pt is slightly more alert today (opens eyes to voice and tracks), will intermittently follow commands according to nursing.  Pt is still too lethargic for extubation.  Will continue to follow neuro status daily.  Tentative plan for trach next week in the event mental status has not approved enough for extubation.  All questions answered.  He is appreciative of update.       Harlon Ditty, AGACNP-BC [place] [facility] Prefer epic messenger for cross cover needs If after hours, please call [email or web address]-link

## 2021-02-07 ENCOUNTER — Inpatient Hospital Stay: Payer: BC Managed Care – PPO

## 2021-02-07 DIAGNOSIS — G8250 Quadriplegia, unspecified: Secondary | ICD-10-CM

## 2021-02-07 DIAGNOSIS — G9341 Metabolic encephalopathy: Secondary | ICD-10-CM | POA: Diagnosis not present

## 2021-02-07 LAB — CBC WITH DIFFERENTIAL/PLATELET
Abs Immature Granulocytes: 0.33 10*3/uL — ABNORMAL HIGH (ref 0.00–0.07)
Basophils Absolute: 0.1 10*3/uL (ref 0.0–0.1)
Basophils Relative: 0 %
Eosinophils Absolute: 0.6 10*3/uL — ABNORMAL HIGH (ref 0.0–0.5)
Eosinophils Relative: 2 %
HCT: 25.5 % — ABNORMAL LOW (ref 36.0–46.0)
Hemoglobin: 7.8 g/dL — ABNORMAL LOW (ref 12.0–15.0)
Immature Granulocytes: 1 %
Lymphocytes Relative: 7 %
Lymphs Abs: 1.8 10*3/uL (ref 0.7–4.0)
MCH: 38 pg — ABNORMAL HIGH (ref 26.0–34.0)
MCHC: 30.6 g/dL (ref 30.0–36.0)
MCV: 124.4 fL — ABNORMAL HIGH (ref 80.0–100.0)
Monocytes Absolute: 1.6 10*3/uL — ABNORMAL HIGH (ref 0.1–1.0)
Monocytes Relative: 6 %
Neutro Abs: 21 10*3/uL — ABNORMAL HIGH (ref 1.7–7.7)
Neutrophils Relative %: 84 %
Platelets: 244 10*3/uL (ref 150–400)
RBC: 2.05 MIL/uL — ABNORMAL LOW (ref 3.87–5.11)
RDW: 27.4 % — ABNORMAL HIGH (ref 11.5–15.5)
Smear Review: UNDETERMINED
WBC: 25.4 10*3/uL — ABNORMAL HIGH (ref 4.0–10.5)
nRBC: 0.1 % (ref 0.0–0.2)

## 2021-02-07 LAB — COMPREHENSIVE METABOLIC PANEL
ALT: 87 U/L — ABNORMAL HIGH (ref 0–44)
AST: 173 U/L — ABNORMAL HIGH (ref 15–41)
Albumin: 2.2 g/dL — ABNORMAL LOW (ref 3.5–5.0)
Alkaline Phosphatase: 135 U/L — ABNORMAL HIGH (ref 38–126)
Anion gap: 6 (ref 5–15)
BUN: 33 mg/dL — ABNORMAL HIGH (ref 6–20)
CO2: 33 mmol/L — ABNORMAL HIGH (ref 22–32)
Calcium: 8.4 mg/dL — ABNORMAL LOW (ref 8.9–10.3)
Chloride: 104 mmol/L (ref 98–111)
Creatinine, Ser: 0.48 mg/dL (ref 0.44–1.00)
GFR, Estimated: >60 mL/min (ref >60)
Glucose, Bld: 205 mg/dL — ABNORMAL HIGH (ref 70–99)
Potassium: 3.2 mmol/L — ABNORMAL LOW (ref 3.5–5.1)
Sodium: 143 mmol/L (ref 135–145)
Total Bilirubin: 3.4 mg/dL — ABNORMAL HIGH (ref 0.3–1.2)
Total Protein: 6.7 g/dL (ref 6.5–8.1)

## 2021-02-07 LAB — GLUCOSE, CAPILLARY
Glucose-Capillary: 152 mg/dL — ABNORMAL HIGH (ref 70–99)
Glucose-Capillary: 160 mg/dL — ABNORMAL HIGH (ref 70–99)
Glucose-Capillary: 185 mg/dL — ABNORMAL HIGH (ref 70–99)
Glucose-Capillary: 186 mg/dL — ABNORMAL HIGH (ref 70–99)
Glucose-Capillary: 188 mg/dL — ABNORMAL HIGH (ref 70–99)
Glucose-Capillary: 191 mg/dL — ABNORMAL HIGH (ref 70–99)

## 2021-02-07 LAB — PROTEIN, BODY FLUID (OTHER): Total Protein, Body Fluid Other: 1 g/dL

## 2021-02-07 LAB — PROTIME-INR
INR: 1.2 (ref 0.8–1.2)
Prothrombin Time: 15.6 seconds — ABNORMAL HIGH (ref 11.4–15.2)

## 2021-02-07 LAB — PHOSPHORUS: Phosphorus: 4.1 mg/dL (ref 2.5–4.6)

## 2021-02-07 LAB — MAGNESIUM: Magnesium: 2 mg/dL (ref 1.7–2.4)

## 2021-02-07 IMAGING — MR MR CERVICAL SPINE W/O CM
5 series · 39 of 48 positions shown · non-contrast
Comparison: CTA head and neck [DATE]

CLINICAL DATA: Myelopathy, acute or progressive.

EXAM:
MRI CERVICAL SPINE WITHOUT CONTRAST
TECHNIQUE: Multiplanar, multisequence MR imaging of the cervical spine was
performed. No intravenous contrast was administered.

[Series 5: T2 · sagittal · 3.0mm · 0.62mm/px · 6 of 15 slices shown (1 of 2)]
[im 1/15]
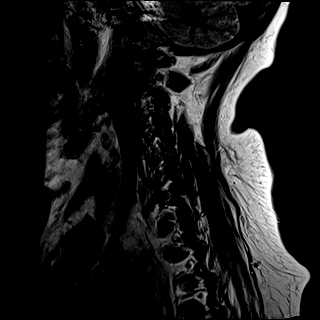
[im 3/15]
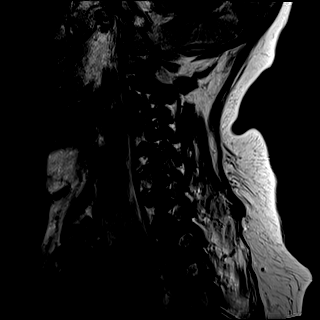
[im 6/15]
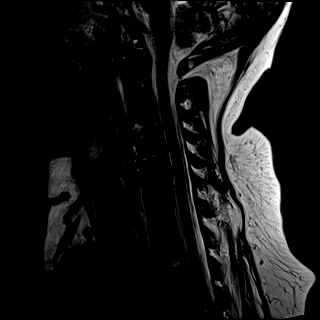
[im 9/15]
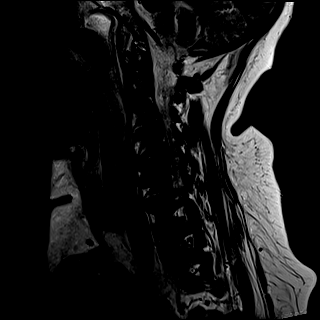
[im 12/15]
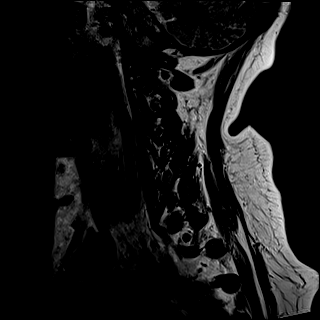
[im 15/15]
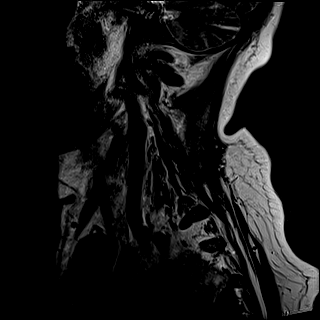

[Series 6: FLAIR · sagittal · 3.0mm · 0.78mm/px · 7 of 15 slices shown]
[im 1/15]
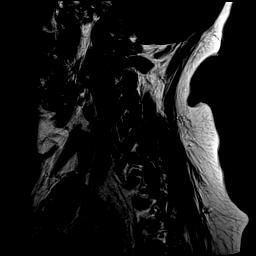
[im 3/15]
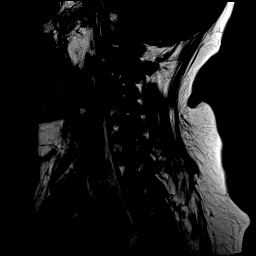
[im 5/15]
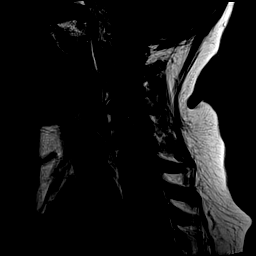
[im 8/15]
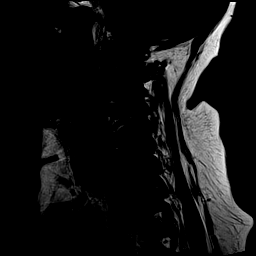
[im 10/15]
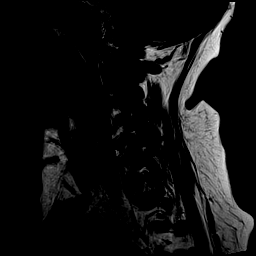
[im 12/15]
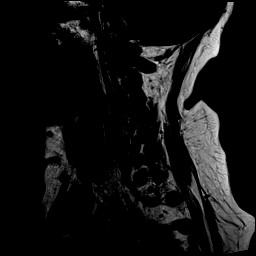
[im 15/15]
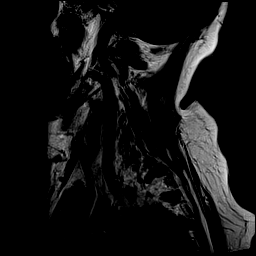

[Series 7: STIR · sagittal · 3.0mm · 0.62mm/px · 7 of 15 slices shown]
[im 1/15]
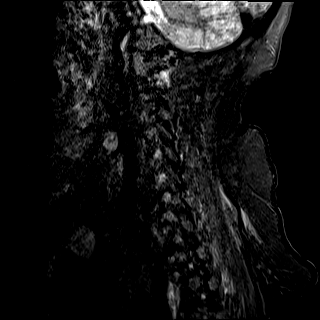
[im 3/15]
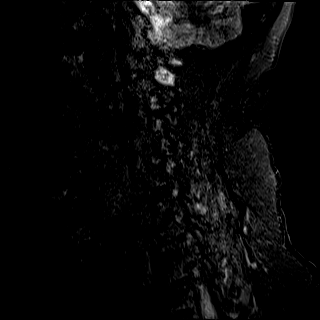
[im 5/15]
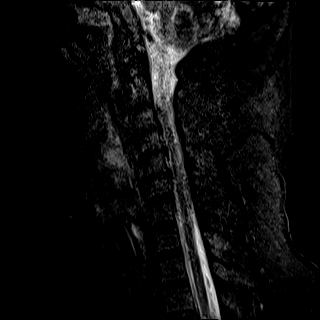
[im 8/15]
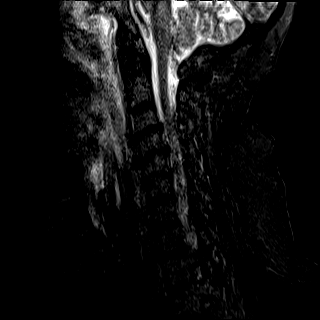
[im 10/15]
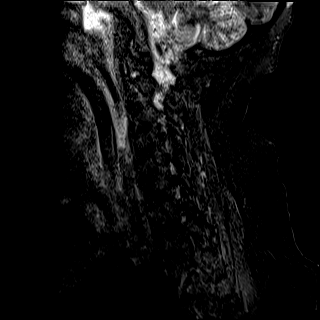
[im 12/15]
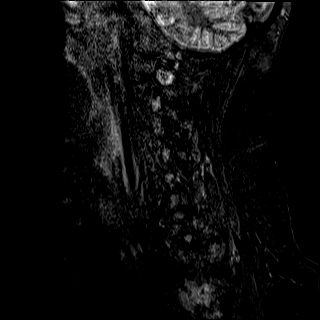
[im 15/15]
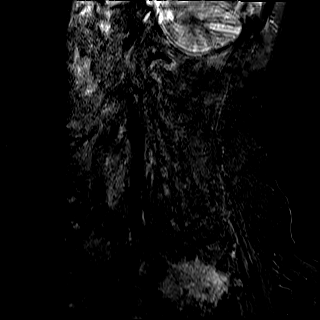

[Series 8: T2 · axial · 3.0mm · 0.70mm/px · z∈[-142,-45]mm · 11 of 29 slices shown (2 of 2)]
[im 1/29]
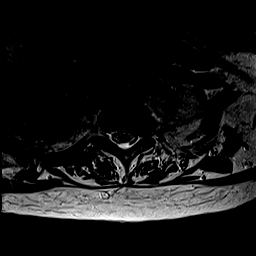
[im 3/29]
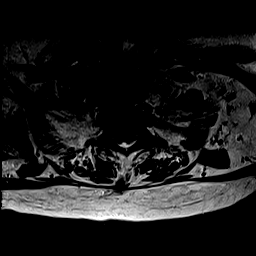
[im 5/29]
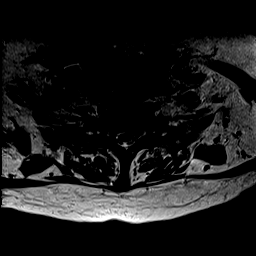
[im 7/29]
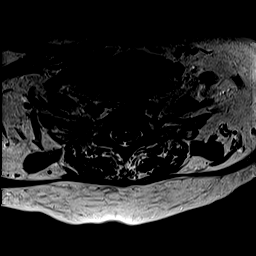
[im 9/29]
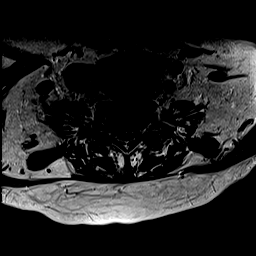
[im 11/29]
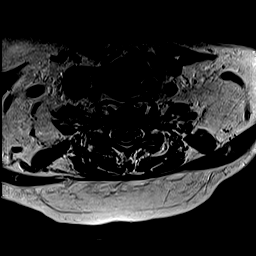
[im 13/29]
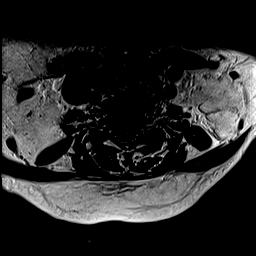
[im 16/29]
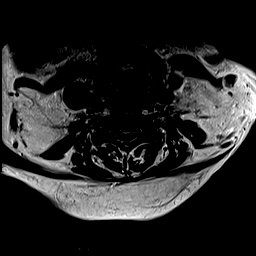
[im 20/29]
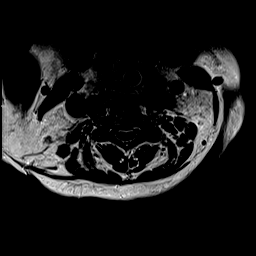
[im 24/29]
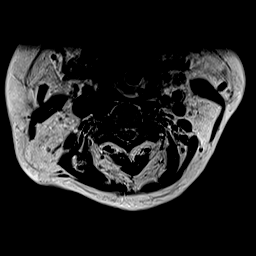
[im 29/29]
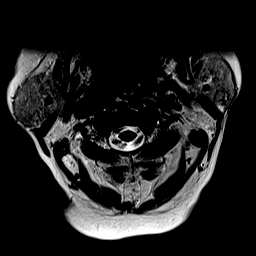

[Series 9: ax mpgr · axial · 3.0mm · 0.35mm/px · z∈[-141,-44]mm · 8 of 29 slices shown]
[im 1/29]
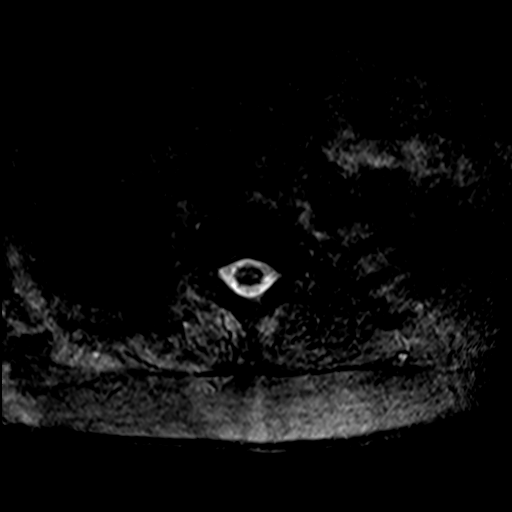
[im 5/29]
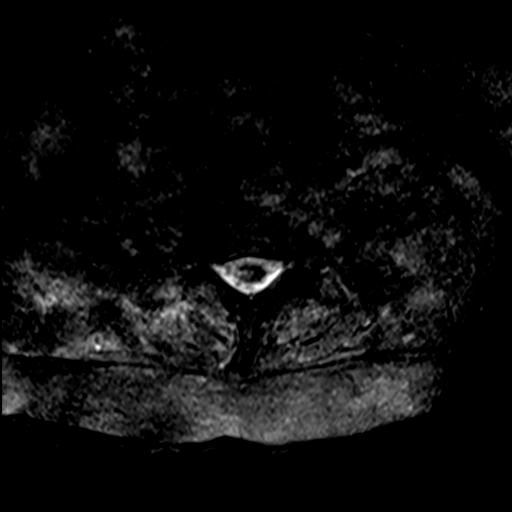
[im 9/29]
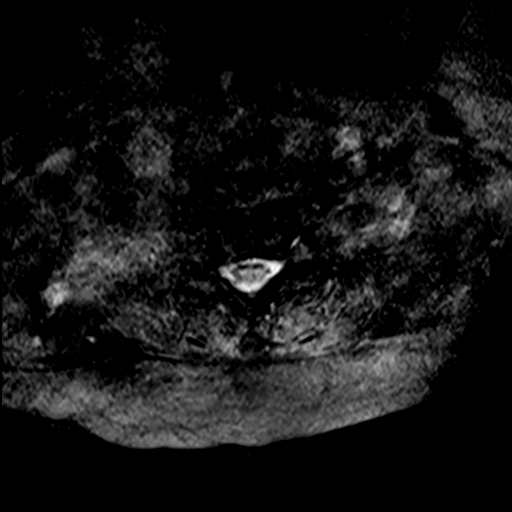
[im 13/29]
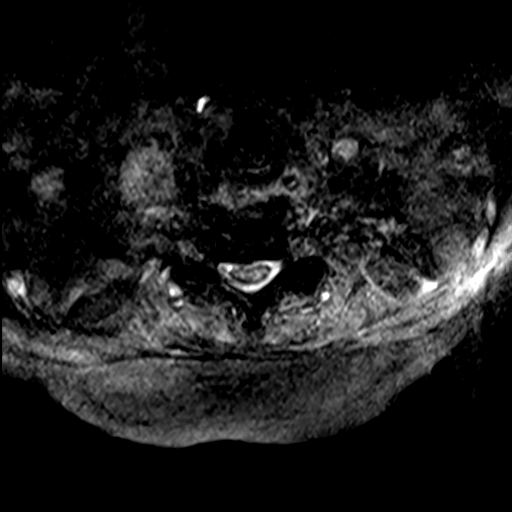
[im 16/29]
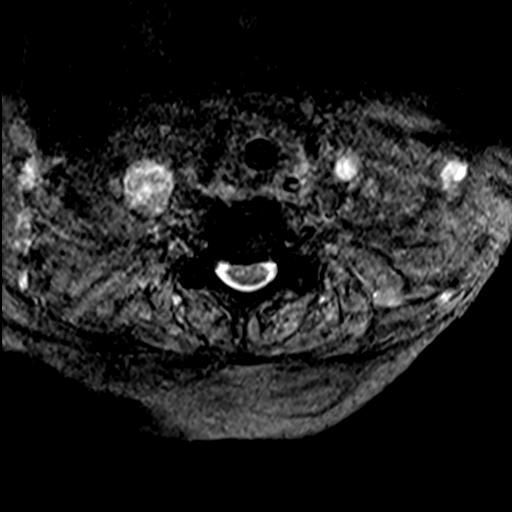
[im 20/29]
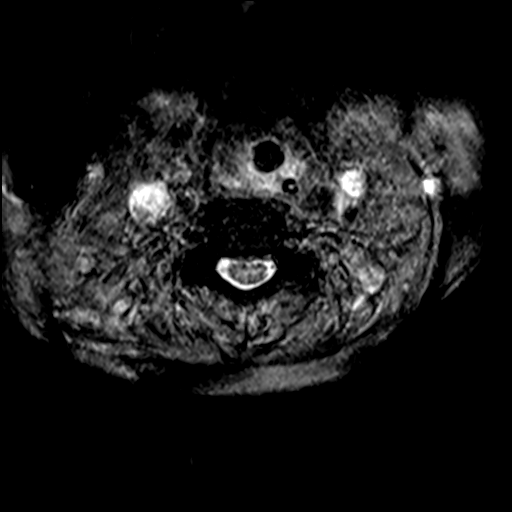
[im 24/29]
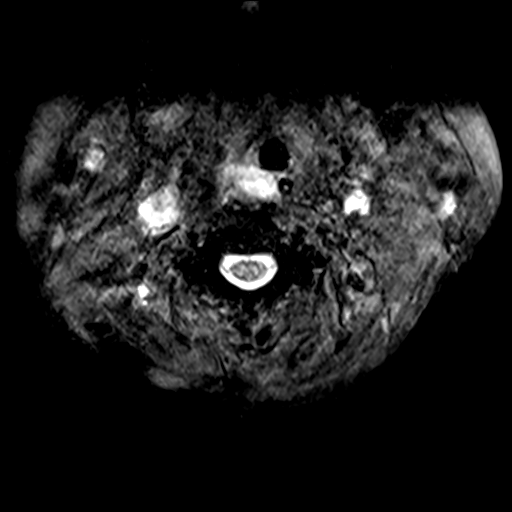
[im 29/29]
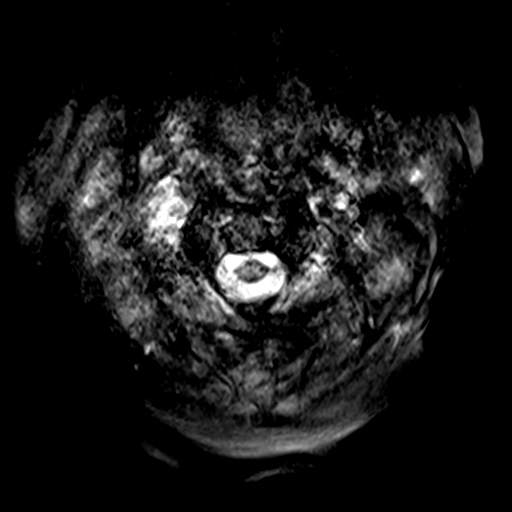

[39 of 48 positions shown; findings below may reference images not displayed]

FINDINGS: The study is moderately motion degraded.

Alignment: Straightening of the normal cervical lordosis. No
listhesis.

Vertebrae: No fracture, suspicious marrow lesion, or evidence of
discitis. Mild multilevel chronic degenerative endplate changes.

Cord: Normal signal and morphology.

Posterior Fossa, vertebral arteries, paraspinal tissues: Preserved
vertebral artery flow voids. Partially visualized endotracheal and
enteric tubes. Partially visualized left apical lung consolidation,
also present on the prior CTA.

Disc levels:

C2-3: Negative.

C3-4: Mild disc space narrowing. Disc bulging and uncovertebral
spurring result in borderline to mild spinal stenosis and mild left
neural foraminal stenosis.

C4-5: Mild disc space narrowing. Disc bulging, uncovertebral
spurring, and mild left facet arthrosis result in mild spinal
stenosis without significant neural foraminal stenosis.

C5-6: Mild disc space narrowing. Disc bulging, uncovertebral
spurring, and mild left facet arthrosis result in mild spinal
stenosis without significant neural foraminal stenosis.

C6-7: Mild disc bulging and uncovertebral spurring without
significant stenosis.

C7-T1: Negative.
IMPRESSION: 1. Motion degraded examination.
2. Multilevel cervical disc degeneration with mild spinal stenosis
from C3-4 to C5-6.
3. Normal appearance of the cervical spinal cord.

## 2021-02-07 IMAGING — DX DG CHEST 1V PORT
1 series · 1 of 1 positions shown · non-contrast
Comparison: [DATE] chest radiograph.

CLINICAL DATA: Acute respiratory failure with hypoxia

EXAM:
PORTABLE CHEST 1 VIEW

[chest ap]
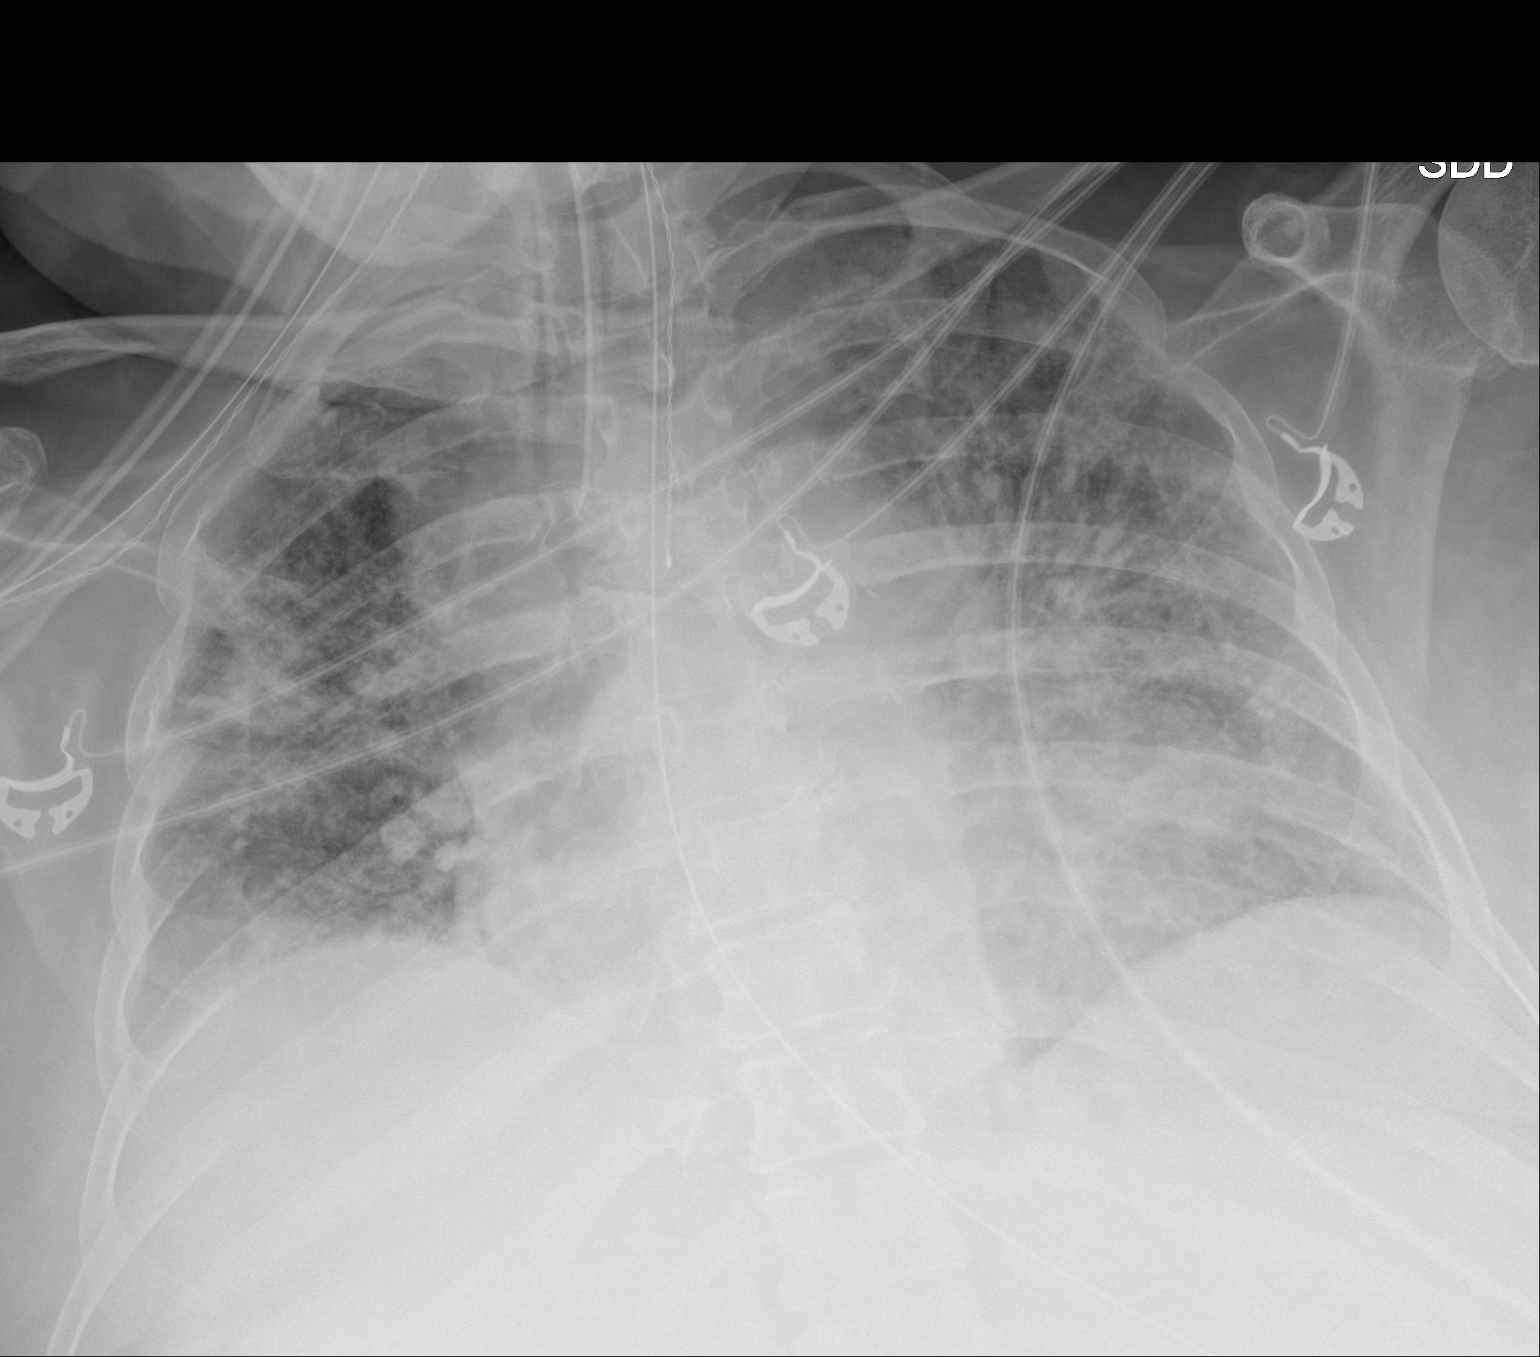

[1 of 1 positions shown; findings below may reference images not displayed]

FINDINGS: Endotracheal tube tip is 2.5 cm above the carina. Enteric tube
enters stomach with the tip not seen on this image. Esophageal
temperature probe terminates in the midthoracic esophagus. Stable
cardiomediastinal silhouette with mild cardiomegaly. No
pneumothorax. No pleural effusion. Patchy hazy opacities in both
lungs with streaky prominent perihilar interstitial markings, not
appreciably changed.
IMPRESSION: 1. Well-positioned support structures.
2. Stable mild cardiomegaly with patchy hazy opacities in both lungs
and streaky prominent perihilar interstitial markings, indeterminate
for pulmonary edema versus pneumonia.

## 2021-02-07 IMAGING — US US ABDOMEN LIMITED
1 series · 14 of 18 positions shown · non-contrast
Comparison: None.

CLINICAL DATA: Abdominal distension.  Cirrhosis.

EXAM:
LIMITED ABDOMEN ULTRASOUND FOR ASCITES
TECHNIQUE: Limited ultrasound survey for ascites was performed in all four
abdominal quadrants.

[Series 1: us abdomen limited · 14 of 18 slices shown]
[im 1/18]
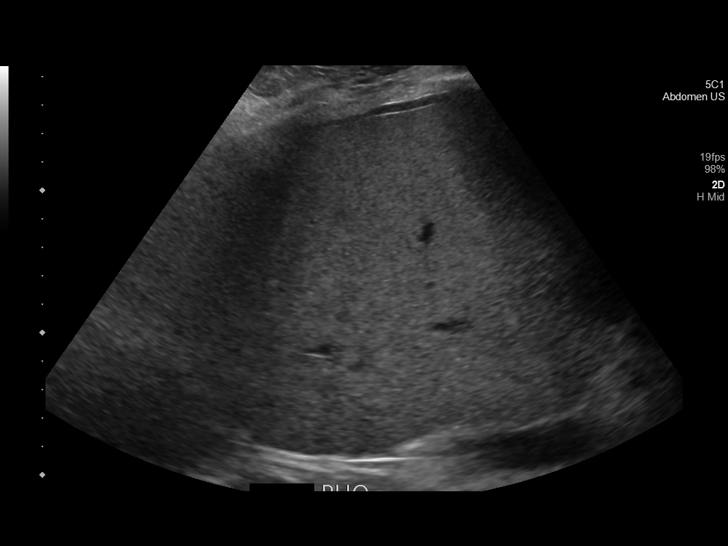
[im 2/18]
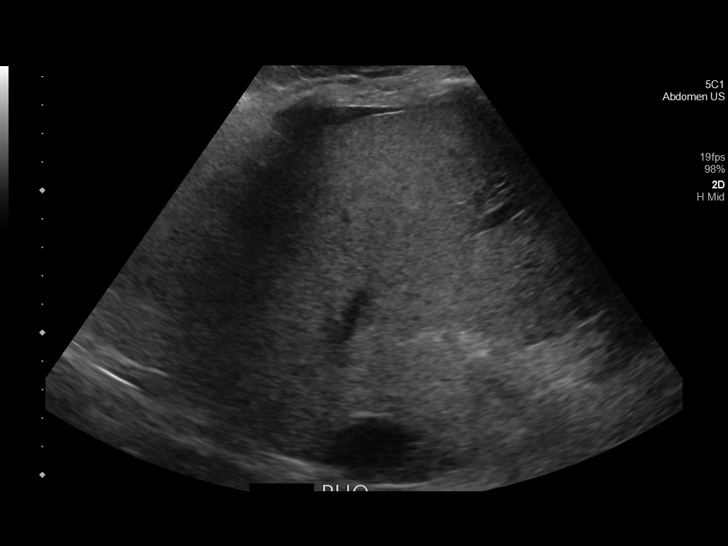
[im 4/18]
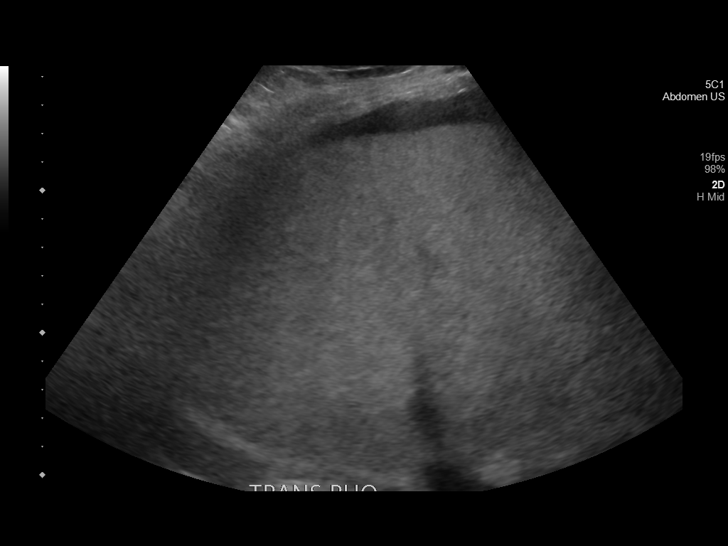
[im 5/18]
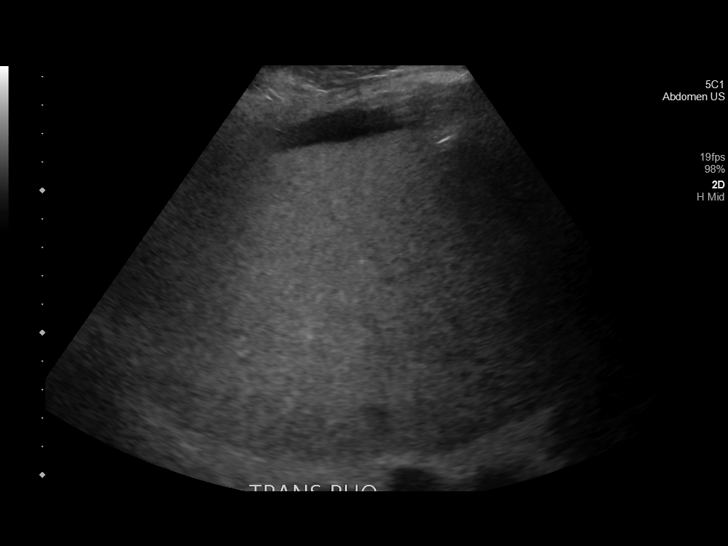
[im 6/18]
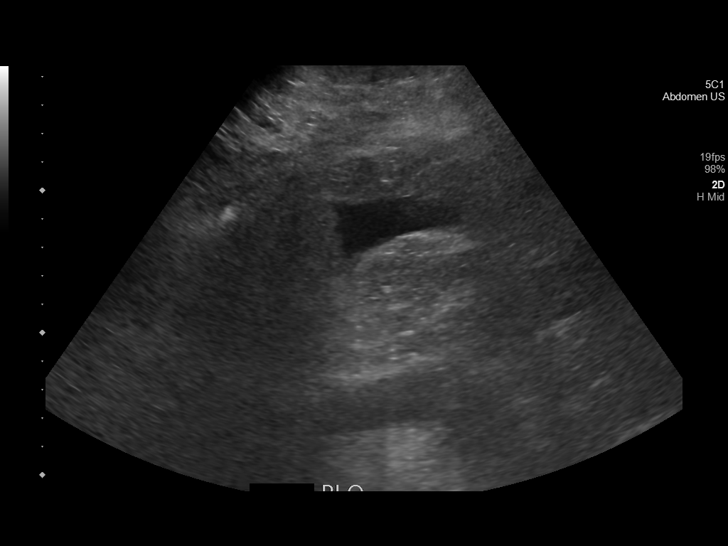
[im 8/18]
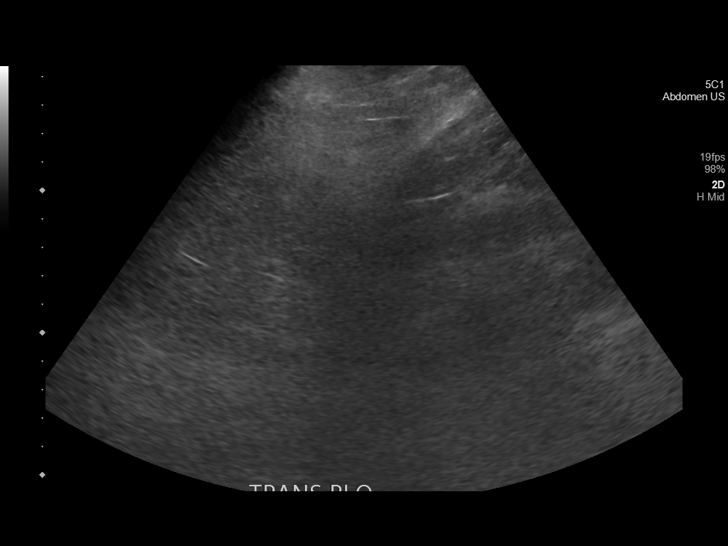
[im 9/18]
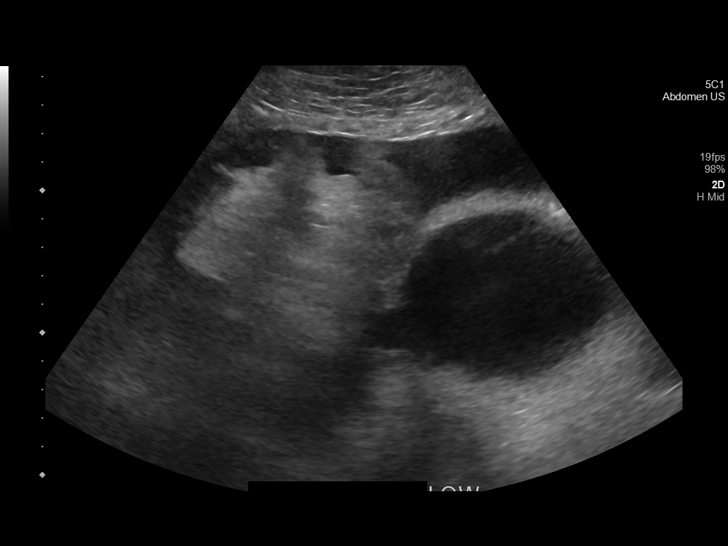
[im 10/18]
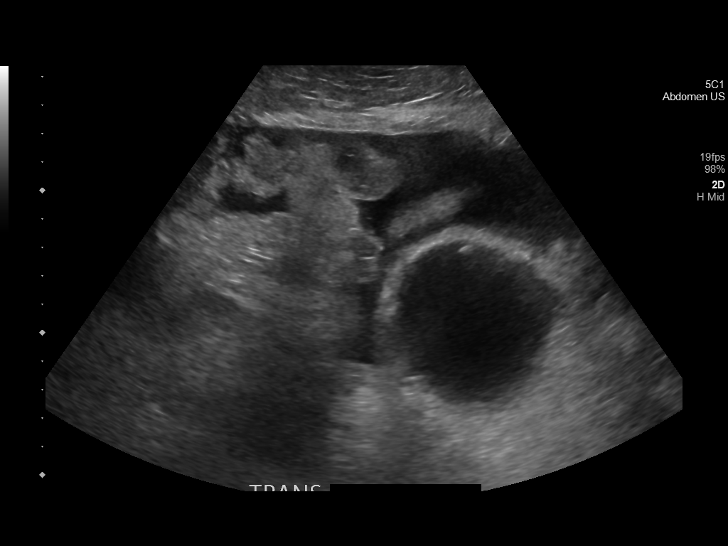
[im 11/18]
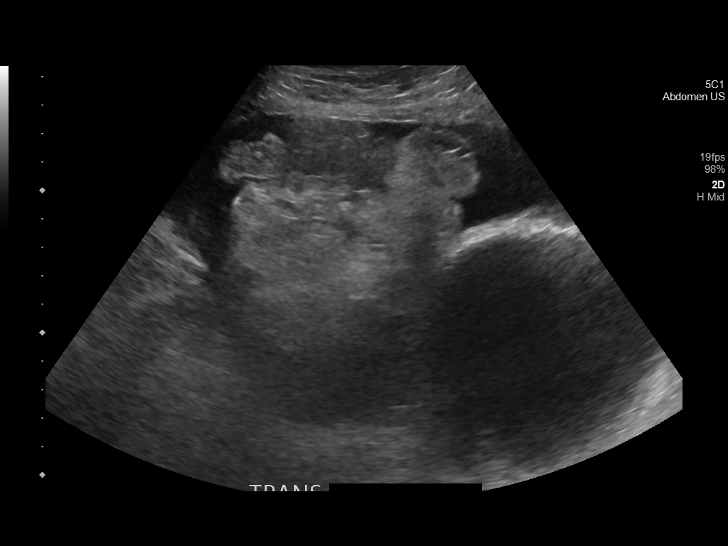
[im 13/18]
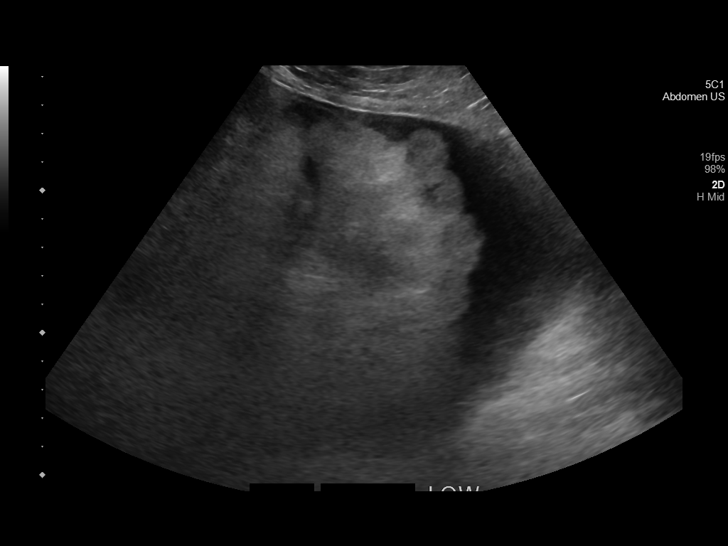
[im 14/18]
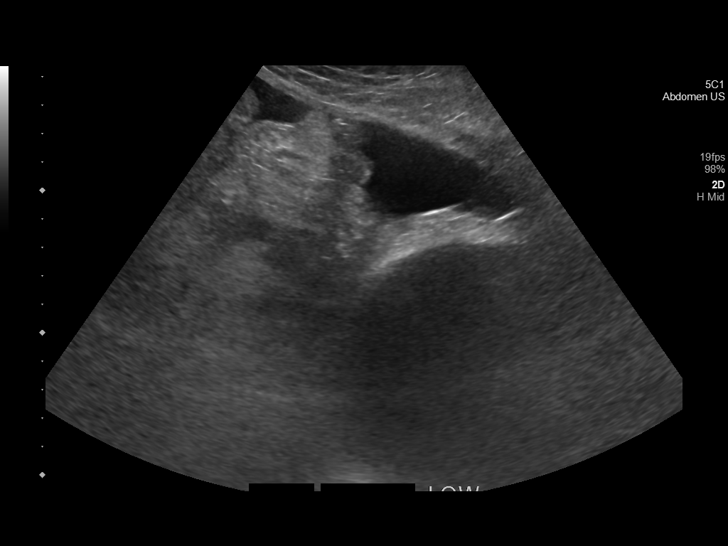
[im 15/18]
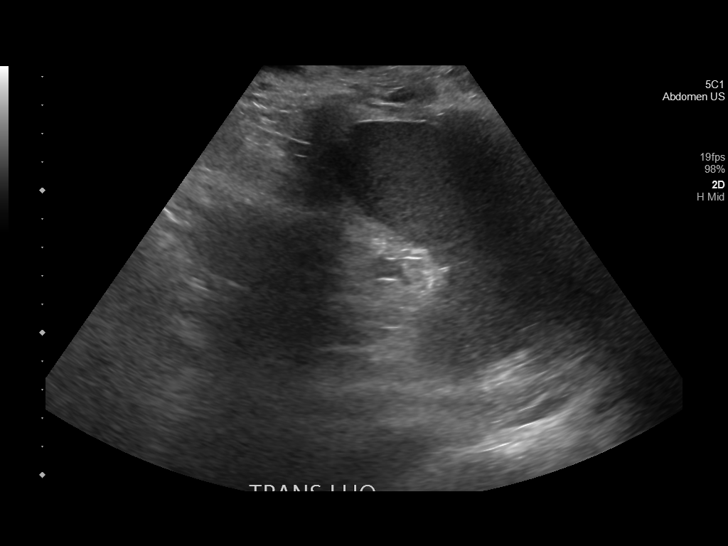
[im 17/18]
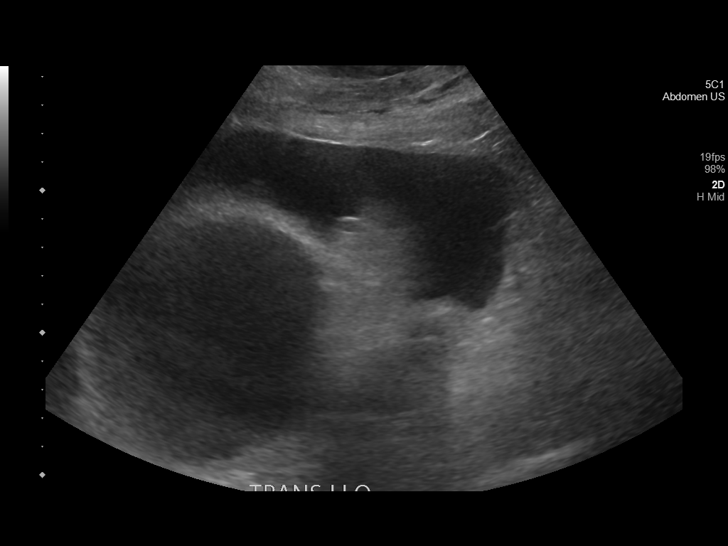
[im 18/18]
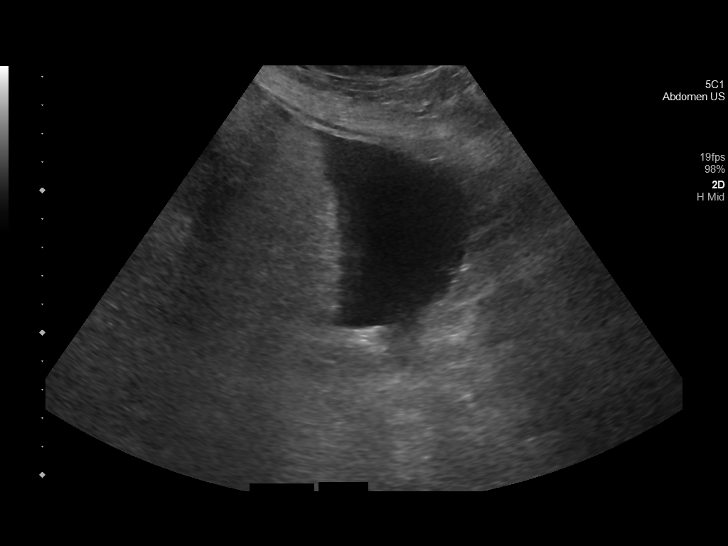

[14 of 18 positions shown; findings below may reference images not displayed]

FINDINGS: A small amount of ascites is seen in all 4 abdominal quadrants.
IMPRESSION: Mild ascites in all 4 abdominal quadrants.

## 2021-02-07 IMAGING — MR MR HEAD W/O CM
14 series · 48 of 48 positions shown · non-contrast
Comparison: Head MRI [DATE]

CLINICAL DATA: Stroke, follow up.

EXAM:
MRI HEAD WITHOUT CONTRAST
TECHNIQUE: Multiplanar, multiecho pulse sequences of the brain and surrounding
structures were obtained without intravenous contrast.

[Series 5: ax dwi_tracew · axial · 3.0mm · 1.80mm/px · z∈[+20,+170]mm · 2 of 48 slices shown]
[im 1/48]
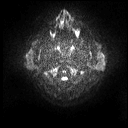
[im 48/48]
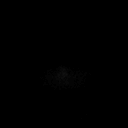

[Series 6: ax dwi_adc · axial · 3.0mm · 1.80mm/px · z∈[+20,+167]mm · 3 of 47 slices shown]
[im 1/47]
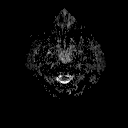
[im 24/47]
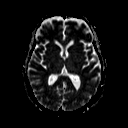
[im 47/47]
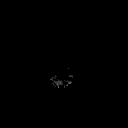

[Series 7: cor dwi_tracew · coronal · 5.0mm · 1.80mm/px · 3 of 38 slices shown]
[im 1/38]
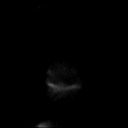
[im 19/38]
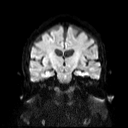
[im 38/38]
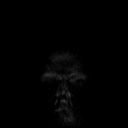

[Series 8: cor dwi_adc · coronal · 5.0mm · 1.80mm/px · 3 of 38 slices shown]
[im 1/38]
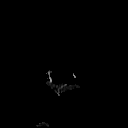
[im 19/38]
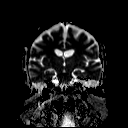
[im 38/38]
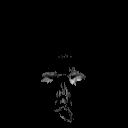

[Series 9: T1 · sagittal · 5.0mm · 0.62mm/px · 2 of 23 slices shown (1 of 2)]
[im 1/23]
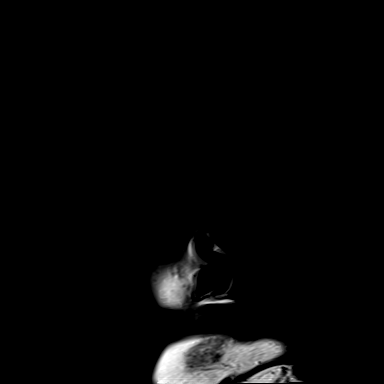
[im 23/23]
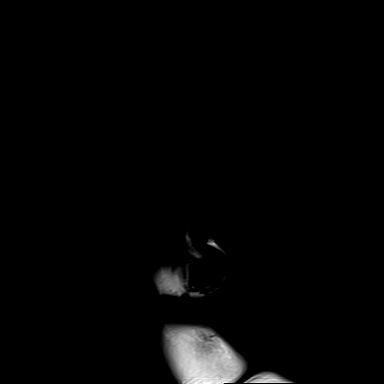

[Series 10: T2 · axial · 5.0mm · 0.86mm/px · z∈[+25,+163]mm · 2 of 25 slices shown (1 of 3)]
[im 1/25]
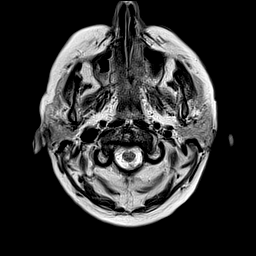
[im 25/25]
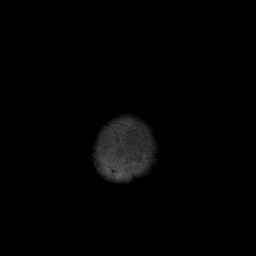

[Series 11: mag_images · axial · 3.0mm · 0.90mm/px · z∈[+28,+174]mm · 4 of 52 slices shown]
[im 1/52]
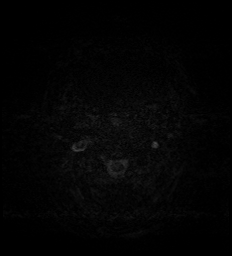
[im 18/52]
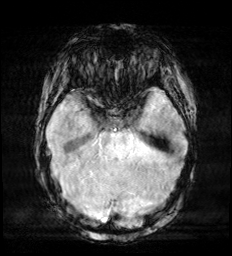
[im 35/52]
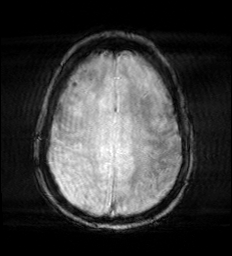
[im 52/52]
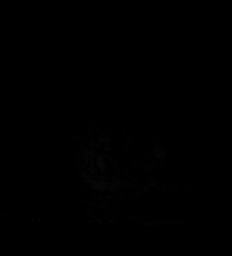

[Series 12: pha_images · axial · 3.0mm · 0.90mm/px · z∈[+31,+169]mm · 3 of 49 slices shown]
[im 1/49]
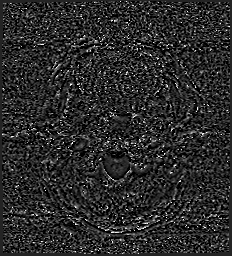
[im 25/49]
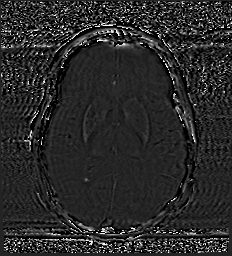
[im 49/49]
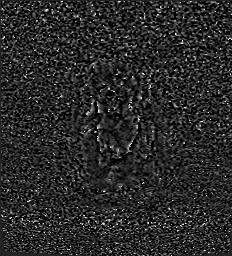

[Series 13: swi_images · axial · 3.0mm · 0.90mm/px · z∈[+28,+174]mm · 4 of 52 slices shown]
[im 1/52]
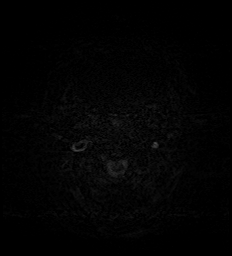
[im 18/52]
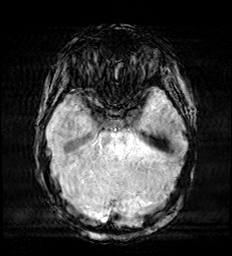
[im 35/52]
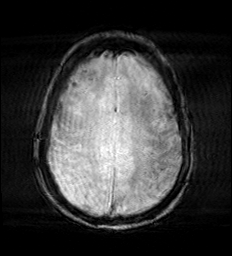
[im 52/52]
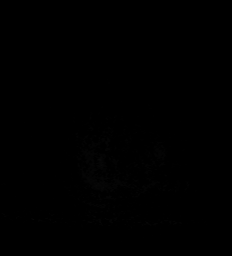

[Series 14: mip_images(sw) · axial · 24.0mm · 0.90mm/px · z∈[+38,+164]mm · 3 of 45 slices shown]
[im 1/45]
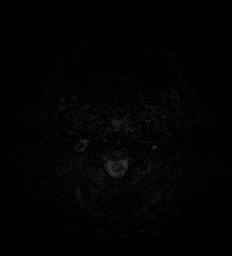
[im 23/45]
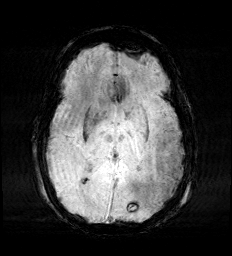
[im 45/45]
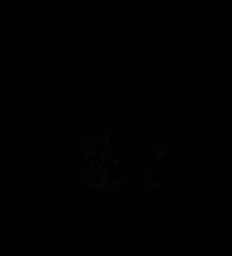

[Series 15: T2 · axial · 5.0mm · 0.86mm/px · z∈[+40,+176]mm · 2 of 25 slices shown (2 of 3)]
[im 1/25]
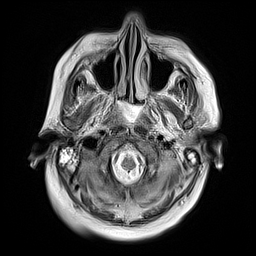
[im 25/25]
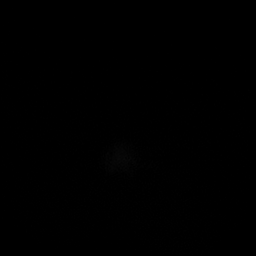

[Series 16: FLAIR · axial · 3.0mm · 0.69mm/px · z∈[+31,+184]mm · 4 of 55 slices shown]
[im 1/55]
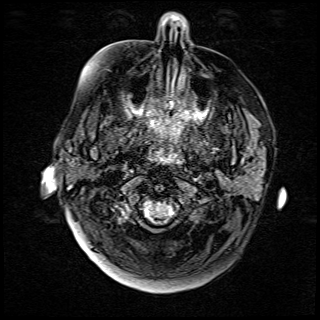
[im 19/55]
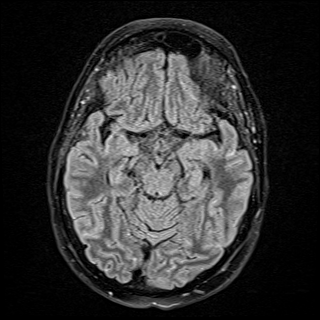
[im 37/55]
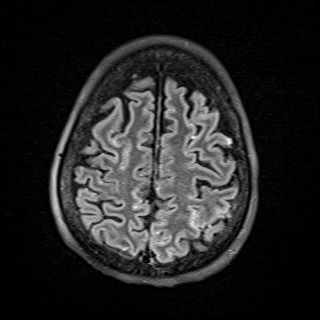
[im 55/55]
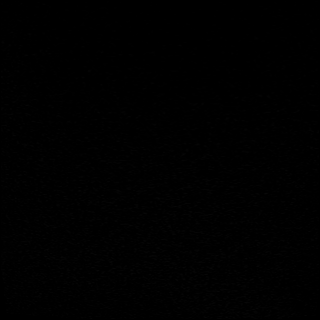

[Series 17: T1 · axial · 1.0mm · 0.98mm/px · z∈[+21,+174]mm · 11 of 160 slices shown (2 of 2)]
[im 1/160]
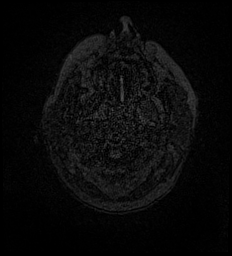
[im 16/160]
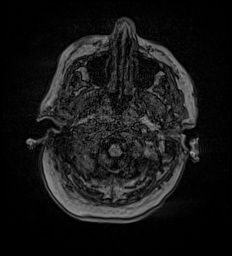
[im 32/160]
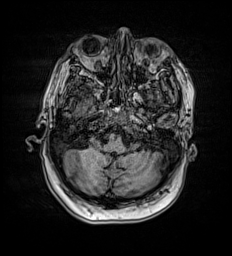
[im 48/160]
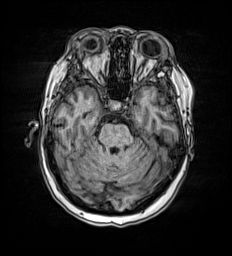
[im 64/160]
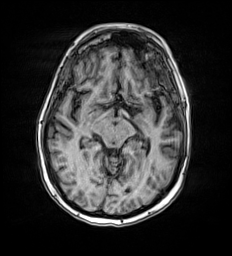
[im 80/160]
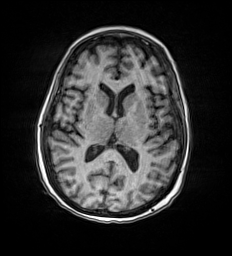
[im 96/160]
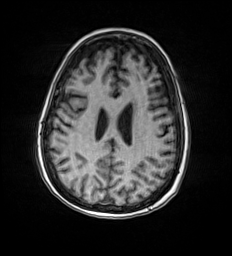
[im 112/160]
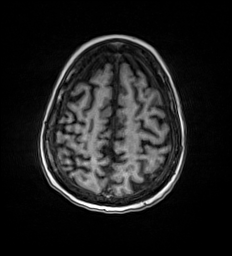
[im 128/160]
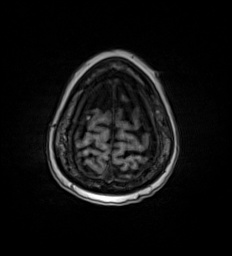
[im 144/160]
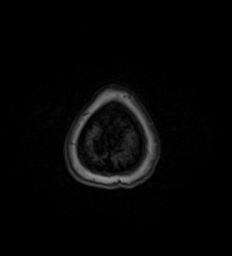
[im 160/160]
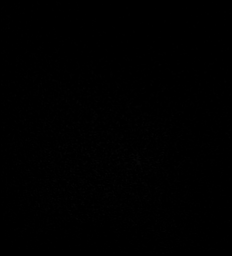

[Series 18: T2 · coronal · 5.0mm · 0.86mm/px · 2 of 29 slices shown (3 of 3)]
[im 1/29]
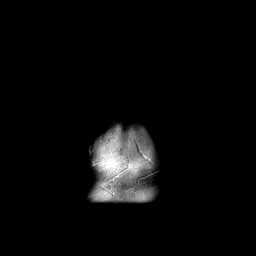
[im 29/29]
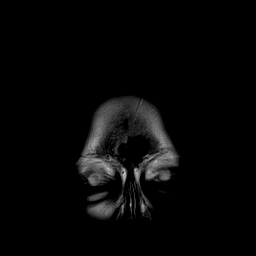

[48 of 48 positions shown; findings below may reference images not displayed]

FINDINGS: Some sequences are severely motion degraded.

Brain: A small pontine infarct is unchanged in size. Restricted
diffusion in the thalami has resolved.

There are multiple new small foci of susceptibility artifact,
diffusion abnormality, and FLAIR hyperintensity over both cerebral
convexities primarily in the frontal and parietal regions which
appear predominantly sulcal in location suggesting subarachnoid
hemorrhage. Some coexistent cortical petechial hemorrhage or mild
cortical edema is also possible. There is a new small amount of
hemorrhage layering in the occipital horns of the lateral
ventricles. No mass, midline shift, or extra-axial fluid collection
is identified. The ventricles are minimally larger than on the prior
MRI without overt hydrocephalus.

Vascular: Major intracranial vascular flow voids are preserved.

Skull and upper cervical spine: Unremarkable bone marrow signal.

Sinuses/Orbits: Unremarkable orbits. Clear paranasal sinuses. Large
bilateral mastoid and middle ear effusions in the setting of
intubation.

Other: None.
IMPRESSION: 1. Motion degraded examination.
2. New scattered small volume subarachnoid hemorrhage over both
cerebral convexities and small amount of intraventricular
hemorrhage.
3. Unchanged size of subacute pontine infarct.
4. Resolved thalamic diffusion abnormality.

These results will be called to the ordering clinician or
representative by the Radiologist Assistant, and communication
documented in the PACS or [REDACTED].

## 2021-02-07 IMAGING — CT CT ANGIO HEAD
4 of 7 series · 17 of 47 positions shown · IV contrast (APPLIED)
Comparison: Head MRI [DATE].  Head and neck CTA [DATE].

CLINICAL DATA: Intracranial hemorrhage.

EXAM:
CT ANGIOGRAPHY HEAD
TECHNIQUE: Multidetector CT imaging of the head was performed using the
standard protocol during bolus administration of intravenous
contrast. Multiplanar CT image reconstructions and MIPs were
obtained to evaluate the vascular anatomy.
CONTRAST:  75mL OMNIPAQUE IOHEXOL 350 MG/ML SOLN

[Series 4: cta head · axial · 0.43mm/px · z∈[-176,-38]mm · 7 of 93 slices shown]
[im 12/93  brain]
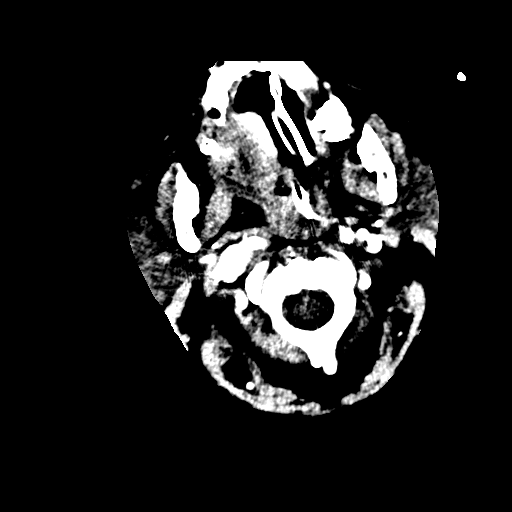
[im 24/93  bone]
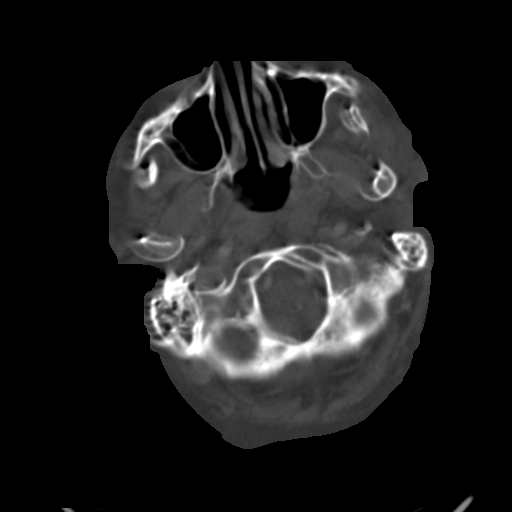
[im 35/93  brain]
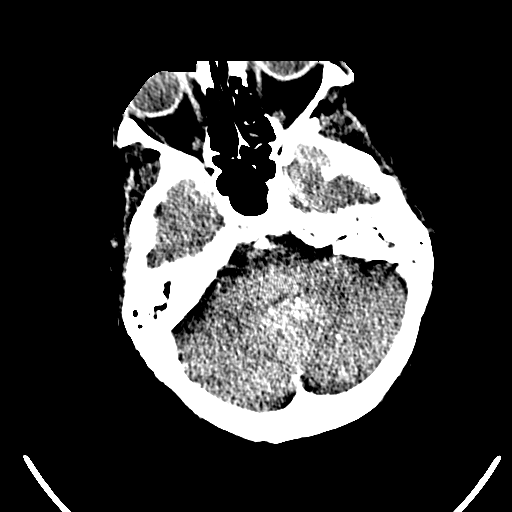
[im 47/93  bone]
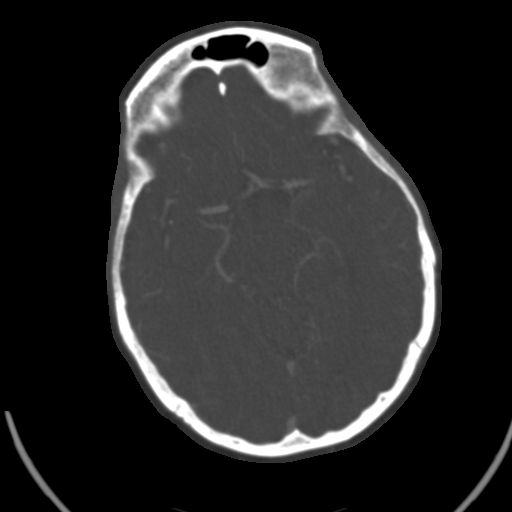
[im 58/93  brain]
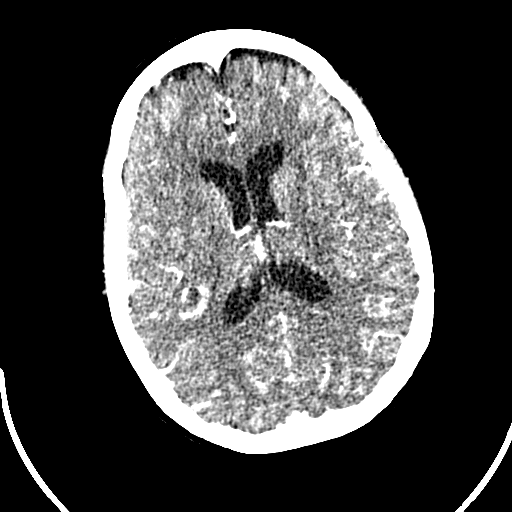
[im 70/93  bone]
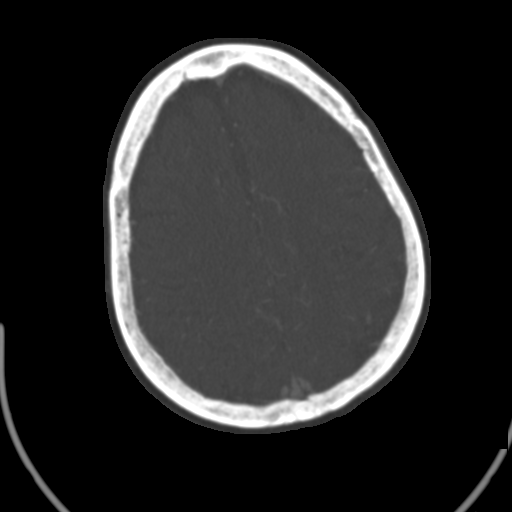
[im 81/93  brain]
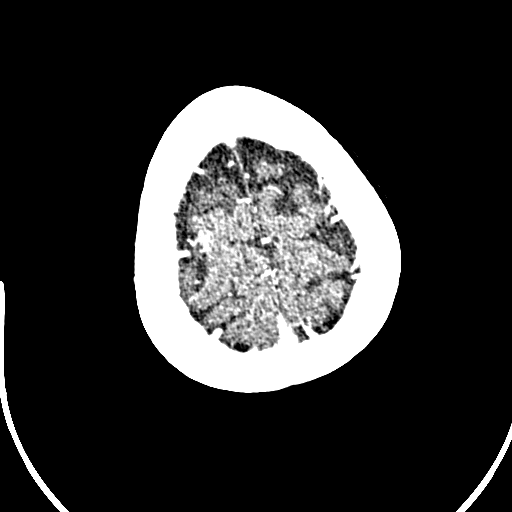

[Series 6: ax thin · axial · 0.35mm/px · z∈[-178,-112]mm · 4 of 175 slices shown]
[im 11/175  brain]
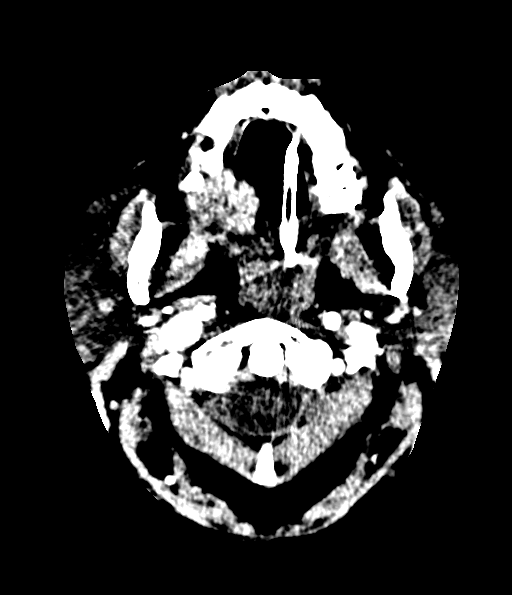
[im 33/175  brain]
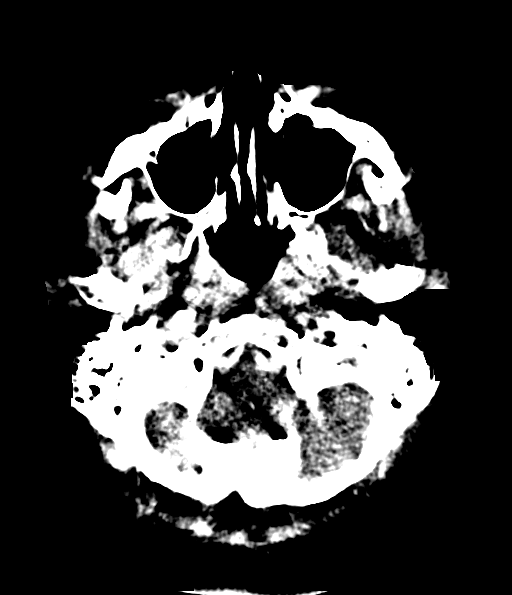
[im 55/175  brain]
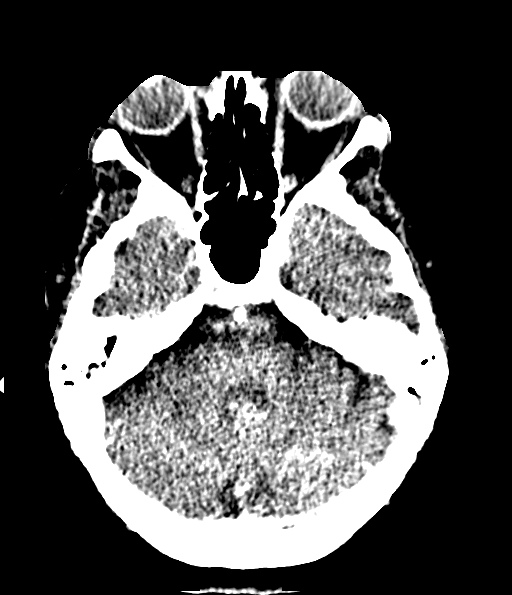
[im 77/175  brain]
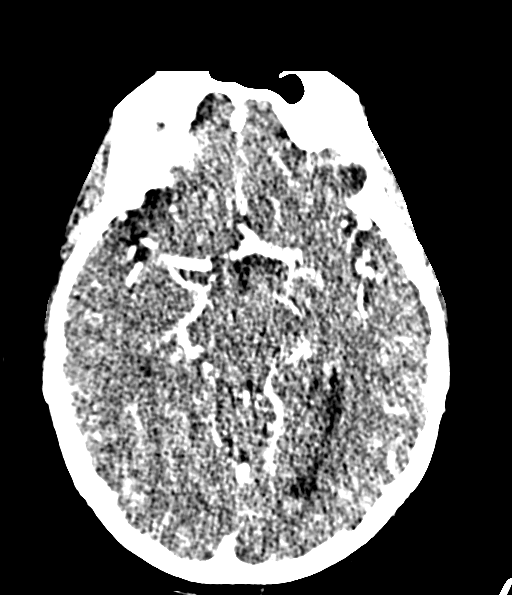

[Series 8: cor thin · coronal · 0.35mm/px · 3 of 207 slices shown]
[im 59/207  brain]
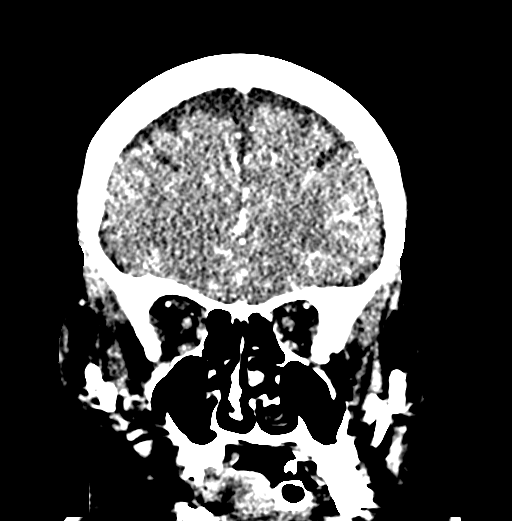
[im 89/207  brain]
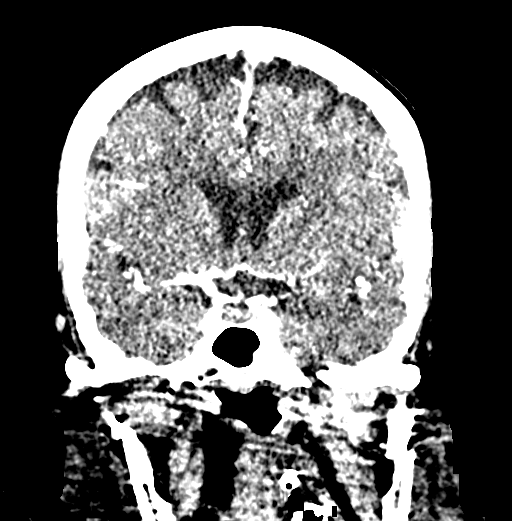
[im 118/207  brain]
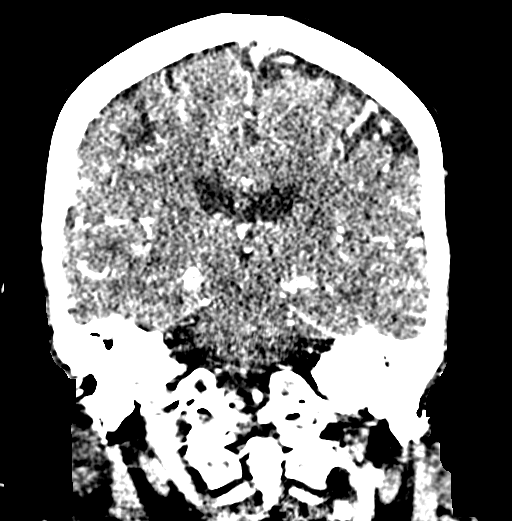

[Series 10: sag thin · sagittal · 0.37mm/px · 3 of 178 slices shown]
[im 36/178  brain]
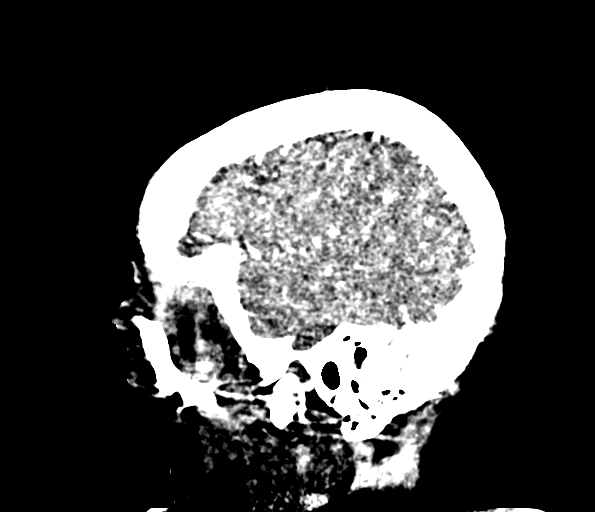
[im 71/178  brain]
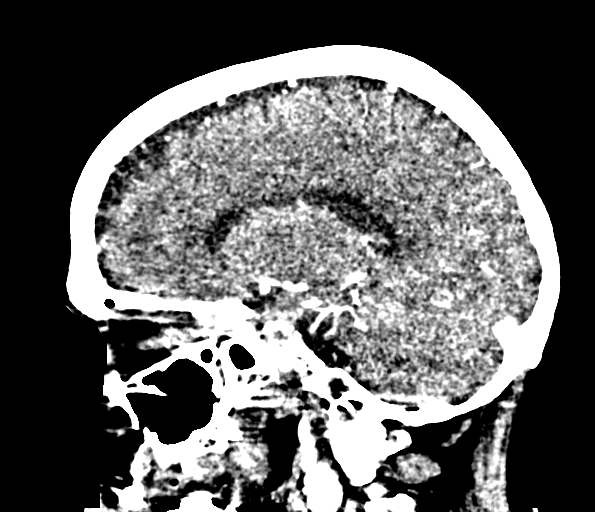
[im 107/178  brain]
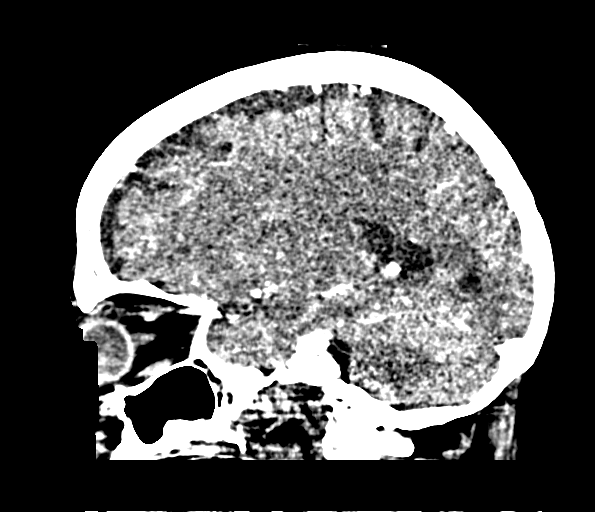

[17 of 47 positions shown; findings below may reference images not displayed]

FINDINGS: Anterior circulation: The intracranial internal carotid arteries are
patent with mild atherosclerotic plaque but no evidence of a
significant stenosis within limitations of motion artifact. A 6 mm
aneurysm projecting medially from the left paraclinoid ICA is
unchanged in size and morphology. ACAs and MCAs are patent without
evidence of a proximal branch occlusion or significant proximal
stenosis. No new aneurysm is identified.

Posterior circulation: The intracranial vertebral arteries are
patent to the basilar with detailed assessment limited by motion
artifact. The basilar artery is widely patent. There are left larger
than right posterior communicating arteries. Both PCAs are patent
without evidence of a significant proximal stenosis. No aneurysm is
identified.

Venous sinuses: As permitted by contrast timing, patent.

Anatomic variants: None.

As seen on today's MRI, there is scattered small volume sulcal
subarachnoid hemorrhage over both cerebral convexities and small
volume hemorrhage in the occipital horns of the lateral ventricles.

Review of the MIP images confirms the above findings.
IMPRESSION: 1. Unchanged 6 mm left paraclinoid ICA aneurysm.
2. No major intracranial arterial occlusion or significant proximal
stenosis.

## 2021-02-07 MED ORDER — IOHEXOL 350 MG/ML SOLN: 75.0000 mL | Freq: Once | INTRAVENOUS | Status: DC | PRN

## 2021-02-07 MED ORDER — IOHEXOL 350 MG/ML SOLN
75.0000 mL | Freq: Once | INTRAVENOUS | Status: AC | PRN
  Administered 2021-02-07: 75 mL via INTRAVENOUS

## 2021-02-07 MED ORDER — POTASSIUM CHLORIDE 10 MEQ/100ML IV SOLN
10.0000 meq | INTRAVENOUS | Status: AC
  Administered 2021-02-07 (×4): 10 meq via INTRAVENOUS
  Filled 2021-02-07 (×5): qty 100

## 2021-02-07 MED ORDER — POTASSIUM CHLORIDE 20 MEQ PO PACK
40.0000 meq | PACK | Freq: Once | ORAL | Status: AC
  Administered 2021-02-07: 40 meq
  Filled 2021-02-07: qty 2

## 2021-02-07 MED ORDER — THIAMINE HCL 100 MG/ML IJ SOLN
100.0000 mg | Freq: Every day | INTRAMUSCULAR | Status: DC
  Administered 2021-02-10 – 2021-02-12 (×3): 100 mg via INTRAVENOUS
  Filled 2021-02-07 (×3): qty 2

## 2021-02-07 MED ORDER — THIAMINE HCL 100 MG/ML IJ SOLN
500.0000 mg | Freq: Three times a day (TID) | INTRAVENOUS | Status: AC
  Administered 2021-02-07 – 2021-02-09 (×9): 500 mg via INTRAVENOUS
  Filled 2021-02-07 (×10): qty 5

## 2021-02-07 NOTE — Progress Notes (Addendum)
Subjective: More awake. Following simple commands.   Objective: Current vital signs: BP (!) 120/59 (BP Location: Right Arm)   Pulse 86   Temp 98.6 F (37 C) (Esophageal)   Resp (!) 23   Ht '5\' 5"'  (1.651 m)   Wt 97.4 kg   LMP 09/07/2015   SpO2 96%   BMI 35.73 kg/m  Vital signs in last 24 hours: Temp:  [97.5 F (36.4 C)-99.9 F (37.7 C)] 98.6 F (37 C) (11/06 0800) Pulse Rate:  [84-104] 86 (11/06 0800) Resp:  [18-31] 23 (11/06 0800) BP: (111-149)/(54-74) 120/59 (11/06 0800) SpO2:  [94 %-98 %] 96 % (11/06 0800) FiO2 (%):  [40 %-45 %] 40 % (11/06 0800) Weight:  [97.4 kg] 97.4 kg (11/06 0421)  Intake/Output from previous day: 11/05 0701 - 11/06 0700 In: 3058.6 [I.V.:129.9; NG/GT:2640; IV Piggyback:258.7] Out: 2015 [Urine:965; Stool:1050] Intake/Output this shift: Total I/O In: 120 [NG/GT:120] Out: 250 [Urine:250] Nutritional status:  Diet Order             Diet NPO time specified  Diet effective now                  HEENT: [place]/AT. Scleral edema with icterus.  Lungs: Intubated Ext: No cyanosis or pallor. Diffuse limb edema.    Neurologic Exam: Ment: Eyes are open and follows simple commands. Nodded head slightly to one question. Mouths "ow" with plantar stimulation.   .  CN: PERRL and sluggish. Eyes are conjugate; will gaze slowly to left and then to right when asked. No nystagmus. Face symmetric.  Motor/Sensory: Flaccid tone in BUE with no movement proximally or distally to noxious or to repeated requests to wiggle fingers. This is noted in the context of diffuse limb edema.  BLE with flaccid tone. Will weakly withdraw with 2/5 strength to plantar stimulation. Will weakly wiggle toes on right and left foot when asked.  Reflexes: Absent brachioradialis, biceps, patellar and achilles reflexes. Toes mute bilaterally    Lab Results: Results for orders placed or performed during the hospital encounter of 01/20/21 (from the past 48 hour(s))  Glucose, capillary      Status: Abnormal   Collection Time: 02/05/21 11:19 AM  Result Value Ref Range   Glucose-Capillary 173 (H) 70 - 99 mg/dL    Comment: Glucose reference range applies only to samples taken after fasting for at least 8 hours.  Glucose, capillary     Status: Abnormal   Collection Time: 02/05/21  4:44 PM  Result Value Ref Range   Glucose-Capillary 177 (H) 70 - 99 mg/dL    Comment: Glucose reference range applies only to samples taken after fasting for at least 8 hours.  Glucose, capillary     Status: Abnormal   Collection Time: 02/05/21  7:30 PM  Result Value Ref Range   Glucose-Capillary 169 (H) 70 - 99 mg/dL    Comment: Glucose reference range applies only to samples taken after fasting for at least 8 hours.   Comment 1 Notify RN    Comment 2 Document in Chart   Glucose, capillary     Status: Abnormal   Collection Time: 02/05/21 11:25 PM  Result Value Ref Range   Glucose-Capillary 171 (H) 70 - 99 mg/dL    Comment: Glucose reference range applies only to samples taken after fasting for at least 8 hours.   Comment 1 Notify RN    Comment 2 Document in Chart   Glucose, capillary     Status: Abnormal   Collection Time:  02/06/21  4:12 AM  Result Value Ref Range   Glucose-Capillary 166 (H) 70 - 99 mg/dL    Comment: Glucose reference range applies only to samples taken after fasting for at least 8 hours.   Comment 1 Notify RN    Comment 2 Document in Chart   Magnesium     Status: None   Collection Time: 02/06/21  4:58 AM  Result Value Ref Range   Magnesium 1.9 1.7 - 2.4 mg/dL    Comment: Performed at [facility], [address] [place], [place] [address]  Phosphorus     Status: Abnormal   Collection Time: 02/06/21  4:58 AM  Result Value Ref Range   Phosphorus 2.3 (L) 2.5 - 4.6 mg/dL    Comment: Performed at [facility], [address] [place], [place] [address]  CBC with Differential/Platelet     Status: Abnormal   Collection Time: 02/06/21  4:58 AM  Result  Value Ref Range   WBC 23.2 (H) 4.0 - 10.5 K/uL   RBC 2.11 (L) 3.87 - 5.11 MIL/uL   Hemoglobin 8.2 (L) 12.0 - 15.0 g/dL   HCT 26.2 (L) 36.0 - 46.0 %   MCV 124.2 (H) 80.0 - 100.0 fL   MCH 38.9 (H) 26.0 - 34.0 pg   MCHC 31.3 30.0 - 36.0 g/dL   RDW 27.0 (H) 11.5 - 15.5 %   Platelets 248 150 - 400 K/uL   nRBC 0.4 (H) 0.0 - 0.2 %   Neutrophils Relative % 80 %   Neutro Abs 18.5 (H) 1.7 - 7.7 K/uL   Lymphocytes Relative 9 %   Lymphs Abs 2.1 0.7 - 4.0 K/uL   Monocytes Relative 6 %   Monocytes Absolute 1.4 (H) 0.1 - 1.0 K/uL   Eosinophils Relative 3 %   Eosinophils Absolute 0.7 (H) 0.0 - 0.5 K/uL   Basophils Relative 0 %   Basophils Absolute 0.1 0.0 - 0.1 K/uL   Immature Granulocytes 2 %   Abs Immature Granulocytes 0.50 (H) 0.00 - 0.07 K/uL    Comment: Performed at [facility], [address] [place],  [address]  Ammonia     Status: Abnormal   Collection Time: 02/06/21  4:58 AM  Result Value Ref Range   Ammonia 60 (H) 9 - 35 umol/L    Comment: Performed at [facility], [address] [place],  [address]  Comprehensive metabolic panel     Status: Abnormal   Collection Time: 02/06/21  4:58 AM  Result Value Ref Range   Sodium 143 135 - 145 mmol/L   Potassium 3.4 (L) 3.5 - 5.1 mmol/L   Chloride 103 98 - 111 mmol/L   CO2 36 (H) 22 - 32 mmol/L   Glucose, Bld 184 (H) 70 - 99 mg/dL    Comment: Glucose reference range applies only to samples taken after fasting for at least 8 hours.   BUN 30 (H) 6 - 20 mg/dL   Creatinine, Ser 0.45 0.44 - 1.00 mg/dL   Calcium 8.7 (L) 8.9 - 10.3 mg/dL   Total Protein 6.6 6.5 - 8.1 g/dL   Albumin 2.3 (L) 3.5 - 5.0 g/dL   AST 192 (H) 15 - 41 U/L   ALT 94 (H) 0 - 44 U/L   Alkaline Phosphatase 138 (H) 38 - 126 U/L   Total Bilirubin 3.9 (H) 0.3 - 1.2 mg/dL   GFR, Estimated >60 >60 mL/min    Comment: (NOTE) Calculated using the CKD-EPI Creatinine Equation (  2021)    Anion gap 4 (L) 5 - 15    Comment: Performed at [facility], [address] [place], [place] [address]  Glucose, capillary     Status: Abnormal   Collection Time: 02/06/21  8:09 AM  Result Value Ref Range   Glucose-Capillary 162 (H) 70 - 99 mg/dL    Comment: Glucose reference range applies only to samples taken after fasting for at least 8 hours.  Glucose, capillary     Status: Abnormal   Collection Time: 02/06/21 11:31 AM  Result Value Ref Range   Glucose-Capillary 166 (H) 70 - 99 mg/dL    Comment: Glucose reference range applies only to samples taken after fasting for at least 8 hours.  Glucose, capillary     Status: Abnormal   Collection Time: 02/06/21  4:11 PM  Result Value Ref Range   Glucose-Capillary 162 (H) 70 - 99 mg/dL    Comment: Glucose reference range applies only to samples taken after fasting for at least 8 hours.  Glucose, capillary     Status: Abnormal   Collection Time: 02/06/21  7:46 PM  Result Value Ref Range   Glucose-Capillary 167 (H) 70 - 99 mg/dL    Comment: Glucose reference range applies only to samples taken after fasting for at least 8 hours.   Comment 1 Notify RN    Comment 2 Document in Chart   Glucose, capillary     Status: Abnormal   Collection Time: 02/06/21 11:40 PM  Result Value Ref Range   Glucose-Capillary 164 (H) 70 - 99 mg/dL    Comment: Glucose reference range applies only to samples taken after fasting for at least 8 hours.   Comment 1 Notify RN    Comment 2 Document in Chart   Magnesium     Status: None   Collection Time: 02/07/21  5:16 AM  Result Value Ref Range   Magnesium 2.0 1.7 - 2.4 mg/dL    Comment: Performed at [facility], [address] [place], [place] [address]  Phosphorus     Status: None   Collection Time: 02/07/21  5:16 AM  Result Value Ref Range   Phosphorus 4.1 2.5 - 4.6 mg/dL    Comment: Performed at [facility], [address] [place], [place] [address]  CBC with Differential/Platelet     Status: Abnormal   Collection Time: 02/07/21   5:16 AM  Result Value Ref Range   WBC 25.4 (H) 4.0 - 10.5 K/uL   RBC 2.05 (L) 3.87 - 5.11 MIL/uL   Hemoglobin 7.8 (L) 12.0 - 15.0 g/dL   HCT 25.5 (L) 36.0 - 46.0 %   MCV 124.4 (H) 80.0 - 100.0 fL   MCH 38.0 (H) 26.0 - 34.0 pg   MCHC 30.6 30.0 - 36.0 g/dL   RDW 27.4 (H) 11.5 - 15.5 %   Platelets 244 150 - 400 K/uL   nRBC 0.1 0.0 - 0.2 %   Neutrophils Relative % 84 %   Neutro Abs 21.0 (H) 1.7 - 7.7 K/uL   Lymphocytes Relative 7 %   Lymphs Abs 1.8 0.7 - 4.0 K/uL   Monocytes Relative 6 %   Monocytes Absolute 1.6 (H) 0.1 - 1.0 K/uL   Eosinophils Relative 2 %   Eosinophils Absolute 0.6 (H) 0.0 - 0.5 K/uL   Basophils Relative 0 %   Basophils Absolute 0.1 0.0 - 0.1 K/uL   WBC Morphology MORPHOLOGY UNREMARKABLE    RBC Morphology MIXED RBC POPULATION  Smear Review PLATELET CLUMPS NOTED ON SMEAR, UNABLE TO ESTIMATE    Immature Granulocytes 1 %   Abs Immature Granulocytes 0.33 (H) 0.00 - 0.07 K/uL   Polychromasia PRESENT    Basophilic Stippling PRESENT    Target Cells PRESENT     Comment: Performed at [facility], [address] [place], [place] [address]  Comprehensive metabolic panel     Status: Abnormal   Collection Time: 02/07/21  5:16 AM  Result Value Ref Range   Sodium 143 135 - 145 mmol/L   Potassium 3.2 (L) 3.5 - 5.1 mmol/L   Chloride 104 98 - 111 mmol/L   CO2 33 (H) 22 - 32 mmol/L   Glucose, Bld 205 (H) 70 - 99 mg/dL    Comment: Glucose reference range applies only to samples taken after fasting for at least 8 hours.   BUN 33 (H) 6 - 20 mg/dL   Creatinine, Ser 0.48 0.44 - 1.00 mg/dL   Calcium 8.4 (L) 8.9 - 10.3 mg/dL   Total Protein 6.7 6.5 - 8.1 g/dL   Albumin 2.2 (L) 3.5 - 5.0 g/dL   AST 173 (H) 15 - 41 U/L   ALT 87 (H) 0 - 44 U/L   Alkaline Phosphatase 135 (H) 38 - 126 U/L   Total Bilirubin 3.4 (H) 0.3 - 1.2 mg/dL   GFR, Estimated >60 >60 mL/min    Comment: (NOTE) Calculated using the CKD-EPI Creatinine Equation (2021)    Anion gap 6 5 - 15     Comment: Performed at [facility], [address] [place], [place] [address]  Protime-INR     Status: Abnormal   Collection Time: 02/07/21  5:16 AM  Result Value Ref Range   Prothrombin Time 15.6 (H) 11.4 - 15.2 seconds   INR 1.2 0.8 - 1.2    Comment: (NOTE) INR goal varies based on device and disease states. Performed at [facility], [address] [place], [place] [address]   Glucose, capillary     Status: Abnormal   Collection Time: 02/07/21  7:37 AM  Result Value Ref Range   Glucose-Capillary 186 (H) 70 - 99 mg/dL    Comment: Glucose reference range applies only to samples taken after fasting for at least 8 hours.    Recent Results (from the past 240 hour(s))  Culture, Respiratory w Gram Stain     Status: None   Collection Time: 01/29/21  9:14 AM   Specimen: Tracheal Aspirate; Respiratory  Result Value Ref Range Status   Specimen Description   Final    TRACHEAL ASPIRATE Performed at [facility], [address], [place], [place] [address]    Special Requests   Final    NONE Performed at [facility], [place], [place] [address]    Gram Stain   Final    ABUNDANT WBC PRESENT,BOTH PMN AND MONONUCLEAR RARE YEAST    Culture   Final    RARE Normal respiratory flora-no Staph aureus or Pseudomonas seen Performed at [facility] [address] [place],  [address]    Report Status 01/31/2021 FINAL  Final  MRSA Next Gen by PCR, Nasal     Status: None   Collection Time: 01/29/21 11:35 AM   Specimen: Nasal Mucosa; Nasal Swab  Result Value Ref Range Status   MRSA by PCR Next Gen NOT DETECTED NOT DETECTED Final    Comment: (NOTE) The GeneXpert MRSA Assay (FDA approved for NASAL specimens only),  is one component of a comprehensive MRSA colonization surveillance program. It is not intended to diagnose MRSA infection nor to guide or monitor treatment for MRSA infections. Test performance is not FDA approved  in patients less than 10 years old. Performed at [facility], [address] [place], [place] [address]   Body fluid culture w Gram Stain     Status: None (Preliminary result)   Collection Time: 02/04/21  2:23 PM   Specimen: PATH Cytology Peritoneal fluid  Result Value Ref Range Status   Specimen Description   Final    PERITONEAL Performed at [facility], [address] [place], [place] [address]    Special Requests   Final    NONE Performed at [facility], [address] [place], [place] [address]    Gram Stain   Final    NO SQUAMOUS EPITHELIAL CELLS SEEN NO WBC SEEN NO ORGANISMS SEEN    Culture   Final    NO GROWTH 3 DAYS Performed at [facility], [place] [address] [place], [place] [address]    Report Status PENDING  Incomplete    Lipid Panel No results for input(s): CHOL, TRIG, HDL, CHOLHDL, VLDL, LDLCALC in the last 72 hours.  Studies/Results: DG Chest Port 1 View  Result Date: 02/07/2021 CLINICAL DATA:  Acute respiratory failure with hypoxia EXAM: PORTABLE CHEST 1 VIEW COMPARISON:  02/04/2021 chest radiograph. FINDINGS: Endotracheal tube tip is 2.5 cm above the carina. Enteric tube enters stomach with the tip not seen on this image. Esophageal temperature probe terminates in the midthoracic esophagus. Stable cardiomediastinal silhouette with mild cardiomegaly. No pneumothorax. No pleural effusion. Patchy hazy opacities in both lungs with streaky prominent perihilar interstitial markings, not appreciably changed. IMPRESSION: 1. Well-positioned support structures. 2. Stable mild cardiomegaly with patchy hazy opacities in both lungs and streaky prominent perihilar interstitial markings, indeterminate for pulmonary edema versus pneumonia. Electronically Signed   By: Ilona Sorrel M.D.   On: 02/07/2021 07:11    Medications: Scheduled:  vitamin C  500 mg Per Tube BID   aspirin  81 mg Per Tube Daily   chlorhexidine gluconate (MEDLINE KIT)   15 mL Mouth Rinse BID   Chlorhexidine Gluconate Cloth  6 each Topical Daily   docusate  100 mg Per Tube BID   feeding supplement (PROSource TF)  45 mL Per Tube Daily   folic acid  1 mg Per Tube Daily   free water  200 mL Per Tube Q4H   heparin injection (subcutaneous)  5,000 Units Subcutaneous Q8H   influenza vac split quadrivalent PF  0.5 mL Intramuscular Tomorrow-1000   insulin aspart  0-15 Units Subcutaneous Q4H   ipratropium-albuterol  3 mL Nebulization TID   lactulose  30 g Per Tube BID   mouth rinse  15 mL Mouth Rinse 10 times per day   midodrine  5 mg Per Tube TID WC   pantoprazole sodium  40 mg Per Tube Q1200   polyethylene glycol  17 g Per Tube Daily   rifaximin  550 mg Per Tube BID   sodium chloride flush  10-40 mL Intracatheter Q12H   thiamine injection  100 mg Intravenous Daily   zinc sulfate  220 mg Per Tube Daily   Continuous:  sodium chloride 5 mL/hr at 02/07/21 0600   feeding supplement (VITAL 1.5 CAL) 1,000 mL (02/05/21 2307)   potassium chloride 10 mEq (02/07/21 0724)    Assessment: 55 yo woman with hx MDD, EtOH abuse (active  c/b), liver cirrhosis, tobacco abuse, multiple previous hospital admissions for EtOH withdrawal who presented to ED 01/20/21 with hepatic encephalopathy, jaundice, and abd pain. She was admitted to floor but ultimately trasferred to ICU for unstable vital signs and hyperactive delirium c/f DTs. MRI brain 10/30 showed acute/subacute paramedian pontine ischemic infarct. Encephalopathy likely multifactorial in setting of hyperammonemia, hepatitis, aspiration PNA, acute/subacute ischemic pontine infarct.  - Exam improving in terms of cognitive function. Now with eyes spontaneously open, will gaze to the left and right on request, will nod head to questioning and will wiggle toes to command. Still with flaccid tone x 4. Will move lower extremities with 2/5 strength to command and noxious, but with no movement of upper extremities to any stimuli. DDx for  tetraparesis includes severe fatigue due to intercurrent illness, ICU neuropathy, ICU myopathy and statin myopathy versus possible central cord lesion within the cervical spine or extension of recent pontine stroke.  - Recent B12 level was normal.  - MRI brain (10/30): Acute/subacute 9 mm right paramedian nonhemorrhagic infarct in the central pons. Symmetric T2 signal changes and restricted diffusion in the thalami bilaterally. This is nonspecific, but may represent artery of [place] infarcts versus hepatic encephalopathy changes given the striking symmetry of the thalamic lesions - CTA head and neck: No acute intracranial abnormality. A 6 mm left paraclinoid ICA aneurysm. Consult to neuro intervention suggested. No intracranial large vessel occlusion or hemodynamically significant stenosis. Intracranial stenosis described on prior MR angiogram are likely artifactual. Patent major neck arteries without hemodynamically significant stenosis. - EEG (10/31): Findings suggestive of moderate to severe diffuse encephalopathy, nonspecific etiology. No seizures or epileptiform discharges were seen throughout the recording.  - Encephalopathy is multifactorial. Main contributor is hepatic encephalopathy with severe hyperammonemia. Subtle thalamic and basal ganglia diffuse signal changes on MRI are compatible with a severe metabolic encephalopathy. Other contributing factors include alcohol withdrawal, hospital delirium and infection.  - Moderate-severe alcohol withdrawal with delirium   Recommendations: - Monitor and manage her hyperammonemia and acute alcoholic hepatitis.  - Agree with thiamine and CIWA protocol. Will increase thiamine to 500 mg IV TID x 3 days, then would resume 100 mg QD - MRI of cervical spine (ordered) - Repeat MRI of brain (ordered) - q4 hr neuro checks - Inpatient seizure precautions - Telemetry - ASA 39m daily - Would not resume statin now or in the future - Vitamin E and copper  levels  35 minutes spent in the neurological evaluation and management of this critically ill patient  Addendum: - MRI brain reveals new scattered small volume subarachnoid hemorrhage over both cerebral convexities and small amount of intraventricular hemorrhage dependently within the lateral ventricles. Unchanged size of subacute pontine infarct. Resolved thalamic diffusion abnormality. - MRI cervical spine does not show any spinal cord abnormality. Degenerative changes of the vertebral column are noted.  - Discontinuing ASA.  - The subarachoid blood is most likely secondary to a coagulation disorder in the setting of the patient's liver disease. The patchy distribution does not strongly suggest an aneurysmal bleed, but the 6 mm left paraclinoid ICA aneurysm seen on CTA could be a source. Will need repeat CTA of head to assess for possible active bleeding from the aneurysm (ordered). Also will need repeat CT scan in 24 hours to assess for stability (ordered).    LOS: 18 days   '@Electronically'  signed: Dr. EKerney Elbe11/09/2020  8:14 AM

## 2021-02-07 NOTE — Progress Notes (Signed)
NAME:  [name] Carrillo, MRN:  [id number], DOB:  09-23-1965, LOS: 18 ADMISSION DATE:  01/20/2021, CONSULTATION DATE:  01/24/21 REFERRING MD:  Priscella Mann, CHIEF COMPLAINT:  unresponsive   History of Present Illness:  55 yo F wthi hx of MDD, Anxiety disorder, osteomyelitis of left tibia, left tibial fracture history, recurrent UTIs, EtOH abuse and liver Cirrhosis ([place]), active alcoholism, tobacco abuse with multiple readmissions to hospital with EtOH withdrawal syndrome and now being brought into MICU due to unstable vital signs and aggitated hyperactive delerium with concerns for delerium tremens. Blood work is notable for transaminitis with decreased synthetic function.     Patient had ziplock bag with two teeth in there due to loose teeth in mouth. Husband shares that patient has few false teeth that she had created herself.    Ive met with husband and son and explained that patient is unresponsive with GCS7 and is not able to protect airway. They understand the severity of situation and agree for intubation if needed. We discussed goals of care and patient is full code.   Pertinent  Medical History  MDD Anxiety Alcohol use disorder Alcoholic cirrhosis Smoking  Significant Hospital Events: Including procedures, antibiotic start and stop dates in addition to other pertinent events   10/19 Pt admitted to the [facility] unit with delirium tremors secondary to ETOH withdrawal  10/19: CT Head negative  10/19: CT Abd/Pelvis revealed Hepatomegaly with heterogeneous liver enhancement and evidence of portal venous hypertension including recanalized paraumbilical vein, small spleno-renal varices, and small volume ascites. Chronic liver disease strongly suspected although the enhancement pattern might indicate a superimposed acute hepatitis. No evidence of bile duct obstruction. No discrete liver lesion. No obstructive uropathy but absent renal contrast excretion on the delayed images suggesting acute renal  insufficiency. Punctate nephrolithiasis. Borderline to mild cardiomegaly. Bilateral lower lobe atelectasis. Mild aortic atherosclerosis.  10/24 Transfer to ICU, intubation, CVL placed 10/25: Echo revealed EF 60 to 65% 10/28: Pt with hepatic encephalopathy currently undergoing SBT 10/5.  14L positive will administer 40 mg iv lasix x1 dose  10/28: CT Head motion limited exam. Allowing for this, no acute intracranial abnormality. 10/28: CT Maxillofacial revealed prominent dental caries within the residual right maxillary molar. No associated inflammation. No other focal dental or periodontal disease to suggest infection. Minimal inflammatory changes about the lateral right orbit. No abscess. 10/31: CTA Head revealed no acute intracranial abnormality. A 6 mm left paraclinoid ICA aneurysm. Consult to neuro intervention suggested. No intracranial large vessel occlusion or hemodynamically significant stenosis. Intracranial stenosis described on prior MR angiogram are likely artifactual. Patent major neck arteries without hemodynamically significant stenosis. 10/31: MRA Head/Neck revealed medially directed 6 mm aneurysm from the distal left cavernous carotid/ophthalmic segment. Poor visualization of the right cavernous carotid, which may indicate slow flow or stenosis. Consider CTA for further evaluation. Apparent narrowing of the distal basilar artery, which may be artifactual or indicate focal stenosis. No hemodynamically significant stenosis in the neck. No large vessel occlusion. 10/31: EEG suggestive of moderate to severe diffuse encephalopathy, nonspecific etiology. No seizures or epileptiform discharges were seen throughout the recording.  11/1: Pt remains severely encephalopathic despite sedation remaining off since 10/31 _0 :22 am  11/2: Remains encephalopathic off sedation.  ENT consulted for [facility], family wishes to proceed next week. 11/3: No change in mental status, remains encephalopathic.  Hypernatremia unchanged (150), increase Free Water flushes to 250 cc q4h, hold off on diuresis.  S/p paracentesis with removal of 1.5L of fluid  11/5: Opens eyes  to voice but not following commands, plan to diurese today 11/6 remains intubated  Interim History / Subjective:  Severe encephalopathy Severe respiratory  failure Remains critically ill Needs TRACH  Objective   Blood pressure 112/60, pulse 91, temperature 98.8 F (37.1 C), resp. rate (!) 21, height _0  (1.651 m), weight 97.4 kg, last menstrual period 09/07/2015, SpO2 96 %.    Vent Mode: PRVC FiO2 (%):  [45 %] 45 % Set Rate:  [20 bmp] 20 bmp Vt Set:  [400 mL] 400 mL PEEP:  [5 cmH20] 5 cmH20 Plateau Pressure:  [16 cmH20-19 cmH20] 18 cmH20   Intake/Output Summary (Last 24 hours) at 02/07/2021 0719 Last data filed at 02/07/2021 0600 Gross per 24 hour  Intake 3058.6 ml  Output 2015 ml  Net 1043.6 ml    Filed Weights   02/05/21 0500 02/06/21 0451 02/07/21 0421  Weight: 92.8 kg 95.3 kg 97.4 kg    REVIEW OF SYSTEMS  PATIENT IS UNABLE TO PROVIDE COMPLETE REVIEW OF SYSTEMS DUE TO SEVERE CRITICAL ILLNESS AND TOXIC METABOLIC ENCEPHALOPATHY    PHYSICAL EXAMINATION:  GENERAL:critically ill appearing, +resp distress EYES: Pupils equal, round, reactive to light.  No scleral icterus.  MOUTH: Moist mucosal membrane. INTUBATED NECK: Supple.  PULMONARY: +rhonchi, +wheezing CARDIOVASCULAR: S1 and S2.  No murmurs  GASTROINTESTINAL: Soft, nontender, -distended. Positive bowel sounds.  MUSCULOSKELETAL: No swelling, clubbing, or edema.  NEUROLOGIC: obtunded SKIN:intact,warm,dry    Resolved Hospital Problem list   Thrombocytopenia   Assessment & Plan:   55 yo WF with severe hypoxic respiratory failure aspiration pneumonia failed MV twice with severe and Acute encephalopathy multifactorial secondary acute/subacute ischemic pontine infarct, hyperammonemia and infectious process  Moderate-severe alcohol withdrawal with  delirium Sedation needs in setting of mechanical ventilation  Severe ACUTE Hypoxic and Hypercapnic Respiratory Failure -continue Mechanical Ventilator support -continue Bronchodilator Therapy -Wean Fio2 and PEEP as tolerated -VAP/VENT bundle implementation -will perform SAT/SBT when respiratory parameters are met PLAN FOR [facility] NEXT WEEK Tuesday   GI-LIVER CIRRHOSIS/ENCEPHALOPATHY GI PROPHYLAXIS as indicated NUTRITIONAL STATUS DIET-->TF's as tolerated Constipation protocol as indicated   NEUROLOGY ACUTE TOXIC METABOLIC ENCEPHALOPATHY/HEPATIC ENCEPHALOPATHY -Continue to hold all sedation for now to assess neurological status  -Neurology following, appreciate input~per neuro aneurysm on CTA Head/Neck and MRA Head Neck can be followed in the outpatient setting  -Monitor CIWA protocol -Continue thiamine, folate and multivitamins -Continue lactulose 30 g per tube bid and rifaximin  -Trend ammonia    INFECTIOUS DISEASE  UTI due to E. coli/Klebsiella~completed course of ceftriaxone  Worsening leukocytosis secondary to aspiration  pneumonia ~ treated -continue antibiotics as prescribed -follow up cultures -follow up ID consultation   Acute alcoholic hepatitis Ascites  -Monitor hepatic function panel  -Pt s/p paracentesis with removal of 1.5L~11/3  Hypernatremia ~ resolved -Monitor I&O's / urinary output -Follow BMP -Ensure adequate renal perfusion -Avoid nephrotoxic agents as able -Replace electrolytes as indicated -Pharmacy following for assistance with electrolyte replacement -Continue free water flushes 200 cc q4h Na 143   Severe protein calorie malnutrition Dietitian consulted appreciate input~continue tube feeds   Best Practice (right click and "Reselect all SmartList Selections" daily)   Diet/type: tubefeeds DVT prophylaxis: SCD; subq heparin  GI prophylaxis: PPI Lines: N/A Foley:  Yes and still indicated  Code Status:  full code Last date of  multidisciplinary goals of care discussion [02/06/2021]    DVT/GI PRX  assessed I Assessed the need for Labs I Assessed the need for Foley I Assessed the need for Central Venous Line Family  Discussion when available I Assessed the need for Mobilization I made an Assessment of medications to be adjusted accordingly Safety Risk assessment completed  CASE DISCUSSED IN MULTIDISCIPLINARY ROUNDS WITH ICU TEAM     Critical Care Time devoted to patient care services described in this note is 45 minutes.  Critical care was necessary to treat /prevent imminent and life-threatening deterioration. Overall, patient is critically ill, prognosis is guarded.  Patient with Multiorgan failure and at high risk for cardiac arrest and death.    Corrin Parker, M.D.  [facility]  Medical Director [place] Director [facility] Cardio-Pulmonary Department

## 2021-02-08 ENCOUNTER — Inpatient Hospital Stay: Payer: BC Managed Care – PPO

## 2021-02-08 DIAGNOSIS — S066X9D Traumatic subarachnoid hemorrhage with loss of consciousness of unspecified duration, subsequent encounter: Secondary | ICD-10-CM

## 2021-02-08 DIAGNOSIS — G9341 Metabolic encephalopathy: Secondary | ICD-10-CM | POA: Diagnosis not present

## 2021-02-08 LAB — CBC WITH DIFFERENTIAL/PLATELET
Abs Immature Granulocytes: 0.24 10*3/uL — ABNORMAL HIGH (ref 0.00–0.07)
Basophils Absolute: 0.1 10*3/uL (ref 0.0–0.1)
Basophils Relative: 0 %
Eosinophils Absolute: 0.6 10*3/uL — ABNORMAL HIGH (ref 0.0–0.5)
Eosinophils Relative: 3 %
HCT: 25.1 % — ABNORMAL LOW (ref 36.0–46.0)
Hemoglobin: 7.7 g/dL — ABNORMAL LOW (ref 12.0–15.0)
Immature Granulocytes: 1 %
Lymphocytes Relative: 7 %
Lymphs Abs: 1.6 10*3/uL (ref 0.7–4.0)
MCH: 39.1 pg — ABNORMAL HIGH (ref 26.0–34.0)
MCHC: 30.7 g/dL (ref 30.0–36.0)
MCV: 127.4 fL — ABNORMAL HIGH (ref 80.0–100.0)
Monocytes Absolute: 1.6 10*3/uL — ABNORMAL HIGH (ref 0.1–1.0)
Monocytes Relative: 7 %
Neutro Abs: 19.9 10*3/uL — ABNORMAL HIGH (ref 1.7–7.7)
Neutrophils Relative %: 82 %
Platelets: 227 10*3/uL (ref 150–400)
RBC: 1.97 MIL/uL — ABNORMAL LOW (ref 3.87–5.11)
RDW: 27.5 % — ABNORMAL HIGH (ref 11.5–15.5)
Smear Review: NORMAL
WBC: 24.1 10*3/uL — ABNORMAL HIGH (ref 4.0–10.5)
nRBC: 0.1 % (ref 0.0–0.2)

## 2021-02-08 LAB — GLUCOSE, CAPILLARY
Glucose-Capillary: 177 mg/dL — ABNORMAL HIGH (ref 70–99)
Glucose-Capillary: 169 mg/dL — ABNORMAL HIGH (ref 70–99)
Glucose-Capillary: 135 mg/dL — ABNORMAL HIGH (ref 70–99)
Glucose-Capillary: 143 mg/dL — ABNORMAL HIGH (ref 70–99)
Glucose-Capillary: 157 mg/dL — ABNORMAL HIGH (ref 70–99)
Glucose-Capillary: 154 mg/dL — ABNORMAL HIGH (ref 70–99)

## 2021-02-08 LAB — COMPREHENSIVE METABOLIC PANEL
ALT: 79 U/L — ABNORMAL HIGH (ref 0–44)
AST: 152 U/L — ABNORMAL HIGH (ref 15–41)
Albumin: 1.9 g/dL — ABNORMAL LOW (ref 3.5–5.0)
Alkaline Phosphatase: 140 U/L — ABNORMAL HIGH (ref 38–126)
Anion gap: 5 (ref 5–15)
BUN: 29 mg/dL — ABNORMAL HIGH (ref 6–20)
CO2: 36 mmol/L — ABNORMAL HIGH (ref 22–32)
Calcium: 8.7 mg/dL — ABNORMAL LOW (ref 8.9–10.3)
Chloride: 104 mmol/L (ref 98–111)
Creatinine, Ser: 0.54 mg/dL (ref 0.44–1.00)
GFR, Estimated: >60 mL/min (ref >60)
Glucose, Bld: 179 mg/dL — ABNORMAL HIGH (ref 70–99)
Potassium: 3.8 mmol/L (ref 3.5–5.1)
Sodium: 145 mmol/L (ref 135–145)
Total Bilirubin: 3.1 mg/dL — ABNORMAL HIGH (ref 0.3–1.2)
Total Protein: 6.5 g/dL (ref 6.5–8.1)

## 2021-02-08 LAB — PROTIME-INR
INR: 1.2 (ref 0.8–1.2)
Prothrombin Time: 15.2 seconds (ref 11.4–15.2)

## 2021-02-08 LAB — BODY FLUID CULTURE W GRAM STAIN
Culture: NO GROWTH
Gram Stain: NONE SEEN

## 2021-02-08 LAB — PHOSPHORUS: Phosphorus: 2.8 mg/dL (ref 2.5–4.6)

## 2021-02-08 LAB — MAGNESIUM: Magnesium: 2 mg/dL (ref 1.7–2.4)

## 2021-02-08 LAB — APTT: aPTT: 33 seconds (ref 24–36)

## 2021-02-08 IMAGING — CT CT HEAD W/O CM
3 of 5 series · 15 of 47 positions shown, 18 images · non-contrast
Comparison: CT angiogram head [DATE].  Brain MRI [DATE].

CLINICAL DATA: Subarachnoid hemorrhage (ARDORA), follow-up. Additional
history provided: Patient intubated, follow-up scan.

EXAM:
CT HEAD WITHOUT CONTRAST
TECHNIQUE: Contiguous axial images were obtained from the base of the skull
through the vertex without intravenous contrast.

[Series 3: head wo · axial · 0.42mm/px · z∈[-192,-72]mm · 9 of 31 slices shown, 12 images]
[im 4/31  brain]
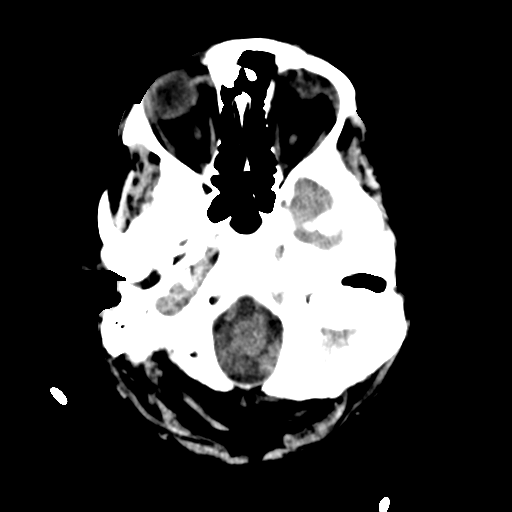
[im 4/31  bone]
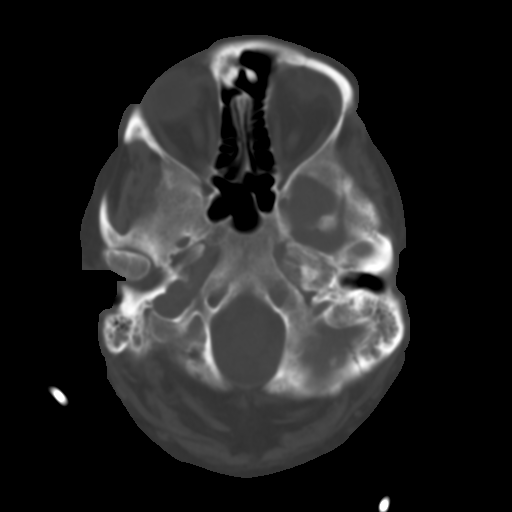
[im 7/31  brain]
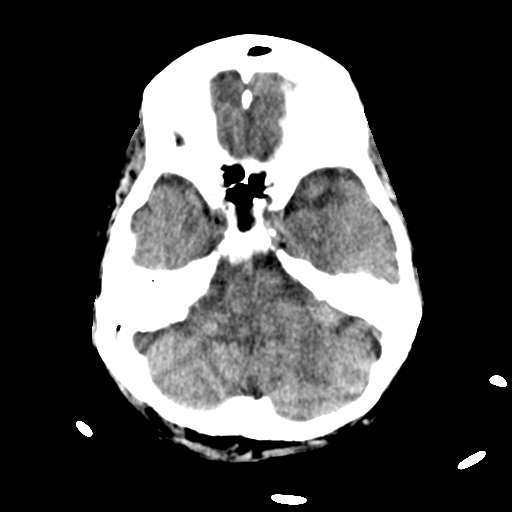
[im 10/31  brain]
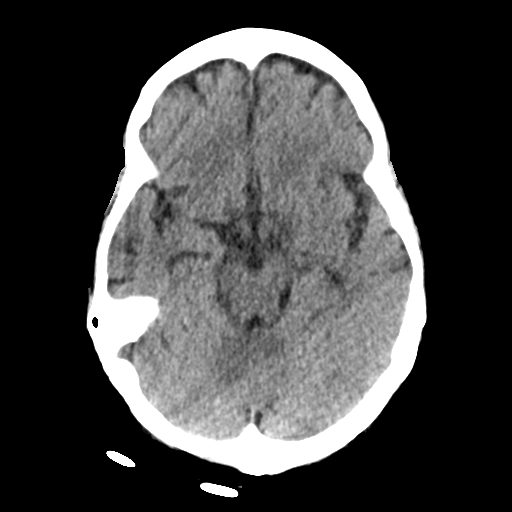
[im 13/31  brain]
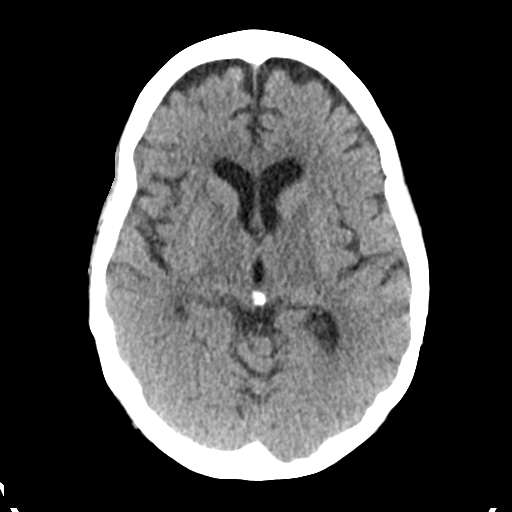
[im 16/31  brain]
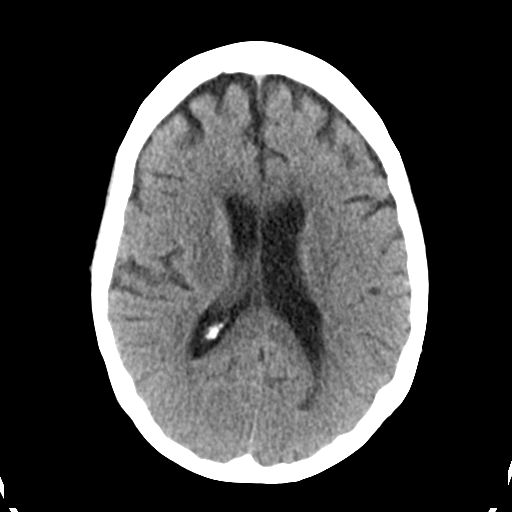
[im 16/31  bone]
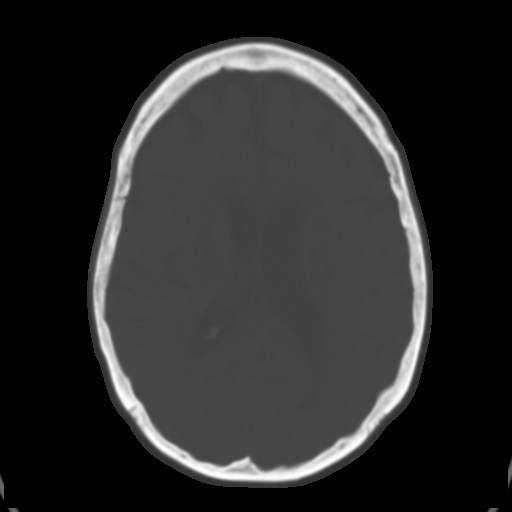
[im 19/31  brain]
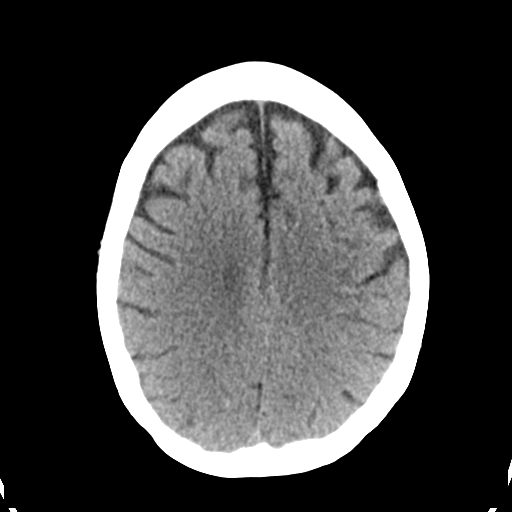
[im 22/31  brain]
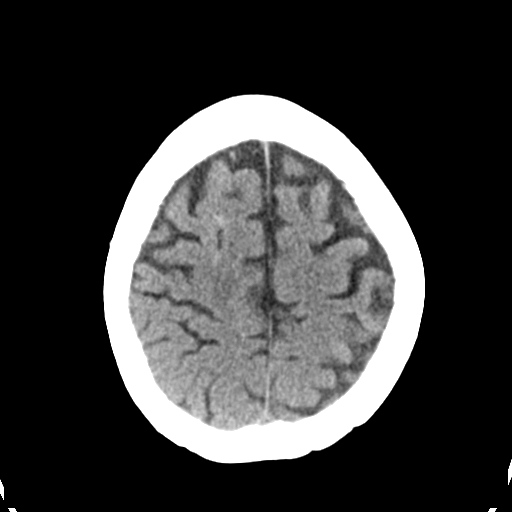
[im 25/31  brain]
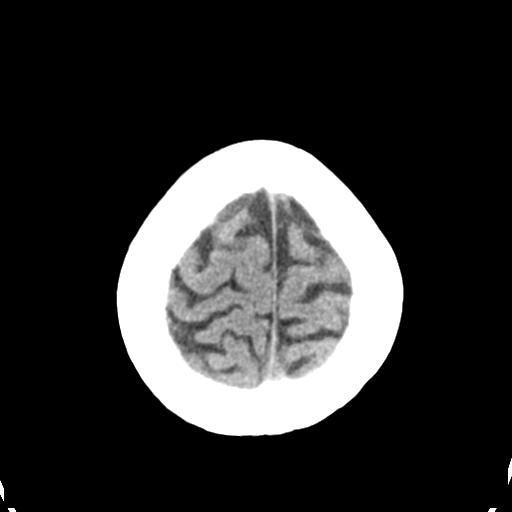
[im 28/31  brain]
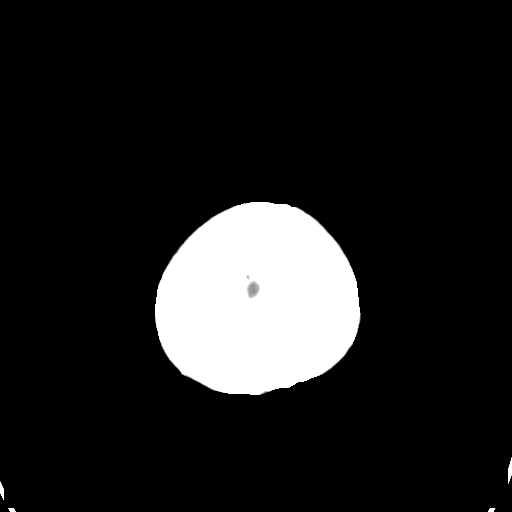
[im 28/31  bone]
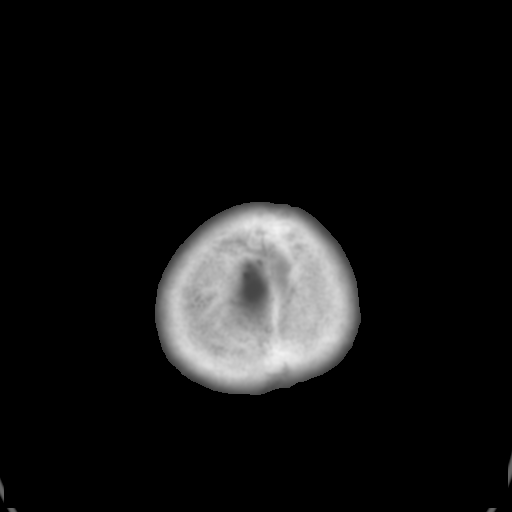

[Series 4: coronal soft tissue · coronal · 0.32mm/px · 3 of 64 slices shown]
[im 22/64  brain]
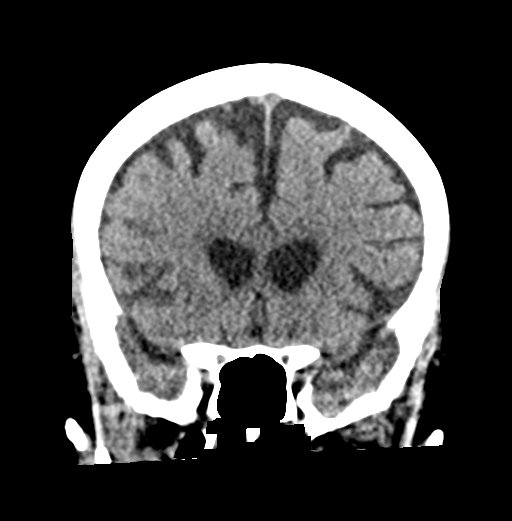
[im 29/64  brain]
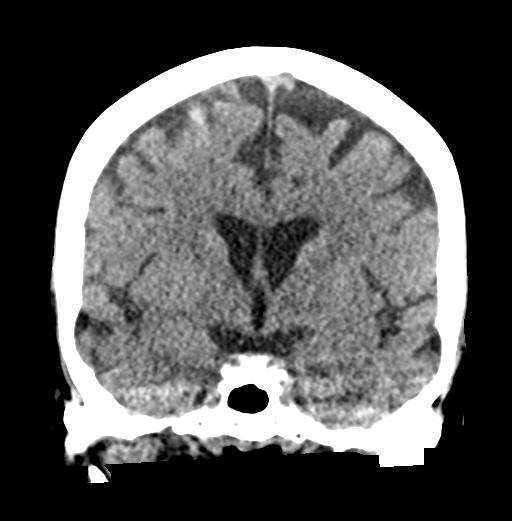
[im 36/64  brain]
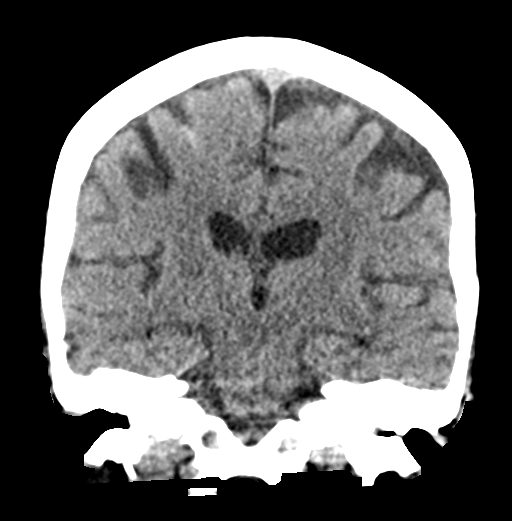

[Series 5: sagittal soft tissue · sagittal · 0.33mm/px · 3 of 53 slices shown]
[im 18/53  brain]
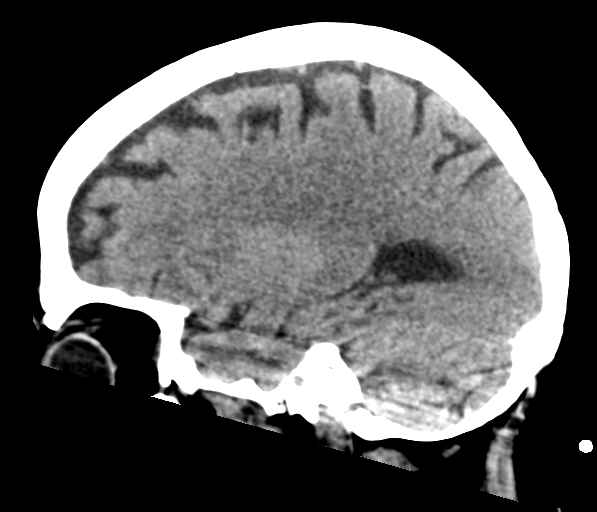
[im 27/53  brain]
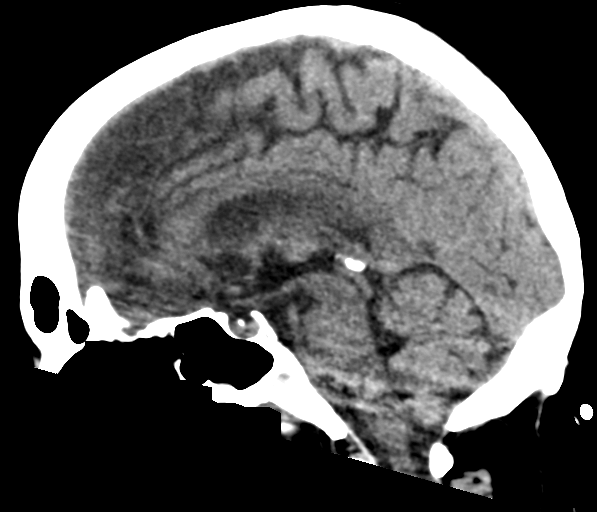
[im 35/53  brain]
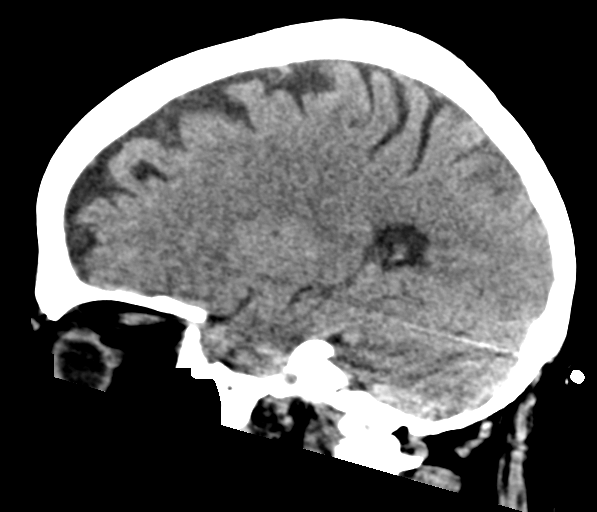

[15 of 47 positions shown; findings below may reference images not displayed]

FINDINGS: Brain:

Persistent small-volume subarachnoid hemorrhage overlying the
bilateral cerebral hemispheres, now predominantly overlying the high
right frontal lobe. Redemonstrated small volume hemorrhage layering
within the occipital horns of the lateral ventricles (left greater
than right). No significant interval intracranial hemorrhage is
identified. No evidence of hydrocephalus.

A known subacute pontine infarct was better appreciated on the
recent prior brain MRI of [DATE].

No acute demarcated cortical infarct is identified.

No evidence of an intracranial mass.

No midline shift.

Vascular: No hyperdense vessel.  Atherosclerotic calcifications.

Skull: No calvarial fracture.

Sinuses/Orbits: Visualized orbits show no acute finding. Trace
mucosal thickening within a posterior right ethmoid air cell.

Other: Bilateral mastoid effusions.
IMPRESSION: Persistent small-volume subarachnoid hemorrhage overlying the
bilateral cerebral hemispheres, now predominantly overlying the high
right frontal lobe. Redemonstrated small-volume hemorrhage layering
within the occipital horns of the lateral ventricles (left greater
than right). No significant interval intracranial hemorrhage is
identified. No evidence of acute cortical infarct or hydrocephalus.

Known subacute pontine infarct, better appreciated on the prior
brain MRI.

Bilateral mastoid effusions.

## 2021-02-08 MED ORDER — METOLAZONE 2.5 MG PO TABS
2.5000 mg | ORAL_TABLET | Freq: Once | ORAL | Status: AC
  Administered 2021-02-08: 2.5 mg
  Filled 2021-02-08: qty 1

## 2021-02-08 MED ORDER — ACETAZOLAMIDE 250 MG PO TABS
500.0000 mg | ORAL_TABLET | Freq: Once | ORAL | Status: AC
  Administered 2021-02-08: 500 mg
  Filled 2021-02-08: qty 2

## 2021-02-08 NOTE — Progress Notes (Signed)
Neurology progress note  Subjective: Continues to be more awake. Following simple commands.   Interval hx - MRI brain reveals new scattered small volume subarachnoid hemorrhage over both cerebral convexities and small amount of intraventricular hemorrhage dependently within the lateral ventricles. Unchanged size of subacute pontine infarct. Resolved thalamic diffusion abnormality. - MRI cervical spine does not show any spinal cord abnormality. Degenerative changes of the vertebral column are noted.  - Discontinuing ASA.  - The subarachoid blood is most likely secondary to a coagulation disorder in the setting of the patient's liver disease. The patchy distribution does not strongly suggest an aneurysmal bleed. Repeat CTA H&N showed unchanged 53m L paraclinoid ICA aneurysm. - Repeat head CT today showed overall stability: Persistent small-volume subarachnoid hemorrhage overlying the bilateral cerebral hemispheres, now predominantly overlying the high right frontal lobe. Redemonstrated small-volume hemorrhage layering within the occipital horns of the lateral ventricles (left greater than right). No significant interval intracranial hemorrhage is identified. No evidence of acute cortical infarct or hydrocephalus.   Objective: Current vital signs: BP (!) 102/52   Pulse 77   Temp 98.9 F (37.2 C)   Resp (!) 22   Ht _0  (1.651 m)   Wt 95 kg   LMP 09/07/2015   SpO2 97%   BMI 34.85 kg/m  Vital signs in last 24 hours: Temp:  [98.6 F (37 C)-99.5 F (37.5 C)] 98.9 F (37.2 C) (11/07 1200) Pulse Rate:  [77-99] 77 (11/07 1800) Resp:  [19-27] 22 (11/07 1800) BP: (100-118)/(48-62) 102/52 (11/07 1800) SpO2:  [93 %-98 %] 97 % (11/07 1800) FiO2 (%):  [35 %-40 %] 35 % (11/07 1724) Weight:  [95 kg] 95 kg (11/07 0500)  Intake/Output from previous day: 11/06 0701 - 11/07 0700 In: 1151.6 [I.V.:81.6; NG/GT:870; IV Piggyback:200] Out: 4000 [Urine:3050; Stool:950] Intake/Output this  shift: Total I/O In: 180 [P.O.:120; I.V.:30; Other:30] Out: 468[Urine:210; Stool:200] Nutritional status:  Diet Order             Diet NPO time specified  Diet effective now                  HEENT: [place]/AT. Scleral edema with icterus.  Lungs: Intubated Ext: No cyanosis or pallor. Diffuse limb edema.    Neurologic Exam: Ment: Eyes are open and follows simple commands. Weakly nods yes or no in response to simple questions. CN: PERRL and sluggish. Eyes are conjugate; will gaze slowly to left and then to right when asked. No nystagmus. Face symmetric.  Motor/Sensory: Flaccid tone in BUE with no movement proximally or distally to noxious or to repeated requests to wiggle fingers. This is noted in the context of diffuse limb edema.  BLE with flaccid tone. Will weakly withdraw with 2/5 strength to plantar stimulation. Will weakly wiggle toes on right and left foot when asked.  Reflexes: Absent brachioradialis, biceps, patellar and achilles reflexes. Toes mute bilaterally    Lab Results: Results for orders placed or performed during the hospital encounter of 01/20/21 (from the past 48 hour(s))  Glucose, capillary     Status: Abnormal   Collection Time: 02/06/21  7:46 PM  Result Value Ref Range   Glucose-Capillary 167 (H) 70 - 99 mg/dL    Comment: Glucose reference range applies only to samples taken after fasting for at least 8 hours.   Comment 1 Notify RN    Comment 2 Document in Chart   Glucose, capillary     Status: Abnormal   Collection Time: 02/06/21 11:40 PM  Result Value Ref Range   Glucose-Capillary 164 (H) 70 - 99 mg/dL    Comment: Glucose reference range applies only to samples taken after fasting for at least 8 hours.   Comment 1 Notify RN    Comment 2 Document in Chart   Glucose, capillary     Status: Abnormal   Collection Time: 02/07/21  4:26 AM  Result Value Ref Range   Glucose-Capillary 191 (H) 70 - 99 mg/dL    Comment: Glucose reference range applies only to  samples taken after fasting for at least 8 hours.   Comment 1 Notify RN    Comment 2 Document in Chart   Magnesium     Status: None   Collection Time: 02/07/21  5:16 AM  Result Value Ref Range   Magnesium 2.0 1.7 - 2.4 mg/dL    Comment: Performed at [facility], [address] [place], [place] [address]  Phosphorus     Status: None   Collection Time: 02/07/21  5:16 AM  Result Value Ref Range   Phosphorus 4.1 2.5 - 4.6 mg/dL    Comment: Performed at [facility], [address] [place], [place] [address]  CBC with Differential/Platelet     Status: Abnormal   Collection Time: 02/07/21  5:16 AM  Result Value Ref Range   WBC 25.4 (H) 4.0 - 10.5 K/uL   RBC 2.05 (L) 3.87 - 5.11 MIL/uL   Hemoglobin 7.8 (L) 12.0 - 15.0 g/dL   HCT 25.5 (L) 36.0 - 46.0 %   MCV 124.4 (H) 80.0 - 100.0 fL   MCH 38.0 (H) 26.0 - 34.0 pg   MCHC 30.6 30.0 - 36.0 g/dL   RDW 27.4 (H) 11.5 - 15.5 %   Platelets 244 150 - 400 K/uL   nRBC 0.1 0.0 - 0.2 %   Neutrophils Relative % 84 %   Neutro Abs 21.0 (H) 1.7 - 7.7 K/uL   Lymphocytes Relative 7 %   Lymphs Abs 1.8 0.7 - 4.0 K/uL   Monocytes Relative 6 %   Monocytes Absolute 1.6 (H) 0.1 - 1.0 K/uL   Eosinophils Relative 2 %   Eosinophils Absolute 0.6 (H) 0.0 - 0.5 K/uL   Basophils Relative 0 %   Basophils Absolute 0.1 0.0 - 0.1 K/uL   WBC Morphology MORPHOLOGY UNREMARKABLE    RBC Morphology MIXED RBC POPULATION    Smear Review PLATELET CLUMPS NOTED ON SMEAR, UNABLE TO ESTIMATE    Immature Granulocytes 1 %   Abs Immature Granulocytes 0.33 (H) 0.00 - 0.07 K/uL   Polychromasia PRESENT    Basophilic Stippling PRESENT    Target Cells PRESENT     Comment: Performed at [facility], [address] [place],  [address]  Comprehensive metabolic panel     Status: Abnormal   Collection Time: 02/07/21  5:16 AM  Result Value Ref Range   Sodium 143 135 - 145 mmol/L   Potassium 3.2 (L) 3.5 - 5.1 mmol/L   Chloride 104 98 - 111 mmol/L    CO2 33 (H) 22 - 32 mmol/L   Glucose, Bld 205 (H) 70 - 99 mg/dL    Comment: Glucose reference range applies only to samples taken after fasting for at least 8 hours.   BUN 33 (H) 6 - 20 mg/dL   Creatinine, Ser 0.48 0.44 - 1.00 mg/dL   Calcium 8.4 (L) 8.9 - 10.3 mg/dL   Total Protein 6.7 6.5 - 8.1 g/dL   Albumin 2.2 (L) 3.5 -  5.0 g/dL   AST 173 (H) 15 - 41 U/L   ALT 87 (H) 0 - 44 U/L   Alkaline Phosphatase 135 (H) 38 - 126 U/L   Total Bilirubin 3.4 (H) 0.3 - 1.2 mg/dL   GFR, Estimated >60 >60 mL/min    Comment: (NOTE) Calculated using the CKD-EPI Creatinine Equation (2021)    Anion gap 6 5 - 15    Comment: Performed at [facility], [address] [place], [place] [address]  Protime-INR     Status: Abnormal   Collection Time: 02/07/21  5:16 AM  Result Value Ref Range   Prothrombin Time 15.6 (H) 11.4 - 15.2 seconds   INR 1.2 0.8 - 1.2    Comment: (NOTE) INR goal varies based on device and disease states. Performed at [facility], [address] [place], [place] [address]   Glucose, capillary     Status: Abnormal   Collection Time: 02/07/21  7:37 AM  Result Value Ref Range   Glucose-Capillary 186 (H) 70 - 99 mg/dL    Comment: Glucose reference range applies only to samples taken after fasting for at least 8 hours.  Glucose, capillary     Status: Abnormal   Collection Time: 02/07/21 11:11 AM  Result Value Ref Range   Glucose-Capillary 152 (H) 70 - 99 mg/dL    Comment: Glucose reference range applies only to samples taken after fasting for at least 8 hours.  Glucose, capillary     Status: Abnormal   Collection Time: 02/07/21  4:01 PM  Result Value Ref Range   Glucose-Capillary 188 (H) 70 - 99 mg/dL    Comment: Glucose reference range applies only to samples taken after fasting for at least 8 hours.  Glucose, capillary     Status: Abnormal   Collection Time: 02/07/21  8:04 PM  Result Value Ref Range   Glucose-Capillary 185 (H) 70 - 99 mg/dL     Comment: Glucose reference range applies only to samples taken after fasting for at least 8 hours.  Glucose, capillary     Status: Abnormal   Collection Time: 02/07/21 11:37 PM  Result Value Ref Range   Glucose-Capillary 160 (H) 70 - 99 mg/dL    Comment: Glucose reference range applies only to samples taken after fasting for at least 8 hours.  Glucose, capillary     Status: Abnormal   Collection Time: 02/08/21  4:39 AM  Result Value Ref Range   Glucose-Capillary 177 (H) 70 - 99 mg/dL    Comment: Glucose reference range applies only to samples taken after fasting for at least 8 hours.  Magnesium     Status: None   Collection Time: 02/08/21  6:30 AM  Result Value Ref Range   Magnesium 2.0 1.7 - 2.4 mg/dL    Comment: Performed at [facility], [address] [place], [place] [address]  Phosphorus     Status: None   Collection Time: 02/08/21  6:30 AM  Result Value Ref Range   Phosphorus 2.8 2.5 - 4.6 mg/dL    Comment: Performed at [facility], [address] [place], [place] [address]  CBC with Differential/Platelet     Status: Abnormal   Collection Time: 02/08/21  6:30 AM  Result Value Ref Range   WBC 24.1 (H) 4.0 - 10.5 K/uL   RBC 1.97 (L) 3.87 - 5.11 MIL/uL   Hemoglobin 7.7 (L) 12.0 - 15.0 g/dL   HCT 25.1 (L) 36.0 - 46.0 %   MCV  127.4 (H) 80.0 - 100.0 fL   MCH 39.1 (H) 26.0 - 34.0 pg   MCHC 30.7 30.0 - 36.0 g/dL   RDW 27.5 (H) 11.5 - 15.5 %   Platelets 227 150 - 400 K/uL   nRBC 0.1 0.0 - 0.2 %   Neutrophils Relative % 82 %   Neutro Abs 19.9 (H) 1.7 - 7.7 K/uL   Lymphocytes Relative 7 %   Lymphs Abs 1.6 0.7 - 4.0 K/uL   Monocytes Relative 7 %   Monocytes Absolute 1.6 (H) 0.1 - 1.0 K/uL   Eosinophils Relative 3 %   Eosinophils Absolute 0.6 (H) 0.0 - 0.5 K/uL   Basophils Relative 0 %   Basophils Absolute 0.1 0.0 - 0.1 K/uL   WBC Morphology MORPHOLOGY UNREMARKABLE    Smear Review Normal platelet morphology    Immature Granulocytes 1 %   Abs Immature  Granulocytes 0.24 (H) 0.00 - 0.07 K/uL   Polychromasia PRESENT    Basophilic Stippling PRESENT    Target Cells PRESENT    Stomatocytes PRESENT     Comment: Performed at [facility], [address] [place], [place] [address]  Comprehensive metabolic panel     Status: Abnormal   Collection Time: 02/08/21  6:30 AM  Result Value Ref Range   Sodium 145 135 - 145 mmol/L   Potassium 3.8 3.5 - 5.1 mmol/L   Chloride 104 98 - 111 mmol/L   CO2 36 (H) 22 - 32 mmol/L   Glucose, Bld 179 (H) 70 - 99 mg/dL    Comment: Glucose reference range applies only to samples taken after fasting for at least 8 hours.   BUN 29 (H) 6 - 20 mg/dL   Creatinine, Ser 0.54 0.44 - 1.00 mg/dL   Calcium 8.7 (L) 8.9 - 10.3 mg/dL   Total Protein 6.5 6.5 - 8.1 g/dL   Albumin 1.9 (L) 3.5 - 5.0 g/dL   AST 152 (H) 15 - 41 U/L   ALT 79 (H) 0 - 44 U/L   Alkaline Phosphatase 140 (H) 38 - 126 U/L   Total Bilirubin 3.1 (H) 0.3 - 1.2 mg/dL   GFR, Estimated >60 >60 mL/min    Comment: (NOTE) Calculated using the CKD-EPI Creatinine Equation (2021)    Anion gap 5 5 - 15    Comment: Performed at [facility], [address] [place], [place] [address]  Glucose, capillary     Status: Abnormal   Collection Time: 02/08/21  7:39 AM  Result Value Ref Range   Glucose-Capillary 169 (H) 70 - 99 mg/dL    Comment: Glucose reference range applies only to samples taken after fasting for at least 8 hours.  Protime-INR     Status: None   Collection Time: 02/08/21  9:42 AM  Result Value Ref Range   Prothrombin Time 15.2 11.4 - 15.2 seconds   INR 1.2 0.8 - 1.2    Comment: (NOTE) INR goal varies based on device and disease states. Performed at [facility], [address] [place], [place] [address]   APTT     Status: None   Collection Time: 02/08/21  9:42 AM  Result Value Ref Range   aPTT 33 24 - 36 seconds    Comment: Performed at [facility], [address] [place],  [address]   Glucose, capillary     Status: Abnormal   Collection Time: 02/08/21 11:29 AM  Result Value Ref Range   Glucose-Capillary 135 (H) 70 - 99 mg/dL  Comment: Glucose reference range applies only to samples taken after fasting for at least 8 hours.  Glucose, capillary     Status: Abnormal   Collection Time: 02/08/21  3:29 PM  Result Value Ref Range   Glucose-Capillary 143 (H) 70 - 99 mg/dL    Comment: Glucose reference range applies only to samples taken after fasting for at least 8 hours.    Recent Results (from the past 240 hour(s))  Body fluid culture w Gram Stain     Status: None   Collection Time: 02/04/21  2:23 PM   Specimen: PATH Cytology Peritoneal fluid  Result Value Ref Range Status   Specimen Description   Final    PERITONEAL Performed at [facility], [address] [place], [place] [address]    Special Requests   Final    NONE Performed at [facility], [address] [place], [place] [address]    Gram Stain   Final    NO SQUAMOUS EPITHELIAL CELLS SEEN NO WBC SEEN NO ORGANISMS SEEN    Culture   Final    NO GROWTH 3 DAYS Performed at [facility], [place] [address], [place], [place] [address]    Report Status 02/08/2021 FINAL  Final    Lipid Panel No results for input(s): CHOL, TRIG, HDL, CHOLHDL, VLDL, LDLCALC in the last 72 hours.  Studies/Results: CT ANGIO HEAD W OR WO CONTRAST  Result Date: 02/07/2021 CLINICAL DATA:  Intracranial hemorrhage. EXAM: CT ANGIOGRAPHY HEAD TECHNIQUE: Multidetector CT imaging of the head was performed using the standard protocol during bolus administration of intravenous contrast. Multiplanar CT image reconstructions and MIPs were obtained to evaluate the vascular anatomy. CONTRAST:  33m OMNIPAQUE IOHEXOL 350 MG/ML SOLN COMPARISON:  Head MRI 02/07/2021.  Head and neck CTA 02/01/2021. FINDINGS: Anterior circulation: The intracranial internal carotid arteries are patent with mild atherosclerotic plaque but no  evidence of a significant stenosis within limitations of motion artifact. A 6 mm aneurysm projecting medially from the left paraclinoid ICA is unchanged in size and morphology. ACAs and MCAs are patent without evidence of a proximal branch occlusion or significant proximal stenosis. No new aneurysm is identified. Posterior circulation: The intracranial vertebral arteries are patent to the basilar with detailed assessment limited by motion artifact. The basilar artery is widely patent. There are left larger than right posterior communicating arteries. Both PCAs are patent without evidence of a significant proximal stenosis. No aneurysm is identified. Venous sinuses: As permitted by contrast timing, patent. Anatomic variants: None. As seen on today's MRI, there is scattered small volume sulcal subarachnoid hemorrhage over both cerebral convexities and small volume hemorrhage in the occipital horns of the lateral ventricles. Review of the MIP images confirms the above findings. IMPRESSION: 1. 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 2. No major intracranial arterial occlusion or significant proximal stenosis. Electronically Signed   By: ALogan BoresM.D.   On: 02/07/2021 18:18   CT HEAD WO CONTRAST (5MM)  Result Date: 02/08/2021 CLINICAL DATA:  Subarachnoid hemorrhage ([facility], follow-up. Additional history provided: Patient intubated, follow-up scan. EXAM: CT HEAD WITHOUT CONTRAST TECHNIQUE: Contiguous axial images were obtained from the base of the skull through the vertex without intravenous contrast. COMPARISON:  CT angiogram head 02/07/2021.  Brain MRI 02/07/2021. FINDINGS: Brain: Persistent small-volume subarachnoid hemorrhage overlying the bilateral cerebral hemispheres, now predominantly overlying the high right frontal lobe. Redemonstrated small volume hemorrhage layering within the occipital horns of the lateral ventricles (left greater than right). No significant interval  intracranial hemorrhage is  identified. No evidence of hydrocephalus. A known subacute pontine infarct was better appreciated on the recent prior brain MRI of 02/07/2021. No acute demarcated cortical infarct is identified. No evidence of an intracranial mass. No midline shift. Vascular: No hyperdense vessel.  Atherosclerotic calcifications. Skull: No calvarial fracture. Sinuses/Orbits: Visualized orbits show no acute finding. Trace mucosal thickening within a posterior right ethmoid air cell. Other: Bilateral mastoid effusions. IMPRESSION: Persistent small-volume subarachnoid hemorrhage overlying the bilateral cerebral hemispheres, now predominantly overlying the high right frontal lobe. Redemonstrated small-volume hemorrhage layering within the occipital horns of the lateral ventricles (left greater than right). No significant interval intracranial hemorrhage is identified. No evidence of acute cortical infarct or hydrocephalus. Known subacute pontine infarct, better appreciated on the prior brain MRI. Bilateral mastoid effusions. Electronically Signed   By: Kellie Simmering D.O.   On: 02/08/2021 15:20   MR BRAIN WO CONTRAST  Result Date: 02/07/2021 CLINICAL DATA:  Stroke, follow up. EXAM: MRI HEAD WITHOUT CONTRAST TECHNIQUE: Multiplanar, multiecho pulse sequences of the brain and surrounding structures were obtained without intravenous contrast. COMPARISON:  Head MRI 01/31/2021 FINDINGS: Some sequences are severely motion degraded. Brain: A small pontine infarct is unchanged in size. Restricted diffusion in the thalami has resolved. There are multiple new small foci of susceptibility artifact, diffusion abnormality, and FLAIR hyperintensity over both cerebral convexities primarily in the frontal and parietal regions which appear predominantly sulcal in location suggesting subarachnoid hemorrhage. Some coexistent cortical petechial hemorrhage or mild cortical edema is also possible. There is a new small amount of hemorrhage layering in the  occipital horns of the lateral ventricles. No mass, midline shift, or extra-axial fluid collection is identified. The ventricles are minimally larger than on the prior MRI without overt hydrocephalus. Vascular: Major intracranial vascular flow voids are preserved. Skull and upper cervical spine: Unremarkable bone marrow signal. Sinuses/Orbits: Unremarkable orbits. Clear paranasal sinuses. Large bilateral mastoid and middle ear effusions in the setting of intubation. Other: None. IMPRESSION: 1. Motion degraded examination. 2. New scattered small volume subarachnoid hemorrhage over both cerebral convexities and small amount of intraventricular hemorrhage. 3. Unchanged size of subacute pontine infarct. 4. Resolved thalamic diffusion abnormality. These results will be called to the ordering clinician or representative by the Radiologist Assistant, and communication documented in the PACS or [employer]. Electronically Signed   By: Logan Bores M.D.   On: 02/07/2021 16:09   MR CERVICAL SPINE WO CONTRAST  Result Date: 02/07/2021 CLINICAL DATA:  Myelopathy, acute or progressive. EXAM: MRI CERVICAL SPINE WITHOUT CONTRAST TECHNIQUE: Multiplanar, multisequence MR imaging of the cervical spine was performed. No intravenous contrast was administered. COMPARISON:  CTA head and neck 02/01/2021 FINDINGS: The study is moderately motion degraded. Alignment: Straightening of the normal cervical lordosis. No listhesis. Vertebrae: No fracture, suspicious marrow lesion, or evidence of discitis. Mild multilevel chronic degenerative endplate changes. Cord: Normal signal and morphology. Posterior Fossa, vertebral arteries, paraspinal tissues: Preserved vertebral artery flow voids. Partially visualized endotracheal and enteric tubes. Partially visualized left apical lung consolidation, also present on the prior CTA. Disc levels: C2-3: Negative. C3-4: Mild disc space narrowing. Disc bulging and uncovertebral spurring result in  borderline to mild spinal stenosis and mild left neural foraminal stenosis. C4-5: Mild disc space narrowing. Disc bulging, uncovertebral spurring, and mild left facet arthrosis result in mild spinal stenosis without significant neural foraminal stenosis. C5-6: Mild disc space narrowing. Disc bulging, uncovertebral spurring, and mild left facet arthrosis result in mild spinal stenosis without significant neural foraminal stenosis.  C6-7: Mild disc bulging and uncovertebral spurring without significant stenosis. C7-T1: Negative. IMPRESSION: 1. Motion degraded examination. 2. Multilevel cervical disc degeneration with mild spinal stenosis from C3-4 to C5-6. 3. Normal appearance of the cervical spinal cord. Electronically Signed   By: Logan Bores M.D.   On: 02/07/2021 16:14   US Abdomen Limited  Result Date: 02/07/2021 CLINICAL DATA:  Abdominal distension.  Cirrhosis. EXAM: LIMITED ABDOMEN ULTRASOUND FOR ASCITES TECHNIQUE: Limited ultrasound survey for ascites was performed in all four abdominal quadrants. COMPARISON:  None. FINDINGS: A small amount of ascites is seen in all 4 abdominal quadrants. IMPRESSION: Mild ascites in all 4 abdominal quadrants. Electronically Signed   By: Marlaine Hind M.D.   On: 02/07/2021 12:46   DG Chest Port 1 View  Result Date: 02/07/2021 CLINICAL DATA:  Acute respiratory failure with hypoxia EXAM: PORTABLE CHEST 1 VIEW COMPARISON:  02/04/2021 chest radiograph. FINDINGS: Endotracheal tube tip is 2.5 cm above the carina. Enteric tube enters stomach with the tip not seen on this image. Esophageal temperature probe terminates in the midthoracic esophagus. Stable cardiomediastinal silhouette with mild cardiomegaly. No pneumothorax. No pleural effusion. Patchy hazy opacities in both lungs with streaky prominent perihilar interstitial markings, not appreciably changed. IMPRESSION: 1. Well-positioned support structures. 2. Stable mild cardiomegaly with patchy hazy opacities in both lungs and  streaky prominent perihilar interstitial markings, indeterminate for pulmonary edema versus pneumonia. Electronically Signed   By: Ilona Sorrel M.D.   On: 02/07/2021 07:11    Medications: Scheduled:  vitamin C  500 mg Per Tube BID   chlorhexidine gluconate (MEDLINE KIT)  15 mL Mouth Rinse BID   Chlorhexidine Gluconate Cloth  6 each Topical Daily   docusate  100 mg Per Tube BID   feeding supplement (PROSource TF)  45 mL Per Tube Daily   folic acid  1 mg Per Tube Daily   free water  200 mL Per Tube Q4H   influenza vac split quadrivalent PF  0.5 mL Intramuscular Tomorrow-1000   insulin aspart  0-15 Units Subcutaneous Q4H   ipratropium-albuterol  3 mL Nebulization TID   lactulose  30 g Per Tube BID   mouth rinse  15 mL Mouth Rinse 10 times per day   midodrine  5 mg Per Tube TID WC   pantoprazole sodium  40 mg Per Tube Q1200   polyethylene glycol  17 g Per Tube Daily   rifaximin  550 mg Per Tube BID   sodium chloride flush  10-40 mL Intracatheter Q12H   [START ON 02/10/2021] thiamine injection  100 mg Intravenous Daily   zinc sulfate  220 mg Per Tube Daily   Continuous:  sodium chloride Stopped (02/08/21 0444)   feeding supplement (VITAL 1.5 CAL) 60 mL/hr at 02/07/21 1600   thiamine injection 500 mg (02/08/21 1430)    Assessment: 55 yo woman with hx MDD, EtOH abuse (active c/b), liver cirrhosis, tobacco abuse, multiple previous hospital admissions for EtOH withdrawal who presented to ED 01/20/21 with hepatic encephalopathy, jaundice, and abd pain. She was admitted to floor but ultimately trasferred to ICU for unstable vital signs and hyperactive delirium c/f DTs. MRI brain 10/30 showed acute/subacute paramedian pontine ischemic infarct. Encephalopathy likely multifactorial in setting of hyperammonemia, hepatitis, aspiration PNA, acute/subacute ischemic pontine infarct.  - Exam improving in terms of cognitive function. Now with eyes spontaneously open, will gaze to the left and right on  request, will nod head to questioning and will wiggle toes to command. Still with flaccid tone x  4. Will move lower extremities with 2/5 strength to command and noxious, but with no movement of upper extremities to any stimuli. DDx for tetraparesis includes severe fatigue due to intercurrent illness, ICU neuropathy, ICU myopathy and statin myopathy versus possible central cord lesion within the cervical spine or extension of recent pontine stroke.  - Recent B12 level was normal.  - MRI brain (10/30): Acute/subacute 9 mm right paramedian nonhemorrhagic infarct in the central pons. Symmetric T2 signal changes and restricted diffusion in the thalami bilaterally. This is nonspecific, but may represent artery of [place] infarcts versus hepatic encephalopathy changes given the striking symmetry of the thalamic lesions - CTA head and neck: No acute intracranial abnormality. A 6 mm left paraclinoid ICA aneurysm. Consult to neuro intervention suggested. No intracranial large vessel occlusion or hemodynamically significant stenosis. Intracranial stenosis described on prior MR angiogram are likely artifactual. Patent major neck arteries without hemodynamically significant stenosis. - EEG (10/31): Findings suggestive of moderate to severe diffuse encephalopathy, nonspecific etiology. No seizures or epileptiform discharges were seen throughout the recording.  - Encephalopathy is multifactorial. Main contributor is hepatic encephalopathy with severe hyperammonemia. Subtle thalamic and basal ganglia diffuse signal changes on MRI are compatible with a severe metabolic encephalopathy. Other contributing factors include alcohol withdrawal, hospital delirium and infection.  - Moderate-severe alcohol withdrawal with delirium   Recommendations: - Monitor and manage her hyperammonemia and acute alcoholic hepatitis.  - Agree with thiamine and CIWA protocol. Cont thiamine to 500 mg IV TID x 3 days, then would resume 100 mg  QD - CT head today stable; STAT repeat head CT for any decline in neuro exam - q4 hr neuro checks - Inpatient seizure precautions - Telemetry - Hold ASA 78m daily for now in setting of SAH - Would not resume statin now or in the future - Vitamin E and copper levels   This patient is critically ill and at significant risk of neurological worsening, death and care requires constant monitoring of vital signs, hemodynamics,respiratory and cardiac monitoring, neurological assessment, discussion with family, other specialists and medical decision making of high complexity. I spent 40 minutes of neurocritical care time  in the care of  this patient. This was time spent independent of any time provided by nurse practitioner or PA.  CSu Monks MD [facility] [phone] If 7pm- 7am, please page neurology on call as listed in [place]

## 2021-02-08 NOTE — Progress Notes (Signed)
NAME:  [name] Carrillo, MRN:  [id number], DOB:  [birth year], LOS: 26 ADMISSION DATE:  01/20/2021, CONSULTATION DATE:  01/24/21 REFERRING MD:  Priscella Mann, CHIEF COMPLAINT:  unresponsive   History of Present Illness:  55 yo F wthi hx of MDD, Anxiety disorder, osteomyelitis of left tibia, left tibial fracture history, recurrent UTIs, EtOH abuse and liver Cirrhosis ([place]), active alcoholism, tobacco abuse with multiple readmissions to hospital with EtOH withdrawal syndrome and now being brought into MICU due to unstable vital signs and aggitated hyperactive delerium with concerns for delerium tremens. Blood work is notable for transaminitis with decreased synthetic function.     Patient had ziplock bag with two teeth in there due to loose teeth in mouth. Husband shares that patient has few false teeth that she had created herself.    Ive met with husband and son and explained that patient is unresponsive with GCS7 and is not able to protect airway. They understand the severity of situation and agree for intubation if needed. We discussed goals of care and patient is full code.   Pertinent  Medical History  MDD Anxiety Alcohol use disorder Alcoholic cirrhosis Smoking  Significant Hospital Events: Including procedures, antibiotic start and stop dates in addition to other pertinent events   10/19 Pt admitted to the [facility] unit with delirium tremors secondary to ETOH withdrawal  10/19: CT Head negative  10/19: CT Abd/Pelvis revealed Hepatomegaly with heterogeneous liver enhancement and evidence of portal venous hypertension including recanalized paraumbilical vein, small spleno-renal varices, and small volume ascites. Chronic liver disease strongly suspected although the enhancement pattern might indicate a superimposed acute hepatitis. No evidence of bile duct obstruction. No discrete liver lesion. No obstructive uropathy but absent renal contrast excretion on the delayed images suggesting acute renal  insufficiency. Punctate nephrolithiasis. Borderline to mild cardiomegaly. Bilateral lower lobe atelectasis. Mild aortic atherosclerosis.  10/24 Transfer to ICU, intubation, CVL placed 10/25: Echo revealed EF 60 to 65% 10/28: Pt with hepatic encephalopathy currently undergoing SBT 10/5.  14L positive will administer 40 mg iv lasix x1 dose  10/28: CT Head motion limited exam. Allowing for this, no acute intracranial abnormality. 10/28: CT Maxillofacial revealed prominent dental caries within the residual right maxillary molar. No associated inflammation. No other focal dental or periodontal disease to suggest infection. Minimal inflammatory changes about the lateral right orbit. No abscess. 10/31: CTA Head revealed no acute intracranial abnormality. A 6 mm left paraclinoid ICA aneurysm. Consult to neuro intervention suggested. No intracranial large vessel occlusion or hemodynamically significant stenosis. Intracranial stenosis described on prior MR angiogram are likely artifactual. Patent major neck arteries without hemodynamically significant stenosis. 10/31: MRA Head/Neck revealed medially directed 6 mm aneurysm from the distal left cavernous carotid/ophthalmic segment. Poor visualization of the right cavernous carotid, which may indicate slow flow or stenosis. Consider CTA for further evaluation. Apparent narrowing of the distal basilar artery, which may be artifactual or indicate focal stenosis. No hemodynamically significant stenosis in the neck. No large vessel occlusion. 10/31: EEG suggestive of moderate to severe diffuse encephalopathy, nonspecific etiology. No seizures or epileptiform discharges were seen throughout the recording.  11/1: Pt remains severely encephalopathic despite sedation remaining off since 10/31 '@11' :22 am  11/2: Remains encephalopathic off sedation.  ENT consulted for [facility], family wishes to proceed next week. 11/3: No change in mental status, remains encephalopathic.  Hypernatremia unchanged (150), increase Free Water flushes to 250 cc q4h, hold off on diuresis.  IR to evaluate for Paracentesis 11/5: Opens eyes to voice but not  following commands, plan to diurese today 11/6 remains intubated. MRI Brain reveals new scattered small volume subarachnoid hemorrhage over both cerebral convexities and small amount of intraventricular hemorrhage dependently within the lateral ventricles. Unchanged size of subacute pontine infarct. Resolved thalamic diffusion abnormality. MRI cervical spine does not show any spinal cord abnormality 11/7: More alert today.  Plan for repeat CTH to follow up on subarachnoid hemorrhage.  Plan for diuresis  Interim History / Subjective:  -No significant events overnight -Afebrile, hemodynamically stable, no vasopressors -Pt more awake today, opens eyes spontaneously, tracks, nods to questions -Extremely weak, intermittently able to follow commands -MRI Brain yesterday showed new scattered small volume subarachnoid hemorrhage over both cerebral convexities and small amount of intraventricular hemorrhage dependently within the lateral ventricles. Unchanged size of subacute pontine infarct. Resolved thalamic diffusion abnormality. -Neurology aware and following -Plan for repeat CT Head today @ 1500 -Creatinine stable, UOP 3 L yesterday (net+ 14L since admit) ~ will plan for diuresis with Diamox + Metolazone   Objective   Blood pressure (!) 116/49, pulse 93, temperature 99.4 F (37.4 C), temperature source Axillary, resp. rate (!) 27, height '5\' 5"'  (1.651 m), weight 95 kg, last menstrual period 09/07/2015, SpO2 95 %.    Vent Mode: PRVC FiO2 (%):  [40 %] 40 % Set Rate:  [20 bmp] 20 bmp Vt Set:  [400 mL] 400 mL PEEP:  [5 cmH20] 5 cmH20 Plateau Pressure:  [15 cmH20-17 cmH20] 17 cmH20   Intake/Output Summary (Last 24 hours) at 02/08/2021 0912 Last data filed at 02/08/2021 0500 Gross per 24 hour  Intake 821.59 ml  Output 3420 ml  Net  -2598.41 ml    Filed Weights   02/06/21 0451 02/07/21 0421 02/08/21 0500  Weight: 95.3 kg 97.4 kg 95 kg    Examination: General: acutely ill appearing female, NAD, mechanically intubated, awake and alert HENT: Atraumatic, normocephalic, supple, no JVD  Lungs: diffuse rhonchi throughout, even, non labored, overbreathes the vent Cardiovascular: Regular rate & rhythm, no R/G, 2+ radial/1+ distal pulses, 2+ generalized edema  Abdomen: +BS x4, soft, obese, abdominal distension present  Extremities: normal bulk and tone Neuro: Off sedation, awake and alert, nods to questions, intermittently follows commands but extremely weak, PERRL, icteric sclera  GU: indwelling foley catheter draining yellow urine    Resolved Hospital Problem list     Assessment & Plan:   Acute encephalopathy multifactorial secondary acute/subacute ischemic pontine infarct, hyperammonemia and infectious process  New scattered small volume subarachnoid hemorrhage over both cerebral convexities and small amount of intraventricular hemorrhage Moderate-severe alcohol withdrawal with delirium Sedation needs in setting of mechanical ventilation -Continue to hold all sedation for now to assess neurological status  -Neurology following, appreciate input~per neuro aneurysm on CTA Head/Neck and MRA Head Neck can be followed in the outpatient setting  -CIWA protocol -Continue thiamine, folate and multivitamins -Continue lactulose 30 g per tube daily and rifaximin  -Trend ammonia  -ASA discontinued -Neurology feels the subarachoid blood is most likely secondary to a coagulation disorder in the setting of the patient's liver disease. The patchy distribution does not strongly suggest an aneurysmal bleed.   -Repeat CTA Head 11/6 showed unchanged 6 mm Left Paraclinoid ICA Aneurysm  -Repeat CTH today 11/7 at 15:00  UTI due to E. coli/Klebsiella~completed course of ceftriaxone  Worsening leukocytosis secondary to questionable  pneumonia  -Monitor fever curve -Trend WBC's & Procalcitonin -Follow cultures as above -Zosyn discontinued (10/29 - 11/1) as follow up Tracheal aspirate from 10/28 with rare normal respiratory  flora -Paracentesis culture from 11/3 currently with no growth to date  Acute hypoxic respiratory failure in the setting of Hepatic Encephalopathy and suspected Pneumonia -Full vent support, implement lung protective strategies -Plateau pressures less than 30 cm H20 -Wean FiO2 & PEEP as tolerated to maintain O2 sats >92% -Follow intermittent Chest X-ray & ABG as needed -Spontaneous Breathing Trials when respiratory parameters met and mental status permits -Mental Status is currently Precluding weaning -Will need [facility] ~ ENT consulted, tentative plan for [facility] Thursday 11/10  -Implement VAP Bundle -Prn Bronchodilators -Diuresis as BP and renal function permits  Acute alcoholic hepatitis Ascites  -Monitor hepatic function panel  -S/p paracentesis on 11/3 with removal of 1.5L ~ culture with no growth to date  Hypernatremia ~ resolved -Monitor I&O's / urinary output -Follow BMP -Ensure adequate renal perfusion -Avoid nephrotoxic agents as able -Replace electrolytes as indicated -Pharmacy following for assistance with electrolyte replacement -Continue free water flushes to 250 cc q4h  Severe protein calorie malnutrition Dietitian consulted appreciate input~continue tube feeds  Macrocytic anemia due to folate deficiency Thrombocytopenia Likely driven by liver disease/alcoholism -Monitor for S/Sx of bleeding -Trend CBC -SCD's for VTE Prophylaxis (chemical prophylaxis contraindicated due to subarachnoid hemorrhage) -Transfuse for Hgb <7 -Continue folic acid supplementation    Best Practice (right click and "Reselect all SmartList Selections" daily)   Diet/type: tubefeeds DVT prophylaxis: SCD; (chemical prophylaxis contraindicated due to subarachnoid hemorrhage) GI prophylaxis:  PPI Lines: N/A Foley:  Yes and still indicated  Code Status:  full code Last date of multidisciplinary goals of care discussion [02/08/2021]  Updated pt's husband at bedside 02/08/21.  All questions answered  Critical care time: 40 minutes    Darel Hong, AGACNP-BC [facility] Prefer epic messenger for cross cover needs If after hours, please call E-link

## 2021-02-08 NOTE — Progress Notes (Signed)
Awake and responsive most of the day. Watching television and nodding to questions, Moves feet to commands. Does not move arms at all.  1500 down to CT for a repeat head CT. Placed on spontaneous on ventilator per RT. Tolerated this ventilator setting until around 1630. RT called to request ventilator setting changed back to 35% -5-20 -400. Husband in and out .Patient less alert this pm. Remains total care with foley and flexiseal for fecal containment. Perineal are has rash and sacrum also has moisture associated breakdown from multiple stools.

## 2021-02-09 DIAGNOSIS — G9341 Metabolic encephalopathy: Secondary | ICD-10-CM | POA: Diagnosis not present

## 2021-02-09 LAB — CBC WITH DIFFERENTIAL/PLATELET
Abs Immature Granulocytes: 0.21 10*3/uL — ABNORMAL HIGH (ref 0.00–0.07)
Basophils Absolute: 0.1 10*3/uL (ref 0.0–0.1)
Basophils Relative: 0 %
Eosinophils Absolute: 0.5 10*3/uL (ref 0.0–0.5)
Eosinophils Relative: 2 %
HCT: 25.7 % — ABNORMAL LOW (ref 36.0–46.0)
Hemoglobin: 7.9 g/dL — ABNORMAL LOW (ref 12.0–15.0)
Immature Granulocytes: 1 %
Lymphocytes Relative: 7 %
Lymphs Abs: 1.7 10*3/uL (ref 0.7–4.0)
MCH: 38.3 pg — ABNORMAL HIGH (ref 26.0–34.0)
MCHC: 30.7 g/dL (ref 30.0–36.0)
MCV: 124.8 fL — ABNORMAL HIGH (ref 80.0–100.0)
Monocytes Absolute: 1.7 10*3/uL — ABNORMAL HIGH (ref 0.1–1.0)
Monocytes Relative: 7 %
Neutro Abs: 18.4 10*3/uL — ABNORMAL HIGH (ref 1.7–7.7)
Neutrophils Relative %: 83 %
Platelets: 231 10*3/uL (ref 150–400)
RBC: 2.06 MIL/uL — ABNORMAL LOW (ref 3.87–5.11)
RDW: 26.7 % — ABNORMAL HIGH (ref 11.5–15.5)
Smear Review: NORMAL
WBC: 22.6 10*3/uL — ABNORMAL HIGH (ref 4.0–10.5)
nRBC: 0.1 % (ref 0.0–0.2)

## 2021-02-09 LAB — COMPREHENSIVE METABOLIC PANEL
ALT: 74 U/L — ABNORMAL HIGH (ref 0–44)
AST: 140 U/L — ABNORMAL HIGH (ref 15–41)
Albumin: 2.2 g/dL — ABNORMAL LOW (ref 3.5–5.0)
Alkaline Phosphatase: 135 U/L — ABNORMAL HIGH (ref 38–126)
Anion gap: 8 (ref 5–15)
BUN: 23 mg/dL — ABNORMAL HIGH (ref 6–20)
CO2: 32 mmol/L (ref 22–32)
Calcium: 8.7 mg/dL — ABNORMAL LOW (ref 8.9–10.3)
Chloride: 104 mmol/L (ref 98–111)
Creatinine, Ser: 0.51 mg/dL (ref 0.44–1.00)
GFR, Estimated: >60 mL/min (ref >60)
Glucose, Bld: 142 mg/dL — ABNORMAL HIGH (ref 70–99)
Potassium: 3.1 mmol/L — ABNORMAL LOW (ref 3.5–5.1)
Sodium: 144 mmol/L (ref 135–145)
Total Bilirubin: 2.8 mg/dL — ABNORMAL HIGH (ref 0.3–1.2)
Total Protein: 6.3 g/dL — ABNORMAL LOW (ref 6.5–8.1)

## 2021-02-09 LAB — PROTIME-INR
INR: 1.2 (ref 0.8–1.2)
Prothrombin Time: 15.6 seconds — ABNORMAL HIGH (ref 11.4–15.2)

## 2021-02-09 LAB — GLUCOSE, CAPILLARY
Glucose-Capillary: 157 mg/dL — ABNORMAL HIGH (ref 70–99)
Glucose-Capillary: 148 mg/dL — ABNORMAL HIGH (ref 70–99)
Glucose-Capillary: 154 mg/dL — ABNORMAL HIGH (ref 70–99)
Glucose-Capillary: 166 mg/dL — ABNORMAL HIGH (ref 70–99)
Glucose-Capillary: 209 mg/dL — ABNORMAL HIGH (ref 70–99)
Glucose-Capillary: 167 mg/dL — ABNORMAL HIGH (ref 70–99)

## 2021-02-09 LAB — MAGNESIUM: Magnesium: 1.8 mg/dL (ref 1.7–2.4)

## 2021-02-09 LAB — PHOSPHORUS: Phosphorus: 2.6 mg/dL (ref 2.5–4.6)

## 2021-02-09 LAB — APTT: aPTT: 34 seconds (ref 24–36)

## 2021-02-09 LAB — AMMONIA: Ammonia: 62 umol/L — ABNORMAL HIGH (ref 9–35)

## 2021-02-09 MED ORDER — FUROSEMIDE 10 MG/ML IJ SOLN
40.0000 mg | Freq: Once | INTRAMUSCULAR | Status: AC
  Administered 2021-02-09: 40 mg via INTRAVENOUS
  Filled 2021-02-09: qty 4

## 2021-02-09 MED ORDER — JUVEN PO PACK
1.0000 | PACK | Freq: Two times a day (BID) | ORAL | Status: DC
  Administered 2021-02-09 – 2021-02-13 (×7): 1

## 2021-02-09 MED ORDER — POTASSIUM CHLORIDE 20 MEQ PO PACK
40.0000 meq | PACK | Freq: Once | ORAL | Status: AC
  Administered 2021-02-09: 40 meq
  Filled 2021-02-09: qty 2

## 2021-02-09 NOTE — Progress Notes (Signed)
Nutrition Follow Up Note   DOCUMENTATION CODES:   Not applicable  INTERVENTION:   Continue Vital 1.5 @ 47m/hr + Pro-Source 481mdaily via tube  Free water flushes 20070m4 hours   Regimen provides 2200kcal/day, 108g/day protein and 2300m3my free water   Add Juven Fruit Punch BID via tube, each serving provides 95kcal and 2.5g of protein (amino acids glutamine and arginine)  NUTRITION DIAGNOSIS:   Inadequate oral intake related to inability to eat (pt sedated and ventilated) as evidenced by NPO status.  GOAL:   Provide needs based on ASPEN/SCCM guidelines -met with tube feeds   MONITOR:   Vent status, Labs, Weight trends, Skin, I & O's, TF tolerance  ASSESSMENT:   55 y64 female with h/o etoh abuse, anxiety, depression and cirrhosis who is admitted with encephalopathy.  Pt remains sedated and ventilated. OGT in place. Pt tolerating tube feeds well at goal rate. Plan is for tracheostomy 11/10. RN will place NGT for continued nutrition support. Per chart, pt is up ~34lbs since admission but appears fairly weight stable over the past week. Pt +14.3L on her I & Os. Pt with new MASD and skin breakdown; will add Juven.   Medications reviewed and include: vitamin C, aspirin, colace, folic acid, insulin, lactulose, thiamine, zinc  Labs reviewed: Na 144 wnl, K 3.1(L), BUN 23(H), P 2.6 wnl, Mg 1.8 wnl Wbc- 22.6(H), Hgb 7.9(L), Hct 25.7(L) Cbgs- 157, 148, 154 x 24 hrs  Patient is currently intubated on ventilator support MV: 9.3 L/min Temp (24hrs), Avg:98.5 F (36.9 C), Min:98.1 F (36.7 C), Max:99.2 F (37.3 C)  Propofol: none   MAP- >65mm45mUOP- 800ml 36met Order:   Diet Order             Diet NPO time specified  Diet effective now                  EDUCATION NEEDS:   No education needs have been identified at this time  Skin:  Skin Assessment: Reviewed RN Assessment (ecchymosis, perineal rash, MASD sacrum)  Last BM:  11/8- type 7  Height:   Ht  Readings from Last 1 Encounters:  01/19/21 _0  (1.651 m)    Weight:   Wt Readings from Last 1 Encounters:  02/08/21 95 kg    Ideal Body Weight:  56.8 kg  BMI:  Body mass index is 34.85 kg/m.  Estimated Nutritional Needs:   Kcal:  1800-2100kcal/day  Protein:  > 110 grams  Fluid:  1.7-2.0L/day  Maddox Bratcher Koleen DistanceD, LDN Please refer to [facility] and/or RD on-call/weekend/after hours pager

## 2021-02-09 NOTE — Progress Notes (Addendum)
Alert most of the day. Slept at short intervals. Husband in and out. Wiggled toes to command but still no movement of upper arms. Pushed rectal foley out. Reinserted without issues. Output good from Lasix 40 mgs.approximately 500 ml of urine.

## 2021-02-09 NOTE — Progress Notes (Signed)
NAME:  [name] Carrillo, MRN:  [id number], DOB:  [birth year], LOS: 67 ADMISSION DATE:  01/20/2021, CONSULTATION DATE:  01/24/21 REFERRING MD:  Priscella Mann, CHIEF COMPLAINT:  unresponsive   History of Present Illness:  55 yo F wthi hx of MDD, Anxiety disorder, osteomyelitis of left tibia, left tibial fracture history, recurrent UTIs, EtOH abuse and liver Cirrhosis ([place]), active alcoholism, tobacco abuse with multiple readmissions to hospital with EtOH withdrawal syndrome and now being brought into MICU due to unstable vital signs and aggitated hyperactive delerium with concerns for delerium tremens. Blood work is notable for transaminitis with decreased synthetic function.     Patient had ziplock bag with two teeth in there due to loose teeth in mouth. Husband shares that patient has few false teeth that she had created herself.    Ive met with husband and son and explained that patient is unresponsive with GCS7 and is not able to protect airway. They understand the severity of situation and agree for intubation if needed. We discussed goals of care and patient is full code.   Pertinent  Medical History  MDD Anxiety Alcohol use disorder Alcoholic cirrhosis Smoking  Significant Hospital Events: Including procedures, antibiotic start and stop dates in addition to other pertinent events   10/19 Pt admitted to the [facility] unit with delirium tremors secondary to ETOH withdrawal  10/19: CT Head negative  10/19: CT Abd/Pelvis revealed Hepatomegaly with heterogeneous liver enhancement and evidence of portal venous hypertension including recanalized paraumbilical vein, small spleno-renal varices, and small volume ascites. Chronic liver disease strongly suspected although the enhancement pattern might indicate a superimposed acute hepatitis. No evidence of bile duct obstruction. No discrete liver lesion. No obstructive uropathy but absent renal contrast excretion on the delayed images suggesting acute renal  insufficiency. Punctate nephrolithiasis. Borderline to mild cardiomegaly. Bilateral lower lobe atelectasis. Mild aortic atherosclerosis.  10/24 Transfer to ICU, intubation, CVL placed 10/25: Echo revealed EF 60 to 65% 10/28: Pt with hepatic encephalopathy currently undergoing SBT 10/5.  14L positive will administer 40 mg iv lasix x1 dose  10/28: CT Head motion limited exam. Allowing for this, no acute intracranial abnormality. 10/28: CT Maxillofacial revealed prominent dental caries within the residual right maxillary molar. No associated inflammation. No other focal dental or periodontal disease to suggest infection. Minimal inflammatory changes about the lateral right orbit. No abscess. 10/31: CTA Head revealed no acute intracranial abnormality. A 6 mm left paraclinoid ICA aneurysm. Consult to neuro intervention suggested. No intracranial large vessel occlusion or hemodynamically significant stenosis. Intracranial stenosis described on prior MR angiogram are likely artifactual. Patent major neck arteries without hemodynamically significant stenosis. 10/31: MRA Head/Neck revealed medially directed 6 mm aneurysm from the distal left cavernous carotid/ophthalmic segment. Poor visualization of the right cavernous carotid, which may indicate slow flow or stenosis. Consider CTA for further evaluation. Apparent narrowing of the distal basilar artery, which may be artifactual or indicate focal stenosis. No hemodynamically significant stenosis in the neck. No large vessel occlusion. 10/31: EEG suggestive of moderate to severe diffuse encephalopathy, nonspecific etiology. No seizures or epileptiform discharges were seen throughout the recording.  11/1: Pt remains severely encephalopathic despite sedation remaining off since 10/31 _0 :22 am  11/2: Remains encephalopathic off sedation.  ENT consulted for [facility], family wishes to proceed next week. 11/3: No change in mental status, remains encephalopathic.  Hypernatremia unchanged (150), increase Free Water flushes to 250 cc q4h, hold off on diuresis.  IR to evaluate for Paracentesis 11/5: Opens eyes to voice but not  following commands, plan to diurese today 11/6 remains intubated. MRI Brain reveals new scattered small volume subarachnoid hemorrhage over both cerebral convexities and small amount of intraventricular hemorrhage dependently within the lateral ventricles. Unchanged size of subacute pontine infarct. Resolved thalamic diffusion abnormality. MRI cervical spine does not show any spinal cord abnormality 11/7: More alert today.  Plan for repeat CTH to follow up on subarachnoid hemorrhage.  Plan for diuresis 11/8: Neuro status unchanged. Attempt to exercise respiratory muscles spontaneous mode.  Still remains too weak to protect her airway.  Plan for diuresis  Interim History / Subjective:  -No significant events overnight -Afebrile, hemodynamically stable, no vasopressors -Neuro status unchanged, opens eyes spontaneously, tracks, nods to questions -Plan to exercise respiratory muscles in spontaneous mode as tolerated -Extremely weak ~ too weak to protect her airway ~ will continue to plan for [facility] on Thursday 11/10 -intermittently able to follow commands -Repeat CTH yesterday showed overall stability of small subarachnoid hemorrhage -Creatinine stable, UOP 780 cc yesterday (net+ 15.6L since admit) ~ will plan for diuresis with Lasix   Objective   Blood pressure (!) 113/50, pulse 96, temperature 99.2 F (37.3 C), temperature source Axillary, resp. rate (!) 27, height _0  (1.651 m), weight 95 kg, last menstrual period 09/07/2015, SpO2 94 %.    Vent Mode: PSV FiO2 (%):  [35 %] 35 % Set Rate:  [20 bmp] 20 bmp Vt Set:  [400 mL] 400 mL PEEP:  [5 cmH20] 5 cmH20 Pressure Support:  [5 cmH20-10 cmH20] 5 cmH20   Intake/Output Summary (Last 24 hours) at 02/09/2021 0846 Last data filed at 02/09/2021 0600 Gross per 24 hour  Intake 4819.93 ml   Output 1280 ml  Net 3539.93 ml    Filed Weights   02/06/21 0451 02/07/21 0421 02/08/21 0500  Weight: 95.3 kg 97.4 kg 95 kg    Examination: General: acutely ill appearing female, NAD, mechanically intubated, awake and alert HENT: Atraumatic, normocephalic, supple, no JVD  Lungs: diffuse rhonchi throughout, even, non labored, in PS mode on the vent Cardiovascular: Regular rate & rhythm, no R/G, 2+ radial/1+ distal pulses, 2+ generalized edema  Abdomen: +BS x4, soft, obese, abdominal distension present  Extremities: normal bulk and tone, severe generalized weakness Neuro: Off sedation, awake and alert, nods to questions, intermittently follows commands but extremely weak, PERRL, icteric sclera  GU: indwelling foley catheter draining yellow urine    Resolved Hospital Problem list     Assessment & Plan:   Acute encephalopathy multifactorial secondary acute/subacute ischemic pontine infarct, hyperammonemia and infectious process  New scattered small volume subarachnoid hemorrhage over both cerebral convexities and small amount of intraventricular hemorrhage Moderate-severe alcohol withdrawal with delirium Sedation needs in setting of mechanical ventilation -Continue to hold all sedation for now to assess neurological status  -Neurology following, appreciate input~per neuro aneurysm on CTA Head/Neck and MRA Head Neck can be followed in the outpatient setting  -CIWA protocol -Continue thiamine, folate and multivitamins -Continue lactulose 30 g per tube daily and rifaximin  -Trend ammonia  -ASA/anticoagulation discontinued -Neurology feels the subarachoid blood is most likely secondary to a coagulation disorder in the setting of the patient's liver disease. The patchy distribution does not strongly suggest an aneurysmal bleed.   -Repeat CTA Head 11/6 showed unchanged 6 mm Left Paraclinoid ICA Aneurysm  -Repeat CTH 11/7 showed overall stability of subarachnoid hemorrhage  -Plan for  repeat CTH on 11/9  UTI due to E. coli/Klebsiella~completed course of ceftriaxone  Worsening leukocytosis secondary to questionable pneumonia ~  treated -Monitor fever curve -Trend WBC's & Procalcitonin -Follow cultures as above -Zosyn discontinued (10/29 - 11/1) as follow up Tracheal aspirate from 10/28 with rare normal respiratory flora -Paracentesis culture from 11/3 currently with no growth to date  Acute hypoxic respiratory failure in the setting of Hepatic Encephalopathy and suspected Pneumonia -Full vent support, implement lung protective strategies -Plateau pressures less than 30 cm H20 -Wean FiO2 & PEEP as tolerated to maintain O2 sats >92% -Follow intermittent Chest X-ray & ABG as needed -Spontaneous Breathing Trials when respiratory parameters met and mental status permits -Mental Status and severe weakness is currently Precluding weaning -Will need [facility] ~ ENT consulted, tentative plan for [facility] Thursday 11/10  -Implement VAP Bundle -Prn Bronchodilators -Diuresis as BP and renal function permits ~ will give 40 mg IV Lasix 67/0  Acute alcoholic hepatitis Ascites  -Monitor hepatic function panel  -S/p paracentesis on 11/3 with removal of 1.5L ~ culture with no growth   Hypernatremia ~ resolved Hypokalemia -Monitor I&O's / urinary output -Follow BMP -Ensure adequate renal perfusion -Avoid nephrotoxic agents as able -Replace electrolytes as indicated -Pharmacy following for assistance with electrolyte replacement -Continue free water flushes to 200 cc q4h  Severe protein calorie malnutrition Dietitian consulted appreciate input~continue tube feeds  Macrocytic anemia due to folate deficiency Thrombocytopenia Likely driven by liver disease/alcoholism -Monitor for S/Sx of bleeding -Trend CBC -SCD's for VTE Prophylaxis (chemical prophylaxis contraindicated due to subarachnoid hemorrhage) -Transfuse for Hgb <7 -Continue folic acid supplementation    Best Practice  (right click and "Reselect all SmartList Selections" daily)   Diet/type: tubefeeds DVT prophylaxis: SCD; (chemical prophylaxis contraindicated due to subarachnoid hemorrhage) GI prophylaxis: PPI Lines: N/A Foley:  Yes and still indicated  Code Status:  full code Last date of multidisciplinary goals of care discussion [11/87/2022]  Updated pt's husband at bedside.  All questions answered  Critical care time: 38 minutes    Darel Hong, AGACNP-BC [facility] Prefer epic messenger for cross cover needs If after hours, please call E-link

## 2021-02-09 NOTE — Plan of Care (Signed)
Neurology plan of care  Per primary team, neurologic exam unchanged. No studies pending at this time. [facility] Mayo tentatively planned for 11/10 as patient can still not protect airway. Will re-examine tomorrow.  Bing Neighbors, MD [facility] [phone]  If 7pm- 7am, please page neurology on call as listed in AMION.

## 2021-02-09 NOTE — TOC Progression Note (Signed)
Transition of Care [facility]) - Progression Note    Patient Details  Name: [name] Carrillo MRN: [id number] Date of Birth: [birth year]  Transition of Care [facility]) CM/SW Contact  Joseph Art, [place] Phone Number: 02/09/2021, 3:34 PM  Clinical Narrative:     Patient remains critically ill, neuro status unchanged.   Patient remains too weak to protect her airway.  Plan for diuresis. Trach scheduled for 02/11/2021.  Expected Discharge Plan: IP Rehab Facility Barriers to Discharge: Continued Medical Work up  Expected Discharge Plan and Services Expected Discharge Plan: IP Rehab Facility     Post Acute Care Choice: NA Living arrangements for the past 2 months: Single Family Home                                       Social Determinants of Health (SDOH) Interventions    Readmission Risk Interventions Readmission Risk Prevention Plan 02/05/2021  Transportation Screening Complete  PCP or Specialist Appt within 5-7 Days Not Complete  Not Complete comments NMS for Discharge  Home Care Screening Complete  Medication Review (RN CM) Complete  Some recent data might be hidden

## 2021-02-10 ENCOUNTER — Inpatient Hospital Stay: Payer: BC Managed Care – PPO

## 2021-02-10 DIAGNOSIS — I609 Nontraumatic subarachnoid hemorrhage, unspecified: Secondary | ICD-10-CM

## 2021-02-10 DIAGNOSIS — G9341 Metabolic encephalopathy: Secondary | ICD-10-CM | POA: Diagnosis not present

## 2021-02-10 LAB — CBC WITH DIFFERENTIAL/PLATELET
Abs Immature Granulocytes: 0.18 10*3/uL — ABNORMAL HIGH (ref 0.00–0.07)
Basophils Absolute: 0.1 10*3/uL (ref 0.0–0.1)
Basophils Relative: 0 %
Eosinophils Absolute: 0.5 10*3/uL (ref 0.0–0.5)
Eosinophils Relative: 3 %
HCT: 25.9 % — ABNORMAL LOW (ref 36.0–46.0)
Hemoglobin: 7.9 g/dL — ABNORMAL LOW (ref 12.0–15.0)
Immature Granulocytes: 1 %
Lymphocytes Relative: 8 %
Lymphs Abs: 1.6 10*3/uL (ref 0.7–4.0)
MCH: 37.8 pg — ABNORMAL HIGH (ref 26.0–34.0)
MCHC: 30.5 g/dL (ref 30.0–36.0)
MCV: 123.9 fL — ABNORMAL HIGH (ref 80.0–100.0)
Monocytes Absolute: 1.7 10*3/uL — ABNORMAL HIGH (ref 0.1–1.0)
Monocytes Relative: 9 %
Neutro Abs: 16.4 10*3/uL — ABNORMAL HIGH (ref 1.7–7.7)
Neutrophils Relative %: 79 %
Platelets: 218 10*3/uL (ref 150–400)
RBC: 2.09 MIL/uL — ABNORMAL LOW (ref 3.87–5.11)
RDW: 26.3 % — ABNORMAL HIGH (ref 11.5–15.5)
Smear Review: NORMAL
WBC: 20.5 10*3/uL — ABNORMAL HIGH (ref 4.0–10.5)
nRBC: 0 % (ref 0.0–0.2)

## 2021-02-10 LAB — COMPREHENSIVE METABOLIC PANEL
ALT: 68 U/L — ABNORMAL HIGH (ref 0–44)
AST: 127 U/L — ABNORMAL HIGH (ref 15–41)
Albumin: 2.1 g/dL — ABNORMAL LOW (ref 3.5–5.0)
Alkaline Phosphatase: 145 U/L — ABNORMAL HIGH (ref 38–126)
Anion gap: 6 (ref 5–15)
BUN: 27 mg/dL — ABNORMAL HIGH (ref 6–20)
CO2: 34 mmol/L — ABNORMAL HIGH (ref 22–32)
Calcium: 8.7 mg/dL — ABNORMAL LOW (ref 8.9–10.3)
Chloride: 103 mmol/L (ref 98–111)
Creatinine, Ser: 0.5 mg/dL (ref 0.44–1.00)
GFR, Estimated: >60 mL/min (ref >60)
Glucose, Bld: 166 mg/dL — ABNORMAL HIGH (ref 70–99)
Potassium: 3.1 mmol/L — ABNORMAL LOW (ref 3.5–5.1)
Sodium: 143 mmol/L (ref 135–145)
Total Bilirubin: 2.6 mg/dL — ABNORMAL HIGH (ref 0.3–1.2)
Total Protein: 6.6 g/dL (ref 6.5–8.1)

## 2021-02-10 LAB — GLUCOSE, CAPILLARY
Glucose-Capillary: 149 mg/dL — ABNORMAL HIGH (ref 70–99)
Glucose-Capillary: 155 mg/dL — ABNORMAL HIGH (ref 70–99)
Glucose-Capillary: 157 mg/dL — ABNORMAL HIGH (ref 70–99)
Glucose-Capillary: 163 mg/dL — ABNORMAL HIGH (ref 70–99)
Glucose-Capillary: 163 mg/dL — ABNORMAL HIGH (ref 70–99)
Glucose-Capillary: 188 mg/dL — ABNORMAL HIGH (ref 70–99)

## 2021-02-10 LAB — PROTIME-INR
INR: 1.3 — ABNORMAL HIGH (ref 0.8–1.2)
Prothrombin Time: 15.8 seconds — ABNORMAL HIGH (ref 11.4–15.2)

## 2021-02-10 LAB — APTT: aPTT: 33 seconds (ref 24–36)

## 2021-02-10 LAB — COPPER, SERUM: Copper: 96 ug/dL (ref 80–158)

## 2021-02-10 LAB — PHOSPHORUS: Phosphorus: 3 mg/dL (ref 2.5–4.6)

## 2021-02-10 LAB — MAGNESIUM: Magnesium: 1.8 mg/dL (ref 1.7–2.4)

## 2021-02-10 IMAGING — CT CT HEAD W/O CM
4 series · 17 of 47 positions shown, 19 images · non-contrast
Comparison: [DATE]

CLINICAL DATA: Stroke, follow up

EXAM:
CT HEAD WITHOUT CONTRAST
TECHNIQUE: Contiguous axial images were obtained from the base of the skull
through the vertex without intravenous contrast.

[Series 2: head wo · axial · 0.46mm/px · z∈[+91,+216]mm · 7 of 35 slices shown, 9 images]
[im 5/35  brain]
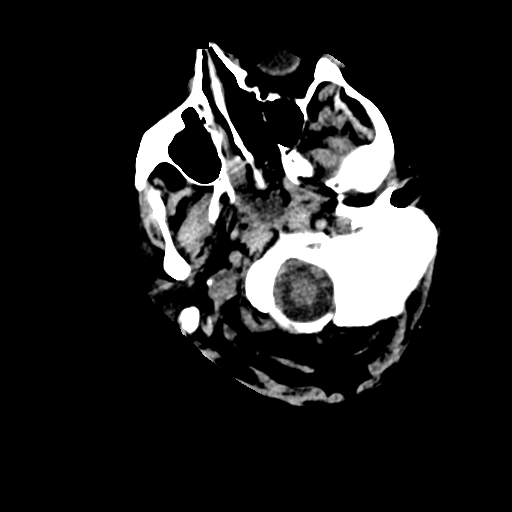
[im 5/35  bone]
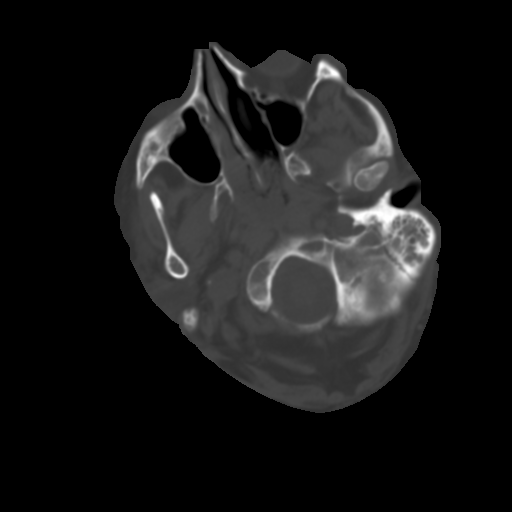
[im 9/35  brain]
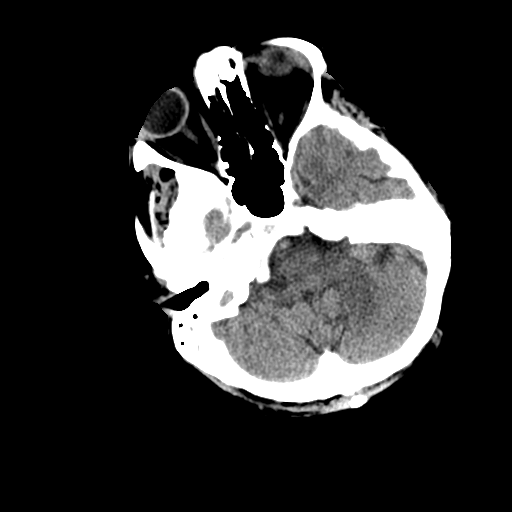
[im 13/35  brain]
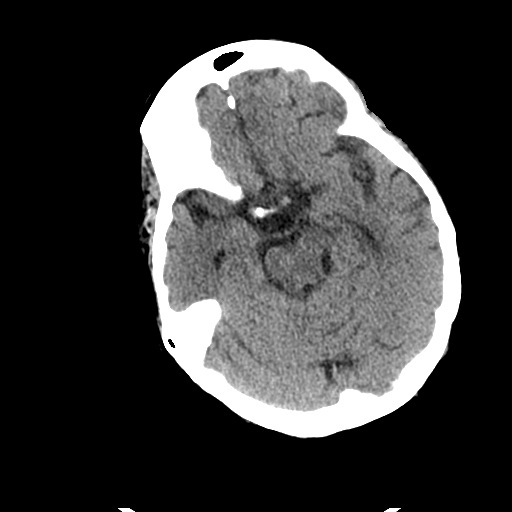
[im 18/35  brain]
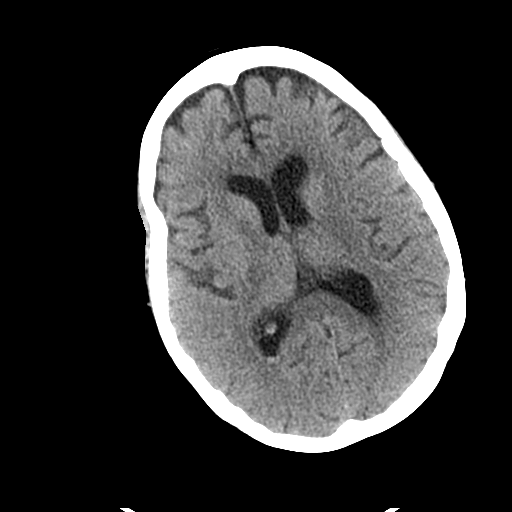
[im 22/35  brain]
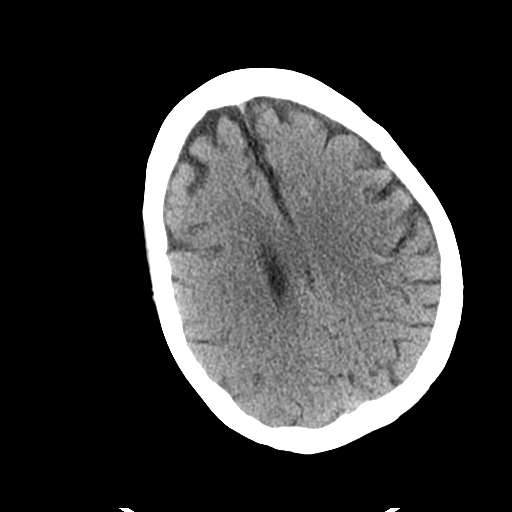
[im 22/35  bone]
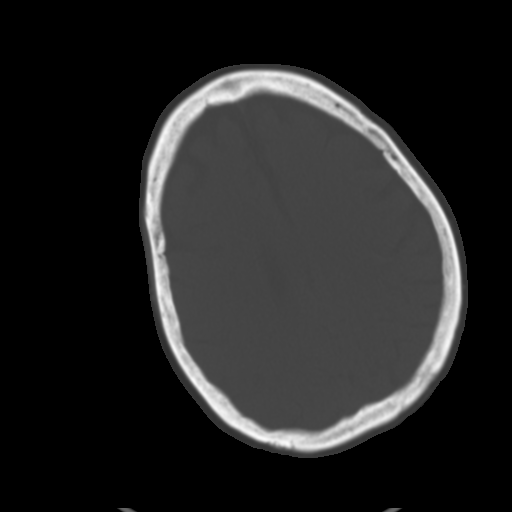
[im 26/35  brain]
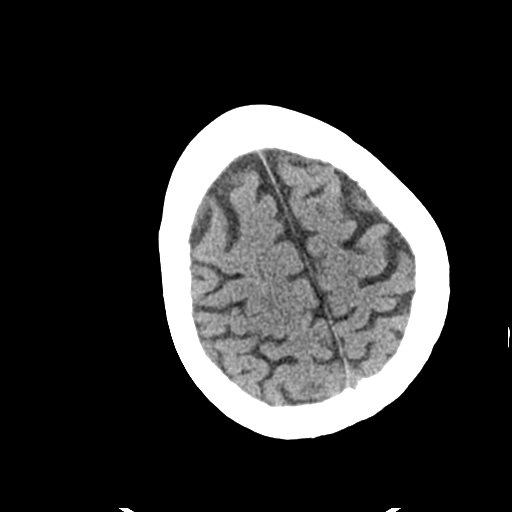
[im 30/35  brain]
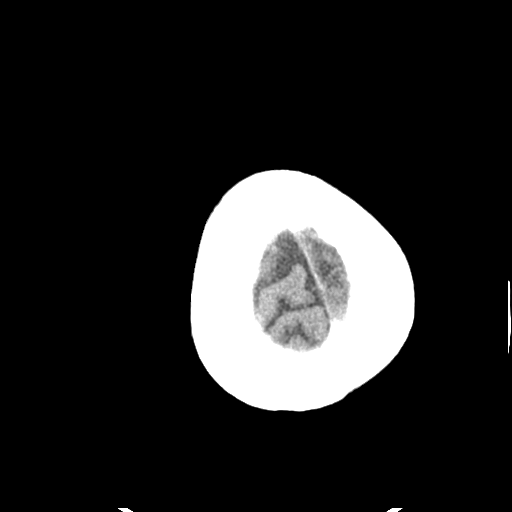

[Series 3: head bone · axial · 0.46mm/px · z∈[+87,+149]mm · 4 of 88 slices shown]
[im 9/88  bone]
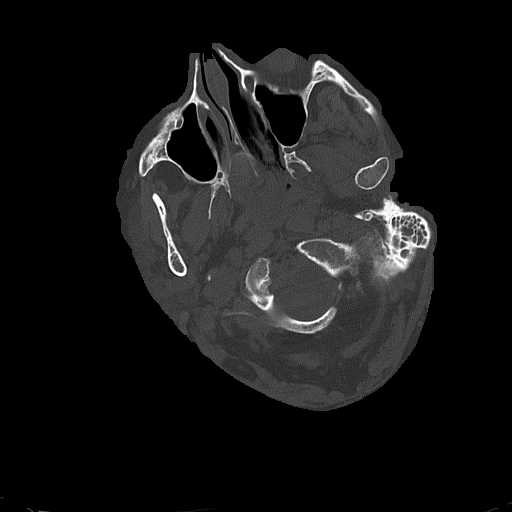
[im 18/88  bone]
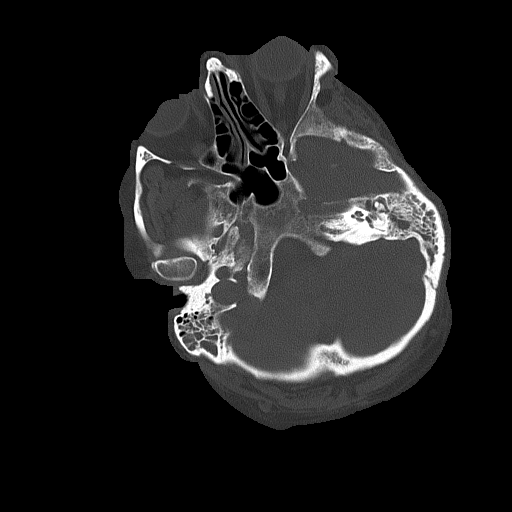
[im 27/88  bone]
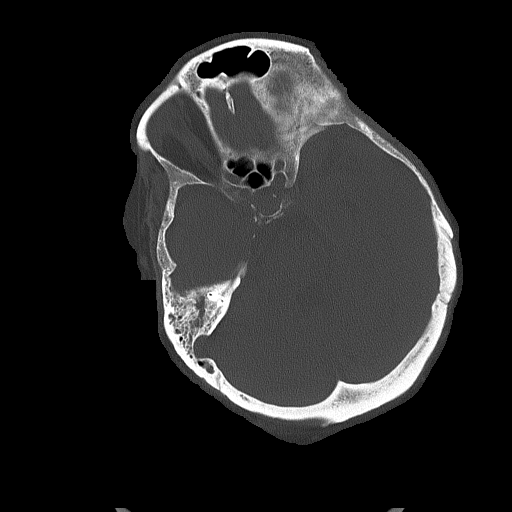
[im 40/88  bone]
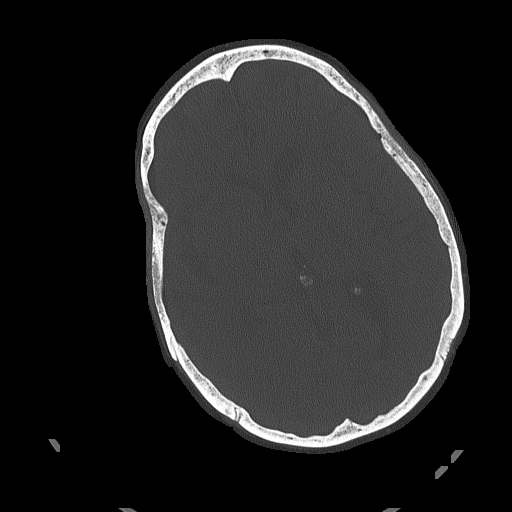

[Series 4: coronal soft tissue · coronal · 0.33mm/px · 3 of 68 slices shown]
[im 23/68  brain]
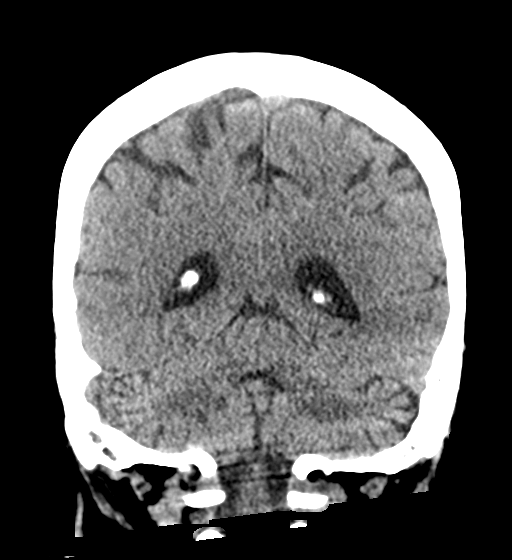
[im 30/68  brain]
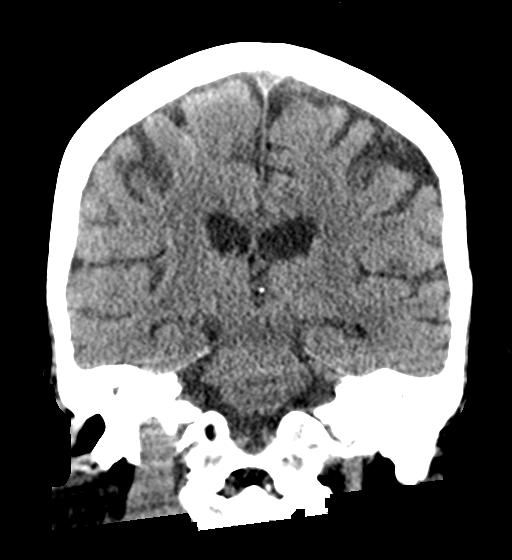
[im 38/68  brain]
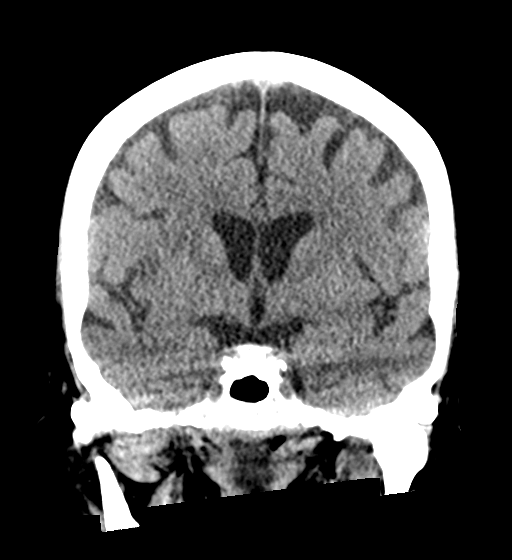

[Series 5: sagittal soft tissue · sagittal · 0.37mm/px · 3 of 57 slices shown]
[im 19/57  brain]
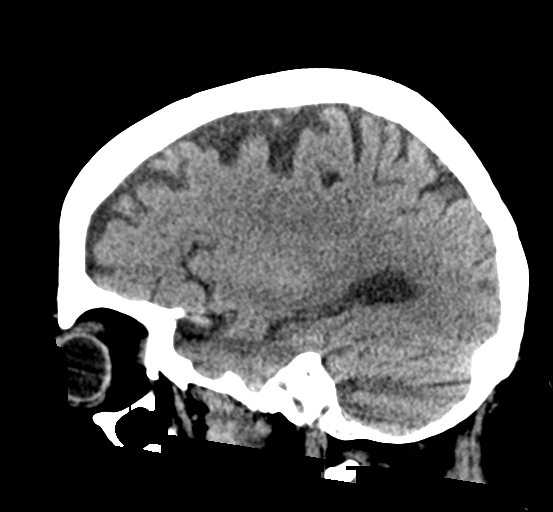
[im 29/57  brain]
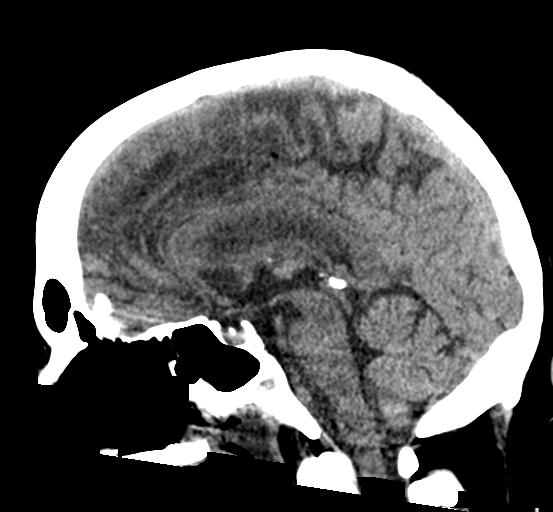
[im 38/57  brain]
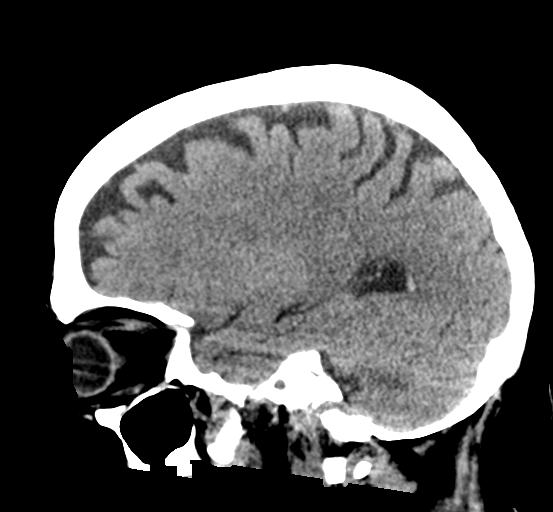

[17 of 47 positions shown; findings below may reference images not displayed]

FINDINGS: Brain: Persistent small volume layering hemorrhage in the occipital
horns. Similar minimal sulcal subarachnoid hemorrhage in the right
frontal region. No new hemorrhage. No new loss of gray-white
differentiation. Ventricles are stable in size.

Vascular: No new finding.

Skull: Calvarium is unremarkable.

Sinuses/Orbits: No acute finding.

Other: Bilateral mastoid and middle ear opacification.
IMPRESSION: Similar residual subarachnoid hemorrhage.  No new abnormality.

## 2021-02-10 IMAGING — DX DG CHEST 1V PORT
1 series · 1 of 1 positions shown · non-contrast
Comparison: [DATE]

CLINICAL DATA: Respiratory failure, hypoxia

EXAM:
PORTABLE CHEST 1 VIEW

[chest ap]
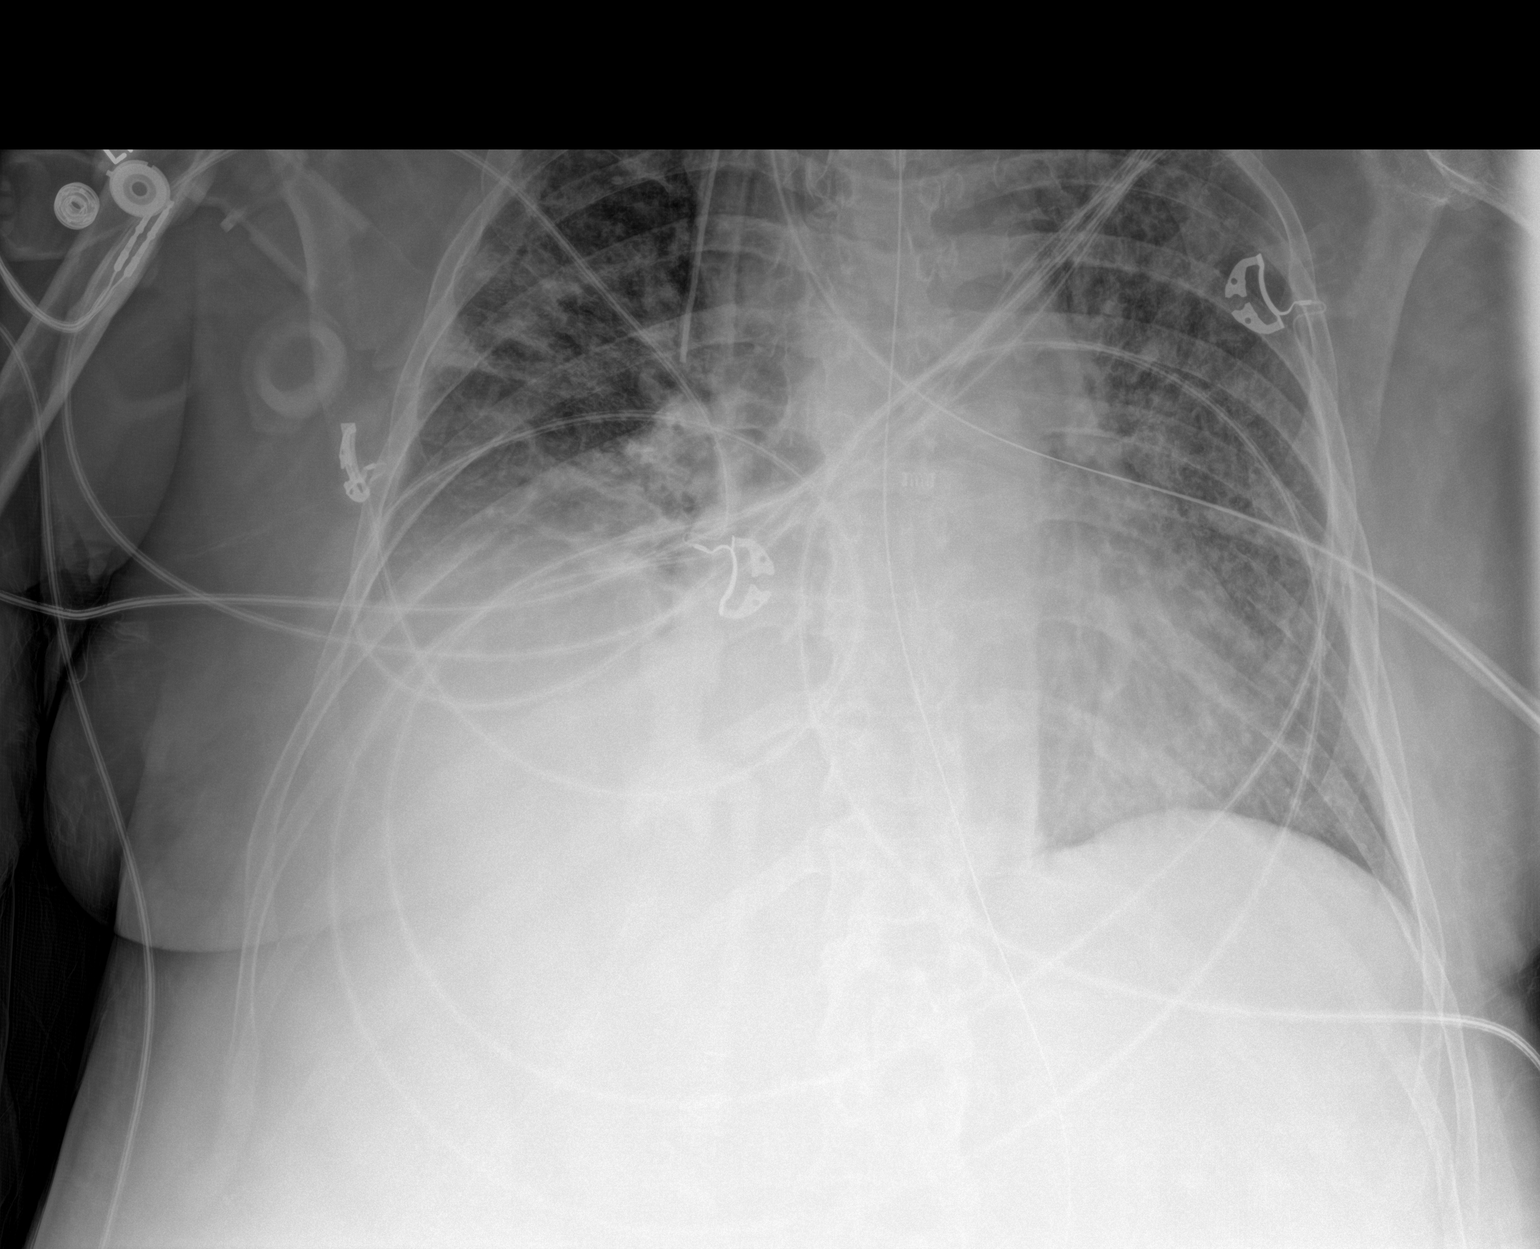

[1 of 1 positions shown; findings below may reference images not displayed]

FINDINGS: Interval increase in a layering right pleural effusion, now
moderate. Similar diffuse bilateral interstitial opacity.
Cardiomegaly. Support apparatus is unchanged, including endotracheal
tube and esophagogastric tube.
IMPRESSION: 1. Interval increase in a layering right pleural effusion, now
moderate.
2. Similar diffuse bilateral interstitial opacity, likely edema.
3. Cardiomegaly.
4. Unchanged support apparatus.

## 2021-02-10 MED ORDER — POTASSIUM CHLORIDE 20 MEQ PO PACK
40.0000 meq | PACK | ORAL | Status: AC
  Administered 2021-02-10 (×2): 40 meq
  Filled 2021-02-10 (×2): qty 2

## 2021-02-10 NOTE — Progress Notes (Signed)
Neurology progress note  Subjective: Awake, follows simple commands axially (look left, look right) but no movement in extremities to command.   Objective: Current vital signs: BP (!) 119/55   Pulse 97   Temp 99.7 F (37.6 C) (Axillary)   Resp (!) 32   Ht _0  (1.651 m)   Wt 95 kg   LMP 09/07/2015   SpO2 94%   BMI 34.85 kg/m  Vital signs in last 24 hours: Temp:  [98.5 F (36.9 C)-100.1 F (37.8 C)] 99.7 F (37.6 C) (11/09 1551) Pulse Rate:  [85-102] 97 (11/09 1700) Resp:  [18-37] 32 (11/09 1700) BP: (97-127)/(46-87) 119/55 (11/09 1700) SpO2:  [92 %-97 %] 94 % (11/09 1700) FiO2 (%):  [35 %] 35 % (11/09 1331)  Intake/Output from previous day: 11/08 0701 - 11/09 0700 In: 1550 [I.V.:30; NG/GT:1320; IV Piggyback:150] Out: 4599 [Urine:4185; Stool:900] Intake/Output this shift: Total I/O In: 1516 [I.V.:10; NG/GT:1506] Out: 475 [Urine:475] Nutritional status:  Diet Order             Diet NPO time specified  Diet effective now                  HEENT: [place]/AT. Scleral edema with icterus.  Lungs: Intubated Ext: No cyanosis or pallor. Diffuse limb edema.    Neurologic Exam: Ment: Eyes are open and follows simple commands. Weakly nods yes or no in response to simple questions. CN: PERRL and sluggish. Eyes are conjugate; will gaze slowly to left and then to right when asked. No nystagmus. Face symmetric.  Motor/Sensory: Flaccid tone in BUE with no movement proximally or distally to noxious or to repeated requests to wiggle fingers. This is noted in the context of diffuse limb edema.  BLE with flaccid tone. Will weakly withdraw with 2/5 strength to plantar stimulation. Will weakly wiggle toes on right and left foot when asked.  Reflexes: Absent brachioradialis, biceps, patellar and achilles reflexes. Toes mute bilaterally    Lab Results: Results for orders placed or performed during the hospital encounter of 01/20/21 (from the past 48 hour(s))  Glucose, capillary      Status: Abnormal   Collection Time: 02/08/21  7:36 PM  Result Value Ref Range   Glucose-Capillary 157 (H) 70 - 99 mg/dL    Comment: Glucose reference range applies only to samples taken after fasting for at least 8 hours.   Comment 1 Notify RN    Comment 2 Document in Chart   Glucose, capillary     Status: Abnormal   Collection Time: 02/08/21 11:08 PM  Result Value Ref Range   Glucose-Capillary 154 (H) 70 - 99 mg/dL    Comment: Glucose reference range applies only to samples taken after fasting for at least 8 hours.   Comment 1 Notify RN    Comment 2 Document in Chart   Glucose, capillary     Status: Abnormal   Collection Time: 02/09/21  3:46 AM  Result Value Ref Range   Glucose-Capillary 157 (H) 70 - 99 mg/dL    Comment: Glucose reference range applies only to samples taken after fasting for at least 8 hours.   Comment 1 Notify RN    Comment 2 Document in Chart   Magnesium     Status: None   Collection Time: 02/09/21  5:19 AM  Result Value Ref Range   Magnesium 1.8 1.7 - 2.4 mg/dL    Comment: Performed at [facility], [address], [place], [place] [address]  Phosphorus  Status: None   Collection Time: 02/09/21  5:19 AM  Result Value Ref Range   Phosphorus 2.6 2.5 - 4.6 mg/dL    Comment: Performed at [facility], [address] [place], [place] [address]  CBC with Differential/Platelet     Status: Abnormal   Collection Time: 02/09/21  5:19 AM  Result Value Ref Range   WBC 22.6 (H) 4.0 - 10.5 K/uL   RBC 2.06 (L) 3.87 - 5.11 MIL/uL   Hemoglobin 7.9 (L) 12.0 - 15.0 g/dL   HCT 25.7 (L) 36.0 - 46.0 %   MCV 124.8 (H) 80.0 - 100.0 fL   MCH 38.3 (H) 26.0 - 34.0 pg   MCHC 30.7 30.0 - 36.0 g/dL   RDW 26.7 (H) 11.5 - 15.5 %   Platelets 231 150 - 400 K/uL   nRBC 0.1 0.0 - 0.2 %   Neutrophils Relative % 83 %   Neutro Abs 18.4 (H) 1.7 - 7.7 K/uL   Lymphocytes Relative 7 %   Lymphs Abs 1.7 0.7 - 4.0 K/uL   Monocytes Relative 7 %   Monocytes Absolute 1.7  (H) 0.1 - 1.0 K/uL   Eosinophils Relative 2 %   Eosinophils Absolute 0.5 0.0 - 0.5 K/uL   Basophils Relative 0 %   Basophils Absolute 0.1 0.0 - 0.1 K/uL   WBC Morphology MORPHOLOGY UNREMARKABLE    Smear Review Normal platelet morphology    Immature Granulocytes 1 %   Abs Immature Granulocytes 0.21 (H) 0.00 - 0.07 K/uL   Ovalocytes PRESENT    Stomatocytes PRESENT     Comment: Performed at [facility], [address] [place], [place] [address]  Ammonia     Status: Abnormal   Collection Time: 02/09/21  5:19 AM  Result Value Ref Range   Ammonia 62 (H) 9 - 35 umol/L    Comment: Performed at [facility], [address] [place], [place] [address]  Protime-INR     Status: Abnormal   Collection Time: 02/09/21  5:19 AM  Result Value Ref Range   Prothrombin Time 15.6 (H) 11.4 - 15.2 seconds   INR 1.2 0.8 - 1.2    Comment: (NOTE) INR goal varies based on device and disease states. Performed at [facility], [address] [place], [place] [address]   APTT     Status: None   Collection Time: 02/09/21  5:19 AM  Result Value Ref Range   aPTT 34 24 - 36 seconds    Comment: Performed at [facility], [address] [place], [place] [address]  Comprehensive metabolic panel     Status: Abnormal   Collection Time: 02/09/21  5:19 AM  Result Value Ref Range   Sodium 144 135 - 145 mmol/L   Potassium 3.1 (L) 3.5 - 5.1 mmol/L   Chloride 104 98 - 111 mmol/L   CO2 32 22 - 32 mmol/L   Glucose, Bld 142 (H) 70 - 99 mg/dL    Comment: Glucose reference range applies only to samples taken after fasting for at least 8 hours.   BUN 23 (H) 6 - 20 mg/dL   Creatinine, Ser 0.51 0.44 - 1.00 mg/dL   Calcium 8.7 (L) 8.9 - 10.3 mg/dL   Total Protein 6.3 (L) 6.5 - 8.1 g/dL   Albumin 2.2 (L) 3.5 - 5.0 g/dL   AST 140 (H) 15 - 41 U/L   ALT 74 (H) 0 - 44 U/L   Alkaline Phosphatase 135 (H) 38 - 126  U/L   Total Bilirubin 2.8 (H) 0.3 - 1.2 mg/dL   GFR, Estimated >60 >60  mL/min    Comment: (NOTE) Calculated using the CKD-EPI Creatinine Equation (2021)    Anion gap 8 5 - 15    Comment: Performed at [facility], [address] [place] [place], [place] [address]  Glucose, capillary     Status: Abnormal   Collection Time: 02/09/21  7:51 AM  Result Value Ref Range   Glucose-Capillary 148 (H) 70 - 99 mg/dL    Comment: Glucose reference range applies only to samples taken after fasting for at least 8 hours.  Glucose, capillary     Status: Abnormal   Collection Time: 02/09/21 11:21 AM  Result Value Ref Range   Glucose-Capillary 154 (H) 70 - 99 mg/dL    Comment: Glucose reference range applies only to samples taken after fasting for at least 8 hours.  Glucose, capillary     Status: Abnormal   Collection Time: 02/09/21  3:36 PM  Result Value Ref Range   Glucose-Capillary 166 (H) 70 - 99 mg/dL    Comment: Glucose reference range applies only to samples taken after fasting for at least 8 hours.  Glucose, capillary     Status: Abnormal   Collection Time: 02/09/21  7:24 PM  Result Value Ref Range   Glucose-Capillary 209 (H) 70 - 99 mg/dL    Comment: Glucose reference range applies only to samples taken after fasting for at least 8 hours.   Comment 1 Notify RN    Comment 2 Document in Chart   Glucose, capillary     Status: Abnormal   Collection Time: 02/09/21 11:24 PM  Result Value Ref Range   Glucose-Capillary 167 (H) 70 - 99 mg/dL    Comment: Glucose reference range applies only to samples taken after fasting for at least 8 hours.  Glucose, capillary     Status: Abnormal   Collection Time: 02/10/21  3:34 AM  Result Value Ref Range   Glucose-Capillary 155 (H) 70 - 99 mg/dL    Comment: Glucose reference range applies only to samples taken after fasting for at least 8 hours.  Magnesium     Status: None   Collection Time: 02/10/21  6:00 AM  Result Value Ref Range   Magnesium 1.8 1.7 - 2.4 mg/dL    Comment: Performed at [facility], [address] [place], [place] [address]  Phosphorus     Status: None   Collection Time: 02/10/21  6:00 AM  Result Value Ref Range   Phosphorus 3.0 2.5 - 4.6 mg/dL    Comment: Performed at [facility], [address] [place], [place] [address]  CBC with Differential/Platelet     Status: Abnormal   Collection Time: 02/10/21  6:00 AM  Result Value Ref Range   WBC 20.5 (H) 4.0 - 10.5 K/uL   RBC 2.09 (L) 3.87 - 5.11 MIL/uL   Hemoglobin 7.9 (L) 12.0 - 15.0 g/dL   HCT 25.9 (L) 36.0 - 46.0 %   MCV 123.9 (H) 80.0 - 100.0 fL   MCH 37.8 (H) 26.0 - 34.0 pg   MCHC 30.5 30.0 - 36.0 g/dL   RDW 26.3 (H) 11.5 - 15.5 %   Platelets 218 150 - 400 K/uL   nRBC 0.0 0.0 - 0.2 %   Neutrophils Relative % 79 %   Neutro Abs 16.4 (H) 1.7 - 7.7 K/uL   Lymphocytes Relative 8 %   Lymphs Abs 1.6 0.7 -  4.0 K/uL   Monocytes Relative 9 %   Monocytes Absolute 1.7 (H) 0.1 - 1.0 K/uL   Eosinophils Relative 3 %   Eosinophils Absolute 0.5 0.0 - 0.5 K/uL   Basophils Relative 0 %   Basophils Absolute 0.1 0.0 - 0.1 K/uL   WBC Morphology MORPHOLOGY UNREMARKABLE    Smear Review Normal platelet morphology    Immature Granulocytes 1 %   Abs Immature Granulocytes 0.18 (H) 0.00 - 0.07 K/uL   Polychromasia PRESENT     Comment: Performed at [facility], [address] [place], [place] [address]  Comprehensive metabolic panel     Status: Abnormal   Collection Time: 02/10/21  6:00 AM  Result Value Ref Range   Sodium 143 135 - 145 mmol/L   Potassium 3.1 (L) 3.5 - 5.1 mmol/L   Chloride 103 98 - 111 mmol/L   CO2 34 (H) 22 - 32 mmol/L   Glucose, Bld 166 (H) 70 - 99 mg/dL    Comment: Glucose reference range applies only to samples taken after fasting for at least 8 hours.   BUN 27 (H) 6 - 20 mg/dL   Creatinine, Ser 0.50 0.44 - 1.00 mg/dL   Calcium 8.7 (L) 8.9 - 10.3 mg/dL   Total Protein 6.6 6.5 - 8.1 g/dL   Albumin 2.1 (L) 3.5 - 5.0 g/dL   AST 127 (H) 15 - 41 U/L   ALT 68 (H) 0 - 44 U/L   Alkaline  Phosphatase 145 (H) 38 - 126 U/L   Total Bilirubin 2.6 (H) 0.3 - 1.2 mg/dL   GFR, Estimated >60 >60 mL/min    Comment: (NOTE) Calculated using the CKD-EPI Creatinine Equation (2021)    Anion gap 6 5 - 15    Comment: Performed at [facility], [address] [place], [place] [address]  Protime-INR     Status: Abnormal   Collection Time: 02/10/21  6:00 AM  Result Value Ref Range   Prothrombin Time 15.8 (H) 11.4 - 15.2 seconds   INR 1.3 (H) 0.8 - 1.2    Comment: (NOTE) INR goal varies based on device and disease states. Performed at [facility], [address] [place], [place] [address]   APTT     Status: None   Collection Time: 02/10/21  6:00 AM  Result Value Ref Range   aPTT 33 24 - 36 seconds    Comment: Performed at [facility], [address] [place], [place] [address]  Glucose, capillary     Status: Abnormal   Collection Time: 02/10/21  7:50 AM  Result Value Ref Range   Glucose-Capillary 157 (H) 70 - 99 mg/dL    Comment: Glucose reference range applies only to samples taken after fasting for at least 8 hours.  Glucose, capillary     Status: Abnormal   Collection Time: 02/10/21 11:27 AM  Result Value Ref Range   Glucose-Capillary 163 (H) 70 - 99 mg/dL    Comment: Glucose reference range applies only to samples taken after fasting for at least 8 hours.  Glucose, capillary     Status: Abnormal   Collection Time: 02/10/21  3:33 PM  Result Value Ref Range   Glucose-Capillary 163 (H) 70 - 99 mg/dL    Comment: Glucose reference range applies only to samples taken after fasting for at least 8 hours.    Recent Results (from the past 240 hour(s))  Body fluid culture w Gram Stain     Status: None  Collection Time: 02/04/21  2:23 PM   Specimen: PATH Cytology Peritoneal fluid  Result Value Ref Range Status   Specimen Description   Final    PERITONEAL Performed at [facility], [address], [place], [place] [address]    Special  Requests   Final    NONE Performed at [facility], [address] [place], [place] [address]    Gram Stain   Final    NO SQUAMOUS EPITHELIAL CELLS SEEN NO WBC SEEN NO ORGANISMS SEEN    Culture   Final    NO GROWTH 3 DAYS Performed at [facility], [place] [address], [place], [place] [address]    Report Status 02/08/2021 FINAL  Final    Lipid Panel No results for input(s): CHOL, TRIG, HDL, CHOLHDL, VLDL, LDLCALC in the last 72 hours.  Studies/Results: CT HEAD WO CONTRAST (5MM)  Result Date: 02/10/2021 CLINICAL DATA:  Stroke, follow up EXAM: CT HEAD WITHOUT CONTRAST TECHNIQUE: Contiguous axial images were obtained from the base of the skull through the vertex without intravenous contrast. COMPARISON:  02/08/2021 FINDINGS: Brain: Persistent small volume layering hemorrhage in the occipital horns. Similar minimal sulcal subarachnoid hemorrhage in the right frontal region. No new hemorrhage. No new loss of gray-white differentiation. Ventricles are stable in size. Vascular: No new finding. Skull: Calvarium is unremarkable. Sinuses/Orbits: No acute finding. Other: Bilateral mastoid and middle ear opacification. IMPRESSION: Similar residual subarachnoid hemorrhage.  No new abnormality. Electronically Signed   By: Macy Mis M.D.   On: 02/10/2021 10:25   DG Chest Port 1 View  Result Date: 02/10/2021 CLINICAL DATA:  Respiratory failure, hypoxia EXAM: PORTABLE CHEST 1 VIEW COMPARISON:  02/07/2021 FINDINGS: Interval increase in a layering right pleural effusion, now moderate. Similar diffuse bilateral interstitial opacity. Cardiomegaly. Support apparatus is unchanged, including endotracheal tube and esophagogastric tube. IMPRESSION: 1. Interval increase in a layering right pleural effusion, now moderate. 2. Similar diffuse bilateral interstitial opacity, likely edema. 3. Cardiomegaly. 4. Unchanged support apparatus. Electronically Signed   By: Delanna Ahmadi M.D.   On: 02/10/2021  08:11    Medications: Scheduled:  vitamin C  500 mg Per Tube BID   chlorhexidine gluconate (MEDLINE KIT)  15 mL Mouth Rinse BID   Chlorhexidine Gluconate Cloth  6 each Topical Daily   docusate  100 mg Per Tube BID   feeding supplement (PROSource TF)  45 mL Per Tube Daily   folic acid  1 mg Per Tube Daily   free water  200 mL Per Tube Q4H   influenza vac split quadrivalent PF  0.5 mL Intramuscular Tomorrow-1000   insulin aspart  0-15 Units Subcutaneous Q4H   ipratropium-albuterol  3 mL Nebulization TID   lactulose  30 g Per Tube BID   mouth rinse  15 mL Mouth Rinse 10 times per day   midodrine  5 mg Per Tube TID WC   nutrition supplement (JUVEN)  1 packet Per Tube BID BM   pantoprazole sodium  40 mg Per Tube Q1200   rifaximin  550 mg Per Tube BID   sodium chloride flush  10-40 mL Intracatheter Q12H   thiamine injection  100 mg Intravenous Daily   zinc sulfate  220 mg Per Tube Daily   Continuous:  sodium chloride Stopped (02/08/21 0444)   feeding supplement (VITAL 1.5 CAL) 1,000 mL (02/09/21 2334)    Assessment: 55 yo woman with hx MDD, EtOH abuse (active c/b), liver cirrhosis, tobacco abuse, multiple previous hospital admissions for EtOH  withdrawal who presented to ED 01/20/21 with hepatic encephalopathy, jaundice, and abd pain. She was admitted to floor but ultimately trasferred to ICU for unstable vital signs and hyperactive delirium c/f DTs. MRI brain 10/30 showed acute/subacute paramedian pontine ischemic infarct. Encephalopathy likely multifactorial in setting of hyperammonemia, hepatitis, aspiration PNA, acute/subacute ischemic pontine infarct.  - Exam improving in terms of cognitive function. Now with eyes spontaneously open, will gaze to the left and right on request, will nod head to questioning and will wiggle toes to command. Still with flaccid tone x 4. Will move lower extremities with 2/5 strength to command and noxious, but with no movement of upper extremities to any  stimuli. DDx for tetraparesis includes severe fatigue due to intercurrent illness, ICU neuropathy, ICU myopathy and statin myopathy versus possible central cord lesion within the cervical spine or extension of recent pontine stroke.  - Recent B12 level was normal.  - MRI brain (10/30): Acute/subacute 9 mm right paramedian nonhemorrhagic infarct in the central pons. Symmetric T2 signal changes and restricted diffusion in the thalami bilaterally. This is nonspecific, but may represent artery of [place] infarcts versus hepatic encephalopathy changes given the striking symmetry of the thalamic lesions - CTA head and neck: No acute intracranial abnormality. A 6 mm left paraclinoid ICA aneurysm. Consult to neuro intervention suggested. No intracranial large vessel occlusion or hemodynamically significant stenosis. Intracranial stenosis described on prior MR angiogram are likely artifactual. Patent major neck arteries without hemodynamically significant stenosis. - EEG (10/31): Findings suggestive of moderate to severe diffuse encephalopathy, nonspecific etiology. No seizures or epileptiform discharges were seen throughout the recording.  - MRI brain 11/6 reveals new scattered small volume subarachnoid hemorrhage over both cerebral convexities and small amount of intraventricular hemorrhage dependently within the lateral ventricles. Unchanged size of subacute pontine infarct. Resolved thalamic diffusion abnormality. - MRI cervical spine does not show any spinal cord abnormality. Degenerative changes of the vertebral column are noted.  - The subarachoid blood is most likely secondary to a coagulation disorder in the setting of the patient's liver disease. The patchy distribution does not strongly suggest an aneurysmal bleed. Repeat CTA H&N showed unchanged 82m L paraclinoid ICA aneurysm. - Repeat head CT 11/7 and 11/9 showed overall stability: Persistent small-volume subarachnoid hemorrhage overlying  the bilateral cerebral hemispheres, now predominantly overlying the high right frontal lobe. Redemonstrated small-volume hemorrhage layering within the occipital horns of the lateral ventricles (left greater than right). No significant interval intracranial hemorrhage is identified. No evidence of acute cortical infarct or hydrocephalus. - Encephalopathy is multifactorial. Main contributor is hepatic encephalopathy with severe hyperammonemia. Subtle thalamic and basal ganglia diffuse signal changes on MRI are compatible with a severe metabolic encephalopathy. Other contributing factors include alcohol withdrawal, hospital delirium and infection.  - Moderate-severe alcohol withdrawal with delirium   Recommendations: - Monitor and manage her hyperammonemia and acute alcoholic hepatitis.  - Agree with thiamine and CIWA protocol. Cont thiamine 100 mg QD - CT head today stable; STAT repeat head CT for any decline in neuro exam - q4 hr neuro checks - Inpatient seizure precautions - Telemetry - Hold ASA 82mdaily for now in setting of SAH; stable on multiple scans - Would not resume statin now or in the future - Vitamin E and copper levels - Trach planned for tmrw  CoSu MonksMD [facility] [phone] 7pm- 7am, please page neurology on call as listed in [place]

## 2021-02-10 NOTE — Progress Notes (Signed)
Pt transported to CT and back to CCU without issues.

## 2021-02-10 NOTE — Progress Notes (Addendum)
NAME:  [name] Carrillo, MRN:  [id number], DOB:  [birth year], LOS: 49 ADMISSION DATE:  01/20/2021, CONSULTATION DATE:  01/24/21 REFERRING MD:  Priscella Mann, CHIEF COMPLAINT:  unresponsive   History of Present Illness:  55 yo F wthi hx of MDD, Anxiety disorder, osteomyelitis of left tibia, left tibial fracture history, recurrent UTIs, EtOH abuse and liver Cirrhosis ([place]), active alcoholism, tobacco abuse with multiple readmissions to hospital with EtOH withdrawal syndrome and now being brought into MICU due to unstable vital signs and aggitated hyperactive delerium with concerns for delerium tremens. Blood work is notable for transaminitis with decreased synthetic function.     Patient had ziplock bag with two teeth in there due to loose teeth in mouth. Husband shares that patient has few false teeth that she had created herself.    Ive met with husband and son and explained that patient is unresponsive with GCS7 and is not able to protect airway. They understand the severity of situation and agree for intubation if needed. We discussed goals of care and patient is full code.   Pertinent  Medical History  MDD Anxiety Alcohol use disorder Alcoholic cirrhosis Smoking  Significant Hospital Events: Including procedures, antibiotic start and stop dates in addition to other pertinent events   10/19 Pt admitted to the [facility] unit with delirium tremors secondary to ETOH withdrawal  10/19: CT Head negative  10/19: CT Abd/Pelvis revealed Hepatomegaly with heterogeneous liver enhancement and evidence of portal venous hypertension including recanalized paraumbilical vein, small spleno-renal varices, and small volume ascites. Chronic liver disease strongly suspected although the enhancement pattern might indicate a superimposed acute hepatitis. No evidence of bile duct obstruction. No discrete liver lesion. No obstructive uropathy but absent renal contrast excretion on the delayed images suggesting acute renal  insufficiency. Punctate nephrolithiasis. Borderline to mild cardiomegaly. Bilateral lower lobe atelectasis. Mild aortic atherosclerosis.  10/24 Transfer to ICU, intubation, CVL placed 10/25: Echo revealed EF 60 to 65% 10/28: Pt with hepatic encephalopathy currently undergoing SBT 10/5.  14L positive will administer 40 mg iv lasix x1 dose  10/28: CT Head motion limited exam. Allowing for this, no acute intracranial abnormality. 10/28: CT Maxillofacial revealed prominent dental caries within the residual right maxillary molar. No associated inflammation. No other focal dental or periodontal disease to suggest infection. Minimal inflammatory changes about the lateral right orbit. No abscess. 10/31: CTA Head revealed no acute intracranial abnormality. A 6 mm left paraclinoid ICA aneurysm. Consult to neuro intervention suggested. No intracranial large vessel occlusion or hemodynamically significant stenosis. Intracranial stenosis described on prior MR angiogram are likely artifactual. Patent major neck arteries without hemodynamically significant stenosis. 10/31: MRA Head/Neck revealed medially directed 6 mm aneurysm from the distal left cavernous carotid/ophthalmic segment. Poor visualization of the right cavernous carotid, which may indicate slow flow or stenosis. Consider CTA for further evaluation. Apparent narrowing of the distal basilar artery, which may be artifactual or indicate focal stenosis. No hemodynamically significant stenosis in the neck. No large vessel occlusion. 10/31: EEG suggestive of moderate to severe diffuse encephalopathy, nonspecific etiology. No seizures or epileptiform discharges were seen throughout the recording.  11/1: Pt remains severely encephalopathic despite sedation remaining off since 10/31 '@11' :22 am  11/2: Remains encephalopathic off sedation.  ENT consulted for [facility], family wishes to proceed next week. 11/3: No change in mental status, remains encephalopathic.  Hypernatremia unchanged (150), increase Free Water flushes to 250 cc q4h, hold off on diuresis.  IR to evaluate for Paracentesis 11/5: Opens eyes to voice but not  following commands, plan to diurese today 11/6 remains intubated. MRI Brain reveals new scattered small volume subarachnoid hemorrhage over both cerebral convexities and small amount of intraventricular hemorrhage dependently within the lateral ventricles. Unchanged size of subacute pontine infarct. Resolved thalamic diffusion abnormality. MRI cervical spine does not show any spinal cord abnormality 11/7: More alert today.  Plan for repeat CTH to follow up on subarachnoid hemorrhage.  Plan for diuresis 11/8: Neuro status unchanged. Attempt to exercise respiratory muscles spontaneous mode.  Still remains too weak to protect her airway.  Plan for diuresis 11/9: Neuro status unchanged.  Tolerated SBT for about 2 hours, then exhibiting signs of respiratory muscle fatigue.  Repeat CTH with no new abnormality.  Will proceed with [facility] tomorrow as planned  Interim History / Subjective:  -No significant events overnight -Afebrile, hemodynamically stable, no vasopressors -Neuro status unchanged, opens eyes spontaneously, tracks, nods to questions, follows commands with LE, tries with UE but extremely weak -Placed in SBT, tolerated for about 2 hrs then showing signs of fatigue (increased RR 30's, increased WOB) -Extremely weak ~ too weak to protect her airway ~ will proceed with  plan for [facility] on Thursday 11/10 -Repeat CTH today showed similar residual subarachnoid hemorrhage (NO NEW ABNORMALITY) -Creatinine stable,  UOP 4.1 L yesterday with diuresis (net+ 15.6L since admit) ~ will hold diuresis today due to softer BP   Objective   Blood pressure 123/60, pulse 99, temperature 99.5 F (37.5 C), resp. rate (!) 32, height '5\' 5"'  (1.651 m), weight 95 kg, last menstrual period 09/07/2015, SpO2 92 %.    Vent Mode: Spontaneous FiO2 (%):  [35 %] 35  % Set Rate:  [20 bmp] 20 bmp Vt Set:  [400 mL] 400 mL PEEP:  [5 cmH20] 5 cmH20 Pressure Support:  [5 cmH20] 5 cmH20   Intake/Output Summary (Last 24 hours) at 02/10/2021 1126 Last data filed at 02/10/2021 0840 Gross per 24 hour  Intake 2390 ml  Output 5285 ml  Net -2895 ml    Filed Weights   02/06/21 0451 02/07/21 0421 02/08/21 0500  Weight: 95.3 kg 97.4 kg 95 kg    Examination: General: acutely ill appearing female, NAD, mechanically intubated, awake and alert HENT: Atraumatic, normocephalic, supple, no JVD  Lungs: diffuse rhonchi throughout, even, non labored, overbreathing the vent Cardiovascular: Regular rate & rhythm, no R/G, 2+ radial/1+ distal pulses, 2+ generalized edema  Abdomen: +BS x4, soft, obese, abdominal distension present  Extremities: normal bulk and tone, severe generalized weakness Neuro: Off sedation, awake and alert, nods to questions, intermittently follows commands but extremely weak, PERRL, icteric sclera  GU: indwelling foley catheter draining yellow urine    Resolved Hospital Problem list     Assessment & Plan:   Acute encephalopathy multifactorial secondary acute/subacute ischemic pontine infarct, hyperammonemia and infectious process  New scattered small volume subarachnoid hemorrhage over both cerebral convexities and small amount of intraventricular hemorrhage Moderate-severe alcohol withdrawal with delirium Sedation needs in setting of mechanical ventilation -Continue to hold all sedation   -Neurology following, appreciate input~per neuro aneurysm on CTA Head/Neck and MRA Head Neck can be followed in the outpatient setting  -CIWA protocol -Continue thiamine, folate and multivitamins -Continue lactulose 30 g per tube daily and rifaximin  -Trend ammonia  -ASA/anticoagulation discontinued -Neurology feels the subarachoid blood is most likely secondary to a coagulation disorder in the setting of the patient's liver disease. The patchy distribution  does not strongly suggest an aneurysmal bleed.   -Repeat CTA Head 11/6 showed unchanged  6 mm Left Paraclinoid ICA Aneurysm  -Repeat CTH 11/9 showed overall stability of subarachnoid hemorrhage   UTI due to E. coli/Klebsiella~completed course of ceftriaxone  Worsening leukocytosis secondary to questionable pneumonia ~ treated -Monitor fever curve -Trend WBC's & Procalcitonin -Follow cultures as above -Zosyn discontinued (10/29 - 11/1) as follow up Tracheal aspirate from 10/28 with rare normal respiratory flora -Paracentesis culture from 11/3  with no growth   Acute hypoxic respiratory failure in the setting of Hepatic Encephalopathy and suspected Pneumonia -Full vent support, implement lung protective strategies -Plateau pressures less than 30 cm H20 -Wean FiO2 & PEEP as tolerated to maintain O2 sats >92% -Follow intermittent Chest X-ray & ABG as needed -Spontaneous Breathing Trials when respiratory parameters met and mental status permits -Mental Status and severe weakness is currently Precluding weaning -Will need [facility] ~ ENT consulted, tentative plan for [facility] Thursday 11/10  -Implement VAP Bundle -Prn Bronchodilators -Diuresis as BP and renal function permits   Acute alcoholic hepatitis Ascites  -Monitor hepatic function panel and bleeding times -S/p paracentesis on 11/3 with removal of 1.5L ~ culture with no growth   Hypernatremia ~ resolved Hypokalemia -Monitor I&O's / urinary output -Follow BMP -Ensure adequate renal perfusion -Avoid nephrotoxic agents as able -Replace electrolytes as indicated -Pharmacy following for assistance with electrolyte replacement -Continue free water flushes to 200 cc q4h  Severe protein calorie malnutrition Dietitian consulted appreciate input~continue tube feeds  Macrocytic anemia due to folate deficiency Thrombocytopenia Likely driven by liver disease/alcoholism -Monitor for S/Sx of bleeding -Trend CBC -SCD's for VTE Prophylaxis  (chemical prophylaxis contraindicated due to subarachnoid hemorrhage) -Transfuse for Hgb <7 -Continue folic acid supplementation    Best Practice (right click and "Reselect all SmartList Selections" daily)   Diet/type: tubefeeds DVT prophylaxis: SCD; (chemical prophylaxis contraindicated due to subarachnoid hemorrhage) GI prophylaxis: PPI Lines: N/A Foley:  Yes and still indicated  Code Status:  full code Last date of multidisciplinary goals of care discussion [02/10/2021]  Updated pt's husband at bedside 02/10/21.  All questions answered.  Critical care time: 37 minutes    Darel Hong, AGACNP-BC [facility] Prefer epic messenger for cross cover needs If after hours, please call E-link

## 2021-02-10 NOTE — Progress Notes (Signed)
PHARMACY CONSULT NOTE - FOLLOW UP  Pharmacy Consult for Electrolyte Monitoring and Replacement   Recent Labs: Potassium (mmol/L)  Date Value  02/10/2021 3.1 (L)  04/28/2012 3.9   Magnesium (mg/dL)  Date Value  20/94/7096 1.8   Calcium (mg/dL)  Date Value  28/36/6294 8.7 (L)   Calcium, Total (mg/dL)  Date Value  76/54/6503 8.3 (L)   Albumin (g/dL)  Date Value  54/65/6812 2.1 (L)   Phosphorus (mg/dL)  Date Value  75/17/0017 3.0   Sodium (mmol/L)  Date Value  02/10/2021 143  04/28/2012 136     Assessment: 55 year old female with history of alcohol use and liver cirrhosis. Patient transferred to the ICU 10/24 and intubated; remains on mechanical ventilation off sedation. MRI brain with new small volume subarachnoid hemorrhage. Plan for tracheostomy 11/10. Patient has been receiving intermittent diuresis. Pharmacy consult for electrolyte management.  Goal of Therapy:  Electrolytes WNL  Plan:  --Potassium 40 mEq per tube x 2 doses --Recheck electrolytes with morning labs  Pricilla Riffle, PharmD, BCPS Clinical Pharmacist 02/10/2021 11:18 AM

## 2021-02-10 NOTE — Progress Notes (Signed)
Pt alert, gripped hands weakly this shift. Wiggled toes on commands and nod/shook head to questions. Appears to understand questions/commands. Pt tolerating ventilator. Not agitated this shift. To and from head CT. Full bath and linen changed. VSS. > weakness in upper extremities compared to lower. Foley and flexi seal intact.

## 2021-02-11 ENCOUNTER — Inpatient Hospital Stay: Payer: BC Managed Care – PPO | Admitting: Anesthesiology

## 2021-02-11 ENCOUNTER — Encounter: Payer: Self-pay | Admitting: Otolaryngology

## 2021-02-11 ENCOUNTER — Inpatient Hospital Stay: Payer: BC Managed Care – PPO

## 2021-02-11 ENCOUNTER — Encounter
Admission: EM | Disposition: A | Discharge status: Nursing Home (Includes ICF) | Payer: Self-pay | Source: Home / Self Care | Attending: Internal Medicine

## 2021-02-11 DIAGNOSIS — G9341 Metabolic encephalopathy: Secondary | ICD-10-CM | POA: Diagnosis not present

## 2021-02-11 HISTORY — PX: TRACHEOSTOMY TUBE PLACEMENT: SHX814

## 2021-02-11 LAB — CBC WITH DIFFERENTIAL/PLATELET
Abs Immature Granulocytes: 0.16 10*3/uL — ABNORMAL HIGH (ref 0.00–0.07)
Basophils Absolute: 0.1 10*3/uL (ref 0.0–0.1)
Basophils Relative: 0 %
Eosinophils Absolute: 0.4 10*3/uL (ref 0.0–0.5)
Eosinophils Relative: 2 %
HCT: 26.5 % — ABNORMAL LOW (ref 36.0–46.0)
Hemoglobin: 7.9 g/dL — ABNORMAL LOW (ref 12.0–15.0)
Immature Granulocytes: 1 %
Lymphocytes Relative: 7 %
Lymphs Abs: 1.6 10*3/uL (ref 0.7–4.0)
MCH: 36.7 pg — ABNORMAL HIGH (ref 26.0–34.0)
MCHC: 29.8 g/dL — ABNORMAL LOW (ref 30.0–36.0)
MCV: 123.3 fL — ABNORMAL HIGH (ref 80.0–100.0)
Monocytes Absolute: 2 10*3/uL — ABNORMAL HIGH (ref 0.1–1.0)
Monocytes Relative: 9 %
Neutro Abs: 18 10*3/uL — ABNORMAL HIGH (ref 1.7–7.7)
Neutrophils Relative %: 81 %
Platelets: 219 10*3/uL (ref 150–400)
RBC: 2.15 MIL/uL — ABNORMAL LOW (ref 3.87–5.11)
RDW: 26.2 % — ABNORMAL HIGH (ref 11.5–15.5)
Smear Review: NORMAL
WBC: 22.3 10*3/uL — ABNORMAL HIGH (ref 4.0–10.5)
nRBC: 0.1 % (ref 0.0–0.2)

## 2021-02-11 LAB — COMPREHENSIVE METABOLIC PANEL
ALT: 67 U/L — ABNORMAL HIGH (ref 0–44)
AST: 125 U/L — ABNORMAL HIGH (ref 15–41)
Albumin: 2.1 g/dL — ABNORMAL LOW (ref 3.5–5.0)
Alkaline Phosphatase: 137 U/L — ABNORMAL HIGH (ref 38–126)
Anion gap: 4 — ABNORMAL LOW (ref 5–15)
BUN: 28 mg/dL — ABNORMAL HIGH (ref 6–20)
CO2: 34 mmol/L — ABNORMAL HIGH (ref 22–32)
Calcium: 8.5 mg/dL — ABNORMAL LOW (ref 8.9–10.3)
Chloride: 105 mmol/L (ref 98–111)
Creatinine, Ser: 0.41 mg/dL — ABNORMAL LOW (ref 0.44–1.00)
GFR, Estimated: >60 mL/min (ref >60)
Glucose, Bld: 123 mg/dL — ABNORMAL HIGH (ref 70–99)
Potassium: 3.8 mmol/L (ref 3.5–5.1)
Sodium: 143 mmol/L (ref 135–145)
Total Bilirubin: 3.1 mg/dL — ABNORMAL HIGH (ref 0.3–1.2)
Total Protein: 6.8 g/dL (ref 6.5–8.1)

## 2021-02-11 LAB — GLUCOSE, CAPILLARY
Glucose-Capillary: 109 mg/dL — ABNORMAL HIGH (ref 70–99)
Glucose-Capillary: 153 mg/dL — ABNORMAL HIGH (ref 70–99)
Glucose-Capillary: 168 mg/dL — ABNORMAL HIGH (ref 70–99)
Glucose-Capillary: 235 mg/dL — ABNORMAL HIGH (ref 70–99)
Glucose-Capillary: 188 mg/dL — ABNORMAL HIGH (ref 70–99)

## 2021-02-11 LAB — PROTIME-INR
INR: 1.3 — ABNORMAL HIGH (ref 0.8–1.2)
Prothrombin Time: 15.9 seconds — ABNORMAL HIGH (ref 11.4–15.2)

## 2021-02-11 LAB — PHOSPHORUS: Phosphorus: 2.8 mg/dL (ref 2.5–4.6)

## 2021-02-11 LAB — MAGNESIUM: Magnesium: 1.8 mg/dL (ref 1.7–2.4)

## 2021-02-11 LAB — PROCALCITONIN: Procalcitonin: 0.41 ng/mL

## 2021-02-11 LAB — APTT: aPTT: 33 seconds (ref 24–36)

## 2021-02-11 IMAGING — DX DG ABDOMEN 1V
1 series · 1 of 1 positions shown · non-contrast
Comparison: [DATE]

CLINICAL DATA: Evaluate enteric tube placement

EXAM:
ABDOMEN - 1 VIEW

[abdomen supine]
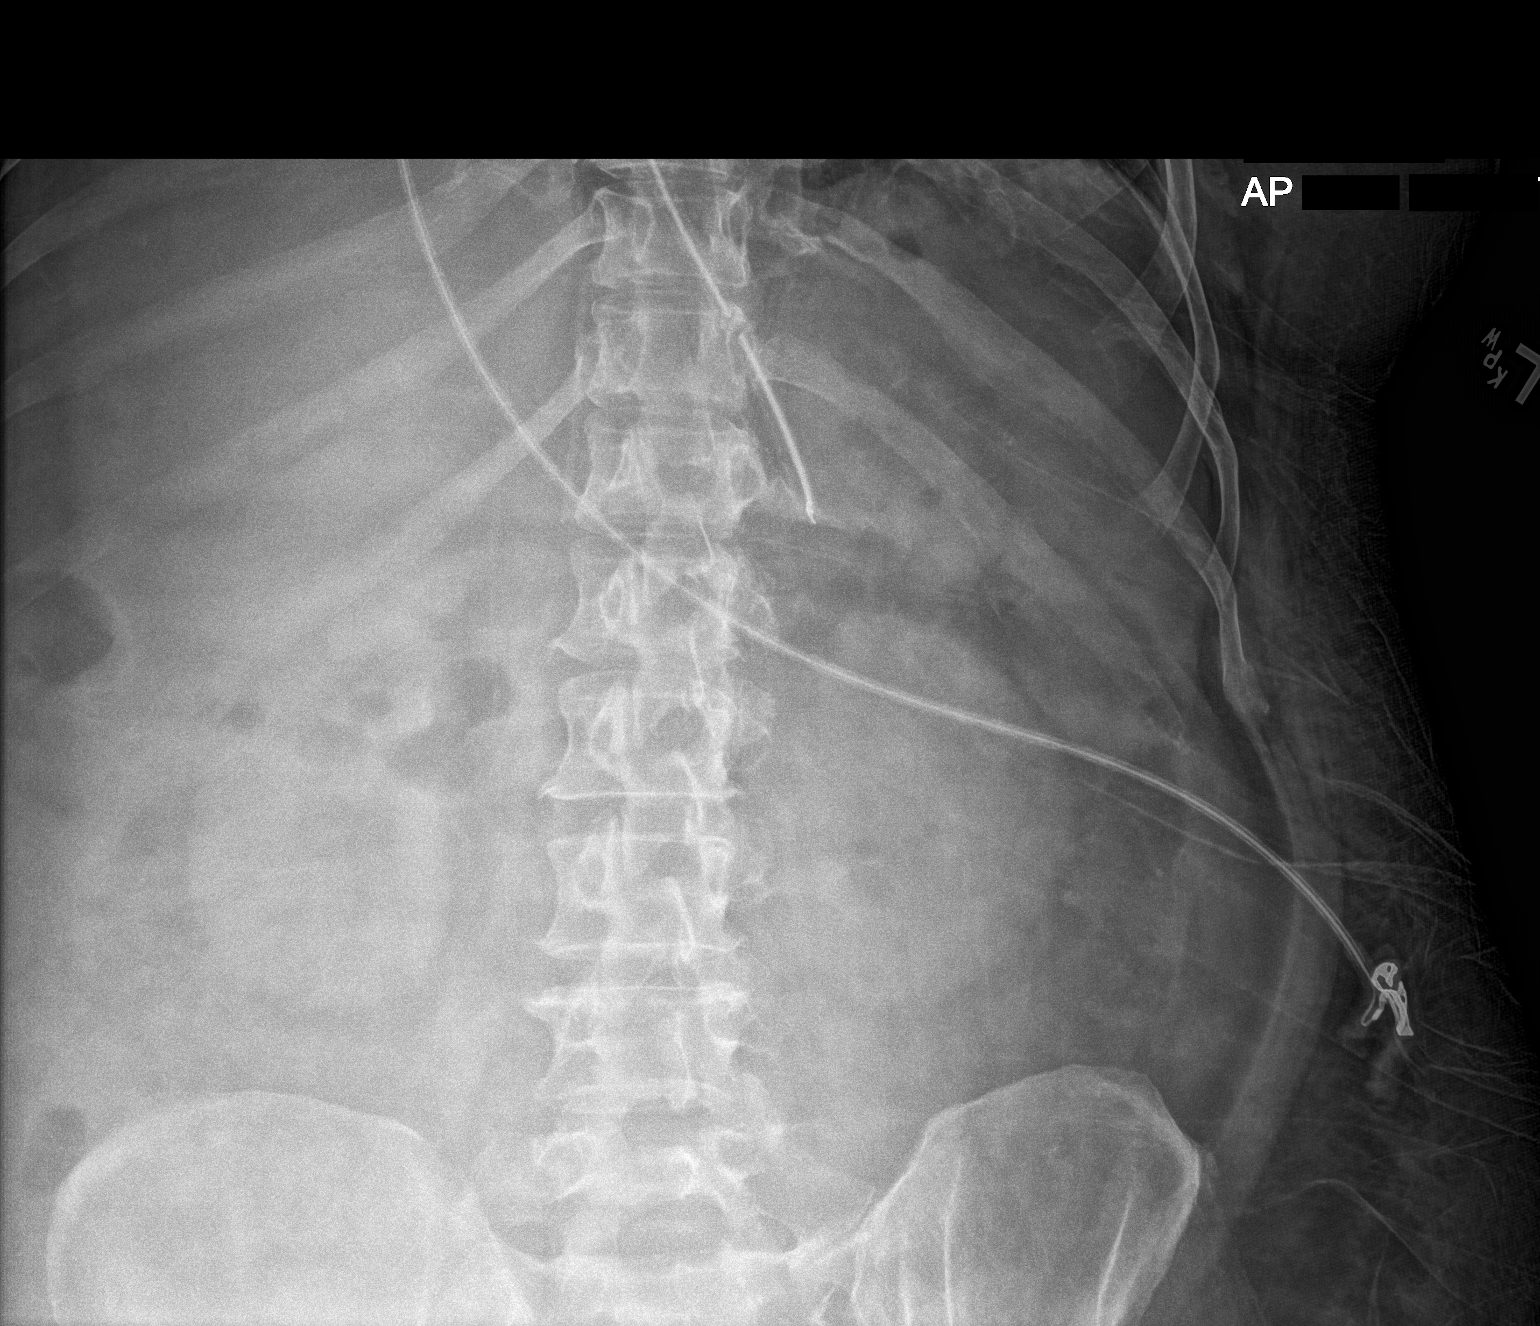

[1 of 1 positions shown; findings below may reference images not displayed]

FINDINGS: Interval placement of NG tube. The tip is below the GE junction in
the expected location of the stomach. The side port of the NG tube
is below the GE junction by approximately 1.9 cm. No dilated bowel
loops identified.
IMPRESSION: NG tube tip is in the stomach.

## 2021-02-11 IMAGING — DX DG CHEST 1V PORT
1 series · 1 of 1 positions shown · non-contrast
Comparison: [DATE]

CLINICAL DATA: Respiratory failure

EXAM:
PORTABLE CHEST 1 VIEW

[chest ap]
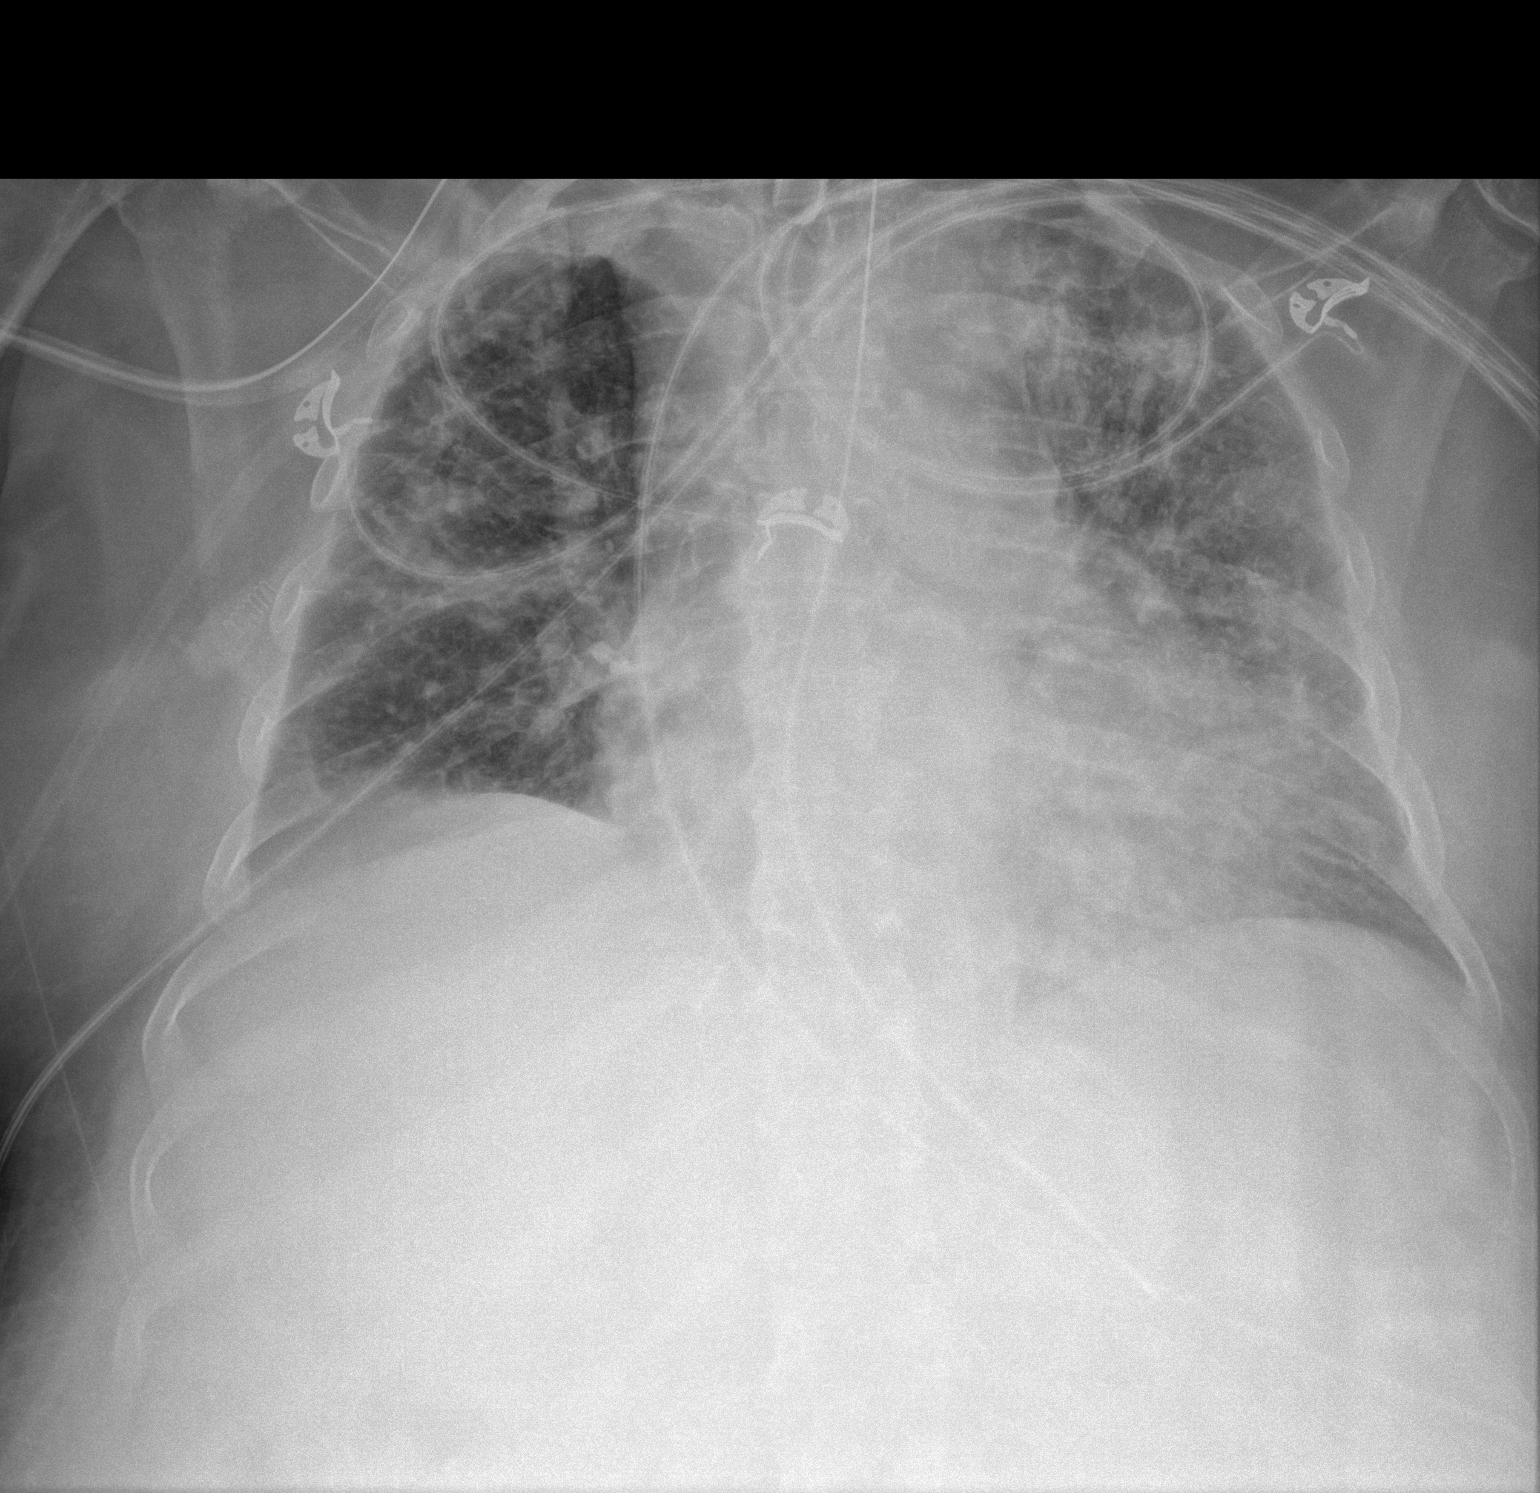

[1 of 1 positions shown; findings below may reference images not displayed]

FINDINGS: Interval extubation and placement of tracheostomy tube, which
appears appropriately positioned. Enteric tube extends below the
diaphragm with distal tip in the gastric body. Stable cardiomegaly.
Diffuse bilateral interstitial opacities, left worse than right.
Small bilateral pleural effusions, right greater than left. No
pneumothorax.
IMPRESSION: 1. Interval extubation and placement of tracheostomy tube, which
appears appropriately positioned.
2. Small bilateral pleural effusions with diffuse bilateral
interstitial opacities. Findings have slightly worsened compared to
the prior study.

## 2021-02-11 SURGERY — CREATION, TRACHEOSTOMY
Anesthesia: General

## 2021-02-11 MED ORDER — PROPOFOL 10 MG/ML IV BOLUS
INTRAVENOUS | Status: AC
  Filled 2021-02-11: qty 20

## 2021-02-11 MED ORDER — POLYETHYLENE GLYCOL 3350 17 G PO PACK
17.0000 g | PACK | Freq: Every day | ORAL | Status: DC
  Administered 2021-02-13: 17 g
  Filled 2021-02-11: qty 1

## 2021-02-11 MED ORDER — FENTANYL CITRATE (PF) 100 MCG/2ML IJ SOLN
INTRAMUSCULAR | Status: DC | PRN
  Administered 2021-02-11 (×2): 50 ug via INTRAVENOUS

## 2021-02-11 MED ORDER — HEMOSTATIC AGENTS (NO CHARGE) OPTIME
TOPICAL | Status: DC | PRN
  Administered 2021-02-11: 1 via TOPICAL

## 2021-02-11 MED ORDER — LIDOCAINE-EPINEPHRINE 1 %-1:100000 IJ SOLN
INTRAMUSCULAR | Status: DC | PRN
  Administered 2021-02-11: 9 mL

## 2021-02-11 MED ORDER — MIDAZOLAM HCL 2 MG/2ML IJ SOLN
2.0000 mg | INTRAMUSCULAR | Status: AC | PRN
  Administered 2021-02-11 – 2021-02-13 (×3): 2 mg via INTRAVENOUS
  Filled 2021-02-11 (×6): qty 2

## 2021-02-11 MED ORDER — DEXMEDETOMIDINE (PRECEDEX) IN NS 20 MCG/5ML (4 MCG/ML) IV SYRINGE
PREFILLED_SYRINGE | INTRAVENOUS | Status: AC
  Filled 2021-02-11: qty 5

## 2021-02-11 MED ORDER — MIDAZOLAM HCL 2 MG/2ML IJ SOLN
INTRAMUSCULAR | Status: DC | PRN
  Administered 2021-02-11: 2 mg via INTRAVENOUS

## 2021-02-11 MED ORDER — MIDAZOLAM HCL 2 MG/2ML IJ SOLN
2.0000 mg | INTRAMUSCULAR | Status: DC | PRN
  Administered 2021-02-11 – 2021-02-12 (×9): 2 mg via INTRAVENOUS
  Filled 2021-02-11 (×6): qty 2

## 2021-02-11 MED ORDER — LACTATED RINGERS IV SOLN: INTRAVENOUS | Status: DC | PRN

## 2021-02-11 MED ORDER — SEVOFLURANE IN SOLN
RESPIRATORY_TRACT | Status: AC
  Filled 2021-02-11: qty 250

## 2021-02-11 MED ORDER — FUROSEMIDE 10 MG/ML IJ SOLN
40.0000 mg | Freq: Once | INTRAMUSCULAR | Status: AC
  Administered 2021-02-11: 40 mg via INTRAVENOUS
  Filled 2021-02-11: qty 4

## 2021-02-11 MED ORDER — FENTANYL CITRATE (PF) 100 MCG/2ML IJ SOLN
INTRAMUSCULAR | Status: AC
  Filled 2021-02-11: qty 2

## 2021-02-11 MED ORDER — MIDAZOLAM HCL 2 MG/2ML IJ SOLN
INTRAMUSCULAR | Status: AC
  Filled 2021-02-11: qty 2

## 2021-02-11 MED ORDER — LIDOCAINE-EPINEPHRINE 1 %-1:100000 IJ SOLN
INTRAMUSCULAR | Status: AC
  Filled 2021-02-11: qty 1

## 2021-02-11 MED ORDER — PHENYLEPHRINE HCL (PRESSORS) 10 MG/ML IV SOLN
INTRAVENOUS | Status: AC
  Filled 2021-02-11: qty 2

## 2021-02-11 MED ORDER — DEXAMETHASONE SODIUM PHOSPHATE 10 MG/ML IJ SOLN
INTRAMUSCULAR | Status: DC | PRN
  Administered 2021-02-11: 10 mg via INTRAVENOUS

## 2021-02-11 MED ORDER — ROCURONIUM BROMIDE 100 MG/10ML IV SOLN
INTRAVENOUS | Status: DC | PRN
  Administered 2021-02-11 (×2): 30 mg via INTRAVENOUS

## 2021-02-11 MED ORDER — DEXMEDETOMIDINE (PRECEDEX) IN NS 20 MCG/5ML (4 MCG/ML) IV SYRINGE
PREFILLED_SYRINGE | INTRAVENOUS | Status: DC | PRN
  Administered 2021-02-11: 8 ug via INTRAVENOUS

## 2021-02-11 SURGICAL SUPPLY — 35 items
BLADE SURG 15 STRL LF DISP TIS (BLADE) ×1 IMPLANT
BLADE SURG 15 STRL SS (BLADE) ×2
BLADE SURG SZ11 CARB STEEL (BLADE) ×2 IMPLANT
DRAPE MAG INST 16X20 L/F (DRAPES) ×2 IMPLANT
ELECT CAUTERY BLADE TIP 2.5 (TIP) ×2
ELECT REM PT RETURN 9FT ADLT (ELECTROSURGICAL) ×2
ELECTRODE CAUTERY BLDE TIP 2.5 (TIP) ×1 IMPLANT
ELECTRODE REM PT RTRN 9FT ADLT (ELECTROSURGICAL) ×1 IMPLANT
GAUZE 4X4 16PLY ~~LOC~~+RFID DBL (SPONGE) ×2 IMPLANT
GAUZE SPONGE 4X4 12PLY STRL (GAUZE/BANDAGES/DRESSINGS) ×1 IMPLANT
GLOVE SURG ENC MOIS LTX SZ7.5 (GLOVE) ×2 IMPLANT
GOWN STRL REUS W/ TWL LRG LVL3 (GOWN DISPOSABLE) ×2 IMPLANT
GOWN STRL REUS W/TWL LRG LVL3 (GOWN DISPOSABLE) ×4
HEMOSTAT SURGICEL 2X3 (HEMOSTASIS) ×1 IMPLANT
HLDR TRACH TUBE NECKBAND 18 (MISCELLANEOUS) ×1 IMPLANT
HOLDER TRACH TUBE NECKBAND 18 (MISCELLANEOUS) ×2
KIT TURNOVER KIT A (KITS) ×2 IMPLANT
LABEL OR SOLS (LABEL) ×2 IMPLANT
MANIFOLD NEPTUNE II (INSTRUMENTS) ×2 IMPLANT
NS IRRIG 500ML POUR BTL (IV SOLUTION) ×2 IMPLANT
PACK HEAD/NECK (MISCELLANEOUS) ×2 IMPLANT
SHEARS HARMONIC 9CM CVD (BLADE) ×2 IMPLANT
SOL PREP POV-IOD 4OZ 10% (MISCELLANEOUS) ×1 IMPLANT
SPONGE DRAIN TRACH 4X4 STRL 2S (GAUZE/BANDAGES/DRESSINGS) ×2 IMPLANT
SPONGE KITTNER 5P (MISCELLANEOUS) ×2 IMPLANT
SUCTION FRAZIER HANDLE 10FR (MISCELLANEOUS) ×2
SUCTION TUBE FRAZIER 10FR DISP (MISCELLANEOUS) ×1 IMPLANT
SUT ETHILON 2 0 FS 18 (SUTURE) ×2 IMPLANT
SUT SILK 2 0 (SUTURE)
SUT SILK 2-0 18XBRD TIE 12 (SUTURE) IMPLANT
SUT VICRYL+ 4-0 18IN PS-4 (SUTURE) ×3 IMPLANT
SYR 10ML LL (SYRINGE) ×2 IMPLANT
TUBE TRACH  6.0 CUFF FLEX (MISCELLANEOUS) ×2
TUBE TRACH 6.0 CUFF FLEX (MISCELLANEOUS) IMPLANT
WATER STERILE IRR 500ML POUR (IV SOLUTION) ×2 IMPLANT

## 2021-02-11 NOTE — Anesthesia Preprocedure Evaluation (Addendum)
Anesthesia Evaluation  Patient identified by MRN, date of birth, ID bandGeneral Assessment Comment:Pt intubated with eyes open and following commands with eyes and head direction. She seems to understand my interview.   Reviewed: Allergy & Precautions, NPO status , Patient's Chart, lab work & pertinent test results  History of Anesthesia Complications Negative for: history of anesthetic complications  Airway       Comment: Unable to assess due to ETT Dental  (+)  Unable to assess:   Pulmonary neg sleep apnea, neg COPD, former smoker,  Acute respiratory failure with hypoxia    - rhonchi (-) wheezing  rales    Cardiovascular (-) hypertension(-) CAD, (-) Past MI and (-) Cardiac Stents  Rhythm:Regular Rate:Normal - Systolic murmurs and - Diastolic murmurs    Neuro/Psych PSYCHIATRIC DISORDERS Anxiety Depression Acute metabolic encephalopathy Moderate-severe alcohol withdrawal with delirium  small-volume subarachnoid hemorrhage CVA    GI/Hepatic negative GI ROS, (+) Cirrhosis     substance abuse  alcohol use,   Endo/Other  negative endocrine ROSneg diabetes  Renal/GU negative Renal ROS     Musculoskeletal negative musculoskeletal ROS (+)   Abdominal (+) + obese,   Peds  Hematology  (+) anemia ,   Anesthesia Other Findings PE: pt unable to move extremities to command  Per Primary: 55 yo woman with hx MDD, EtOH abuse (active c/b), liver cirrhosis, tobacco abuse, multiple previous hospital admissions for EtOH withdrawal who presented to ED 01/20/21 with hepatic encephalopathy, jaundice, and abd pain. She was admitted to floor but ultimately trasferred to ICU for unstable vital signs and hyperactive delirium c/f DTs. MRI brain 10/30 showed acute/subacute paramedian pontine ischemic infarct. Encephalopathy likely multifactorial in setting of hyperammonemia, hepatitis, aspiration PNA, acute/subacute ischemic pontine  infarct. -MRI brain (10/30):Acute/subacute 9 mm right paramediannonhemorrhagic infarct in the central pons. Symmetric T2 signal changes and restricted diffusion in the thalami bilaterally. This is nonspecific, but may represent artery of Percheron infarctsversus hepatic encephalopathy changes given the striking symmetry of the thalamic lesions - CTA head and neck:No acute intracranial abnormality. A 6 mm left paraclinoid ICA aneurysm. Consult to neuro intervention suggested. No intracranial large vessel occlusion or hemodynamically significant stenosis. Intracranial stenosis described on prior MR angiogram are likely artifactual. Patent major neck arteries without hemodynamically significant stenosis. - EEG (10/31): Findings suggestive of moderate to severe diffuse encephalopathy, nonspecific etiology. No seizures or epileptiform discharges were seen throughout the recording. - MRI brain 11/6 reveals new scattered small volume subarachnoid hemorrhage over both cerebral convexities and small amount of intraventricular hemorrhage dependently within the lateral ventricles. Unchanged size of subacute pontine infarct. Resolved thalamic diffusion abnormality. - MRI cervical spine does not show any spinal cord abnormality. Degenerative changes of the vertebral column are noted.  - The subarachoid blood is most likely secondary to a coagulation disorder in the setting of the patient's liver disease. The patchy distribution does not strongly suggest an aneurysmal bleed. Repeat CTA H&N showed unchanged 16mm L paraclinoid ICA aneurysm. - Repeat head CT 11/7 and 11/9 showed overall stability: Persistent small-volume subarachnoid hemorrhage overlying the bilateral cerebral hemispheres, now predominantly overlying the high right frontal lobe. Redemonstrated small-volume hemorrhage layering within the occipital horns of the lateral ventricles (left greater than right). No significant interval intracranial  hemorrhage is identified. No evidence of acute cortical infarct or hydrocephalus. - Encephalopathy is multifactorial. Main contributor is hepatic encephalopathy with severe hyperammonemia. Subtle thalamic and basal ganglia diffuse signal changes on MRI are compatible with a severe metabolic encephalopathy. Other contributing factors  include alcohol withdrawal, hospital delirium and infection.  -Moderate-severe alcohol withdrawal with delirium  Reproductive/Obstetrics                            Anesthesia Physical  Anesthesia Plan  ASA: 3  Anesthesia Plan: General   Post-op Pain Management:    Induction: Intravenous  PONV Risk Score and Plan: 2 and Treatment may vary due to age or medical condition  Airway Management Planned: Oral ETT  Additional Equipment:   Intra-op Plan:   Post-operative Plan:   Informed Consent: I have reviewed the patients History and Physical, chart, labs and discussed the procedure including the risks, benefits and alternatives for the proposed anesthesia with the patient or authorized representative who has indicated his/her understanding and acceptance.     Consent reviewed with POA and Dental advisory given  Plan Discussed with: CRNA and Anesthesiologist  Anesthesia Plan Comments:        Anesthesia Quick Evaluation

## 2021-02-11 NOTE — Op Note (Signed)
..  02/11/2021 8:11 AM  Leandro Reasoner [id number]  Pre-Op Dx: Respiratory failure  Post-Op Dx:  same  Proc:  Tracheostomy  Surg:  Bud Face  Assistant:  McQueen  Anes:  GOT  EBL:  <39ml  Comp:  none  Findings:  Size 6 Shiley cuffed placed between tracheal ring 2 and 3 without difficulty  Procedure:  The patient was brought from the intensive care unit to the operating room and transferred to an operating table.  Anesthesia was administered per indwelling orotracheal tube.   Neck extension was achieved as possible anda shoulder rolke was placed.  The lower neck was palpated with the findings as described above.  1% Xylocaine with 1:100,000 epinephrine, 9 cc's, was infiltrated into the surgical field for intraoperative hemostasis.  Several minutes were allowed for this to take effect. The patient was prepped in a sterile fashion with a surgical prep from the chin down to the upper chest.  Sterile draping was accomplished in the standard fashion.  A  5 cm horizontal incision was made sharply a finger's breadth above the sternal notch, and extended through skin and subcutaneous fat.  Using cautery, the superficial layer of the deep cervical fascia was lysed.  Additional dissection revealed the strap muscles.  The midline raphe was divided in two layers and the muscles retracted laterally.  The pretracheal plane was visualized.  This was entered bluntly.  The thyroid isthmus was isolated and divided with the Harmonic scalpel.  The thyroid gland was retracted to either side.  The anterior face of the trachea was cleared.  In the  2nd-3rd interspace, a transverse incision was made between cartilage rings into the tracheal lumen.  A 6 mm wide inferiorly based flap was generated and secured to the lower wound with a 4-0 chromic suture.   A previously tested  # 6 Shiley cuffed tracheostomy tube was brought into the field.  With the endotracheal tube under direct visualization through the  tracheostomy, it was gently backed up.  The tracheostomy tube was inserted into the tracheal lumen.  Hemostasis was observed. The cuff was inflated and observed to be intact and containing pressure. The inner cannula was placed and ventilation assumed per tracheostomy tube.  Good chest wall motion was observed, and CO2 was documented per anesthesia.  The trach tube was secured in the standard fashion with trach ties. A 2-0 Nylon suture was used to secure the trach tube to the skin on both sides.  Hemostasis was observed again.  When satisfactory ventilation was assured, the orotracheal tube was removed.  At this point the procedure was completed.  The patient was returned to anesthesia, awakened as possible, and transferred back to the intensive care unit in stable condition.  Comment: 55 y.o. female with prolonged ventilation was the indication for today's procedure.  Anticipate a routine postoperative recovery including standard tracheal hygiene.  The sutures should be removed in 5 days.  When the patient no longer requires ventilator or pressure support, the cuff should be deflated.  Changing to an uncuffed tube and downsizing will be according to the clinical condition of the patient.   Waylynn Benefiel  8:11 AM 02/11/2021

## 2021-02-11 NOTE — Progress Notes (Signed)
NAME:  [name] Carrillo, MRN:  [id number], DOB:  1965/10/14, LOS: 75 ADMISSION DATE:  01/20/2021, CONSULTATION DATE:  01/24/21 REFERRING MD:  Priscella Mann, CHIEF COMPLAINT:  unresponsive   History of Present Illness:  54 yo F wthi hx of MDD, Anxiety disorder, osteomyelitis of left tibia, left tibial fracture history, recurrent UTIs, EtOH abuse and liver Cirrhosis ([place]), active alcoholism, tobacco abuse with multiple readmissions to hospital with EtOH withdrawal syndrome and now being brought into MICU due to unstable vital signs and aggitated hyperactive delerium with concerns for delerium tremens. Blood work is notable for transaminitis with decreased synthetic function.     Patient had ziplock bag with two teeth in there due to loose teeth in mouth. Husband shares that patient has few false teeth that she had created herself.    Ive met with husband and son and explained that patient is unresponsive with GCS7 and is not able to protect airway. They understand the severity of situation and agree for intubation if needed. We discussed goals of care and patient is full code.   Pertinent  Medical History  MDD Anxiety Alcohol use disorder Alcoholic cirrhosis Smoking  Significant Hospital Events: Including procedures, antibiotic start and stop dates in addition to other pertinent events   10/19 Pt admitted to the [facility] unit with delirium tremors secondary to ETOH withdrawal  10/19: CT Head negative  10/19: CT Abd/Pelvis revealed Hepatomegaly with heterogeneous liver enhancement and evidence of portal venous hypertension including recanalized paraumbilical vein, small spleno-renal varices, and small volume ascites. Chronic liver disease strongly suspected although the enhancement pattern might indicate a superimposed acute hepatitis. No evidence of bile duct obstruction. No discrete liver lesion. No obstructive uropathy but absent renal contrast excretion on the delayed images suggesting acute renal  insufficiency. Punctate nephrolithiasis. Borderline to mild cardiomegaly. Bilateral lower lobe atelectasis. Mild aortic atherosclerosis.  10/24 Transfer to ICU, intubation, CVL placed 10/25: Echo revealed EF 60 to 65% 10/28: Pt with hepatic encephalopathy currently undergoing SBT 10/5.  14L positive will administer 40 mg iv lasix x1 dose  10/28: CT Head motion limited exam. Allowing for this, no acute intracranial abnormality. 10/28: CT Maxillofacial revealed prominent dental caries within the residual right maxillary molar. No associated inflammation. No other focal dental or periodontal disease to suggest infection. Minimal inflammatory changes about the lateral right orbit. No abscess. 10/31: CTA Head revealed no acute intracranial abnormality. A 6 mm left paraclinoid ICA aneurysm. Consult to neuro intervention suggested. No intracranial large vessel occlusion or hemodynamically significant stenosis. Intracranial stenosis described on prior MR angiogram are likely artifactual. Patent major neck arteries without hemodynamically significant stenosis. 10/31: MRA Head/Neck revealed medially directed 6 mm aneurysm from the distal left cavernous carotid/ophthalmic segment. Poor visualization of the right cavernous carotid, which may indicate slow flow or stenosis. Consider CTA for further evaluation. Apparent narrowing of the distal basilar artery, which may be artifactual or indicate focal stenosis. No hemodynamically significant stenosis in the neck. No large vessel occlusion. 10/31: EEG suggestive of moderate to severe diffuse encephalopathy, nonspecific etiology. No seizures or epileptiform discharges were seen throughout the recording.  11/1: Pt remains severely encephalopathic despite sedation remaining off since 10/31 '@11' :22 am  11/2: Remains encephalopathic off sedation.  ENT consulted for [facility], family wishes to proceed next week. 11/3: No change in mental status, remains encephalopathic.  Hypernatremia unchanged (150), increase Free Water flushes to 250 cc q4h, hold off on diuresis.  IR to evaluate for Paracentesis 11/5: Opens eyes to voice but not  following commands, plan to diurese today 11/6 remains intubated. MRI Brain reveals new scattered small volume subarachnoid hemorrhage over both cerebral convexities and small amount of intraventricular hemorrhage dependently within the lateral ventricles. Unchanged size of subacute pontine infarct. Resolved thalamic diffusion abnormality. MRI cervical spine does not show any spinal cord abnormality 11/7: More alert today.  Plan for repeat CTH to follow up on subarachnoid hemorrhage.  Plan for diuresis 11/8: Neuro status unchanged. Attempt to exercise respiratory muscles spontaneous mode.  Still remains too weak to protect her airway.  Plan for diuresis 11/9: Neuro status unchanged.  Tolerated SBT for about 2 hours, then exhibiting signs of respiratory muscle fatigue.  Repeat CTH with no new abnormality.  Will proceed with [facility] tomorrow as planned 11/10: TRACH placed (#6 cuffed shiley) by ENT.  Interim History / Subjective:  -No significant events overnight -Afebrile, hemodynamically stable, no vasopressors -TRACH placed this morning by ENT (#6 cuffed shiley), tolerated procedure well -Resting comfortably post procedure -Creatinine stable,  UOP 1.3 L past 24 hrs (net + 1.7 L) -Will diurese with 40 mg IV Lasix x1 dose -Persistent Leukocytosis of 22.3 (20.5) ~ has been treated for UTI, suspected PNA (ABX d/c due to negative culture) and Paracentesis culture negative ~ discussed with Dr. Mortimer Fries, will continue to follow WBC's   Objective   Blood pressure (!) 119/55, pulse (!) 104, temperature 98.8 F (37.1 C), temperature source Axillary, resp. rate (!) 33, height '5\' 5"'  (1.651 m), weight 95 kg, last menstrual period 09/07/2015, SpO2 95 %.    Vent Mode: PRVC FiO2 (%):  [35 %-45 %] 40 % Set Rate:  [20 bmp] 20 bmp Vt Set:  [400 mL] 400  mL PEEP:  [5 cmH20] 5 cmH20   Intake/Output Summary (Last 24 hours) at 02/11/2021 8099 Last data filed at 02/11/2021 8338 Gross per 24 hour  Intake 846 ml  Output 2285 ml  Net -1439 ml    Filed Weights   02/06/21 0451 02/07/21 0421 02/08/21 0500  Weight: 95.3 kg 97.4 kg 95 kg    Examination: General: acutely ill appearing female, mechanically ventilated, resting comfortable post versed administration HENT: Atraumatic, normocephalic, supple, no JVD, new Tracheostomy midline neck is clean, dry, and intact  Lungs: diffuse rhonchi throughout, even, non labored, overbreathing the vent Cardiovascular: Regular rate & rhythm, no R/G, 2+ radial/1+ distal pulses, 2+ generalized edema  Abdomen: +BS x4, soft, obese, abdominal distension present  Extremities: normal bulk and tone, severe generalized weakness Neuro: Sleeping post versed administration, nods to questions, intermittently follows commands but extremely weak, PERRL, icteric sclera  GU: indwelling foley catheter draining yellow urine    Resolved Hospital Problem list     Assessment & Plan:   Acute encephalopathy multifactorial secondary acute/subacute ischemic pontine infarct, hyperammonemia and infectious process ~ improved New scattered small volume subarachnoid hemorrhage over both cerebral convexities and small amount of intraventricular hemorrhage Moderate-severe alcohol withdrawal with delirium ~resolved -Provide supportive care -Neurology following, appreciate input~per neuro aneurysm on CTA Head/Neck and MRA Head Neck can be followed in the outpatient setting  -CIWA protocol -Continue thiamine, folate and multivitamins -Continue lactulose 30 g per tube daily and rifaximin  -Trend ammonia  -ASA/anticoagulation discontinued -Neurology feels the subarachoid blood is most likely secondary to a coagulation disorder in the setting of the patient's liver disease. The patchy distribution does not strongly suggest an aneurysmal  bleed.   -Repeat CTA Head 11/6 showed unchanged 6 mm Left Paraclinoid ICA Aneurysm  -Repeat CTH 11/9 showed overall stability  of subarachnoid hemorrhage   UTI due to E. coli/Klebsiella~completed course of ceftriaxone  Worsening leukocytosis secondary to questionable pneumonia ~ treated Persistent Leukocytosis -Monitor fever curve -Trend WBC's  -Follow cultures as above -Zosyn discontinued (10/29 - 11/1) as follow up Tracheal aspirate from 10/28 with rare normal respiratory flora -Paracentesis culture from 11/3  with no growth  -Discussed with Dr. Mortimer Fries 11/10 regarding persistent Leukocytosis, currently afebrile and normotensive. Was treated for UTI, initially treated for PNA but ABX stopped as cultures were negative, and Paracentesis culture negative ~ will continue to monitor, check Procalcitonin  Acute hypoxic respiratory failure in the setting of Hepatic Encephalopathy and suspected Pneumonia -S/p Tracheostomy 02/11/21 -Full vent support, implement lung protective strategies -Plateau pressures less than 30 cm H20 -Wean FiO2 & PEEP as tolerated to maintain O2 sats >92% -Follow intermittent Chest X-ray & ABG as needed -Spontaneous Breathing Trials/Trach Collar trials as tolerated  -Implement VAP Bundle -Prn Bronchodilators -Diuresis as BP and renal function permits   Acute alcoholic hepatitis Ascites  -Monitor hepatic function panel and bleeding times -S/p paracentesis on 11/3 with removal of 1.5L ~ culture with no growth   Hypernatremia ~ resolved Hypokalemia ~ resolved -Monitor I&O's / urinary output -Follow BMP -Ensure adequate renal perfusion -Avoid nephrotoxic agents as able -Replace electrolytes as indicated -Pharmacy following for assistance with electrolyte replacement -Continue free water flushes to 200 cc q4h  Severe protein calorie malnutrition Dietitian consulted appreciate input~continue tube feeds  Macrocytic anemia due to folate  deficiency Thrombocytopenia Likely driven by liver disease/alcoholism -Monitor for S/Sx of bleeding -Trend CBC -SCD's for VTE Prophylaxis (chemical prophylaxis contraindicated due to subarachnoid hemorrhage) -Transfuse for Hgb <7 -Continue folic acid supplementation    Best Practice (right click and "Reselect all SmartList Selections" daily)   Diet/type: tubefeeds DVT prophylaxis: SCD; (chemical prophylaxis contraindicated due to subarachnoid hemorrhage) GI prophylaxis: PPI Lines: N/A Foley:  Yes and still indicated  Code Status:  full code Last date of multidisciplinary goals of care discussion [02/11/2021]  Updated pt's husband at bedside 02/11/21.  All questions answered.  Critical care time: 38 minutes    Darel Hong, AGACNP-BC [facility] Prefer epic messenger for cross cover needs If after hours, please call E-link

## 2021-02-11 NOTE — Progress Notes (Signed)
Nutrition Follow Up Note   DOCUMENTATION CODES:   Not applicable  INTERVENTION:   Continue Vital 1.5 @ 22m/hr + Pro-Source 439mdaily via tube  Free water flushes 20013m4 hours   Regimen provides 2200kcal/day, 108g/day protein and 2300m37my free water   Juven Fruit Punch BID via tube, each serving provides 95kcal and 2.5g of protein (amino acids glutamine and arginine)  NUTRITION DIAGNOSIS:   Inadequate oral intake related to inability to eat (pt sedated and ventilated) as evidenced by NPO status.  GOAL:   Provide needs based on ASPEN/SCCM guidelines -met with tube feeds   MONITOR:   Vent status, Labs, Weight trends, Skin, I & O's, TF tolerance  ASSESSMENT:   55 y82 female with h/o etoh abuse, anxiety, depression and cirrhosis who is admitted with encephalopathy.  Pt s/p tracheostomy today. NGT placed with side port just below the GE junction; recommend advancement of tube by 3-4 cm. Plan is to restart tube feeds today. Per chart, pt up ~34lbs since admit but appears weight stable over the past week. Pt +1.4L on her I & Os. [facility]   Medications reviewed and include: vitamin C, colace, folic acid, insulin, juven, miralax, thiamine, zinc  Labs reviewed: Na 143 wnl, K 3.8 wnl, BUN 28(H), creat 0.41(L), P 2.8 wnl, Mg 1.8 wnl Wbc- 22.3(H), Hgb 7.9(L), Hct 26.5(L) Cbgs- 109, 153 x 24 hrs  Patient is currently intubated on ventilator support MV: 8.5 L/min Temp (24hrs), Avg:99.1 F (37.3 C), Min:98.4 F (36.9 C), Max:99.7 F (37.6 C)  Propofol: none   MAP- >65mm65mUOP- 1515ml 49met Order:   Diet Order             Diet NPO time specified  Diet effective midnight                  EDUCATION NEEDS:   No education needs have been identified at this time  Skin:  Skin Assessment: Reviewed RN Assessment (ecchymosis, perineal rash, MASD sacrum, incision neck)  Last BM:  11/10- 600ml v67mectal tube  Height:   Ht Readings  from Last 1 Encounters:  01/19/21 '5\' 5"'  (1.651 m)    Weight:   Wt Readings from Last 1 Encounters:  02/08/21 95 kg    Ideal Body Weight:  56.8 kg  BMI:  Body mass index is 34.85 kg/m.  Estimated Nutritional Needs:   Kcal:  1800-2100kcal/day  Protein:  > 110 grams  Fluid:  1.7-2.0L/day  Jaynie Hitch CKoleen Distance, LDN Please refer to [facility] and/or RD on-call/weekend/after hours pager

## 2021-02-11 NOTE — Transfer of Care (Signed)
Immediate Anesthesia Transfer of Care Note  Patient: [name] Carrillo  Procedure(s) Performed: TRACHEOSTOMY  Patient Location: ICU  Anesthesia Type:General  Level of Consciousness: sedated  Airway & Oxygen Therapy: Patient remains intubated per anesthesia plan and Patient placed on Ventilator (see vital sign flow sheet for setting)  Post-op Assessment: Report given to RN and Post -op Vital signs reviewed and stable  Post vital signs: Reviewed  Last Vitals:  Vitals Value Taken Time  BP    Temp    Pulse 88 02/11/21 0832  Resp 20 02/11/21 0832  SpO2 88 % 02/11/21 0832  Vitals shown include unvalidated device data.  Last Pain:  Vitals:   02/11/21 0400  TempSrc: Axillary  PainSc:       Patients Stated Pain Goal: 0 (01/24/21 0900)  Complications: No notable events documented.

## 2021-02-11 NOTE — Progress Notes (Signed)
PHARMACY CONSULT NOTE - FOLLOW UP  Pharmacy Consult for Electrolyte Monitoring and Replacement   Recent Labs: Potassium (mmol/L)  Date Value  02/11/2021 3.8  04/28/2012 3.9   Magnesium (mg/dL)  Date Value  62/70/3500 1.8   Calcium (mg/dL)  Date Value  93/81/8299 8.5 (L)   Calcium, Total (mg/dL)  Date Value  37/16/9678 8.3 (L)   Albumin (g/dL)  Date Value  93/81/0175 2.1 (L)   Phosphorus (mg/dL)  Date Value  01/26/8526 2.8   Sodium (mmol/L)  Date Value  02/11/2021 143  04/28/2012 136     Assessment: 55 year old female with history of alcohol use and liver cirrhosis. Patient transferred to the ICU 10/24 and intubated; remains on mechanical ventilation off sedation. MRI brain with new small volume subarachnoid hemorrhage. Plan for tracheostomy 11/10. Patient has been receiving intermittent diuresis. Pharmacy consult for electrolyte management.  Goal of Therapy:  Electrolytes WNL  Plan:  --No replacement indicated today --Recheck electrolytes with morning labs  Pricilla Riffle, PharmD, BCPS Clinical Pharmacist 02/11/2021 11:26 AM

## 2021-02-11 NOTE — TOC Progression Note (Signed)
Transition of Care [facility]) - Progression Note    Patient Details  Name: TELINA KLECKLEY MRN: [id number] Date of Birth: 1965-07-23  Transition of Care [facility]) CM/SW Contact  Hetty Ely, RN Phone Number: 02/11/2021, 11:30 AM  Clinical Narrative: Attending says patient would benefit from LTAC, [facility] called has a bed. Spoke with husband, Casimiro Needle who consents to [employer]. [facility] did visit with husband to discuss the facility and care process. [facility] will start the authorization process.     Expected Discharge Plan: IP Rehab Facility Barriers to Discharge: Continued Medical Work up  Expected Discharge Plan and Services Expected Discharge Plan: IP Rehab Facility     Post Acute Care Choice: NA Living arrangements for the past 2 months: Single Family Home                                       Social Determinants of Health (SDOH) Interventions    Readmission Risk Interventions Readmission Risk Prevention Plan 02/05/2021  Transportation Screening Complete  PCP or Specialist Appt within 5-7 Days Not Complete  Not Complete comments NMS for Discharge  Home Care Screening Complete  Medication Review (RN CM) Complete  Some recent data might be hidden

## 2021-02-11 NOTE — Progress Notes (Signed)
Advanced NG by 4 cm per DR Belia Heman.

## 2021-02-11 NOTE — Anesthesia Postprocedure Evaluation (Signed)
Anesthesia Post Note  Patient: [name] Carrillo  Procedure(s) Performed: TRACHEOSTOMY  Patient location during evaluation: ICU Anesthesia Type: General Level of consciousness: sedated Pain management: pain level controlled Vital Signs Assessment: post-procedure vital signs reviewed and stable Respiratory status: nonlabored ventilation and patient on ventilator - see flowsheet for VS Cardiovascular status: blood pressure returned to baseline and stable Postop Assessment: no apparent nausea or vomiting Anesthetic complications: no   No notable events documented.   Last Vitals:  Vitals:   02/11/21 1200 02/11/21 1300  BP: (!) 95/50 (!) 96/51  Pulse: 87 83  Resp: 20 (!) 21  Temp: 37.5 C   SpO2: 93% 93%    Last Pain:  Vitals:   02/11/21 0400  TempSrc: Axillary  PainSc:                  Foye Deer

## 2021-02-12 ENCOUNTER — Inpatient Hospital Stay: Payer: BC Managed Care – PPO

## 2021-02-12 DIAGNOSIS — Z978 Presence of other specified devices: Secondary | ICD-10-CM

## 2021-02-12 DIAGNOSIS — Z930 Tracheostomy status: Secondary | ICD-10-CM

## 2021-02-12 DIAGNOSIS — G9341 Metabolic encephalopathy: Secondary | ICD-10-CM | POA: Diagnosis not present

## 2021-02-12 DIAGNOSIS — Z9911 Dependence on respirator [ventilator] status: Secondary | ICD-10-CM

## 2021-02-12 LAB — COMPREHENSIVE METABOLIC PANEL
ALT: 64 U/L — ABNORMAL HIGH (ref 0–44)
AST: 109 U/L — ABNORMAL HIGH (ref 15–41)
Albumin: 2.2 g/dL — ABNORMAL LOW (ref 3.5–5.0)
Alkaline Phosphatase: 140 U/L — ABNORMAL HIGH (ref 38–126)
Anion gap: 7 (ref 5–15)
BUN: 35 mg/dL — ABNORMAL HIGH (ref 6–20)
CO2: 33 mmol/L — ABNORMAL HIGH (ref 22–32)
Calcium: 8.7 mg/dL — ABNORMAL LOW (ref 8.9–10.3)
Chloride: 104 mmol/L (ref 98–111)
Creatinine, Ser: 0.47 mg/dL (ref 0.44–1.00)
GFR, Estimated: >60 mL/min (ref >60)
Glucose, Bld: 190 mg/dL — ABNORMAL HIGH (ref 70–99)
Potassium: 4 mmol/L (ref 3.5–5.1)
Sodium: 144 mmol/L (ref 135–145)
Total Bilirubin: 2.7 mg/dL — ABNORMAL HIGH (ref 0.3–1.2)
Total Protein: 6.6 g/dL (ref 6.5–8.1)

## 2021-02-12 LAB — CBC WITH DIFFERENTIAL/PLATELET
Abs Immature Granulocytes: 0.18 10*3/uL — ABNORMAL HIGH (ref 0.00–0.07)
Basophils Absolute: 0 10*3/uL (ref 0.0–0.1)
Basophils Relative: 0 %
Eosinophils Absolute: 0 10*3/uL (ref 0.0–0.5)
Eosinophils Relative: 0 %
HCT: 26.2 % — ABNORMAL LOW (ref 36.0–46.0)
Hemoglobin: 7.9 g/dL — ABNORMAL LOW (ref 12.0–15.0)
Immature Granulocytes: 1 %
Lymphocytes Relative: 6 %
Lymphs Abs: 1.3 10*3/uL (ref 0.7–4.0)
MCH: 38.3 pg — ABNORMAL HIGH (ref 26.0–34.0)
MCHC: 30.2 g/dL (ref 30.0–36.0)
MCV: 127.2 fL — ABNORMAL HIGH (ref 80.0–100.0)
Monocytes Absolute: 2 10*3/uL — ABNORMAL HIGH (ref 0.1–1.0)
Monocytes Relative: 9 %
Neutro Abs: 19 10*3/uL — ABNORMAL HIGH (ref 1.7–7.7)
Neutrophils Relative %: 84 %
Platelets: 206 10*3/uL (ref 150–400)
RBC: 2.06 MIL/uL — ABNORMAL LOW (ref 3.87–5.11)
RDW: 25.1 % — ABNORMAL HIGH (ref 11.5–15.5)
WBC: 22.5 10*3/uL — ABNORMAL HIGH (ref 4.0–10.5)
nRBC: 0 % (ref 0.0–0.2)

## 2021-02-12 LAB — GLUCOSE, CAPILLARY
Glucose-Capillary: 190 mg/dL — ABNORMAL HIGH (ref 70–99)
Glucose-Capillary: 170 mg/dL — ABNORMAL HIGH (ref 70–99)
Glucose-Capillary: 135 mg/dL — ABNORMAL HIGH (ref 70–99)
Glucose-Capillary: 149 mg/dL — ABNORMAL HIGH (ref 70–99)
Glucose-Capillary: 161 mg/dL — ABNORMAL HIGH (ref 70–99)
Glucose-Capillary: 149 mg/dL — ABNORMAL HIGH (ref 70–99)

## 2021-02-12 LAB — PROCALCITONIN: Procalcitonin: 0.31 ng/mL

## 2021-02-12 LAB — PHOSPHORUS: Phosphorus: 3.3 mg/dL (ref 2.5–4.6)

## 2021-02-12 LAB — MAGNESIUM: Magnesium: 1.9 mg/dL (ref 1.7–2.4)

## 2021-02-12 IMAGING — DX DG ABDOMEN 1V
1 series · 2 of 2 positions shown · non-contrast
Comparison: Abdominal x-ray [DATE]

CLINICAL DATA: NG tube placement

EXAM:
ABDOMEN - 1 VIEW

[Series 1: abdomen supine · 0.14mm/px · 2 of 2 slices shown]
[im 1/2]
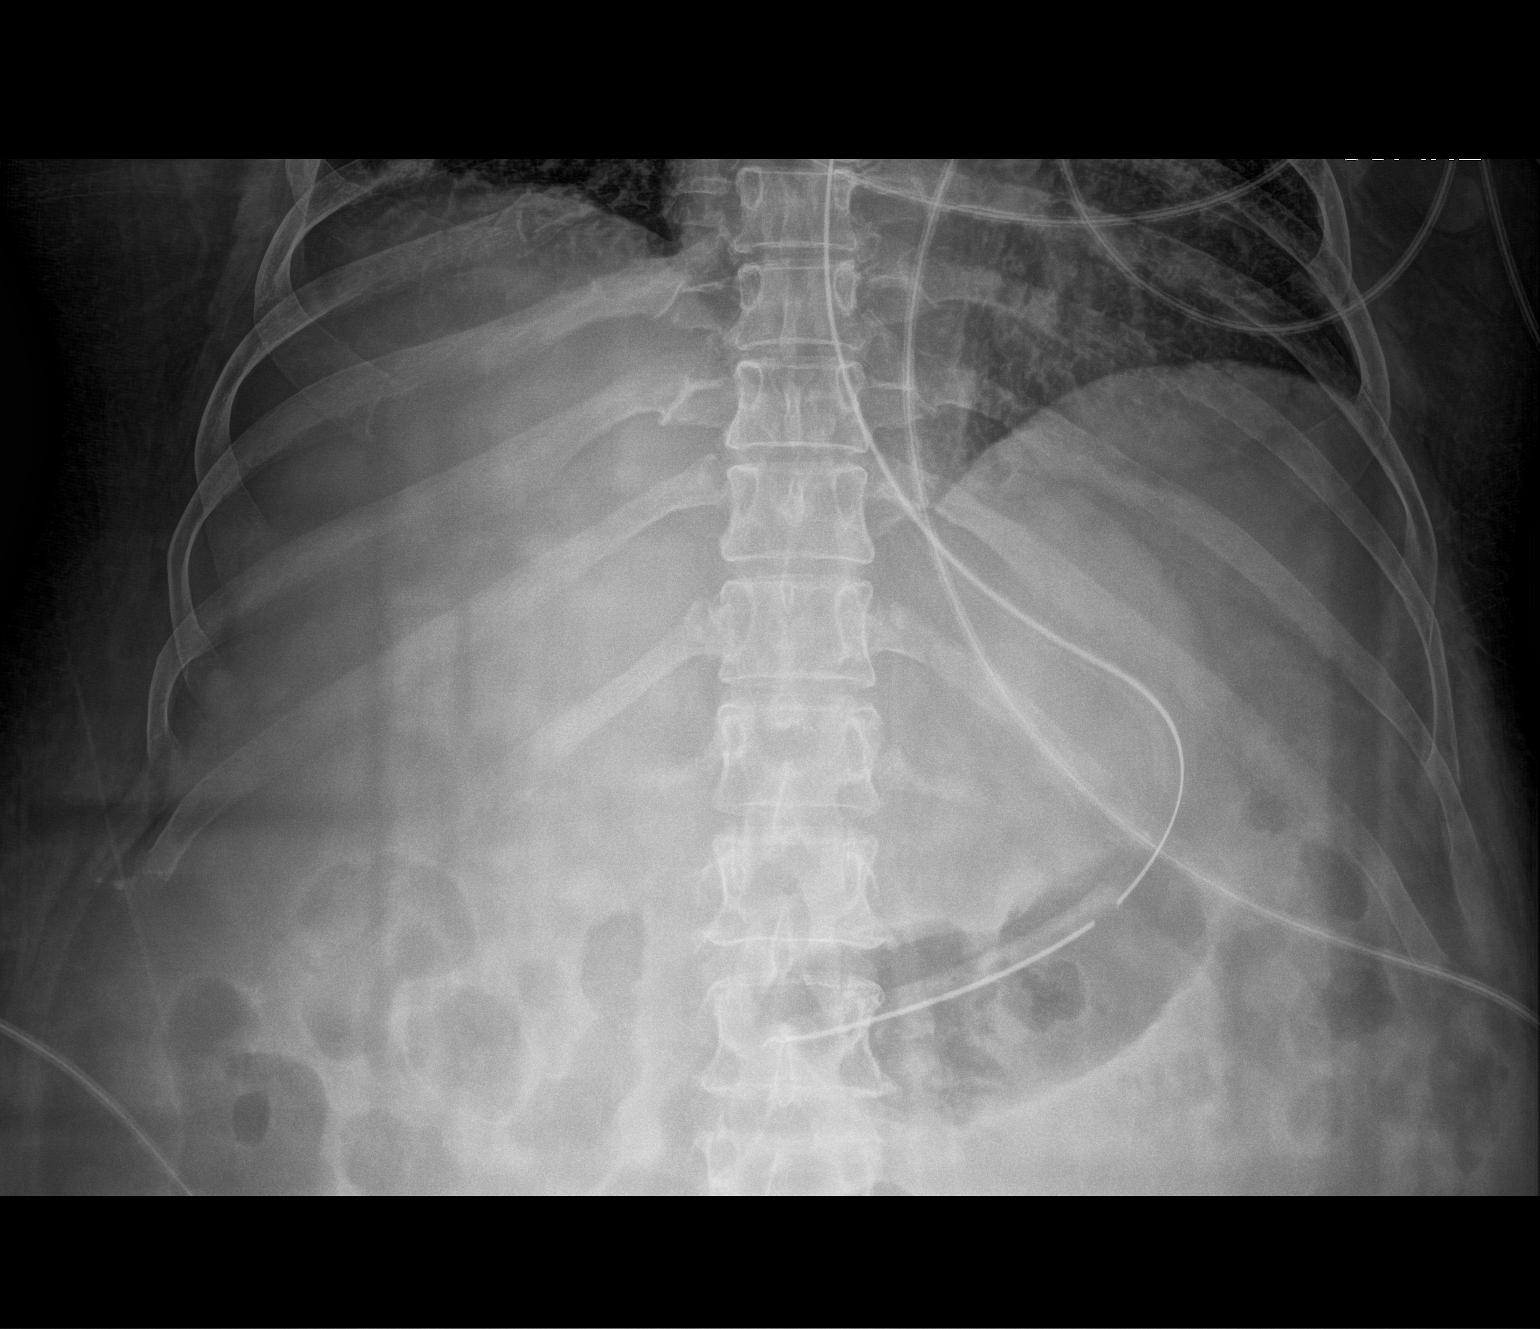
[im 2/2]
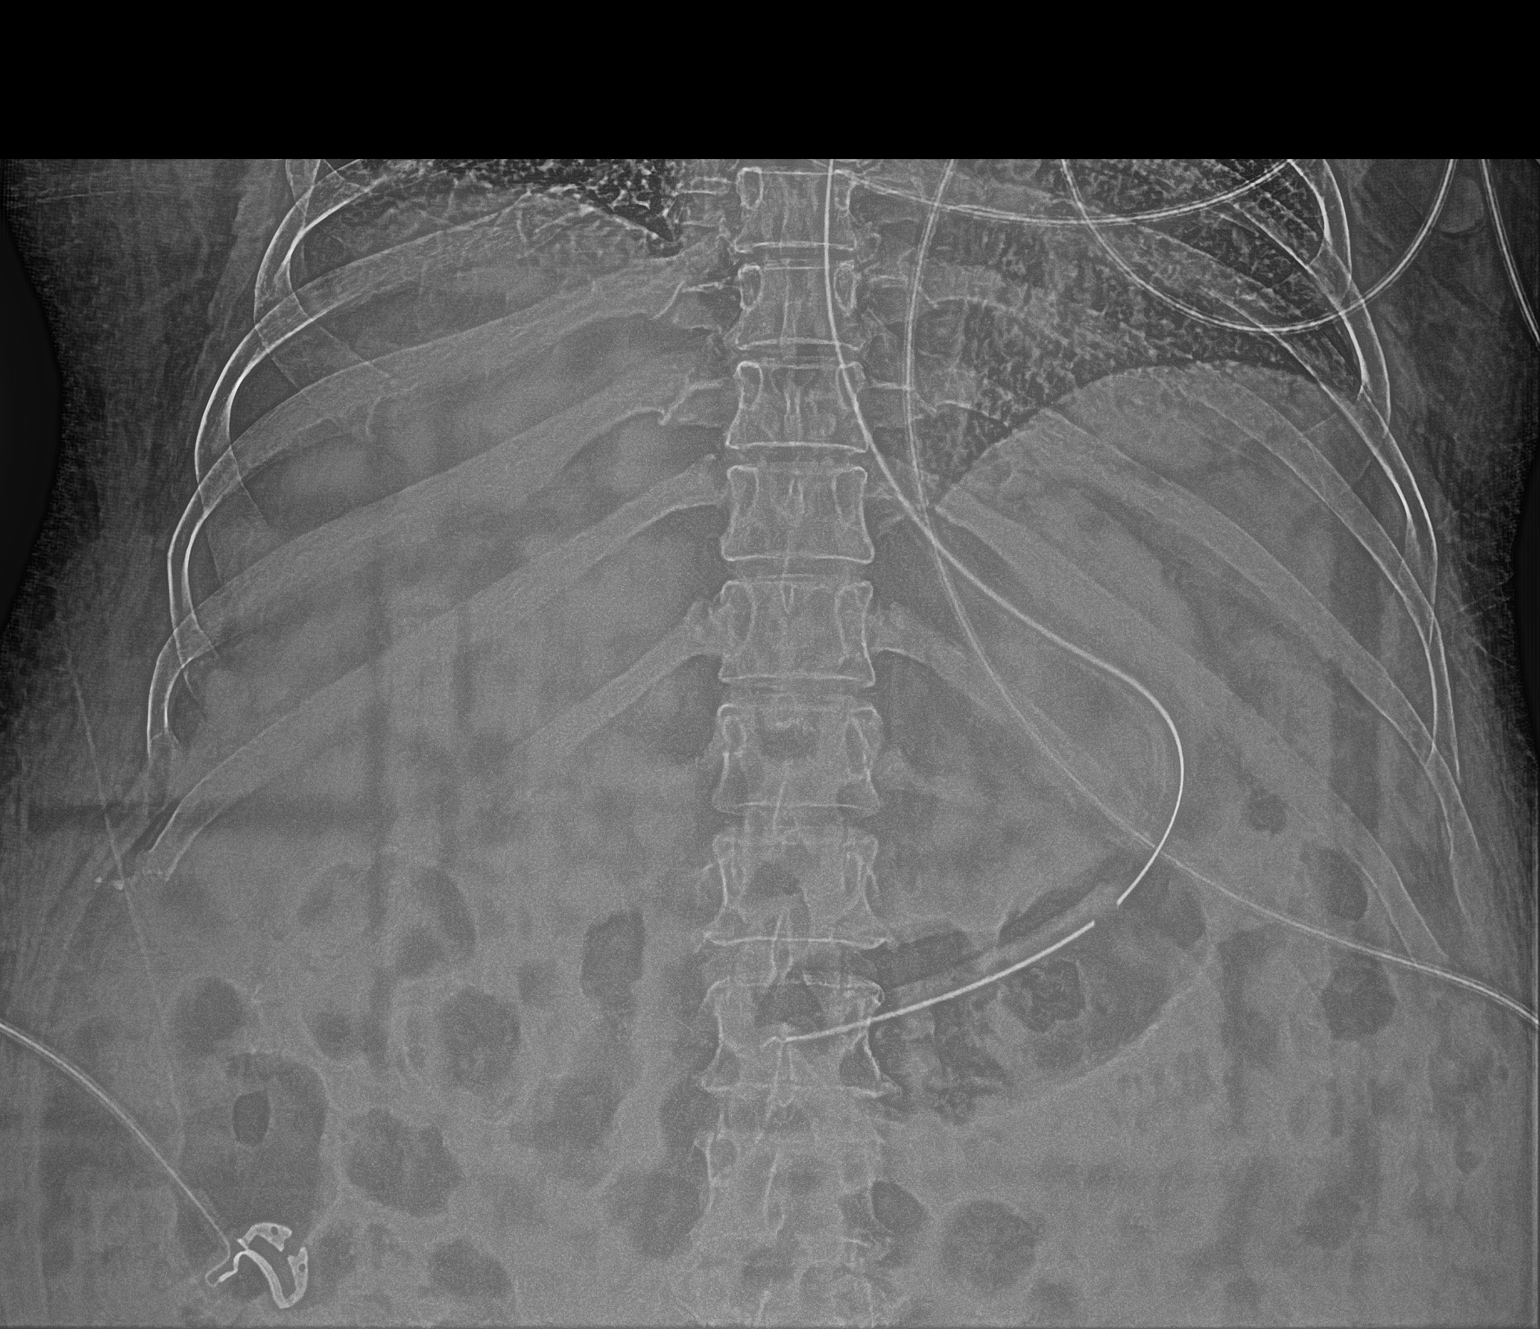

[2 of 2 positions shown; findings below may reference images not displayed]

FINDINGS: Nasogastric tube tip is in the stomach. Bowel gas pattern appears
nonobstructive, limited visualization.
IMPRESSION: Nasogastric tube tip in the stomach.

## 2021-02-12 MED ORDER — OXYCODONE HCL 5 MG/5ML PO SOLN
5.0000 mg | ORAL | Status: DC | PRN
  Administered 2021-02-12: 5 mg
  Filled 2021-02-12: qty 5

## 2021-02-12 MED ORDER — CLONAZEPAM 1 MG PO TABS
1.0000 mg | ORAL_TABLET | Freq: Two times a day (BID) | ORAL | Status: DC | PRN
Start: 2021-02-12 — End: 2021-02-13
  Administered 2021-02-13: 1 mg
  Filled 2021-02-12: qty 1

## 2021-02-12 MED ORDER — OXYCODONE HCL 5 MG/5ML PO SOLN: 10.0000 mg | ORAL | Status: DC | PRN

## 2021-02-12 MED ORDER — CLONAZEPAM 0.5 MG PO TABS
0.5000 mg | ORAL_TABLET | Freq: Two times a day (BID) | ORAL | Status: DC | PRN
  Administered 2021-02-12: 0.5 mg
  Filled 2021-02-12: qty 1

## 2021-02-12 NOTE — TOC Progression Note (Signed)
Transition of Care [facility]) - Progression Note    Patient Details  Name: [name] Carrillo MRN: [id number] Date of Birth: 1965/11/21  [facility]) CM/SW Contact  Joseph Art, [place] Phone Number: 02/12/2021, 3:42 PM  Clinical Narrative:     Patient pending insurance auth for [facility] LTACH placement.  Peer to Peer on November 14th, 2022 at 2:00PM.  Expected Discharge Plan: IP Rehab Facility Barriers to Discharge: Continued Medical Work up  Expected Discharge Plan and Services Expected Discharge Plan: IP Rehab Facility     Post Acute Care Choice: NA Living arrangements for the past 2 months: Single Family Home                                       Social Determinants of Health (SDOH) Interventions    Readmission Risk Interventions Readmission Risk Prevention Plan 02/05/2021  Transportation Screening Complete  PCP or Specialist Appt within 5-7 Days Not Complete  Not Complete comments NMS for Discharge  Home Care Screening Complete  Medication Review (RN CM) Complete  Some recent data might be hidden

## 2021-02-12 NOTE — Progress Notes (Signed)
NAME:  [name] Carrillo, MRN:  [id number], DOB:  April 09, 1965, LOS: 65 ADMISSION DATE:  01/20/2021, CONSULTATION DATE:  01/24/21 REFERRING MD:  Priscella Mann, CHIEF COMPLAINT:  unresponsive   History of Present Illness:  55 yo F wthi hx of MDD, Anxiety disorder, osteomyelitis of left tibia, left tibial fracture history, recurrent UTIs, EtOH abuse and liver Cirrhosis ([place]), active alcoholism, tobacco abuse with multiple readmissions to hospital with EtOH withdrawal syndrome and now being brought into MICU due to unstable vital signs and aggitated hyperactive delerium with concerns for delerium tremens. Blood work is notable for transaminitis with decreased synthetic function.     Patient had ziplock bag with two teeth in there due to loose teeth in mouth. Husband shares that patient has few false teeth that she had created herself.    Ive met with husband and son and explained that patient is unresponsive with GCS7 and is not able to protect airway. They understand the severity of situation and agree for intubation if needed. We discussed goals of care and patient is full code.   Pertinent  Medical History  MDD Anxiety Alcohol use disorder Alcoholic cirrhosis Smoking  Significant Hospital Events: Including procedures, antibiotic start and stop dates in addition to other pertinent events   10/19 Pt admitted to the [facility] unit with delirium tremors secondary to ETOH withdrawal  10/19: CT Head negative  10/19: CT Abd/Pelvis revealed Hepatomegaly with heterogeneous liver enhancement and evidence of portal venous hypertension including recanalized paraumbilical vein, small spleno-renal varices, and small volume ascites. Chronic liver disease strongly suspected although the enhancement pattern might indicate a superimposed acute hepatitis. No evidence of bile duct obstruction. No discrete liver lesion. No obstructive uropathy but absent renal contrast excretion on the delayed images suggesting acute renal  insufficiency. Punctate nephrolithiasis. Borderline to mild cardiomegaly. Bilateral lower lobe atelectasis. Mild aortic atherosclerosis.  10/24 Transfer to ICU, intubation, CVL placed 10/25: Echo revealed EF 60 to 65% 10/28: Pt with hepatic encephalopathy currently undergoing SBT 10/5.  14L positive will administer 40 mg iv lasix x1 dose  10/28: CT Head motion limited exam. Allowing for this, no acute intracranial abnormality. 10/28: CT Maxillofacial revealed prominent dental caries within the residual right maxillary molar. No associated inflammation. No other focal dental or periodontal disease to suggest infection. Minimal inflammatory changes about the lateral right orbit. No abscess. 10/31: CTA Head revealed no acute intracranial abnormality. A 6 mm left paraclinoid ICA aneurysm. Consult to neuro intervention suggested. No intracranial large vessel occlusion or hemodynamically significant stenosis. Intracranial stenosis described on prior MR angiogram are likely artifactual. Patent major neck arteries without hemodynamically significant stenosis. 10/31: MRA Head/Neck revealed medially directed 6 mm aneurysm from the distal left cavernous carotid/ophthalmic segment. Poor visualization of the right cavernous carotid, which may indicate slow flow or stenosis. Consider CTA for further evaluation. Apparent narrowing of the distal basilar artery, which may be artifactual or indicate focal stenosis. No hemodynamically significant stenosis in the neck. No large vessel occlusion. 10/31: EEG suggestive of moderate to severe diffuse encephalopathy, nonspecific etiology. No seizures or epileptiform discharges were seen throughout the recording.  11/1: Pt remains severely encephalopathic despite sedation remaining off since 10/31 _0 :22 am  11/2: Remains encephalopathic off sedation.  ENT consulted for [facility], family wishes to proceed next week. 11/3: No change in mental status, remains encephalopathic.  Hypernatremia unchanged (150), increase Free Water flushes to 250 cc q4h, hold off on diuresis.  IR to evaluate for Paracentesis 11/5: Opens eyes to voice but not  following commands, plan to diurese today 11/6 remains intubated. MRI Brain reveals new scattered small volume subarachnoid hemorrhage over both cerebral convexities and small amount of intraventricular hemorrhage dependently within the lateral ventricles. Unchanged size of subacute pontine infarct. Resolved thalamic diffusion abnormality. MRI cervical spine does not show any spinal cord abnormality 11/7: More alert today.  Plan for repeat CTH to follow up on subarachnoid hemorrhage.  Plan for diuresis 11/8: Neuro status unchanged. Attempt to exercise respiratory muscles spontaneous mode.  Still remains too weak to protect her airway.  Plan for diuresis 11/9: Neuro status unchanged.  Tolerated SBT for about 2 hours, then exhibiting signs of respiratory muscle fatigue.  Repeat CTH with no new abnormality.  Will proceed with [facility] tomorrow as planned 11/10: TRACH placed (#6 cuffed shiley) by ENT. 11/11: More awake and following commands (extremely weak).  PT/OT consulted. Search for [facility] placement in progress. Intermittent anxiety/restlessness ~ add oxycodone and Clonazepam  Interim History / Subjective:  -No significant events overnight -Afebrile, hemodynamically stable, no vasopressors -TRACH placed yesterday, no complications -On PS mode on vent -Pt with intermittent anxiety, nods yes to pain ~ will add PRN Clonazepam and Oxycodone via tube -Creatinine stable,  UOP 960cc yesterday with diuresis (net - 810 cc since admit) -Persistent Leukocytosis of 22.5 (22.3)~ has been treated for UTI, suspected PNA (ABX d/c due to negative culture) and Paracentesis culture negative ~ Procalcitonin is trending down ~ discussed with Dr. Patsey Berthold, will continue to monitor for now -PT/OT consult   Objective   Blood pressure 111/64, pulse 97,  temperature 98 F (36.7 C), temperature source Axillary, resp. rate (!) 27, height _0  (1.651 m), weight 91 kg, last menstrual period 09/07/2015, SpO2 96 %.    Vent Mode: PRVC FiO2 (%):  [30 %-45 %] 30 % Set Rate:  [20 bmp] 20 bmp Vt Set:  [400 mL] 400 mL PEEP:  [5 cmH20] 5 cmH20 Pressure Support:  [8 cmH20] 8 cmH20   Intake/Output Summary (Last 24 hours) at 02/12/2021 2130 Last data filed at 02/12/2021 0400 Gross per 24 hour  Intake --  Output 1060 ml  Net -1060 ml    Filed Weights   02/07/21 0421 02/08/21 0500 02/12/21 0312  Weight: 97.4 kg 95 kg 91 kg    Examination: General: acutely ill appearing female, mechanically ventilated via tracheostomy,  intermittent anxiety but NAD HENT: Atraumatic, normocephalic, supple, no JVD, new Tracheostomy midline neck is clean, dry, and intact  Lungs: diffuse rhonchi throughout, even, non labored, on PS mode on vent Cardiovascular: Regular rate & rhythm, no R/G, 2+ radial/1+ distal pulses, 2+ generalized edema  Abdomen: +BS x4, soft, obese, abdominal distension present  Extremities: normal bulk and tone, severe generalized weakness Neuro: Awake and alert (unable to assess orientation due to trach) nods to questions, follows commands but extremely weak, no focal deficits, PERRL, icteric sclera  GU: indwelling foley catheter draining yellow urine    Resolved Hospital Problem list     Assessment & Plan:   Acute encephalopathy multifactorial secondary acute/subacute ischemic pontine infarct, hyperammonemia and infectious process ~ improving New scattered small volume subarachnoid hemorrhage over both cerebral convexities and small amount of intraventricular hemorrhage Moderate-severe alcohol withdrawal with delirium ~resolved -Provide supportive care -Frequent neuro exams -Neurology following, appreciate input~per neuro aneurysm on CTA Head/Neck and MRA Head Neck can be followed in the outpatient setting  -CIWA protocol as  needed -Continue thiamine, folate and multivitamins -Continue lactulose 30 g per tube daily and rifaximin  -Trend ammonia  -  ASA/anticoagulation discontinued -Neurology feels the subarachoid blood is most likely secondary to a coagulation disorder in the setting of the patient's liver disease. The patchy distribution does not strongly suggest an aneurysmal bleed.   -Repeat CTA Head 11/6 showed unchanged 6 mm Left Paraclinoid ICA Aneurysm  -Repeat CTH 11/9 showed overall stability of subarachnoid hemorrhage   UTI due to E. coli/Klebsiella~completed course of ceftriaxone  Worsening leukocytosis secondary to questionable pneumonia ~ treated Persistent Leukocytosis -Monitor fever curve -Trend WBC's & Procalcitonin -Follow cultures as above -Zosyn discontinued (10/29 - 11/1) as follow up Tracheal aspirate from 10/28 with rare normal respiratory flora -Paracentesis culture from 11/3  with no growth  -Persistent Leukocytosis, currently afebrile and normotensive. Was treated for UTI, initially treated for PNA but ABX stopped as cultures were negative, and Paracentesis culture negative ~ Procalcitonin continues to trend down ~ Discussed with Dr. Patsey Berthold, will continue to monitor  Acute hypoxic respiratory failure in the setting of Hepatic Encephalopathy and suspected Pneumonia -S/p Tracheostomy 02/11/21 -Full vent support, implement lung protective strategies -Plateau pressures less than 30 cm H20 -Wean FiO2 & PEEP as tolerated to maintain O2 sats >92% -Follow intermittent Chest X-ray & ABG as needed -Spontaneous Breathing Trials/Trach Collar trials as tolerated  -Implement VAP Bundle -Prn Bronchodilators -Diuresis as BP and renal function permits   Acute alcoholic hepatitis Ascites  -Monitor hepatic function panel and bleeding times -S/p paracentesis on 11/3 with removal of 1.5L ~ culture with no growth   Hypernatremia ~ resolved Hypokalemia ~ resolved -Monitor I&O's / urinary  output -Follow BMP -Ensure adequate renal perfusion -Avoid nephrotoxic agents as able -Replace electrolytes as indicated -Pharmacy following for assistance with electrolyte replacement -Continue free water flushes to 200 cc q4h  Severe protein calorie malnutrition Dietitian consulted appreciate input~continue tube feeds  Macrocytic anemia due to folate deficiency Thrombocytopenia Likely driven by liver disease/alcoholism -Monitor for S/Sx of bleeding -Trend CBC -SCD's for VTE Prophylaxis (chemical prophylaxis contraindicated due to subarachnoid hemorrhage) -Transfuse for Hgb <7 -Continue folic acid supplementation    Best Practice (right click and "Reselect all SmartList Selections" daily)   Diet/type: tubefeeds DVT prophylaxis: SCD; (chemical prophylaxis contraindicated due to subarachnoid hemorrhage) GI prophylaxis: PPI Lines: N/A Foley:  Yes and still indicated  Code Status:  full code Last date of multidisciplinary goals of care discussion [02/12/2021]  Updated pt's husband at bedside.  All questions answered.  Critical care time: 38 minutes    Darel Hong, AGACNP-BC [facility] Prefer epic messenger for cross cover needs If after hours, please call E-link

## 2021-02-12 NOTE — Evaluation (Addendum)
Physical Therapy Evaluation Patient Details Name: [name] Carrillo MRN: [id number] DOB: Apr 04, 1966 Today's Date: 02/12/2021  History of Present Illness  [name] Carrillo is a 55 yo F with hx of MDD, Anxiety disorder, osteomyelitis of left tibia, Hx of left tibial fracture, recurrent UTIs, EtOH abuse and liver Cirrhosis ([facility]), active alcoholism, tobacco abuse with multiple readmissions. Pt presented to ED on 01/20/21 with complaints of jaundice, AMS, and concern for aspiration pneumonia. Complicated medical course significant for ETOH withdrawl, t/f to CCU with intubation, hepatomegaly, 6 mm left paraclinoid ICA aneurysm, small volume subarachnoid hemorrhage over both cerebral convexities and small amount of intraventricular hemorrhage, & TRACH placement on 02/11/21.  Clinical Impression  Pt sedated, does not arouse to noxious stimuli, but does intermittently open eyes and maintaining an awake state through ~ 40% of therapy, JFK score of 4. Per chart PLOF was indep for ADLs and mobility. Pt unable to follow commands but does provide small reciprocal response to hip abduction during PROM activity and was able to lift her head slightly during bed mobility. Pt repositioned in bed w/ total A and seated in chair position for PROM activity but pt remained lethargic. Pt remains on vent and all lines/leads in place following treatment session. PT to continue addressing mobility impairments during hospitalization pending ability to follow commands. PT to trial 3-5 sessions pending pt's ability to participate. LTACH primary discharge recommendation at this time. Skilled PT intervention is indicated to address deficits in function, mobility, and to return to PLOF as able.       Recommendations for follow up therapy are one component of a multi-disciplinary discharge planning process, led by the attending physician.  Recommendations may be updated based on patient status, additional functional criteria and insurance  authorization.  Follow Up Recommendations PT at Long-term acute care hospital    Assistance Recommended at Discharge Frequent or constant Supervision/Assistance  Functional Status Assessment Patient has had a recent decline in their functional status and demonstrates the ability to make significant improvements in function in a reasonable and predictable amount of time.  Equipment Recommendations  None recommended by PT    Recommendations for Other Services       Precautions / Restrictions Precautions Precautions: Fall Restrictions Weight Bearing Restrictions: No      Mobility  Bed Mobility Overal bed mobility: Needs Assistance Bed Mobility: Rolling Rolling: +2 for safety/equipment;Total assist;+2 for physical assistance              Transfers                   General transfer comment: pt not appropriate at this time    Ambulation/Gait                  Stairs            Wheelchair Mobility    Modified Rankin (Stroke Patients Only)       Balance Overall balance assessment: Needs assistance   Sitting balance-Leahy Scale: Zero Sitting balance - Comments: Slides out of bed     Standing balance-Leahy Scale: Zero                               Pertinent Vitals/Pain Pain Assessment: Faces Breathing: normal Negative Vocalization: none Facial Expression: sad, frightened, frown Body Language: relaxed Consolability: no need to console PAINAD Score: 1 Facial Expression: Tense Body Movements: Absence of movements Muscle Tension: Tense, rigid Compliance with  ventilator (intubated pts.): N/A Vocalization (extubated pts.): N/A CPOT Total: 2 Pain Intervention(s): Monitored during session;Limited activity within patient's tolerance;Repositioned    Home Living Family/patient expects to be discharged to:: Private residence Living Arrangements: Spouse/significant other;Children Available Help at Discharge: Family Type of Home:  House             Additional Comments: Pt unable to converse or provide info on PLOF or home-setup w/o family present. Per chart review pt was indep w/ mobility and ADLs and was working    Prior Function Prior Level of Function : Independent/Modified Independent;Working/employed;Driving;Patient poor historian/Family not available                     Hand Dominance        Extremity/Trunk Assessment   Upper Extremity Assessment Upper Extremity Assessment: Difficult to assess due to impaired cognition    Lower Extremity Assessment Lower Extremity Assessment: Difficult to assess due to impaired cognition       Communication   Communication: Tracheostomy  Cognition Arousal/Alertness: Lethargic Behavior During Therapy: WFL for tasks assessed/performed Overall Cognitive Status: Impaired/Different from baseline Area of Impairment: JFK Recovery Scale Auditory: None Visual: Visual Startle Motor: Flexion Withdrawl Oromotor/Verbal: None Communication: None Arousal: Eye opening with stimulation Total Score: 4                 General Comments: able to follow commands x 2 for LLE movement        General Comments General comments (skin integrity, edema, etc.): L hand/wrist edema, trach, foley NGT in place    Exercises General Exercises - Lower Extremity Ankle Circles/Pumps: PROM;Both;10 reps;Supine Long Arc Quad: PROM;15 reps;Both;Seated Hip ABduction/ADduction: PROM;Both;10 reps;Supine Other Exercises Other Exercises: OT/PT facilitate bed mobility and repositioning to maximize safety/skin integrity.   Assessment/Plan    PT Assessment Patient needs continued PT services  PT Problem List Decreased strength;Decreased mobility;Decreased safety awareness;Decreased range of motion;Decreased coordination;Decreased knowledge of precautions;Decreased cognition;Decreased activity tolerance;Decreased balance;Decreased knowledge of use of DME;Impaired tone       PT  Treatment Interventions Therapeutic activities;DME instruction;Gait training;Therapeutic exercise;Stair training;Balance training;Neuromuscular re-education;Functional mobility training    PT Goals (Current goals can be found in the Care Plan section)  Acute Rehab PT Goals Patient Stated Goal: non stated    Frequency Min 2X/week; trial PT to continue to assess pt's ability to participate.   Barriers to discharge        Co-evaluation PT/OT/SLP Co-Evaluation/Treatment: Yes Reason for Co-Treatment: Complexity of the patient's impairments (multi-system involvement);For patient/therapist safety;To address functional/ADL transfers PT goals addressed during session: Mobility/safety with mobility;Strengthening/ROM OT goals addressed during session: ADL's and self-care;Strengthening/ROM       AM-PAC PT "6 Clicks" Mobility  Outcome Measure Help needed turning from your back to your side while in a flat bed without using bedrails?: Total Help needed moving from lying on your back to sitting on the side of a flat bed without using bedrails?: Total Help needed moving to and from a bed to a chair (including a wheelchair)?: Total Help needed standing up from a chair using your arms (e.g., wheelchair or bedside chair)?: Total Help needed to walk in hospital room?: Total Help needed climbing 3-5 steps with a railing? : Total 6 Click Score: 6    End of Session Equipment Utilized During Treatment: Oxygen Activity Tolerance: Patient limited by lethargy Patient left: in bed;with call bell/phone within reach Nurse Communication: Mobility status PT Visit Diagnosis: Muscle weakness (generalized) (M62.81);Other symptoms and signs involving the  nervous system (R29.898);Other abnormalities of gait and mobility (R26.89)    Time:  -   5809 - 1112   Charges:              Hosie Spangle, SPT

## 2021-02-12 NOTE — Evaluation (Signed)
Occupational Therapy Evaluation Patient Details Name: [name] Carrillo MRN: [id number] DOB: 1965/10/19 Today's Date: 02/12/2021   History of Present Illness [name] Carrillo is a 55 yo F with hx of MDD, Anxiety disorder, osteomyelitis of left tibia, Hx of left tibial fracture, recurrent UTIs, EtOH abuse and liver Cirrhosis (HCC), active alcoholism, tobacco abuse with multiple readmissions. Pt presented to ED on 01/20/21 with complaints of jaundice, AMS, and concern for aspiration pneumonia. Complicated medical course significant for ETOH withdrawl, t/f to CCU with intubation, hepatomegaly, 6 mm left paraclinoid ICA aneurysm, small volume subarachnoid hemorrhage over both cerebral convexities and small amount of intraventricular hemorrhage, & TRACH placement on 02/11/21.   Clinical Impression    [name] was seen for OT/PT co-evaluation this date. Pt presents moderately sedated, on ventilator. RN cleared pt for participation prior to session and reports she is intermittently responsive, occasionally able to follow limited VCs. She is unable to provide history or PLOF. Per chart, pt was independent with ADL management, driving, and working prior to hospital admission. Per TOC notes, pt lives with her spouse and adult son. Currently pt demonstrates impairments as described below (See OT problem list) which functionally limit her ability to perform ADL/self-care tasks. Pt currently requires TOTAL assist +2 for bed-level ADL management. She demonstrates flexion response from noxious stimuli, but otherwise demonstrates limited participation in therapy session. Pt would benefit from skilled OT services to address noted impairments and functional limitations (see below for any additional details) in order to maximize safety and independence while minimizing falls risk and caregiver burden. Will keep on OT services on trial basis to determine further ability to meaningfully participate in therapy sessions. Upon  hospital discharge, recommend OT at [facility].       Recommendations for follow up therapy are one component of a multi-disciplinary discharge planning process, led by the attending physician.  Recommendations may be updated based on patient status, additional functional criteria and insurance authorization.   Follow Up Recommendations  OT at Long-term acute care hospital    Assistance Recommended at Discharge Frequent or constant Supervision/Assistance  Functional Status Assessment  Patient has had a recent decline in their functional status and/or demonstrates limited ability to make significant improvements in function in a reasonable and predictable amount of time  Equipment Recommendations  Other (comment) (TBD)    Recommendations for Other Services       Precautions / Restrictions Precautions Precautions: Fall Restrictions Weight Bearing Restrictions: No      Mobility Bed Mobility Overal bed mobility: Needs Assistance Bed Mobility: Rolling Rolling: Total assist;+2 for physical assistance;+2 for safety/equipment              Transfers                          Balance Overall balance assessment: Needs assistance   Sitting balance-Leahy Scale: Zero       Standing balance-Leahy Scale: Zero                             ADL either performed or assessed with clinical judgement   ADL Overall ADL's : Needs assistance/impaired                                       General ADL Comments: Pt currently TOTAL A (+2-3) for all ADL  management. She is unable to follow VCs during session and requires TOTAL A +2 for rolling L<>R in bed x2 this date. Currently has rectal tube, NG tube, and foley catheter in place.     Vision   Vision Assessment?: Yes Additional Comments: Pt does not visuall track or follow cues for visual assessment. Appears to startle to visual stimuli but this is inconsistent. Generally keeps eyes closed during  session.     Perception     Praxis      Pertinent Vitals/Pain Pain Assessment: Faces Breathing: occasional labored breathing, short period of hyperventilation Negative Vocalization: none Facial Expression: sad, frightened, frown Body Language: relaxed Consolability: unable to console, distract or reassure PAINAD Score: 4 Facial Expression: Tense Body Movements: Absence of movements Muscle Tension: Tense, rigid Compliance with ventilator (intubated pts.): N/A Vocalization (extubated pts.): N/A CPOT Total: 2 Pain Intervention(s): Limited activity within patient's tolerance;Monitored during session;Repositioned     Hand Dominance     Extremity/Trunk Assessment Upper Extremity Assessment Upper Extremity Assessment: Difficult to assess due to impaired cognition (Pt is unable to follow VCs for assessment. Overall BUE generally flaccid, does not demonstrate active use/movement despite prompting/stimuli.)   Lower Extremity Assessment Lower Extremity Assessment: Defer to PT evaluation;Difficult to assess due to impaired cognition       Communication Communication Communication: Tracheostomy   Cognition Arousal/Alertness: Lethargic Behavior During Therapy: WFL for tasks assessed/performed (Limited participation.) Overall Cognitive Status: Impaired/Different from baseline Area of Impairment: [place] Recovery Scale Auditory: None Visual: Visual Startle Motor: Flexion Withdrawl Oromotor/Verbal: None Communication: None Arousal: Eye opening with stimulation Total Score: 4                       General Comments  Flexseal, NGT, TRACH, Foley in place at start/end of session.    Exercises Other Exercises Other Exercises: OT/PT facilitate bed mobility and repositioning to maximize safety/skin integrity.   Shoulder Instructions      Home Living Family/patient expects to be discharged to:: Private residence Living Arrangements: Spouse/significant other;Children (42 y.o.  son, per chart.) Available Help at Discharge: Family Type of Home: House                           Additional Comments: Pt unable to profide PLOF 2/2 cognition, no family at bedside. Per chart, pt was independent with ADLs, working in HR prior to admission, +driving.      Prior Functioning/Environment Prior Level of Function : Independent/Modified Independent;Working/employed;Driving;Patient poor historian/Family not available (PLOF taken from chart review.)                        OT Problem List: Decreased strength;Decreased coordination;Cardiopulmonary status limiting activity;Decreased range of motion;Decreased cognition;Increased edema;Impaired sensation;Decreased activity tolerance;Decreased safety awareness;Impaired balance (sitting and/or standing);Decreased knowledge of use of DME or AE;Impaired UE functional use;Impaired vision/perception;Impaired tone      OT Treatment/Interventions: Self-care/ADL training;Therapeutic exercise;Modalities;Neuromuscular education;Therapeutic activities;DME and/or AE instruction;Patient/family education;Manual therapy    OT Goals(Current goals can be found in the care plan section) Acute Rehab OT Goals Patient Stated Goal: Unable to state OT Goal Formulation: Patient unable to participate in goal setting Time For Goal Achievement: 02/26/21 ADL Goals Pt Will Perform Grooming: bed level;with max assist Additional ADL Goal #1: Caregivers will return verbalize understanding of safe positioning techniques to maximize pt skin integrity and functional use of BUE. Additional ADL Goal #2: Prior to DC, caregivers will independently return demonstrate  understanding of BUE PROM therapeutic exercise program.  OT Frequency: Min 1X/week (Trial)   Barriers to D/C: Inaccessible home environment;Decreased caregiver support          Co-evaluation PT/OT/SLP Co-Evaluation/Treatment: Yes Reason for Co-Treatment: Complexity of the patient's  impairments (multi-system involvement);For patient/therapist safety;To address functional/ADL transfers PT goals addressed during session: Mobility/safety with mobility;Strengthening/ROM OT goals addressed during session: ADL's and self-care;Strengthening/ROM      AM-PAC OT "6 Clicks" Daily Activity     Outcome Measure Help from another person eating meals?: Total (NPO/NG) Help from another person taking care of personal grooming?: Total Help from another person toileting, which includes using toliet, bedpan, or urinal?: Total Help from another person bathing (including washing, rinsing, drying)?: Total Help from another person to put on and taking off regular upper body clothing?: Total Help from another person to put on and taking off regular lower body clothing?: Total 6 Click Score: 6   End of Session Equipment Utilized During Treatment: Oxygen Nurse Communication: Mobility status  Activity Tolerance: Patient limited by lethargy;Treatment limited secondary to medical complications (Comment) Patient left: in bed;with call bell/phone within reach;with bed alarm set  OT Visit Diagnosis: Other symptoms and signs involving the nervous system (R29.898);Other symptoms and signs involving cognitive function;Muscle weakness (generalized) (M62.81)                Time: 1062-6948 OT Time Calculation (min): 36 min Charges:  OT General Charges $OT Visit: 1 Visit OT Evaluation $OT Eval High Complexity: 1 High OT Treatments $Self Care/Home Management : 8-22 mins  Rockney Ghee, M.S., OTR/L Feeding Team, Special [facility] Nursery Ascom: [phone] 02/12/21, 12:41 PM

## 2021-02-12 NOTE — Progress Notes (Signed)
PHARMACY CONSULT NOTE - FOLLOW UP  Pharmacy Consult for Electrolyte Monitoring and Replacement   Recent Labs: Potassium (mmol/L)  Date Value  02/12/2021 4.0  04/28/2012 3.9   Magnesium (mg/dL)  Date Value  36/14/4315 1.9   Calcium (mg/dL)  Date Value  40/11/6759 8.7 (L)   Calcium, Total (mg/dL)  Date Value  95/12/3265 8.3 (L)   Albumin (g/dL)  Date Value  12/45/8099 2.2 (L)   Phosphorus (mg/dL)  Date Value  83/38/2505 3.3   Sodium (mmol/L)  Date Value  02/12/2021 144  04/28/2012 136     Assessment: 55 year old female with history of alcohol use and liver cirrhosis. Patient transferred to the ICU 10/24 and intubated; remains on mechanical ventilation off sedation. MRI brain with new small volume subarachnoid hemorrhage. Plan for tracheostomy 11/10. Patient has been receiving intermittent diuresis. Pharmacy consult for electrolyte management.  Goal of Therapy:  Electrolytes WNL  Plan:  --No replacement indicated today --Recheck electrolytes with morning labs  Pricilla Riffle, PharmD, BCPS Clinical Pharmacist 02/12/2021 11:45 AM

## 2021-02-13 LAB — CBC WITH DIFFERENTIAL/PLATELET
Abs Immature Granulocytes: 0.17 10*3/uL — ABNORMAL HIGH (ref 0.00–0.07)
Basophils Absolute: 0.1 10*3/uL (ref 0.0–0.1)
Basophils Relative: 0 %
Eosinophils Absolute: 0.4 10*3/uL (ref 0.0–0.5)
Eosinophils Relative: 2 %
HCT: 26.6 % — ABNORMAL LOW (ref 36.0–46.0)
Hemoglobin: 7.9 g/dL — ABNORMAL LOW (ref 12.0–15.0)
Immature Granulocytes: 1 %
Lymphocytes Relative: 7 %
Lymphs Abs: 1.5 10*3/uL (ref 0.7–4.0)
MCH: 38.2 pg — ABNORMAL HIGH (ref 26.0–34.0)
MCHC: 29.7 g/dL — ABNORMAL LOW (ref 30.0–36.0)
MCV: 128.5 fL — ABNORMAL HIGH (ref 80.0–100.0)
Monocytes Absolute: 1.9 10*3/uL — ABNORMAL HIGH (ref 0.1–1.0)
Monocytes Relative: 8 %
Neutro Abs: 19.1 10*3/uL — ABNORMAL HIGH (ref 1.7–7.7)
Neutrophils Relative %: 82 %
Platelets: 197 10*3/uL (ref 150–400)
RBC: 2.07 MIL/uL — ABNORMAL LOW (ref 3.87–5.11)
RDW: 24.4 % — ABNORMAL HIGH (ref 11.5–15.5)
Smear Review: NORMAL
WBC: 23.1 10*3/uL — ABNORMAL HIGH (ref 4.0–10.5)
nRBC: 0.1 % (ref 0.0–0.2)

## 2021-02-13 LAB — COMPREHENSIVE METABOLIC PANEL
ALT: 67 U/L — ABNORMAL HIGH (ref 0–44)
AST: 120 U/L — ABNORMAL HIGH (ref 15–41)
Albumin: 2.2 g/dL — ABNORMAL LOW (ref 3.5–5.0)
Alkaline Phosphatase: 142 U/L — ABNORMAL HIGH (ref 38–126)
Anion gap: 5 (ref 5–15)
BUN: 27 mg/dL — ABNORMAL HIGH (ref 6–20)
CO2: 36 mmol/L — ABNORMAL HIGH (ref 22–32)
Calcium: 8.7 mg/dL — ABNORMAL LOW (ref 8.9–10.3)
Chloride: 103 mmol/L (ref 98–111)
Creatinine, Ser: 0.43 mg/dL — ABNORMAL LOW (ref 0.44–1.00)
GFR, Estimated: >60 mL/min (ref >60)
Glucose, Bld: 184 mg/dL — ABNORMAL HIGH (ref 70–99)
Potassium: 4.6 mmol/L (ref 3.5–5.1)
Sodium: 144 mmol/L (ref 135–145)
Total Bilirubin: 2.8 mg/dL — ABNORMAL HIGH (ref 0.3–1.2)
Total Protein: 6.9 g/dL (ref 6.5–8.1)

## 2021-02-13 LAB — GLUCOSE, CAPILLARY
Glucose-Capillary: 172 mg/dL — ABNORMAL HIGH (ref 70–99)
Glucose-Capillary: 150 mg/dL — ABNORMAL HIGH (ref 70–99)
Glucose-Capillary: 179 mg/dL — ABNORMAL HIGH (ref 70–99)

## 2021-02-13 LAB — MAGNESIUM: Magnesium: 1.8 mg/dL (ref 1.7–2.4)

## 2021-02-13 LAB — PHOSPHORUS: Phosphorus: 3.2 mg/dL (ref 2.5–4.6)

## 2021-02-13 LAB — PROCALCITONIN: Procalcitonin: 0.31 ng/mL

## 2021-02-13 MED ORDER — MAGNESIUM SULFATE IN D5W 1-5 GM/100ML-% IV SOLN
1.0000 g | Freq: Once | INTRAVENOUS | Status: AC
  Administered 2021-02-13: 1 g via INTRAVENOUS
  Filled 2021-02-13: qty 100

## 2021-02-13 MED ORDER — IPRATROPIUM-ALBUTEROL 0.5-2.5 (3) MG/3ML IN SOLN: 3.0000 mL | Freq: Four times a day (QID) | RESPIRATORY_TRACT | Status: DC

## 2021-02-13 MED ORDER — FUROSEMIDE 10 MG/ML IJ SOLN
20.0000 mg | Freq: Once | INTRAMUSCULAR | Status: AC
  Administered 2021-02-13: 20 mg via INTRAVENOUS
  Filled 2021-02-13: qty 2

## 2021-02-13 MED ORDER — THIAMINE HCL 100 MG PO TABS
100.0000 mg | ORAL_TABLET | Freq: Every day | ORAL | Status: DC
  Administered 2021-02-13: 100 mg
  Filled 2021-02-13: qty 1

## 2021-02-13 NOTE — Discharge Summary (Addendum)
Physician Discharge Summary  Patient ID: [name] Carrillo MRN: [id number] DOB/AGE: 56-Dec-1967 56 y.o.  Admit date: 01/20/2021 Discharge date: 02/13/2021  Admission Diagnoses: Severe hepatic encephalopathy Discharge Diagnoses:  Principal Problem:   Acute metabolic encephalopathy Active Problems:   Cirrhosis of liver ([place])   Depression with anxiety   Alcohol abuse   Acute hepatic encephalopathy   Abnormal LFTs   Hyponatremia   UTI (urinary tract infection)   Macrocytic anemia   Leukocytosis   Acute respiratory failure with hypoxia (HCC)   On mechanically assisted ventilation ([place])   Tracheostomy in place [facility])   Endotracheally intubated   Discharged Condition: stable  Hospital Course:  10/19 Pt admitted to the medsurg unit with delirium tremors secondary to ETOH withdrawal  10/19: CT Head negative  10/19: CT Abd/Pelvis revealed Hepatomegaly with heterogeneous liver enhancement and evidence of portal venous hypertension including recanalized paraumbilical vein, small spleno-renal varices, and small volume ascites. Chronic liver disease strongly suspected although the enhancement pattern might indicate a superimposed acute hepatitis. No evidence of bile duct obstruction. No discrete liver lesion. No obstructive uropathy but absent renal contrast excretion on the delayed images suggesting acute renal insufficiency. Punctate nephrolithiasis. Borderline to mild cardiomegaly. Bilateral lower lobe atelectasis. Mild aortic atherosclerosis.  10/24 Transfer to ICU, intubation, CVL placed 10/25: Echo revealed EF 60 to 65% 10/28: Pt with hepatic encephalopathy currently undergoing SBT 10/5.  14L positive will administer 40 mg iv lasix x1 dose  10/28: CT Head motion limited exam. Allowing for this, no acute intracranial abnormality. 10/28: CT Maxillofacial revealed prominent dental caries within the residual right maxillary molar. No associated inflammation. No other focal dental or  periodontal disease to suggest infection. Minimal inflammatory changes about the lateral right orbit. No abscess. 10/31: CTA Head revealed no acute intracranial abnormality. A 6 mm left paraclinoid ICA aneurysm. Consult to neuro intervention suggested. No intracranial large vessel occlusion or hemodynamically significant stenosis. Intracranial stenosis described on prior MR angiogram are likely artifactual. Patent major neck arteries without hemodynamically significant stenosis. 10/31: MRA Head/Neck revealed medially directed 6 mm aneurysm from the distal left cavernous carotid/ophthalmic segment. Poor visualization of the right cavernous carotid, which may indicate slow flow or stenosis. Consider CTA for further evaluation. Apparent narrowing of the distal basilar artery, which may be artifactual or indicate focal stenosis. No hemodynamically significant stenosis in the neck. No large vessel occlusion. 10/31: EEG suggestive of moderate to severe diffuse encephalopathy, nonspecific etiology. No seizures or epileptiform discharges were seen throughout the recording.  11/1: Pt remains severely encephalopathic despite sedation remaining off since 10/31 _0 :22 am  11/2: Remains encephalopathic off sedation.  ENT consulted for [facility], family wishes to proceed next week. 11/3: No change in mental status, remains encephalopathic. Hypernatremia unchanged (150), increase Free Water flushes to 250 cc q4h, hold off on diuresis.  IR to evaluate for Paracentesis 11/5: Opens eyes to voice but not following commands, plan to diurese today 11/6 remains intubated. MRI Brain reveals new scattered small volume subarachnoid hemorrhage over both cerebral convexities and small amount of intraventricular hemorrhage dependently within the lateral ventricles. Unchanged size of subacute pontine infarct. Resolved thalamic diffusion abnormality. MRI cervical spine does not show any spinal cord abnormality 11/7: More alert today.  Plan  for repeat CTH to follow up on subarachnoid hemorrhage.  Plan for diuresis 11/8: Neuro status unchanged. Attempt to exercise respiratory muscles spontaneous mode.  Still remains too weak to protect her airway.  Plan for diuresis 11/9: Neuro status unchanged.  Tolerated SBT  for about 2 hours, then exhibiting signs of respiratory muscle fatigue.  Repeat CTH with no new abnormality.  Will proceed with [facility] tomorrow as planned 11/10: TRACH placed (#6 cuffed shiley) by ENT. 11/11: More awake and following commands (extremely weak).  PT/OT consulted. Search for [facility] placement in progress. Intermittent anxiety/restlessness ~ add oxycodone and Clonazepam 11/12: Patient approved to go to [facility], hemodynamically stable for transfer    Consults: pulmonary/intensive care, GI, neurology, and ENT  Significant Diagnostic Studies:  See hospital course above  Treatments:  Completed full course of antibiotics.  See med list below.   Discharge Exam: Blood pressure 112/60, pulse 96, temperature 98.5 F (36.9 C), temperature source Oral, resp. rate (!) 25, height _0  (1.651 m), weight 91.3 kg, last menstrual period 09/07/2015, SpO2 94 %. General: Chronically ill appearing female, mechanically ventilated via tracheostomy,  intermittent anxiety but synchronous with vent  HENT: Atraumatic, normocephalic, supple, no JVD, new Tracheostomy midline neck is clean, dry, and intact  Lungs: diffuse rhonchi throughout, even, non labored, on PS mode on vent Cardiovascular: Regular rate & rhythm, no R/G, 2+ radial/1+ distal pulses, 2+ generalized edema  Abdomen: +BS x4, soft, obese, abdominal distension present  Extremities: normal bulk and tone, severe generalized weakness though improved today, 2+ anasarca Neuro: Awake and alert (unable to assess orientation due to trach) nods to questions, follows commands but extremely weak, no focal deficits, PERRL, icteric sclera  GU: indwelling foley catheter draining  yellow urine    Disposition: Discharge to [facility]  Discharge Instructions     Discharge patient   Complete by: As directed    Discharge disposition: [place] Not Defined   Discharge patient date: 02/13/2021      Allergies as of 02/13/2021   No Known Allergies      Medication List     STOP taking these medications    naproxen sodium 220 MG tablet Commonly known as: ALEVE   oxyCODONE 5 MG immediate release tablet Commonly known as: Oxy IR/ROXICODONE   sodium chloride flush 0.9 % Soln Commonly known as: NS        Current inpatient meds [facility]:  Current Facility-Administered Medications:    0.9 %  sodium chloride infusion, , Intravenous, PRN, Ralene Muskrat B, MD, Stopped at 02/08/21 0444   ascorbic acid (VITAMIN C) tablet 500 mg, 500 mg, Per Tube, BID, Maryjane Hurter, MD, 500 mg at 02/13/21 1011   chlorhexidine gluconate (MEDLINE KIT) (PERIDEX) 0.12 % solution 15 mL, 15 mL, Mouth Rinse, BID, Aleskerov, Fuad, MD, 15 mL at 02/13/21 1012   Chlorhexidine Gluconate Cloth 2 % PADS 6 each, 6 each, Topical, Daily, Sreenath, Sudheer B, MD, 6 each at 02/13/21 1013   clonazePAM (KLONOPIN) tablet 1 mg, 1 mg, Per Tube, BID PRN, Darel Hong D, NP, 1 mg at 02/13/21 0035   docusate (COLACE) 50 MG/5ML liquid 100 mg, 100 mg, Per Tube, BID, Jonnie Finner, Michele Mcalpine, MD, 100 mg at 02/13/21 1010   feeding supplement (PROSource TF) liquid 45 mL, 45 mL, Per Tube, Daily, Maryjane Hurter, MD, 45 mL at 02/13/21 1013   feeding supplement (VITAL 1.5 CAL) liquid 1,000 mL, 1,000 mL, Per Tube, Continuous, Kasa, Kurian, MD, Last Rate: 60 mL/hr at 02/13/21 0859, 1,000 mL at 02/13/21 0859   fentaNYL (SUBLIMAZE) injection 50 mcg, 50 mcg, Intravenous, Q15 min PRN, Bennie Pierini, MD, 50 mcg at 02/11/21 0020   fentaNYL (SUBLIMAZE) injection 50-200 mcg, 50-200 mcg, Intravenous, Q30 min PRN, [place],  Michele Mcalpine, MD, 100 mcg at 32/99/24 2683   folic acid (FOLVITE)  tablet 1 mg, 1 mg, Per Tube, Daily, [place], Toribio Harbour L, NP, 1 mg at 02/13/21 1011   free water 200 mL, 200 mL, Per Tube, Q4H, Darel Hong D, NP, 200 mL at 02/13/21 0859   influenza vac split quadrivalent PF (FLUARIX) injection 0.5 mL, 0.5 mL, Intramuscular, Tomorrow-1000, Meier, Hortencia Conradi, MD   insulin aspart (novoLOG) injection 0-15 Units, 0-15 Units, Subcutaneous, Q4H, Graves, Raeford Razor, NP, 2 Units at 02/13/21 0859   iohexol (OMNIPAQUE) 350 MG/ML injection 75 mL, 75 mL, Intravenous, Once PRN, Kerney Elbe, MD   ipratropium-albuterol (DUONEB) 0.5-2.5 (3) MG/3ML nebulizer solution 3 mL, 3 mL, Nebulization, Q6H, Tyler Pita, MD   lactulose (CHRONULAC) 10 GM/15ML solution 30 g, 30 g, Per Tube, BID, Jonnie Finner, Michele Mcalpine, MD, 30 g at 02/13/21 1010   magnesium sulfate IVPB 1 g 100 mL, 1 g, Intravenous, Once, Noralee Space, RPH, Last Rate: 100 mL/hr at 02/13/21 1040, 1 g at 02/13/21 1040   MEDLINE mouth rinse, 15 mL, Mouth Rinse, 10 times per day, Ottie Glazier, MD, 15 mL at 02/13/21 1041   midazolam (VERSED) injection 2 mg, 2 mg, Intravenous, Q2H PRN, Darel Hong D, NP, 2 mg at 02/12/21 1802   nutrition supplement (JUVEN) (JUVEN) powder packet 1 packet, 1 packet, Per Tube, BID BM, Tyler Pita, MD, 1 packet at 02/13/21 1011   ondansetron (ZOFRAN) injection 4 mg, 4 mg, Intravenous, Q8H PRN, Ivor Costa, MD, 4 mg at 02/11/21 0756   oxyCODONE (ROXICODONE) 5 MG/5ML solution 10 mg, 10 mg, Per Tube, Q4H PRN, Darel Hong D, NP   pantoprazole sodium (PROTONIX) 40 mg/20 mL oral suspension 40 mg, 40 mg, Per Tube, Q1200, Maryjane Hurter, MD, 40 mg at 02/12/21 1156   polyethylene glycol (MIRALAX / GLYCOLAX) packet 17 g, 17 g, Per Tube, Daily, Darel Hong D, NP, 17 g at 02/13/21 1011   rifaximin (XIFAXAN) tablet 550 mg, 550 mg, Per Tube, BID, Maryjane Hurter, MD, 550 mg at 02/13/21 1030   sodium chloride flush (NS) 0.9 % injection 10-40 mL, 10-40 mL, Intracatheter, Q12H,  Aleskerov, Fuad, MD, 10 mL at 02/13/21 1014   sodium chloride flush (NS) 0.9 % injection 10-40 mL, 10-40 mL, Intracatheter, PRN, Ottie Glazier, MD   thiamine tablet 100 mg, 100 mg, Per Tube, Daily, Tyler Pita, MD, 100 mg at 02/13/21 1011   zinc sulfate capsule 220 mg, 220 mg, Per Tube, Daily, Bennie Pierini, MD, 220 mg at 02/13/21 1011    Signed: Renold Don, MD Advanced Bronchoscopy P[facility]  02/13/2021, 9:29 AM  *This note was dictated using voice recognition software/Dragon.  Despite best efforts to proofread, errors can occur which can change the meaning.  Any change was purely unintentional.

## 2021-02-13 NOTE — Progress Notes (Signed)
NAME:  [name] Carrillo, MRN:  [id number], DOB:  05/03/1965, LOS: 24 ADMISSION DATE:  01/20/2021, CONSULTATION DATE:  01/24/21 REFERRING MD:  Priscella Mann, CHIEF COMPLAINT:  unresponsive   History of Present Illness:  55 yo F wthi hx of MDD, Anxiety disorder, osteomyelitis of left tibia, left tibial fracture history, recurrent UTIs, EtOH abuse and liver Cirrhosis ([place]), active alcoholism, tobacco abuse with multiple readmissions to hospital with EtOH withdrawal syndrome and now being brought into MICU due to unstable vital signs and aggitated hyperactive delerium with concerns for delerium tremens. Blood work is notable for transaminitis with decreased synthetic function.     Patient had ziplock bag with two teeth in there due to loose teeth in mouth. Husband shares that patient has few false teeth that she had created herself.    Ive met with husband and son and explained that patient is unresponsive with GCS7 and is not able to protect airway. They understand the severity of situation and agree for intubation if needed. We discussed goals of care and patient is full code.   Pertinent  Medical History  MDD Anxiety Alcohol use disorder Alcoholic cirrhosis Smoking  Significant Hospital Events: Including procedures, antibiotic start and stop dates in addition to other pertinent events   10/19 Pt admitted to the [facility] unit with delirium tremors secondary to ETOH withdrawal  10/19: CT Head negative  10/19: CT Abd/Pelvis revealed Hepatomegaly with heterogeneous liver enhancement and evidence of portal venous hypertension including recanalized paraumbilical vein, small spleno-renal varices, and small volume ascites. Chronic liver disease strongly suspected although the enhancement pattern might indicate a superimposed acute hepatitis. No evidence of bile duct obstruction. No discrete liver lesion. No obstructive uropathy but absent renal contrast excretion on the delayed images suggesting acute renal  insufficiency. Punctate nephrolithiasis. Borderline to mild cardiomegaly. Bilateral lower lobe atelectasis. Mild aortic atherosclerosis.  10/24 Transfer to ICU, intubation, CVL placed 10/25: Echo revealed EF 60 to 65% 10/28: Pt with hepatic encephalopathy currently undergoing SBT 10/5.  14L positive will administer 40 mg iv lasix x1 dose  10/28: CT Head motion limited exam. Allowing for this, no acute intracranial abnormality. 10/28: CT Maxillofacial revealed prominent dental caries within the residual right maxillary molar. No associated inflammation. No other focal dental or periodontal disease to suggest infection. Minimal inflammatory changes about the lateral right orbit. No abscess. 10/31: CTA Head revealed no acute intracranial abnormality. A 6 mm left paraclinoid ICA aneurysm. Consult to neuro intervention suggested. No intracranial large vessel occlusion or hemodynamically significant stenosis. Intracranial stenosis described on prior MR angiogram are likely artifactual. Patent major neck arteries without hemodynamically significant stenosis. 10/31: MRA Head/Neck revealed medially directed 6 mm aneurysm from the distal left cavernous carotid/ophthalmic segment. Poor visualization of the right cavernous carotid, which may indicate slow flow or stenosis. Consider CTA for further evaluation. Apparent narrowing of the distal basilar artery, which may be artifactual or indicate focal stenosis. No hemodynamically significant stenosis in the neck. No large vessel occlusion. 10/31: EEG suggestive of moderate to severe diffuse encephalopathy, nonspecific etiology. No seizures or epileptiform discharges were seen throughout the recording.  11/1: Pt remains severely encephalopathic despite sedation remaining off since 10/31 '@11' :22 am  11/2: Remains encephalopathic off sedation.  ENT consulted for [facility], family wishes to proceed next week. 11/3: No change in mental status, remains encephalopathic.  Hypernatremia unchanged (150), increase Free Water flushes to 250 cc q4h, hold off on diuresis.  IR to evaluate for Paracentesis 11/5: Opens eyes to voice but not  following commands, plan to diurese today 11/6 remains intubated. MRI Brain reveals new scattered small volume subarachnoid hemorrhage over both cerebral convexities and small amount of intraventricular hemorrhage dependently within the lateral ventricles. Unchanged size of subacute pontine infarct. Resolved thalamic diffusion abnormality. MRI cervical spine does not show any spinal cord abnormality 11/7: More alert today.  Plan for repeat CTH to follow up on subarachnoid hemorrhage.  Plan for diuresis 11/8: Neuro status unchanged. Attempt to exercise respiratory muscles spontaneous mode.  Still remains too weak to protect her airway.  Plan for diuresis 11/9: Neuro status unchanged.  Tolerated SBT for about 2 hours, then exhibiting signs of respiratory muscle fatigue.  Repeat CTH with no new abnormality.  Will proceed with [facility] tomorrow as planned 11/10: TRACH placed (#6 cuffed shiley) by ENT. 11/11: More awake and following commands (extremely weak).  PT/OT consulted. Search for [facility] placement in progress. Intermittent anxiety/restlessness ~ add oxycodone and Clonazepam 11/12: Patient approved to go to [facility] hemodynamically stable for transfer  Interim History / Subjective:  -No significant events overnight -Afebrile, hemodynamically stable, no vasopressors -TRACH placed 73/41, no complications -On PS mode on vent -PRN Clonazepam and Oxycodone via tube -Lasix x1 today -Persistent Leukocytosis of 22.5 (22.3)~ has been treated for UTI, suspected PNA (ABX d/c due to negative culture) and Paracentesis culture negative ~ Procalcitonin is trending down ~suspect related to underlying alcoholic liver disease -PT/OT consult   Objective   Blood pressure 112/60, pulse 96, temperature 98.5 F (36.9 C), temperature source Oral, resp.  rate (!) 25, height '5\' 5"'  (1.651 m), weight 91.3 kg, last menstrual period 09/07/2015, SpO2 94 %.    Vent Mode: PRVC FiO2 (%):  [36 %-50 %] 45 % Set Rate:  [20 bmp] 20 bmp Vt Set:  [400 mL] 400 mL PEEP:  [5 cmH20] [place] Pressure:  [9 cmH20-14 cmH20] 14 cmH20   Intake/Output Summary (Last 24 hours) at 02/13/2021 0912 Last data filed at 02/13/2021 0500 Gross per 24 hour  Intake --  Output 1825 ml  Net -1825 ml    Filed Weights   02/08/21 0500 02/12/21 0312 02/13/21 0459  Weight: 95 kg 91 kg 91.3 kg    Examination: General: acutely ill appearing female, mechanically ventilated via tracheostomy,  intermittent anxiety but synchronous with vent  HENT: Atraumatic, normocephalic, supple, no JVD, new Tracheostomy midline neck is clean, dry, and intact  Lungs: diffuse rhonchi throughout, even, non labored, on PS mode on vent Cardiovascular: Regular rate & rhythm, no R/G, 2+ radial/1+ distal pulses, 2+ generalized edema  Abdomen: +BS x4, soft, obese, abdominal distension present  Extremities: normal bulk and tone, severe generalized weakness though improved today, 2+ anasarca Neuro: Awake and alert (unable to assess orientation due to trach) nods to questions, follows commands but extremely weak, no focal deficits, PERRL, icteric sclera  GU: indwelling foley catheter draining yellow urine    Resolved Hospital Problem list     Assessment & Plan:   Acute encephalopathy multifactorial secondary acute/subacute ischemic pontine infarct, hyperammonemia and infectious process ~ improving New scattered small volume subarachnoid hemorrhage over both cerebral convexities and small amount of intraventricular hemorrhage Moderate-severe alcohol withdrawal with delirium ~resolved -Provide supportive care -Frequent neuro exams -Per neurology: aneurysm on CTA Head/Neck and MRA Head Neck can be followed in the outpatient setting  -Sedation has been adjusted -Continue thiamine, folate and  multivitamins -Continue lactulose 30 g per tube daily and rifaximin  -Trend ammonia  -ASA/anticoagulation discontinued -Neurology feels the subarachoid blood  is most likely secondary to a coagulation disorder in the setting of the patient's liver disease. The patchy distribution does not strongly suggest an aneurysmal bleed.   -Repeat CTA Head 11/6 showed unchanged 6 mm Left Paraclinoid ICA Aneurysm  -Repeat CTH 11/9 showed overall stability of subarachnoid hemorrhage   UTI due to E. coli/Klebsiella~completed course of ceftriaxone  Worsening leukocytosis secondary to questionable pneumonia ~ treated Persistent Leukocytosis likely due to underlying alcoholic liver disease likely related to underlying liver disease from alcohol use. -Monitor fever curve -Trend WBC's & Procalcitonin -Follow cultures as above -Zosyn discontinued (10/29 - 11/1) as follow up Tracheal aspirate from 10/28 with rare normal respiratory flora -Paracentesis culture from 11/3  with no growth  -Persistent Leukocytosis, currently afebrile and normotensive. Was treated for UTI, initially treated for PNA but ABX stopped as cultures were negative, and Paracentesis culture negative ~ Procalcitonin continues to trend down ~likely related to alcoholic liver disease  Acute hypoxic respiratory failure in the setting of Hepatic Encephalopathy and suspected Pneumonia -S/p Tracheostomy 02/11/21 -Full vent support, implement lung protective strategies -Plateau pressures less than 30 cm H20 -Wean FiO2 & PEEP as tolerated to maintain O2 sats >92% -Follow intermittent Chest X-ray & ABG as needed -Spontaneous Breathing Trials/Trach Collar trials as tolerated  -Implement VAP Bundle -Prn Bronchodilators -Diuresis as BP and renal function permits  -Stabilized for transfer to LTACH  Acute alcoholic hepatitis Ascites  -Monitor hepatic function panel and bleeding times -S/p paracentesis on 11/3 with removal of 1.5L ~ culture with no  growth   Hypernatremia ~ resolved Hypokalemia ~ resolved -Monitor I&O's / urinary output -Follow BMP -Ensure adequate renal perfusion -Avoid nephrotoxic agents as able -Replace electrolytes as indicated -Pharmacy following for assistance with electrolyte replacement -Continue free water flushes to 200 cc q4h  Severe protein calorie malnutrition Dietitian consulted appreciate input~continue tube feeds  Macrocytic anemia due to folate deficiency Thrombocytopenia Likely driven by liver disease/alcoholism -Monitor for S/Sx of bleeding -Trend CBC -SCD's for VTE Prophylaxis (chemical prophylaxis contraindicated due to subarachnoid hemorrhage) -Transfuse for Hgb <7 -Continue folic acid supplementation    Best Practice (right click and "Reselect all SmartList Selections" daily)   Diet/type: tubefeeds DVT prophylaxis: SCD; (chemical prophylaxis contraindicated due to subarachnoid hemorrhage) GI prophylaxis: PPI Lines: N/A Foley:  Yes and still indicated  Code Status:  full code Last date of multidisciplinary goals of care discussion [02/12/2021]    Critical care time: 35 minutes     Patient is stabilized for transfer to [facility].  Renold Don, MD Advanced Bronchoscopy PCCM [place] [facility]    *This note was dictated using voice recognition software/Dragon.  Despite best efforts to proofread, errors can occur which can change the meaning.  Any change was purely unintentional.

## 2021-02-13 NOTE — TOC Transition Note (Signed)
Transition of Care [facility]) - CM/SW Discharge Note   Patient Details  Name: [name] Carrillo MRN: [id number] Date of Birth: [birth year]  [facility] CM/SW Contact:  Allayne Butcher, RN Phone Number: 02/13/2021, 10:40 AM   Clinical Narrative:    Patient has a bed over at [facility] room 313.  Bedside RN will call and give report to [phone].  Accepting physician is Eliezer Champagne.  RNCM has called and set up [facility] for transport.  They will call the unit RN for report when they are on the way to pick up patient, probably sometime this afternoon.  Patient's husband, Casimiro Needle, called and notified of discharge to LTAC today.     Final next level of care: Long Term Acute Care (LTAC) Barriers to Discharge: Barriers Resolved   Patient Goals and CMS Choice Patient states their goals for this hospitalization and ongoing recovery are:: Patient intubated/sedated, husband receptive to IPR CMS Medicare.gov Compare Post Acute Care list provided to:: Patient Represenative (must comment) Choice offered to / list presented to : Spouse  Discharge Placement              Patient chooses bed at: Other - please specify in the comment section below: ([facility]) Patient to be transferred to facility by: [facility] Name of family member notified: Roseanne Reno Patient and family notified of of transfer: 02/13/21  Discharge Plan and Services     Post Acute Care Choice: NA          DME Arranged: N/A DME Agency: NA       HH Arranged: NA HH Agency: NA        Social Determinants of Health (SDOH) Interventions     Readmission Risk Interventions Readmission Risk Prevention Plan 02/05/2021  Transportation Screening Complete  PCP or Specialist Appt within 5-7 Days Not Complete  Not Complete comments NMS for Discharge  Home Care Screening Complete  Medication Review (RN CM) Complete  Some recent data might be hidden

## 2021-02-13 NOTE — Progress Notes (Signed)
PHARMACY CONSULT NOTE - FOLLOW UP  Pharmacy Consult for Electrolyte Monitoring and Replacement   Recent Labs: Potassium (mmol/L)  Date Value  02/13/2021 4.6  04/28/2012 3.9   Magnesium (mg/dL)  Date Value  94/10/6806 1.8   Calcium (mg/dL)  Date Value  81/01/3158 8.7 (L)   Calcium, Total (mg/dL)  Date Value  45/85/9292 8.3 (L)   Albumin (g/dL)  Date Value  44/62/8638 2.2 (L)   Phosphorus (mg/dL)  Date Value  17/71/1657 3.2   Sodium (mmol/L)  Date Value  02/13/2021 144  04/28/2012 136     Assessment: 55 year old female with history of alcohol use and liver cirrhosis. Patient transferred to the ICU 10/24 and intubated; remains on mechanical ventilation off sedation. MRI brain with new small volume subarachnoid hemorrhage. Plan for tracheostomy 11/10. Patient has been receiving intermittent diuresis. Pharmacy consult for electrolyte management.  Goal of Therapy:  Electrolytes WNL  Plan:  Mag 1.8- will order Magnesium sulfate 1 gm IV x 1 --Recheck electrolytes with morning labs  Angelique Blonder, PharmD Clinical Pharmacist 02/13/2021 7:42 AM

## 2021-02-13 NOTE — TOC Progression Note (Signed)
Transition of Care [facility]) - Progression Note    Patient Details  Name: [name] Carrillo MRN: [id number] Date of Birth: 1966/03/28  Transition of Care [facility]) CM/SW Contact  Allayne Butcher, RN Phone Number: 02/13/2021, 9:05 AM  Clinical Narrative:     Patient's insurance has approved authorization for LTAC, plan for discharge to [facility] today.    Expected Discharge Plan: IP Rehab Facility Barriers to Discharge: Continued Medical Work up  Expected Discharge Plan and Services Expected Discharge Plan: IP Rehab Facility     Post Acute Care Choice: NA Living arrangements for the past 2 months: Single Family Home                                       Social Determinants of Health (SDOH) Interventions    Readmission Risk Interventions Readmission Risk Prevention Plan 02/05/2021  Transportation Screening Complete  PCP or Specialist Appt within 5-7 Days Not Complete  Not Complete comments NMS for Discharge  Home Care Screening Complete  Medication Review (RN CM) Complete  Some recent data might be hidden

## 2021-02-15 LAB — VITAMIN E
Vitamin E (Alpha Tocopherol): 14.5 mg/L (ref 7.0–25.1)
Vitamin E(Gamma Tocopherol): 2.3 mg/L (ref 0.5–5.5)

## 2021-02-22 DIAGNOSIS — Z930 Tracheostomy status: Secondary | ICD-10-CM

## 2021-02-22 DIAGNOSIS — J188 Other pneumonia, unspecified organism: Secondary | ICD-10-CM

## 2021-02-22 DIAGNOSIS — J9621 Acute and chronic respiratory failure with hypoxia: Secondary | ICD-10-CM

## 2021-02-22 DIAGNOSIS — I609 Nontraumatic subarachnoid hemorrhage, unspecified: Secondary | ICD-10-CM

## 2021-02-23 DIAGNOSIS — I609 Nontraumatic subarachnoid hemorrhage, unspecified: Secondary | ICD-10-CM

## 2021-02-23 DIAGNOSIS — Z930 Tracheostomy status: Secondary | ICD-10-CM

## 2021-02-23 DIAGNOSIS — J188 Other pneumonia, unspecified organism: Secondary | ICD-10-CM

## 2021-02-23 DIAGNOSIS — J9621 Acute and chronic respiratory failure with hypoxia: Secondary | ICD-10-CM

## 2021-02-24 DIAGNOSIS — I609 Nontraumatic subarachnoid hemorrhage, unspecified: Secondary | ICD-10-CM

## 2021-02-24 DIAGNOSIS — Z930 Tracheostomy status: Secondary | ICD-10-CM

## 2021-02-24 DIAGNOSIS — J188 Other pneumonia, unspecified organism: Secondary | ICD-10-CM

## 2021-02-24 DIAGNOSIS — J9621 Acute and chronic respiratory failure with hypoxia: Secondary | ICD-10-CM

## 2021-02-25 DIAGNOSIS — I609 Nontraumatic subarachnoid hemorrhage, unspecified: Secondary | ICD-10-CM

## 2021-02-25 DIAGNOSIS — J188 Other pneumonia, unspecified organism: Secondary | ICD-10-CM

## 2021-02-25 DIAGNOSIS — J9621 Acute and chronic respiratory failure with hypoxia: Secondary | ICD-10-CM

## 2021-02-25 DIAGNOSIS — Z930 Tracheostomy status: Secondary | ICD-10-CM

## 2021-02-26 DIAGNOSIS — J188 Other pneumonia, unspecified organism: Secondary | ICD-10-CM

## 2021-02-26 DIAGNOSIS — I609 Nontraumatic subarachnoid hemorrhage, unspecified: Secondary | ICD-10-CM

## 2021-02-26 DIAGNOSIS — J9621 Acute and chronic respiratory failure with hypoxia: Secondary | ICD-10-CM

## 2021-02-26 DIAGNOSIS — Z930 Tracheostomy status: Secondary | ICD-10-CM

## 2021-02-27 DIAGNOSIS — J9621 Acute and chronic respiratory failure with hypoxia: Secondary | ICD-10-CM

## 2021-02-27 DIAGNOSIS — I609 Nontraumatic subarachnoid hemorrhage, unspecified: Secondary | ICD-10-CM

## 2021-02-27 DIAGNOSIS — Z930 Tracheostomy status: Secondary | ICD-10-CM

## 2021-02-27 DIAGNOSIS — J188 Other pneumonia, unspecified organism: Secondary | ICD-10-CM

## 2021-02-28 DIAGNOSIS — Z930 Tracheostomy status: Secondary | ICD-10-CM

## 2021-02-28 DIAGNOSIS — J188 Other pneumonia, unspecified organism: Secondary | ICD-10-CM

## 2021-02-28 DIAGNOSIS — J9621 Acute and chronic respiratory failure with hypoxia: Secondary | ICD-10-CM

## 2021-02-28 DIAGNOSIS — I609 Nontraumatic subarachnoid hemorrhage, unspecified: Secondary | ICD-10-CM

## 2021-03-08 DIAGNOSIS — J188 Other pneumonia, unspecified organism: Secondary | ICD-10-CM

## 2021-03-08 DIAGNOSIS — J9621 Acute and chronic respiratory failure with hypoxia: Secondary | ICD-10-CM

## 2021-03-08 DIAGNOSIS — I609 Nontraumatic subarachnoid hemorrhage, unspecified: Secondary | ICD-10-CM

## 2021-03-08 DIAGNOSIS — Z930 Tracheostomy status: Secondary | ICD-10-CM

## 2021-03-09 DIAGNOSIS — J188 Other pneumonia, unspecified organism: Secondary | ICD-10-CM

## 2021-03-09 DIAGNOSIS — Z930 Tracheostomy status: Secondary | ICD-10-CM

## 2021-03-09 DIAGNOSIS — I609 Nontraumatic subarachnoid hemorrhage, unspecified: Secondary | ICD-10-CM

## 2021-03-09 DIAGNOSIS — J9621 Acute and chronic respiratory failure with hypoxia: Secondary | ICD-10-CM

## 2021-03-10 DIAGNOSIS — I609 Nontraumatic subarachnoid hemorrhage, unspecified: Secondary | ICD-10-CM

## 2021-03-10 DIAGNOSIS — J188 Other pneumonia, unspecified organism: Secondary | ICD-10-CM

## 2021-03-10 DIAGNOSIS — J9621 Acute and chronic respiratory failure with hypoxia: Secondary | ICD-10-CM

## 2021-03-10 DIAGNOSIS — Z930 Tracheostomy status: Secondary | ICD-10-CM

## 2021-03-11 DIAGNOSIS — J188 Other pneumonia, unspecified organism: Secondary | ICD-10-CM

## 2021-03-11 DIAGNOSIS — J9621 Acute and chronic respiratory failure with hypoxia: Secondary | ICD-10-CM

## 2021-03-11 DIAGNOSIS — I609 Nontraumatic subarachnoid hemorrhage, unspecified: Secondary | ICD-10-CM

## 2021-03-11 DIAGNOSIS — Z930 Tracheostomy status: Secondary | ICD-10-CM

## 2021-03-12 DIAGNOSIS — J188 Other pneumonia, unspecified organism: Secondary | ICD-10-CM

## 2021-03-12 DIAGNOSIS — Z930 Tracheostomy status: Secondary | ICD-10-CM

## 2021-03-12 DIAGNOSIS — I609 Nontraumatic subarachnoid hemorrhage, unspecified: Secondary | ICD-10-CM

## 2021-03-12 DIAGNOSIS — J9621 Acute and chronic respiratory failure with hypoxia: Secondary | ICD-10-CM

## 2021-03-21 DIAGNOSIS — I609 Nontraumatic subarachnoid hemorrhage, unspecified: Secondary | ICD-10-CM

## 2021-03-21 DIAGNOSIS — J9621 Acute and chronic respiratory failure with hypoxia: Secondary | ICD-10-CM

## 2021-03-21 DIAGNOSIS — J188 Other pneumonia, unspecified organism: Secondary | ICD-10-CM

## 2021-03-21 DIAGNOSIS — Z930 Tracheostomy status: Secondary | ICD-10-CM

## 2021-03-22 DIAGNOSIS — J9621 Acute and chronic respiratory failure with hypoxia: Secondary | ICD-10-CM

## 2021-03-22 DIAGNOSIS — I609 Nontraumatic subarachnoid hemorrhage, unspecified: Secondary | ICD-10-CM

## 2021-03-22 DIAGNOSIS — Z930 Tracheostomy status: Secondary | ICD-10-CM

## 2021-03-22 DIAGNOSIS — J188 Other pneumonia, unspecified organism: Secondary | ICD-10-CM

## 2021-03-23 DIAGNOSIS — Z930 Tracheostomy status: Secondary | ICD-10-CM

## 2021-03-23 DIAGNOSIS — J188 Other pneumonia, unspecified organism: Secondary | ICD-10-CM

## 2021-03-23 DIAGNOSIS — I609 Nontraumatic subarachnoid hemorrhage, unspecified: Secondary | ICD-10-CM

## 2021-03-23 DIAGNOSIS — J9621 Acute and chronic respiratory failure with hypoxia: Secondary | ICD-10-CM

## 2021-03-24 DIAGNOSIS — I609 Nontraumatic subarachnoid hemorrhage, unspecified: Secondary | ICD-10-CM

## 2021-03-24 DIAGNOSIS — J188 Other pneumonia, unspecified organism: Secondary | ICD-10-CM

## 2021-03-24 DIAGNOSIS — Z930 Tracheostomy status: Secondary | ICD-10-CM

## 2021-03-24 DIAGNOSIS — J9621 Acute and chronic respiratory failure with hypoxia: Secondary | ICD-10-CM

## 2021-03-25 DIAGNOSIS — J9621 Acute and chronic respiratory failure with hypoxia: Secondary | ICD-10-CM

## 2021-03-25 DIAGNOSIS — I609 Nontraumatic subarachnoid hemorrhage, unspecified: Secondary | ICD-10-CM

## 2021-03-25 DIAGNOSIS — J188 Other pneumonia, unspecified organism: Secondary | ICD-10-CM

## 2021-03-25 DIAGNOSIS — Z930 Tracheostomy status: Secondary | ICD-10-CM

## 2021-03-26 DIAGNOSIS — J188 Other pneumonia, unspecified organism: Secondary | ICD-10-CM

## 2021-03-26 DIAGNOSIS — J9621 Acute and chronic respiratory failure with hypoxia: Secondary | ICD-10-CM

## 2021-03-26 DIAGNOSIS — Z930 Tracheostomy status: Secondary | ICD-10-CM

## 2021-03-26 DIAGNOSIS — I609 Nontraumatic subarachnoid hemorrhage, unspecified: Secondary | ICD-10-CM

## 2021-03-27 DIAGNOSIS — I609 Nontraumatic subarachnoid hemorrhage, unspecified: Secondary | ICD-10-CM

## 2021-03-27 DIAGNOSIS — Z930 Tracheostomy status: Secondary | ICD-10-CM

## 2021-03-27 DIAGNOSIS — J188 Other pneumonia, unspecified organism: Secondary | ICD-10-CM

## 2021-03-27 DIAGNOSIS — J9621 Acute and chronic respiratory failure with hypoxia: Secondary | ICD-10-CM

## 2021-03-28 DIAGNOSIS — J9621 Acute and chronic respiratory failure with hypoxia: Secondary | ICD-10-CM

## 2021-03-28 DIAGNOSIS — I609 Nontraumatic subarachnoid hemorrhage, unspecified: Secondary | ICD-10-CM

## 2021-03-28 DIAGNOSIS — Z930 Tracheostomy status: Secondary | ICD-10-CM

## 2021-03-28 DIAGNOSIS — J188 Other pneumonia, unspecified organism: Secondary | ICD-10-CM

## 2021-04-05 DIAGNOSIS — I609 Nontraumatic subarachnoid hemorrhage, unspecified: Secondary | ICD-10-CM

## 2021-04-05 DIAGNOSIS — J188 Other pneumonia, unspecified organism: Secondary | ICD-10-CM

## 2021-04-05 DIAGNOSIS — Z930 Tracheostomy status: Secondary | ICD-10-CM

## 2021-04-05 DIAGNOSIS — J9621 Acute and chronic respiratory failure with hypoxia: Secondary | ICD-10-CM

## 2021-04-06 DIAGNOSIS — Z930 Tracheostomy status: Secondary | ICD-10-CM

## 2021-04-06 DIAGNOSIS — I609 Nontraumatic subarachnoid hemorrhage, unspecified: Secondary | ICD-10-CM

## 2021-04-06 DIAGNOSIS — J9621 Acute and chronic respiratory failure with hypoxia: Secondary | ICD-10-CM

## 2021-04-06 DIAGNOSIS — J188 Other pneumonia, unspecified organism: Secondary | ICD-10-CM

## 2021-04-07 DIAGNOSIS — J9621 Acute and chronic respiratory failure with hypoxia: Secondary | ICD-10-CM

## 2021-04-07 DIAGNOSIS — Z930 Tracheostomy status: Secondary | ICD-10-CM

## 2021-04-07 DIAGNOSIS — J188 Other pneumonia, unspecified organism: Secondary | ICD-10-CM

## 2021-04-07 DIAGNOSIS — I609 Nontraumatic subarachnoid hemorrhage, unspecified: Secondary | ICD-10-CM

## 2021-05-05 DEATH — deceased
